# Patient Record
Sex: Female | Born: 1955
Health system: Southern US, Community
[De-identification: ages and names within clinical notes are randomized; demographics above are authoritative.]

## PROBLEM LIST (undated history)

## (undated) DIAGNOSIS — M1990 Unspecified osteoarthritis, unspecified site: Secondary | ICD-10-CM

## (undated) DIAGNOSIS — F32A Depression, unspecified: Secondary | ICD-10-CM

## (undated) DIAGNOSIS — C801 Malignant (primary) neoplasm, unspecified: Secondary | ICD-10-CM

## (undated) DIAGNOSIS — K219 Gastro-esophageal reflux disease without esophagitis: Secondary | ICD-10-CM

## (undated) DIAGNOSIS — F329 Major depressive disorder, single episode, unspecified: Secondary | ICD-10-CM

## (undated) DIAGNOSIS — E669 Obesity, unspecified: Secondary | ICD-10-CM

## (undated) DIAGNOSIS — M7500 Adhesive capsulitis of unspecified shoulder: Secondary | ICD-10-CM

## (undated) DIAGNOSIS — I10 Essential (primary) hypertension: Secondary | ICD-10-CM

## (undated) DIAGNOSIS — T7840XA Allergy, unspecified, initial encounter: Secondary | ICD-10-CM

## (undated) DIAGNOSIS — E119 Type 2 diabetes mellitus without complications: Secondary | ICD-10-CM

## (undated) DIAGNOSIS — J449 Chronic obstructive pulmonary disease, unspecified: Secondary | ICD-10-CM

## (undated) HISTORY — DX: Depression, unspecified: F32.A

## (undated) HISTORY — DX: Unspecified osteoarthritis, unspecified site: M19.90

## (undated) HISTORY — DX: Essential (primary) hypertension: I10

## (undated) HISTORY — DX: Chronic obstructive pulmonary disease, unspecified: J44.9

## (undated) HISTORY — DX: Obesity, unspecified: E66.9

## (undated) HISTORY — PX: LAPAROSCOPIC ABDOMINAL EXPLORATION: SHX6249

## (undated) HISTORY — DX: Adhesive capsulitis of unspecified shoulder: M75.00

## (undated) HISTORY — DX: Gastro-esophageal reflux disease without esophagitis: K21.9

## (undated) HISTORY — PX: OTHER SURGICAL HISTORY: SHX169

## (undated) HISTORY — DX: Allergy, unspecified, initial encounter: T78.40XA

## (undated) HISTORY — DX: Major depressive disorder, single episode, unspecified: F32.9

## (undated) HISTORY — DX: Type 2 diabetes mellitus without complications: E11.9

---

## 1997-12-17 ENCOUNTER — Inpatient Hospital Stay (HOSPITAL_COMMUNITY): Admission: AD | Admit: 1997-12-17 | Discharge: 1997-12-17 | Payer: Self-pay | Admitting: Obstetrics

## 1998-01-14 ENCOUNTER — Inpatient Hospital Stay (HOSPITAL_COMMUNITY): Admission: AD | Admit: 1998-01-14 | Discharge: 1998-01-14 | Payer: Self-pay | Admitting: Obstetrics

## 1998-12-30 ENCOUNTER — Emergency Department (HOSPITAL_COMMUNITY): Admission: EM | Admit: 1998-12-30 | Discharge: 1998-12-30 | Payer: Self-pay | Admitting: *Deleted

## 2013-06-02 ENCOUNTER — Other Ambulatory Visit: Payer: Self-pay | Admitting: Internal Medicine

## 2013-06-02 DIAGNOSIS — Z1231 Encounter for screening mammogram for malignant neoplasm of breast: Secondary | ICD-10-CM

## 2013-12-21 LAB — HM DIABETES EYE EXAM

## 2014-07-21 ENCOUNTER — Encounter: Payer: Self-pay | Admitting: Family Medicine

## 2014-07-21 ENCOUNTER — Ambulatory Visit (INDEPENDENT_AMBULATORY_CARE_PROVIDER_SITE_OTHER): Payer: No Typology Code available for payment source | Admitting: Family Medicine

## 2014-07-21 VITALS — BP 142/100 | HR 82 | Temp 98.4°F | Ht 67.0 in | Wt 259.5 lb

## 2014-07-21 DIAGNOSIS — I1A0 Resistant hypertension: Secondary | ICD-10-CM

## 2014-07-21 DIAGNOSIS — K219 Gastro-esophageal reflux disease without esophagitis: Secondary | ICD-10-CM

## 2014-07-21 DIAGNOSIS — Z7189 Other specified counseling: Secondary | ICD-10-CM

## 2014-07-21 DIAGNOSIS — F329 Major depressive disorder, single episode, unspecified: Secondary | ICD-10-CM

## 2014-07-21 DIAGNOSIS — Z23 Encounter for immunization: Secondary | ICD-10-CM

## 2014-07-21 DIAGNOSIS — F3289 Other specified depressive episodes: Secondary | ICD-10-CM

## 2014-07-21 DIAGNOSIS — F32A Depression, unspecified: Secondary | ICD-10-CM

## 2014-07-21 DIAGNOSIS — Z7689 Persons encountering health services in other specified circumstances: Secondary | ICD-10-CM

## 2014-07-21 DIAGNOSIS — I10 Essential (primary) hypertension: Secondary | ICD-10-CM

## 2014-07-21 DIAGNOSIS — M79601 Pain in right arm: Secondary | ICD-10-CM | POA: Insufficient documentation

## 2014-07-21 DIAGNOSIS — E669 Obesity, unspecified: Secondary | ICD-10-CM

## 2014-07-21 DIAGNOSIS — E119 Type 2 diabetes mellitus without complications: Secondary | ICD-10-CM

## 2014-07-21 DIAGNOSIS — M25519 Pain in unspecified shoulder: Secondary | ICD-10-CM

## 2014-07-21 HISTORY — DX: Type 2 diabetes mellitus without complications: E11.9

## 2014-07-21 HISTORY — DX: Resistant hypertension: I1A.0

## 2014-07-21 HISTORY — DX: Essential (primary) hypertension: I10

## 2014-07-21 HISTORY — DX: Gastro-esophageal reflux disease without esophagitis: K21.9

## 2014-07-21 HISTORY — DX: Obesity, unspecified: E66.9

## 2014-07-21 LAB — BASIC METABOLIC PANEL
BUN: 9 mg/dL (ref 6–23)
CALCIUM: 9.4 mg/dL (ref 8.4–10.5)
CO2: 30 mEq/L (ref 19–32)
Chloride: 98 mEq/L (ref 96–112)
Creatinine, Ser: 0.8 mg/dL (ref 0.4–1.2)
GFR: 91.84 mL/min (ref >60.00)
Glucose, Bld: 121 mg/dL — ABNORMAL HIGH (ref 70–99)
POTASSIUM: 3.5 meq/L (ref 3.5–5.1)
SODIUM: 136 meq/L (ref 135–145)

## 2014-07-21 LAB — LIPID PANEL
Cholesterol: 184 mg/dL (ref 0–200)
HDL: 59.4 mg/dL (ref >39.00)
LDL CALC: 106 mg/dL — AB (ref 0–99)
NONHDL: 124.6
Total CHOL/HDL Ratio: 3
Triglycerides: 92 mg/dL (ref 0.0–149.0)
VLDL: 18.4 mg/dL (ref 0.0–40.0)

## 2014-07-21 LAB — HEMOGLOBIN A1C: Hgb A1c MFr Bld: 6.7 % — ABNORMAL HIGH (ref 4.6–6.5)

## 2014-07-21 MED ORDER — OMEPRAZOLE 40 MG PO CPDR: 40.0000 mg | DELAYED_RELEASE_CAPSULE | Freq: Every day | ORAL | Status: DC

## 2014-07-21 MED ORDER — AMLODIPINE BESYLATE 10 MG PO TABS: 10.0000 mg | ORAL_TABLET | Freq: Every day | ORAL | Status: DC

## 2014-07-21 MED ORDER — CYCLOBENZAPRINE HCL 10 MG PO TABS: 10.0000 mg | ORAL_TABLET | Freq: Every evening | ORAL | Status: DC | PRN

## 2014-07-21 MED ORDER — FLUOXETINE HCL 20 MG PO TABS: 20.0000 mg | ORAL_TABLET | Freq: Two times a day (BID) | ORAL | Status: DC

## 2014-07-21 MED ORDER — LORATADINE 10 MG PO TABS: 10.0000 mg | ORAL_TABLET | Freq: Every day | ORAL | Status: DC

## 2014-07-21 MED ORDER — METOPROLOL TARTRATE 50 MG PO TABS: 50.0000 mg | ORAL_TABLET | Freq: Every day | ORAL | Status: DC

## 2014-07-21 MED ORDER — LOSARTAN POTASSIUM-HCTZ 100-25 MG PO TABS
1.0000 | ORAL_TABLET | Freq: Every day | ORAL | Status: DC
Start: 2014-07-21 — End: 2015-02-03

## 2014-07-21 NOTE — Progress Notes (Signed)
No chief complaint on file.   HPI:  Yvette Roberts is here to establish care.  Has had several family doctors - but not happy with them because per her report they did not help her blood pressure. She has been under a lot of stress with mother's health.  Last PCP and physical: sees Dr. Ruthann Cancer at [facility] obgyn for yearly womens health, Had physical with family med in April.  Has the following chronic problems and concerns today:  Patient Active Problem List   Diagnosis Date Noted  . Type 2 diabetes mellitus without complication 53/61/4431  . Obesity, unspecified 07/21/2014  . Right arm pain 07/21/2014  . Depression 07/21/2014  . Resistant hypertension 07/21/2014  . Esophageal reflux 07/21/2014   HTN: -reports difficult to control -on losartan-hctz 100-25, metoprol 50mg  daily (ran out today), norvasc 10mg  -reports checks BP occ at home, 140/80 -FH HTN  -saw Dr. Gwenlyn Found in cardiology about this in 2013 per her report -denies: CP, SOB, DOE, palpitations, swelling -walking 3 days per week about 1 hour -low sodium, avoids pork, not a lot of red meat  Hx of diabetes: -dx with diabetes in 2012 -was on metformin but then taken off as improved with diet and exercise -no body every told her that cholesterol was a problem -Sees Dr. Einar Gip in optho  R shoulder pain: -occ uses flexeril use for this -takes mothers tramadol and flexeril 1-2 times per month -denies: weakness, numbness, change  Depression: -dx in 2008 -no hx of SI or hospitalization -on prozac since then, increased to 20mg  bid -depressed mood daily mild Depression Symptoms: Sleep disorder: yes Interest deficit/anhedonia: no Guilt (worthlessness, hopelessness, regret): no Energy deficit: no Concentration deficit: no Appetite disorder: no Psychomotor retardation or agitation: no Suicidality: no Irritable at times No counseling  GERD: -wants to start prilosec - was on nexium and did not like it -GERD  symptoms daily -no dysphagia   [name] negative for unless reported above: fevers, unintentional weight loss, hearing or vision loss, chest pain, palpitations, struggling to breath, hemoptysis, melena, hematochezia, hematuria, falls, loc, si, thoughts of self harm  Past Medical History  Diagnosis Date  . Depression   . Hypertension   . Arthritis   . History of elevated glucose   . Allergy   . Type 2 diabetes mellitus without complication 5/40/0867  . Resistant hypertension 07/21/2014  . Obesity, unspecified 07/21/2014  . Esophageal reflux 07/21/2014    No family history on file.  History   Social History  . Marital Status: Divorced    Spouse Name: N/A    Number of Children: N/A  . Years of Education: N/A   Social History Main Topics  . Smoking status: Never Smoker   . Smokeless tobacco: None  . Alcohol Use: None  . Drug Use: None  . Sexual Activity: None   Other Topics Concern  . 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      Home Situation: lives with mother      Spiritual Beliefs: baptist      Lifestyle: regular exercise; diet is good             Current outpatient prescriptions:amLODipine (NORVASC) 10 MG tablet, Take 10 mg by mouth daily., Disp: , Rfl: ;  aspirin 81 MG tablet, Take 81 mg by mouth daily., Disp: , Rfl: ;  cyclobenzaprine (FLEXERIL) 10 MG tablet, Take 10 mg by mouth at bedtime as needed for muscle  spasms., Disp: , Rfl: ;  FLUoxetine (PROZAC) 20 MG capsule, Take 20 mg by mouth daily., Disp: , Rfl: ;  loratadine (CLARITIN) 10 MG tablet, Take 10 mg by mouth daily., Disp: , Rfl:  losartan-hydrochlorothiazide (HYZAAR) 100-25 MG per tablet, Take 1 tablet by mouth daily., Disp: , Rfl: ;  metoprolol (LOPRESSOR) 50 MG tablet, Take 50 mg by mouth daily., Disp: , Rfl:   EXAM:  Filed Vitals:   07/21/14 0923  BP: 142/100  Pulse: 82  Temp: 98.4 F (36.9 C)    Body mass index is 40.63 kg/(m^2).  GENERAL: vitals  reviewed and listed above, alert, oriented, appears well hydrated and in no acute distress  HEENT: atraumatic, conjunttiva clear, no obvious abnormalities on inspection of external nose and ears  NECK: no obvious masses on inspection  LUNGS: clear to auscultation bilaterally, no wheezes, rales or rhonchi, good air movement  CV: HRRR, no peripheral edema  MS: moves all extremities without noticeable abnormality  PSYCH: pleasant and cooperative, no obvious depression or anxiety  ASSESSMENT AND PLAN:  Discussed the following assessment and plan:  Encounter to establish care  Type 2 diabetes mellitus without complication - Plan: Basic metabolic panel, Hemoglobin A1c  Obesity, unspecified - Plan: Lipid panel FASTING labs today  Essential hypertension, benign - Plan: Basic metabolic panel -she did not take her metoprolol today and htinks this is why BP is up, rechecked after sitting and still up -advised starting another medication or changing medications and discussed options - she refused at this time -she opted to restart metoprolol and monitor at home -I advised CLOSE follow up with cuff, log and to recheck in 2 weeks then change medication if still not at goal -advised if we can not help her get her BP to goal then will refer her to a specialist for managment  Right arm pain  Depression -advised counseling, refused as reports does not have time -offered increasing medication, declined -monitor  Flu, pneumococcal, tdap   -We reviewed the PMH, PSH, FH, SH, Meds and Allergies. -We provided refills for any medications we will prescribe as needed. -We addressed current concerns per orders and patient instructions. -We have asked for records for pertinent exams, studies, vaccines and notes from previous providers. -We have advised patient to follow up per instructions below.   -Patient advised to return or notify a doctor immediately if symptoms worsen or persist or new  concerns arise.  Patient Instructions  -We have ordered labs or studies at this visit. It can take up to 1-2 weeks for results and processing. We will contact you with instructions IF your results are abnormal. Normal results will be released to your [facility]. If you have not heard from [place] or can not find your results in [facility] in 2 weeks please contact our office.  -PLEASE SIGN UP FOR MYCHART TODAY   -restart the metoprolol  -start omeprazole (Prilosec) 40 mg daily  -check blood pressure 3 times per week after sitting for 5 minutes and keep log  -follow up in 2-3 weeks - may start a medication called clonidine if needed  We recommend the following healthy lifestyle measures: - eat a healthy diet consisting of lots of vegetables, fruits, beans, nuts, seeds, healthy meats such as white chicken and fish and whole grains.  - avoid fried foods, fast food, processed foods, sodas, red meet and other fattening foods.  - get a least 150 minutes of aerobic exercise per week.   Follow up in: 2-3 weeks  Colin Benton R.

## 2014-07-21 NOTE — Addendum Note (Signed)
Addended by: Agnes Lawrence on: 07/21/2014 11:14 AM   Modules accepted: Orders

## 2014-07-21 NOTE — Progress Notes (Signed)
Pre visit review using our clinic review tool, if applicable. No additional management support is needed unless otherwise documented below in the visit note. 

## 2014-07-21 NOTE — Patient Instructions (Addendum)
-  We have ordered labs or studies at this visit. It can take up to 1-2 weeks for results and processing. We will contact you with instructions IF your results are abnormal. Normal results will be released to your [facility]. If you have not heard from [place] or can not find your results in [facility] in 2 weeks please contact our office.  -PLEASE SIGN UP FOR MYCHART TODAY   -restart the metoprolol  -start omeprazole (Prilosec) 40 mg daily  -check blood pressure 3 times per week after sitting for 5 minutes and keep log  -follow up in 2-3 weeks - may start a medication called clonidine if needed  We recommend the following healthy lifestyle measures: - eat a healthy diet consisting of lots of vegetables, fruits, beans, nuts, seeds, healthy meats such as white chicken and fish and whole grains.  - avoid fried foods, fast food, processed foods, sodas, red meet and other fattening foods.  - get a least 150 minutes of aerobic exercise per week.   Follow up in: 2-3 weeks

## 2014-07-21 NOTE — Addendum Note (Signed)
Addended by: Agnes Lawrence on: 07/21/2014 11:08 AM   Modules accepted: Orders

## 2014-07-22 NOTE — Addendum Note (Signed)
Addended by: Agnes Lawrence on: 07/22/2014 09:06 AM   Modules accepted: Orders

## 2014-08-03 ENCOUNTER — Ambulatory Visit: Payer: No Typology Code available for payment source | Admitting: *Deleted

## 2014-08-04 ENCOUNTER — Encounter: Payer: Self-pay | Admitting: Family Medicine

## 2014-08-04 ENCOUNTER — Ambulatory Visit: Payer: No Typology Code available for payment source | Admitting: Family Medicine

## 2014-08-04 ENCOUNTER — Ambulatory Visit (INDEPENDENT_AMBULATORY_CARE_PROVIDER_SITE_OTHER): Payer: No Typology Code available for payment source | Admitting: Family Medicine

## 2014-08-04 VITALS — BP 136/86 | HR 64 | Temp 98.4°F | Ht 67.0 in | Wt 259.0 lb

## 2014-08-04 DIAGNOSIS — M79601 Pain in right arm: Secondary | ICD-10-CM

## 2014-08-04 DIAGNOSIS — K219 Gastro-esophageal reflux disease without esophagitis: Secondary | ICD-10-CM

## 2014-08-04 DIAGNOSIS — E119 Type 2 diabetes mellitus without complications: Secondary | ICD-10-CM

## 2014-08-04 DIAGNOSIS — E669 Obesity, unspecified: Secondary | ICD-10-CM

## 2014-08-04 DIAGNOSIS — I10 Essential (primary) hypertension: Secondary | ICD-10-CM

## 2014-08-04 DIAGNOSIS — F32A Depression, unspecified: Secondary | ICD-10-CM

## 2014-08-04 DIAGNOSIS — F329 Major depressive disorder, single episode, unspecified: Secondary | ICD-10-CM

## 2014-08-04 NOTE — Progress Notes (Signed)
No chief complaint on file.   HPI:  Yvette Roberts recently established care. Has had several family doctors - but not happy with them because per her report they did not help her blood pressure. She has been under a lot of stress with mother's health.   Last PCP and physical: sees Dr. Ruthann Cancer at [facility] obgyn for yearly womens health, Had physical with family med in April 2015.  Has the following chronic problems and concerns today:   HTN:  -she is happy this is improving -on losartan-hctz 100-25, metoprol 50mg , norvasc 10mg   -FH HTN  -saw Dr. Gwenlyn Found in cardiology about this in 2013 per her report  -denies: CP, SOB, DOE, palpitations, swelling  -working on diet and exercise -low sodium, avoids pork, not a lot of red meat   Hx of diabetes:  -dx with diabetes in 2012  -was on metformin but then taken off as improved with diet and exercise  -Sees Dr. Einar Gip in optho   R shoulder pain:  -occ uses flexeril use for this  -takes mothers tramadol and flexeril 1-2 times per month  -denies: weakness, numbness, change   Depression:  -dx in 2008  -no hx of SI or hospitalization  -on prozac since then, increased to 20mg  bid  -improved, no SI or thoughts of self harm  GERD:  -started Prilosec dialy last visit, improved -occ gas after dairy products -no dysphagia, persistent abd pain, change in bowels  [name]: See pertinent positives and negatives per HPI.  Past Medical History  Diagnosis Date  . Depression   . Arthritis   . Allergy   . Type 2 diabetes mellitus without complication 1/61/0960  . Resistant hypertension 07/21/2014  . Obesity, unspecified 07/21/2014  . Esophageal reflux 07/21/2014    Past Surgical History  Procedure Laterality Date  . Laparoscopic abdominal exploration    . Miscarr      No family history on file.  History   Social History  . Marital Status: Divorced    Spouse Name: N/A    Number of Children: N/A  . Years of Education: N/A   Social  History Main Topics  . Smoking status: Never Smoker   . Smokeless tobacco: None  . Alcohol Use: None  . Drug Use: None  . Sexual Activity: None   Other Topics Concern  . 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      Home Situation: lives with mother      Spiritual Beliefs: baptist      Lifestyle: regular exercise; diet is good             Current outpatient prescriptions:amLODipine (NORVASC) 10 MG tablet, Take 1 tablet (10 mg total) by mouth daily., Disp: 90 tablet, Rfl: 3;  aspirin 81 MG tablet, Take 81 mg by mouth daily., Disp: , Rfl: ;  cyclobenzaprine (FLEXERIL) 10 MG tablet, Take 1 tablet (10 mg total) by mouth at bedtime as needed for muscle spasms., Disp: 90 tablet, Rfl: 3 FLUoxetine (PROZAC) 20 MG tablet, Take 1 tablet (20 mg total) by mouth 2 (two) times daily., Disp: 180 tablet, Rfl: 3;  loratadine (CLARITIN) 10 MG tablet, Take 1 tablet (10 mg total) by mouth daily., Disp: 90 tablet, Rfl: 3;  losartan-hydrochlorothiazide (HYZAAR) 100-25 MG per tablet, Take 1 tablet by mouth daily., Disp: 90 tablet, Rfl: 3;  metoprolol (LOPRESSOR) 50 MG tablet, Take 1 tablet (50 mg total) by mouth daily., Disp: 90 tablet,  Rfl: 3 omeprazole (PRILOSEC) 40 MG capsule, Take 1 capsule (40 mg total) by mouth daily., Disp: 90 capsule, Rfl: 3  EXAM:  Filed Vitals:   08/04/14 0935  BP: 136/86  Pulse: 64  Temp: 98.4 F (36.9 C)    Body mass index is 40.56 kg/(m^2).  GENERAL: vitals reviewed and listed above, alert, oriented, appears well hydrated and in no acute distress  HEENT: atraumatic, conjunttiva clear, no obvious abnormalities on inspection of external nose and ears  NECK: no obvious masses on inspection  LUNGS: clear to auscultation bilaterally, no wheezes, rales or rhonchi, good air movement  ABD: BS+, soft, NTTP  CV: HRRR, no peripheral edema  MS: moves all extremities without noticeable abnormality  PSYCH: pleasant and  cooperative, no obvious depression or anxiety  ASSESSMENT AND PLAN:  Discussed the following assessment and plan:  Type 2 diabetes mellitus without complication -she is working on diet and exercise for this -follow up in 3 months  Obesity -stressed importance of healthy diet and regular exercise  Right arm pain -tylenol, follow up if persists or worsens  Depression -improved  Resistant hypertension -improved, continue current medications, diet and exercise  Gastroesophageal reflux disease, esophagitis presence not specified  -Patient advised to return or notify a doctor immediately if symptoms worsen or persist or new concerns arise.  Patient Instructions  -continue to work on getting at least 150 minutes of cardiovascular exercise daily and eat a healthy diet  -can use tylenol 500-1000mg  up to twice daily for the shoulder and let us know if not working  -please let us know if gas not improving  -schedule morning appointment in 3 months and come fasting to that appointment and we will check labs that day     KIM, Jarrett Soho R.

## 2014-08-04 NOTE — Progress Notes (Signed)
Pre visit review using our clinic review tool, if applicable. No additional management support is needed unless otherwise documented below in the visit note. 

## 2014-08-04 NOTE — Patient Instructions (Signed)
-  continue to work on getting at least 150 minutes of cardiovascular exercise daily and eat a healthy diet  -can use tylenol 500-1000mg  up to twice daily for the shoulder and let us know if not working  -please let us know if gas not improving  -schedule morning appointment in 3 months and come fasting to that appointment and we will check labs that day

## 2014-08-05 ENCOUNTER — Telehealth: Payer: Self-pay | Admitting: Family Medicine

## 2014-08-05 NOTE — Telephone Encounter (Signed)
emmi mailed  °

## 2014-08-08 ENCOUNTER — Telehealth: Payer: Self-pay | Admitting: Family Medicine

## 2014-08-08 NOTE — Telephone Encounter (Signed)
Patient Information:  Caller Name: Johnae  Phone: [phone]  Patient: Yvette Roberts, Yvette Roberts  Gender: Female  DOB: Aug 18, 1956  Age: 58 Years  PCP: Maudie Mercury (TEXT 1st, after 20 mins can call), Jarrett Soho [facility])  Office Follow Up:  Does the office need to follow up with this patient?: No  Instructions For The Office: N/A  RN Note:  Advised Simple Saline, salt water gargles, cough drops, and Coricidin HB Cough and Cold  Symptoms  Reason For Call & Symptoms: Calling about Cold sx; runny nose- watery eyes, congestion, scratchy throat, occasional dry cough- onset 08/05/14. Afebrile. She had flu and pneumonia shot 2 weeks ago. She is wondering what she can take that will help with sx.  Reviewed Health History In EMR: Yes  Reviewed Medications In EMR: Yes  Reviewed Allergies In EMR: Yes  Reviewed Surgeries / Procedures: Yes  Date of Onset of Symptoms: 08/05/2014  Treatments Tried: Tylenol 500 mgs 2 tabs PO every 6hr  Treatments Tried Worked: No  Guideline(s) Used:  Colds  Disposition Per Guideline:   Home Care  Reason For Disposition Reached:   Colds with no complications  Advice Given:  Reassurance  It sounds like an uncomplicated cold that we can treat at home.  Colds are very common and may make you feel uncomfortable.  Colds are caused by viruses, and no medicine or "shot" will cure an uncomplicated cold.  Treatment for Associated Symptoms of Colds:  Sore throat: Try throat lozenges, hard candy, or warm chicken broth.  Cough: Use cough drops.  For a Runny Nose With Profuse Discharge:   Nasal mucus and discharge helps to wash viruses and bacteria out of the nose and sinuses.  Blowing the nose is all that is needed.  If the skin around your nostrils gets irritated, apply a tiny amount of petroleum ointment to the nasal openings once or twice a day.  For a Stuffy Nose - Use Nasal Washes:  Introduction: Saline (salt water) nasal irrigation (nasal wash) is an effective and simple  home remedy for treating stuffy nose and sinus congestion. The nose can be irrigated by pouring, spraying, or squirting salt water into the nose and then letting it run back out.  How it Helps: The salt water rinses out excess mucus, washes out any irritants (dust, allergens) that might be present, and moistens the nasal cavity.  Humidifier:  If the air in your home is dry, use a cool-mist humidifier  Contagiousness:  The cold virus is present in your nasal secretions.  Cover your nose and mouth with a tissue when you sneeze or cough.  Wash your hands frequently with soap and water.  You can return to work or school after the fever is gone and you feel well enough to participate in normal activities.  Expected Course:   Fever may last 2-3 days  Nasal discharge 7-14 days  Cough up to 2-3 weeks.  Call Back If:  Difficulty breathing occurs  Fever lasts more than 3 days  Nasal discharge lasts more than 10 days  Cough lasts more than 3 weeks  You become worse  Caution - Nasal Decongestants:  Do not take these medications if you have high blood pressure, heart disease, prostate problems, or an overactive thyroid.  Do not take these medications if you have used an MAO inhibitor such as isocarboxazid (Marplan), phenelzine (Nardil), rasagiline (Azilect), selegiline (Eldepryl, Emsam), or tranylcypromine (Parnate) in the past 2 weeks. Life-threatening side effects can occur.  Cough Medicines:  OTC  Cough Drops: Cough drops can help a lot, especially for mild coughs. They reduce coughing by soothing your irritated throat and removing that tickle sensation in the back of the throat. Cough drops also have the advantage of portability - you can carry them with you.  Pain and Fever Medicines:  For pain or fever relief, take either acetaminophen or ibuprofen.  Patient Will Follow Care Advice:  YES

## 2014-08-08 NOTE — Telephone Encounter (Signed)
Noted  

## 2014-11-04 ENCOUNTER — Ambulatory Visit (INDEPENDENT_AMBULATORY_CARE_PROVIDER_SITE_OTHER): Payer: 59 | Admitting: Family Medicine

## 2014-11-04 ENCOUNTER — Encounter: Payer: Self-pay | Admitting: Family Medicine

## 2014-11-04 VITALS — BP 122/84 | HR 62 | Temp 98.0°F | Ht 67.0 in | Wt 253.0 lb

## 2014-11-04 DIAGNOSIS — E669 Obesity, unspecified: Secondary | ICD-10-CM

## 2014-11-04 DIAGNOSIS — K219 Gastro-esophageal reflux disease without esophagitis: Secondary | ICD-10-CM

## 2014-11-04 DIAGNOSIS — E119 Type 2 diabetes mellitus without complications: Secondary | ICD-10-CM

## 2014-11-04 DIAGNOSIS — I10 Essential (primary) hypertension: Secondary | ICD-10-CM

## 2014-11-04 DIAGNOSIS — F329 Major depressive disorder, single episode, unspecified: Secondary | ICD-10-CM

## 2014-11-04 DIAGNOSIS — F32A Depression, unspecified: Secondary | ICD-10-CM

## 2014-11-04 DIAGNOSIS — L509 Urticaria, unspecified: Secondary | ICD-10-CM

## 2014-11-04 LAB — BASIC METABOLIC PANEL
BUN: 7 mg/dL (ref 6–23)
CO2: 31 mEq/L (ref 19–32)
Calcium: 8.9 mg/dL (ref 8.4–10.5)
Chloride: 98 mEq/L (ref 96–112)
Creatinine, Ser: 0.8 mg/dL (ref 0.4–1.2)
GFR: 97.2 mL/min (ref >60.00)
Glucose, Bld: 120 mg/dL — ABNORMAL HIGH (ref 70–99)
Potassium: 3.1 mEq/L — ABNORMAL LOW (ref 3.5–5.1)
SODIUM: 137 meq/L (ref 135–145)

## 2014-11-04 LAB — LIPID PANEL
Cholesterol: 191 mg/dL (ref 0–200)
HDL: 47.6 mg/dL (ref >39.00)
LDL CALC: 125 mg/dL — AB (ref 0–99)
NonHDL: 143.4
Total CHOL/HDL Ratio: 4
Triglycerides: 91 mg/dL (ref 0.0–149.0)
VLDL: 18.2 mg/dL (ref 0.0–40.0)

## 2014-11-04 LAB — HEMOGLOBIN A1C: Hgb A1c MFr Bld: 6.9 % — ABNORMAL HIGH (ref 4.6–6.5)

## 2014-11-04 NOTE — Progress Notes (Signed)
Pre visit review using our clinic review tool, if applicable. No additional management support is needed unless otherwise documented below in the visit note. 

## 2014-11-04 NOTE — Addendum Note (Signed)
Addended by: [name] on: 11/04/2014 09:29 AM   Modules accepted: Orders

## 2014-11-04 NOTE — Progress Notes (Signed)
HPI:  HTN:  -meds: losartan-hctz 100-25, metoprol 50mg , norvasc 10mg   -FH HTN  -saw Dr. Gwenlyn Found in cardiology about this in 2013 per her report  -denies: CP, SOB, DOE, palpitations, swelling  -working on diet and exercise -low sodium, avoids pork, not a lot of red meat   Hx of diabetes:  -dx with diabetes in 2012  -was on metformin but then taken off as improved with diet and exercise  -Sees Dr. Einar Gip in optho  -denies: polyuria, polydipsia, wounds, vision changes  R shoulder pain:  -occ uses flexeril use for this  -takes mothers tramadol and flexeril 1-2 times per month  -denies: weakness, numbness, change   Depression/anxiety:  -dx in 2008  -no hx of SI or hospitalization  -on prozac since then, increased to 20mg  bid  -improved, no SI or thoughts of self harm  GERD:  -meds prilosec daily -occ gas after dairy products -no dysphagia, persistent abd pain, change in bowels  Hives: -several months ago, hive - itchy welps -mother allergic to peanuts -taking benadryl and zyrtec - thinks was food related -no outbreaks since -no hx urticaria in the past, no hx allergy testing   [name]: See pertinent positives and negatives per HPI.  Past Medical History  Diagnosis Date  . Depression   . Arthritis   . Allergy   . Type 2 diabetes mellitus without complication 06/08/7516  . Resistant hypertension 07/21/2014  . Obesity, unspecified 07/21/2014  . Esophageal reflux 07/21/2014    Past Surgical History  Procedure Laterality Date  . Laparoscopic abdominal exploration    . Miscarr      No family history on file.  History   Social History  . Marital Status: Divorced    Spouse Name: N/A    Number of Children: N/A  . Years of Education: N/A   Social History Main Topics  . Smoking status: Never Smoker   . Smokeless tobacco: None  . Alcohol Use: None  . Drug Use: None  . Sexual Activity: None   Other Topics Concern  . 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      Home Situation: lives with mother      Spiritual Beliefs: baptist      Lifestyle: regular exercise; diet is good             Current outpatient prescriptions: amLODipine (NORVASC) 10 MG tablet, Take 1 tablet (10 mg total) by mouth daily., Disp: 90 tablet, Rfl: 3;  aspirin 81 MG tablet, Take 81 mg by mouth daily., Disp: , Rfl: ;  cyclobenzaprine (FLEXERIL) 10 MG tablet, Take 1 tablet (10 mg total) by mouth at bedtime as needed for muscle spasms., Disp: 90 tablet, Rfl: 3 FLUoxetine (PROZAC) 20 MG tablet, Take 1 tablet (20 mg total) by mouth 2 (two) times daily., Disp: 180 tablet, Rfl: 3;  losartan-hydrochlorothiazide (HYZAAR) 100-25 MG per tablet, Take 1 tablet by mouth daily., Disp: 90 tablet, Rfl: 3;  metoprolol (LOPRESSOR) 50 MG tablet, Take 1 tablet (50 mg total) by mouth daily., Disp: 90 tablet, Rfl: 3;  omeprazole (PRILOSEC) 40 MG capsule, Take 1 capsule (40 mg total) by mouth daily., Disp: 90 capsule, Rfl: 3  EXAM:  Filed Vitals:   11/04/14 0846  BP: 122/84  Pulse: 62  Temp: 98 F (36.7 C)    Body mass index is 39.62 kg/(m^2).  GENERAL: vitals reviewed and listed above, alert, oriented, appears well hydrated and in no  acute distress  HEENT: atraumatic, conjunttiva clear, no obvious abnormalities on inspection of external nose and ears  NECK: no obvious masses on inspection  LUNGS: clear to auscultation bilaterally, no wheezes, rales or rhonchi, good air movement  CV: HRRR, no peripheral edema  MS: moves all extremities without noticeable abnormality  PSYCH: pleasant and cooperative, no obvious depression or anxiety  ASSESSMENT AND PLAN:  Discussed the following assessment and plan:  Type 2 diabetes mellitus without complication  Obesity  Depression  Resistant hypertension  Gastroesophageal reflux disease, esophagitis presence not specified  Hives  -FASTING LABs -foot exam -HM:  colonoscopy, mammogram and pap advised - reports mammo and pap done with gyn in last year - updated per her report -lifestyle recs -follow up in 3 months -zyrtec daily and allergy testing for hx ? Hives, emergent precautions -Patient advised to return or notify a doctor immediately if symptoms worsen or persist or new concerns arise.  Patient Instructions  BEFORE YOU LEAVE: -labs   Call and set up appointment with allergist for testing  Follow up in 3 months  Let us know when you want to do your colonoscopy      Colin Benton R.

## 2014-11-04 NOTE — Addendum Note (Signed)
Addended by: Elmer Picker on: 11/04/2014 09:31 AM   Modules accepted: Orders

## 2014-11-04 NOTE — Patient Instructions (Signed)
BEFORE YOU LEAVE: -labs   Call and set up appointment with allergist for testing  Follow up in 3 months  Let us know when you want to do your colonoscopy

## 2014-11-07 ENCOUNTER — Other Ambulatory Visit: Payer: Self-pay | Admitting: *Deleted

## 2014-11-07 DIAGNOSIS — E876 Hypokalemia: Secondary | ICD-10-CM

## 2014-12-08 ENCOUNTER — Other Ambulatory Visit (INDEPENDENT_AMBULATORY_CARE_PROVIDER_SITE_OTHER): Payer: 59

## 2014-12-08 DIAGNOSIS — E876 Hypokalemia: Secondary | ICD-10-CM

## 2014-12-08 LAB — BASIC METABOLIC PANEL
BUN: 11 mg/dL (ref 6–23)
CHLORIDE: 99 meq/L (ref 96–112)
CO2: 34 mEq/L — ABNORMAL HIGH (ref 19–32)
Calcium: 9 mg/dL (ref 8.4–10.5)
Creatinine, Ser: 0.76 mg/dL (ref 0.40–1.20)
GFR: 100.12 mL/min (ref >60.00)
Glucose, Bld: 126 mg/dL — ABNORMAL HIGH (ref 70–99)
POTASSIUM: 3 meq/L — AB (ref 3.5–5.1)
Sodium: 137 mEq/L (ref 135–145)

## 2014-12-12 ENCOUNTER — Other Ambulatory Visit: Payer: Self-pay | Admitting: Family Medicine

## 2014-12-12 ENCOUNTER — Other Ambulatory Visit: Payer: Self-pay | Admitting: *Deleted

## 2014-12-12 DIAGNOSIS — E876 Hypokalemia: Secondary | ICD-10-CM

## 2014-12-12 MED ORDER — POTASSIUM CHLORIDE ER 20 MEQ PO TBCR: 20.0000 meq | EXTENDED_RELEASE_TABLET | Freq: Every day | ORAL | Status: DC

## 2015-01-10 ENCOUNTER — Other Ambulatory Visit: Payer: 59

## 2015-01-17 ENCOUNTER — Other Ambulatory Visit (INDEPENDENT_AMBULATORY_CARE_PROVIDER_SITE_OTHER): Payer: 59

## 2015-01-17 DIAGNOSIS — E876 Hypokalemia: Secondary | ICD-10-CM

## 2015-01-17 LAB — BASIC METABOLIC PANEL
BUN: 8 mg/dL (ref 6–23)
CO2: 33 mEq/L — ABNORMAL HIGH (ref 19–32)
Calcium: 9.1 mg/dL (ref 8.4–10.5)
Chloride: 100 mEq/L (ref 96–112)
Creatinine, Ser: 0.76 mg/dL (ref 0.40–1.20)
GFR: 100.09 mL/min (ref >60.00)
GLUCOSE: 123 mg/dL — AB (ref 70–99)
POTASSIUM: 3.3 meq/L — AB (ref 3.5–5.1)
SODIUM: 137 meq/L (ref 135–145)

## 2015-02-03 ENCOUNTER — Ambulatory Visit (INDEPENDENT_AMBULATORY_CARE_PROVIDER_SITE_OTHER): Payer: 59 | Admitting: Family Medicine

## 2015-02-03 ENCOUNTER — Encounter: Payer: Self-pay | Admitting: Family Medicine

## 2015-02-03 VITALS — BP 138/86 | HR 62 | Temp 98.3°F | Ht 67.0 in | Wt 254.5 lb

## 2015-02-03 DIAGNOSIS — E119 Type 2 diabetes mellitus without complications: Secondary | ICD-10-CM

## 2015-02-03 DIAGNOSIS — E669 Obesity, unspecified: Secondary | ICD-10-CM

## 2015-02-03 DIAGNOSIS — M25512 Pain in left shoulder: Secondary | ICD-10-CM

## 2015-02-03 DIAGNOSIS — F329 Major depressive disorder, single episode, unspecified: Secondary | ICD-10-CM

## 2015-02-03 DIAGNOSIS — F32A Depression, unspecified: Secondary | ICD-10-CM

## 2015-02-03 DIAGNOSIS — I10 Essential (primary) hypertension: Secondary | ICD-10-CM | POA: Diagnosis not present

## 2015-02-03 DIAGNOSIS — M25511 Pain in right shoulder: Secondary | ICD-10-CM

## 2015-02-03 MED ORDER — FLUOXETINE HCL 20 MG PO TABS
40.0000 mg | ORAL_TABLET | Freq: Every day | ORAL | Status: DC
Start: 2015-02-03 — End: 2016-02-07

## 2015-02-03 MED ORDER — CARVEDILOL 6.25 MG PO TABS: 6.2500 mg | ORAL_TABLET | Freq: Two times a day (BID) | ORAL | Status: DC

## 2015-02-03 MED ORDER — LOSARTAN POTASSIUM 100 MG PO TABS: 100.0000 mg | ORAL_TABLET | Freq: Every day | ORAL | Status: DC

## 2015-02-03 NOTE — Progress Notes (Signed)
HPI:  HTN:  -meds: losartan-hctz 100-25, metoprol 50mg , norvasc 10mg   -FH HTN  -saw Dr. Gwenlyn Found in cardiology about this in 2013 per her report  -denies: CP, SOB, DOE, palpitations, swelling  -working on diet and exercise -low sodium, avoids pork, not a lot of red meat  -had some hypokal on hctz and today reports wants to stop the hctz as doesn't like taking potassium  HLD: -advised statin discussion this visit and lifestyle recs -she prefers to do aggressive lifestyle changes and then start cholesterol -denies hx os statin intol  Hx of diabetes:  -dx with diabetes in 2012  -was on metformin but then taken off as improved with diet and exercise  -Sees Dr. Einar Gip in optho  -denies: polyuria, polydipsia, wounds, vision changes  R shoulder pain:  -now worsening and now in both shoulders, has intermittent flares of pain in bilat shoulders, worse with combing hair and raising arms -occ uses flexeril use for this  -uses tylenol 500mg  and this helps some -denies: weakness, numbness, fevers, other joint issues  Depression/anxiety:  -dx in 2008  -no hx of SI or hospitalization  -on prozac since then, increased to 20mg  bid  -improved, no SI or thoughts of self harm  GERD:  -meds prilosec daily -occ gas after dairy products -no dysphagia, persistent abd pain, change in bowels  Hives: -several months ago, hive - itchy welps -mother allergic to peanuts -taking benadryl and zyrtec - thinks was food related -no outbreaks since -no hx urticaria in the past, no hx allergy testing   [name]: See pertinent positives and negatives per HPI.  Past Medical History  Diagnosis Date  . Depression   . Arthritis   . Allergy   . Type 2 diabetes mellitus without complication 5/95/6387  . Resistant hypertension 07/21/2014  . Obesity, unspecified 07/21/2014  . Esophageal reflux 07/21/2014    Past Surgical History  Procedure Laterality Date  . Laparoscopic abdominal  exploration    . Miscarr      No family history on file.  History   Social History  . Marital Status: Divorced    Spouse Name: N/A  . Number of Children: N/A  . Years of Education: N/A   Social History Main Topics  . Smoking status: Never Smoker   . Smokeless tobacco: Not on file  . Alcohol Use: Not on file  . Drug Use: Not on file  . Sexual Activity: Not on file   Other Topics Concern  . 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  amLODipine (NORVASC) 10 MG tablet, Take 1 tablet (10 mg total) by mouth daily., Disp: 90 tablet, Rfl: 3 .  aspirin 81 MG tablet, Take 81 mg by mouth daily., Disp: , Rfl:  .  cyclobenzaprine (FLEXERIL) 10 MG tablet, Take 1 tablet (10 mg total) by mouth at bedtime as needed for muscle spasms., Disp: 90 tablet, Rfl: 3 .  FLUoxetine (PROZAC) 20 MG tablet, Take 2 tablets (40 mg total) by mouth daily., Disp: 180 tablet, Rfl: 3 .  omeprazole (PRILOSEC) 40 MG capsule, Take 1 capsule (40 mg total) by mouth daily., Disp: 90 capsule, Rfl: 3 .  carvedilol (COREG) 6.25  MG tablet, Take 1 tablet (6.25 mg total) by mouth 2 (two) times daily with a meal., Disp: 60 tablet, Rfl: 3 .  losartan (COZAAR) 100 MG tablet, Take 1 tablet (100 mg total) by mouth daily., Disp: 90 tablet, Rfl: 1  EXAM:  Filed Vitals:   02/03/15 0851  BP: 138/86  Pulse: 62  Temp: 98.3 F (36.8 C)    Body mass index is 39.85 kg/(m^2).  GENERAL: vitals reviewed and listed above, alert, oriented, appears well hydrated and in no acute distress  HEENT: atraumatic, conjunttiva clear, no obvious abnormalities on inspection of external nose and ears  NECK: no obvious masses on inspection  LUNGS: clear to auscultation bilaterally, no wheezes, rales or rhonchi, good air  movement  CV: HRRR, no peripheral edema  MS: moves all extremities without noticeable abnormality, ROM abd and ext limited in both shoulders due to pain, TTP in RTC tendons bilat - worse on the L today, pain with exteral rot, flexion and ext of shoulders, normal strength and sensation in bilat UE, normal radial pulses bilat, neg empty can but does have pain L, +impringement test bilat, + shawls singn L but no pain of L AC jt, neg speeds  PSYCH: pleasant and cooperative, no obvious depression or anxiety  ASSESSMENT AND PLAN:  Discussed the following assessment and plan:  Type 2 diabetes mellitus without complication - well controlled  Depression -stable but she has increased dose of her medication, sent new dose  Resistant hypertension -she now wants to stop the hctz because does not like taking potassium -see changes per instructions: stop combo, cont losartan and norvasc, change metop to coreg and then titrate coreg as needed  Obesity -lifestyle recs  Shoulder pain, bilateral - Plan: Ambulatory referral to Sports Medicine -query RTC tendionpathy, she is very irritated by this -will start HEP and supportive tx for pain control - she has report hx allergy to NSAIDs -referral to Dr. Tamala Julian for eval  -Patient advised to return or notify a doctor immediately if symptoms worsen or persist or new concerns arise.  Patient Instructions  BEFORE YOUR LEAVE: -schedule follow up in 1 month - come FASTING, drink plenty of water -rotator cuff exercises  -We placed a referral for you as discussed to Dr. Tamala Julian in sports medicine for the shoulder pain. It usually takes about 1-2 weeks to process and schedule this referral. If you have not heard from [place] regarding this appointment in 2 weeks please contact our office.  -Int the interim start the exercises for the shoulder 4 days per week and try tylenol up to 1000mg  3 times per day and topical sports creams  -stop hydrochlorthiazide combo and  contin losartan 100mg  -continue norvasc 10mg  -stop metoprolol and instead start coreg 6.25 mg twice daily -stop the potassium  You have decided to work on diet and exercise aggressively to treat the cholesterol COME FASTING to your appointment in 1 month and we will check labs      [place], [place]

## 2015-02-03 NOTE — Patient Instructions (Addendum)
BEFORE YOUR LEAVE: -schedule follow up in 1 month - come FASTING, drink plenty of water -rotator cuff exercises  -We placed a referral for you as discussed to Dr. Tamala Julian in sports medicine for the shoulder pain. It usually takes about 1-2 weeks to process and schedule this referral. If you have not heard from [place] regarding this appointment in 2 weeks please contact our office.  -Int the interim start the exercises for the shoulder 4 days per week and try tylenol up to 1000mg  3 times per day and topical sports creams  -stop hydrochlorthiazide combo and contin losartan 100mg  -continue norvasc 10mg  -stop metoprolol and instead start coreg 6.25 mg twice daily -stop the potassium  You have decided to work on diet and exercise aggressively to treat the cholesterol COME FASTING to your appointment in 1 month and we will check labs

## 2015-02-03 NOTE — Progress Notes (Signed)
Pre visit review using our clinic review tool, if applicable. No additional management support is needed unless otherwise documented below in the visit note. 

## 2015-02-15 ENCOUNTER — Ambulatory Visit: Payer: 59 | Admitting: Family Medicine

## 2015-02-21 ENCOUNTER — Ambulatory Visit: Payer: 59 | Admitting: Family Medicine

## 2015-03-06 ENCOUNTER — Encounter: Payer: Self-pay | Admitting: Family Medicine

## 2015-03-06 ENCOUNTER — Ambulatory Visit (INDEPENDENT_AMBULATORY_CARE_PROVIDER_SITE_OTHER): Payer: 59 | Admitting: Family Medicine

## 2015-03-06 VITALS — BP 140/90 | HR 84 | Temp 98.2°F | Ht 67.0 in | Wt 252.0 lb

## 2015-03-06 DIAGNOSIS — E785 Hyperlipidemia, unspecified: Secondary | ICD-10-CM

## 2015-03-06 DIAGNOSIS — Z6839 Body mass index (BMI) 39.0-39.9, adult: Secondary | ICD-10-CM

## 2015-03-06 DIAGNOSIS — R350 Frequency of micturition: Secondary | ICD-10-CM

## 2015-03-06 DIAGNOSIS — E119 Type 2 diabetes mellitus without complications: Secondary | ICD-10-CM

## 2015-03-06 DIAGNOSIS — I10 Essential (primary) hypertension: Secondary | ICD-10-CM | POA: Diagnosis not present

## 2015-03-06 LAB — LIPID PANEL
CHOL/HDL RATIO: 3
Cholesterol: 178 mg/dL (ref 0–200)
HDL: 60.5 mg/dL (ref >39.00)
LDL Cholesterol: 103 mg/dL — ABNORMAL HIGH (ref 0–99)
NonHDL: 117.5
Triglycerides: 75 mg/dL (ref 0.0–149.0)
VLDL: 15 mg/dL (ref 0.0–40.0)

## 2015-03-06 LAB — BASIC METABOLIC PANEL
BUN: 8 mg/dL (ref 6–23)
CO2: 32 mEq/L (ref 19–32)
CREATININE: 0.63 mg/dL (ref 0.40–1.20)
Calcium: 9.3 mg/dL (ref 8.4–10.5)
Chloride: 101 mEq/L (ref 96–112)
GFR: 124.22 mL/min (ref >60.00)
Glucose, Bld: 108 mg/dL — ABNORMAL HIGH (ref 70–99)
Potassium: 3.1 mEq/L — ABNORMAL LOW (ref 3.5–5.1)
Sodium: 139 mEq/L (ref 135–145)

## 2015-03-06 LAB — POCT URINALYSIS DIPSTICK
Bilirubin, UA: NEGATIVE
Blood, UA: NEGATIVE
GLUCOSE UA: NEGATIVE
Ketones, UA: NEGATIVE
Leukocytes, UA: NEGATIVE
Nitrite, UA: NEGATIVE
Protein, UA: NEGATIVE
Spec Grav, UA: 1.015
UROBILINOGEN UA: 1
pH, UA: 7

## 2015-03-06 LAB — HEMOGLOBIN A1C: Hgb A1c MFr Bld: 6.5 % (ref 4.6–6.5)

## 2015-03-06 MED ORDER — CARVEDILOL 12.5 MG PO TABS: 12.5000 mg | ORAL_TABLET | Freq: Two times a day (BID) | ORAL | Status: DC

## 2015-03-06 NOTE — Progress Notes (Signed)
HPI:  Here for follow up and labs:  HTN: changed up meds last visit as she did not want to take hctz due to hypokalemia -meds: norvasc 10, coreg 6.25, losartan 100  HLD/Obesity: -advised statin  -she prefered to do aggressive lifestyle changes and then start cholesterol -reports regular exercise and healthy diet -denies hx os statin intol  Hx of diabetes:  -dx with diabetes in 2012  -was on metformin but then taken off as improved with diet and exercise  -Sees Dr. Einar Gip in optho  -denies: polyuria, wounds, vision changes -she has had some increased urine frequency at night, but drink a lot of water in the evening and at night since getting fountain in house - denies dysuria, hematuria, flank pain, polydipsia  HM: -hiv, colon, optho  [name]: See pertinent positives and negatives per HPI.  Past Medical History  Diagnosis Date  . Depression   . Arthritis   . Allergy   . Type 2 diabetes mellitus without complication 01/23/9241  . Resistant hypertension 07/21/2014  . Obesity, unspecified 07/21/2014  . Esophageal reflux 07/21/2014    Past Surgical History  Procedure Laterality Date  . Laparoscopic abdominal exploration    . Miscarr      No family history on file.  History   Social History  . Marital Status: Divorced    Spouse Name: N/A  . Number of Children: N/A  . Years of Education: N/A   Social History Main Topics  . Smoking status: Never Smoker   . Smokeless tobacco: Not on file  . Alcohol Use: Not on file  . Drug Use: Not on file  . Sexual Activity: Not on file   Other Topics Concern  . 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  amLODipine (NORVASC) 10 MG tablet, Take 1 tablet (10 mg total) by mouth daily., Disp: 90 tablet, Rfl:  3 .  aspirin 81 MG tablet, Take 81 mg by mouth daily., Disp: , Rfl:  .  carvedilol (COREG) 12.5 MG tablet, Take 1 tablet (12.5 mg total) by mouth 2 (two) times daily with a meal., Disp: 60 tablet, Rfl: 3 .  FLUoxetine (PROZAC) 20 MG tablet, Take 2 tablets (40 mg total) by mouth daily., Disp: 180 tablet, Rfl: 3 .  losartan (COZAAR) 100 MG tablet, Take 1 tablet (100 mg total) by mouth daily., Disp: 90 tablet, Rfl: 1 .  omeprazole (PRILOSEC) 40 MG capsule, Take 1 capsule (40 mg total) by mouth daily., Disp: 90 capsule, Rfl: 3  EXAM:  Filed Vitals:   03/06/15 0855  BP: 140/90  Pulse: 84  Temp: 98.2 F (36.8 C)    Body mass index is 39.46 kg/(m^2).  GENERAL: vitals reviewed and listed above, alert, oriented, appears well hydrated and in no acute distress  HEENT: atraumatic, conjunttiva clear, no obvious abnormalities on inspection of external nose and ears  NECK: no obvious masses on inspection  LUNGS: clear to auscultation bilaterally, no wheezes, rales or rhonchi, good air movement  CV: HRRR, no peripheral edema  MS: moves all extremities without noticeable abnormality  PSYCH: pleasant and cooperative, no obvious depression or anxiety  ASSESSMENT AND PLAN:  Discussed the following assessment  and plan:  Type 2 diabetes mellitus without complication - Plan: Hemoglobin A1c  Resistant hypertension - Plan: Basic metabolic panel  Hyperlipidemia - Plan: Lipid Panel  BMI 39.0-39.9,adult  Urinary frequency  -increase coreg -lifestyle recs -FASTING labs today -follow up in 2-3 months -oday she reports she is ok with doing metformin or statin if needed -advised eye exam and colonsocopy - refused colon ca screening -Patient advised to return or notify a doctor immediately if symptoms worsen or persist or new concerns arise.  Patient Instructions  BEFORE YOU LEAVE: -labs -urine dip -follow up appointment in 3 months  Schedule diabetic eye exam and have your eye doctor fax  report to [place]  Let us know if you want to do your colonoscopy  Please increase the coreg to 12.5 daily  Cut out fluids right before bed - call if urinary frequency persists     Kirk Sampley R.

## 2015-03-06 NOTE — Addendum Note (Signed)
Addended by: Agnes Lawrence on: 03/06/2015 09:25 AM   Modules accepted: Orders

## 2015-03-06 NOTE — Progress Notes (Signed)
Pre visit review using our clinic review tool, if applicable. No additional management support is needed unless otherwise documented below in the visit note. 

## 2015-03-06 NOTE — Addendum Note (Signed)
Addended by: Agnes Lawrence on: 03/06/2015 03:49 PM   Modules accepted: Orders

## 2015-03-06 NOTE — Patient Instructions (Signed)
BEFORE YOU LEAVE: -labs -urine dip -follow up appointment in 3 months  Schedule diabetic eye exam and have your eye doctor fax report to [place]  Let us know if you want to do your colonoscopy  Please increase the coreg to 12.5 daily  Cut out fluids right before bed - call if urinary frequency persists

## 2015-03-21 ENCOUNTER — Other Ambulatory Visit: Payer: Self-pay | Admitting: Family Medicine

## 2015-03-21 DIAGNOSIS — Z1239 Encounter for other screening for malignant neoplasm of breast: Secondary | ICD-10-CM

## 2015-05-10 ENCOUNTER — Ambulatory Visit (INDEPENDENT_AMBULATORY_CARE_PROVIDER_SITE_OTHER): Payer: 59

## 2015-05-10 ENCOUNTER — Encounter: Payer: Self-pay | Admitting: Family Medicine

## 2015-05-10 ENCOUNTER — Ambulatory Visit (INDEPENDENT_AMBULATORY_CARE_PROVIDER_SITE_OTHER): Payer: 59 | Admitting: Family Medicine

## 2015-05-10 VITALS — BP 138/86 | HR 66 | Ht 67.0 in | Wt 248.0 lb

## 2015-05-10 DIAGNOSIS — M7500 Adhesive capsulitis of unspecified shoulder: Secondary | ICD-10-CM | POA: Insufficient documentation

## 2015-05-10 DIAGNOSIS — M79602 Pain in left arm: Secondary | ICD-10-CM

## 2015-05-10 DIAGNOSIS — M7501 Adhesive capsulitis of right shoulder: Secondary | ICD-10-CM | POA: Diagnosis not present

## 2015-05-10 HISTORY — DX: Adhesive capsulitis of unspecified shoulder: M75.00

## 2015-05-10 NOTE — Patient Instructions (Addendum)
Good to see you.  Ice 20 minutes 2 times daily. Usually after activity and before bed. Exercises 3 times a week to work on improving motion  pennsaid pinkie amount topically 2 times daily as needed.  Add Vitamin D 2000units/day  Can take Tylenol or Ibuprofen as needed for pain See me again in 4 weeks.

## 2015-05-10 NOTE — Progress Notes (Signed)
[facility] [address] [place], [place] [address] Phone: [phone] Subjective:    I'm seeing this patient by the request  of:  [name]., DO   CC: bilateral and left arm pain  WCB:Yvette Roberts is a 59 y.o. female coming in with complaint of  Left arm pain and shoulder pain being the worse. Patient is starting have right-sided pain but left is significantly worse. Has been going on for approximately 3 months. Patient states that she is actually losing range of motion. Patient states that there is some radiation down the arm. Denies any neck pain. Rates the severity of pain 8 out of 10. Sometimes can wake her up at night.  Past Medical History  Diagnosis Date  . Depression   . Arthritis   . Allergy   . Type 2 diabetes mellitus without complication 1/60/7371  . Resistant hypertension 07/21/2014  . Obesity, unspecified 07/21/2014  . Esophageal reflux 07/21/2014   Past Surgical History  Procedure Laterality Date  . Laparoscopic abdominal exploration    . Miscarr     History  Substance Use Topics  . Smoking status: Never Smoker   . Smokeless tobacco: Not on file  . Alcohol Use: Not on file   Allergies  Allergen Reactions  . Penicillins Hives  . Sulfa Antibiotics Hives   No family history on file.     Past medical history, social, surgical and family history all reviewed in electronic medical record.   Review of Systems: No headache, visual changes, nausea, vomiting, diarrhea, constipation, dizziness, abdominal pain, skin rash, fevers, chills, night sweats, weight loss, swollen lymph nodes, body aches, joint swelling, muscle aches, chest pain, shortness of breath, mood changes.   Objective Blood pressure 138/86, pulse 66, height '5\' 7"'$  (1.702 m), weight 248 lb (112.492 kg), SpO2 97 %.  General: No apparent distress alert and oriented x3 mood and affect normal, dressed appropriately.  HEENT: Pupils equal, extraocular movements  intact  Respiratory: Patient's speak in full sentences and does not appear short of breath  Cardiovascular: No lower extremity edema, non tender, no erythema  Skin: Warm dry intact with no signs of infection or rash on extremities or on axial skeleton.  Abdomen: Soft nontender  Neuro: Cranial nerves II through XII are intact, neurovascularly intact in all extremities with 2+ DTRs and 2+ pulses.  Lymph: No lymphadenopathy of posterior or anterior cervical chain or axillae bilaterally.  Gait normal with good balance and coordination.  MSK:  Non tender with full range of motion and good stability and symmetric strength and tone of  elbows, wrist, hip, knee and ankles bilaterally.  Shoulder: left Inspection reveals no abnormalities, atrophy or asymmetry. Palpation is normal with no tenderness over AC joint or bicipital groove. Knee range of motion with forward flexion of 120, internal rotation to lateral hip, external rotation of 5. Mild crepitus noted Rotator cuff strength 4 out of 5 compared to 5 out of 5 on the contralateral side signs of impingement with positive Neer and Hawkin's tests, but negative empty can sign. Speeds and Yergason's tests normal. No labral pathology noted with negative Obrien's, negative clunk and good stability. Normal scapular function observed. No painful arc and no drop arm sign. No apprehension sign  MSK US performed of: left This study was ordered, performed, and interpreted by Charlann Boxer D.O.  Shoulder:   Supraspinatus:  Appears normal on long and transverse views, Bursal bulge seen with shoulder abduction on impingement  view.significant thickening of the capsule noted as well as some mild underlying arthritis Infraspinatus:  Appears normal on long and transverse views. Significant increase in Doppler flow Subscapularis:  Appears normal on long and transverse views. Positive bursa Teres Minor:  Appears normal on long and transverse views. AC joint:  Capsule  undistended, no geyser sign. Glenohumeral Joint:  Appears normal without effusion. Glenoid Labrum:  Intact without visualized tears. Biceps Tendon:  Appears normal on long and transverse views, no fraying of tendon, tendon located in intertubercular groove, no subluxation with shoulder internal or external rotation.  Impression: Subacromial bursitis, frozen shoulder, mild arthritis  Procedure: Real-time Ultrasound Guided Injection of left glenohumeral joint Device: GE Logiq E  Ultrasound guided injection is preferred based studies that show increased duration, increased effect, greater accuracy, decreased procedural pain, increased response rate with ultrasound guided versus blind injection.  Verbal informed consent obtained.  Time-out conducted.  Noted no overlying erythema, induration, or other signs of local infection.  Skin prepped in a sterile fashion.  Local anesthesia: Topical Ethyl chloride.  With sterile technique and under real time ultrasound guidance:  Joint visualized.  23g 1  inch needle inserted posterior approach. Pictures taken for needle placement. Patient did have injection of 2 cc of 1% lidocaine, 2 cc of 0.5% Marcaine, and 1.0 cc of Kenalog 40 mg/dL. Completed without difficulty  Pain immediately resolved suggesting accurate placement of the medication.  Advised to call if fevers/chills, erythema, induration, drainage, or persistent bleeding.  Images permanently stored and available for review in the ultrasound unit.  Impression: Technically successful ultrasound guided injection.  Procedure note 05697; 15 minutes spent for Therapeutic exercises as stated in above notes.  This included exercises focusing on stretching, strengthening, with significant focus on eccentric aspects.  Shoulder Exercises that included:  Basic scapular stabilization to include adduction and depression of scapula Scaption, focusing on proper movement and good control Internal and External  rotation utilizing a theraband, with elbow tucked at side entire time Rows with theraband  Proper technique shown and discussed handout in great detail with ATC.  All questions were discussed and answered.     Impression and Recommendations:     This case required medical decision making of moderate complexity.

## 2015-05-10 NOTE — Progress Notes (Signed)
Pre visit review using our clinic review tool, if applicable. No additional management support is needed unless otherwise documented below in the visit note. 

## 2015-05-10 NOTE — Assessment & Plan Note (Signed)
She was injected today and tolerated the procedure very well. We discussed icing regimen and home exercises. We discussed topical anti-inflammatory's. Patient worked with Product/process development scientist today. Patient will do vitamin D supplementation. Patient will come back and see me again in 3-4 weeks for further evaluation.

## 2015-06-06 ENCOUNTER — Ambulatory Visit: Payer: 59 | Admitting: Family Medicine

## 2015-06-07 ENCOUNTER — Ambulatory Visit: Payer: 59 | Admitting: Family Medicine

## 2015-06-16 ENCOUNTER — Encounter: Payer: Self-pay | Admitting: Family Medicine

## 2015-06-16 ENCOUNTER — Ambulatory Visit (INDEPENDENT_AMBULATORY_CARE_PROVIDER_SITE_OTHER): Payer: 59 | Admitting: Family Medicine

## 2015-06-16 VITALS — BP 152/94 | HR 69 | Temp 98.3°F | Ht 67.0 in | Wt 245.9 lb

## 2015-06-16 VITALS — BP 142/88 | HR 65 | Ht 67.0 in | Wt 247.0 lb

## 2015-06-16 DIAGNOSIS — E119 Type 2 diabetes mellitus without complications: Secondary | ICD-10-CM

## 2015-06-16 DIAGNOSIS — F329 Major depressive disorder, single episode, unspecified: Secondary | ICD-10-CM

## 2015-06-16 DIAGNOSIS — M7502 Adhesive capsulitis of left shoulder: Secondary | ICD-10-CM | POA: Diagnosis not present

## 2015-06-16 DIAGNOSIS — E669 Obesity, unspecified: Secondary | ICD-10-CM | POA: Diagnosis not present

## 2015-06-16 DIAGNOSIS — I10 Essential (primary) hypertension: Secondary | ICD-10-CM | POA: Diagnosis not present

## 2015-06-16 DIAGNOSIS — E876 Hypokalemia: Secondary | ICD-10-CM

## 2015-06-16 DIAGNOSIS — F32A Depression, unspecified: Secondary | ICD-10-CM

## 2015-06-16 MED ORDER — GABAPENTIN 100 MG PO CAPS: 200.0000 mg | ORAL_CAPSULE | Freq: Every day | ORAL | Status: DC

## 2015-06-16 MED ORDER — HYDRALAZINE HCL 10 MG PO TABS: 20.0000 mg | ORAL_TABLET | Freq: Three times a day (TID) | ORAL | Status: DC

## 2015-06-16 MED ORDER — MELOXICAM 15 MG PO TABS: 15.0000 mg | ORAL_TABLET | Freq: Every day | ORAL | Status: DC

## 2015-06-16 NOTE — Progress Notes (Signed)
HPI:  Resistant HTN/Hypokalemia: -meds: norvasc 10, coreg 12.5, losartan 100 - reports she takes her medications every day and does not miss doses -has been referred to nephrology but appt still pending -denies: CP, SOB, DOE -has been under a lot of stress, aunt tried to commit suicide, mother is having surgery - doing ok though despite  HLD/Obesity: -advised statin  -she prefered to do aggressive lifestyle changes and then start cholesterol -reports regular exercise and healthy diet -denies hx os statin intol  Hx of diabetes:  -dx with diabetes in 2012  -was on metformin but then taken off as improved with diet and exercise  -Sees Dr. Einar Gip in optho , not yet done -denies: polyuria, wounds, vision changes  HM: -hiv, colon, optho   [name]: See pertinent positives and negatives per HPI.  Past Medical History  Diagnosis Date  . Depression   . Arthritis   . Allergy   . Type 2 diabetes mellitus without complication 0/27/7412  . Resistant hypertension 07/21/2014  . Obesity, unspecified 07/21/2014  . Esophageal reflux 07/21/2014    Past Surgical History  Procedure Laterality Date  . Laparoscopic abdominal exploration    . Miscarr      No family history on file.  Social History   Social History  . Marital Status: Divorced    Spouse Name: N/A  . Number of Children: N/A  . Years of Education: N/A   Social History Main Topics  . Smoking status: Never Smoker   . Smokeless tobacco: None  . Alcohol Use: None  . Drug Use: None  . Sexual Activity: Not Asked   Other Topics Concern  . 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  amLODipine (NORVASC) 10 MG tablet, Take 1 tablet (10 mg total) by mouth daily., Disp: 90 tablet, Rfl: 3 .  aspirin 81  MG tablet, Take 81 mg by mouth daily., Disp: , Rfl:  .  carvedilol (COREG) 12.5 MG tablet, Take 1 tablet (12.5 mg total) by mouth 2 (two) times daily with a meal., Disp: 60 tablet, Rfl: 3 .  cholecalciferol (VITAMIN D) 1000 UNITS tablet, Take 4,000 Units by mouth daily. , Disp: , Rfl:  .  FLUoxetine (PROZAC) 20 MG tablet, Take 2 tablets (40 mg total) by mouth daily., Disp: 180 tablet, Rfl: 3 .  gabapentin (NEURONTIN) 100 MG capsule, Take 2 capsules (200 mg total) by mouth at bedtime., Disp: 60 capsule, Rfl: 3 .  losartan (COZAAR) 100 MG tablet, Take 1 tablet (100 mg total) by mouth daily., Disp: 90 tablet, Rfl: 1 .  meloxicam (MOBIC) 15 MG tablet, Take 1 tablet (15 mg total) by mouth daily., Disp: 30 tablet, Rfl: 0 .  omeprazole (PRILOSEC) 40 MG capsule, Take 1 capsule (40 mg total) by mouth daily., Disp: 90 capsule, Rfl: 3 .  hydrALAZINE (APRESOLINE) 10 MG tablet, Take 2 tablets (20 mg total) by mouth 3 (three) times daily., Disp: 180 tablet, Rfl: 3  EXAM:  Filed Vitals:   06/16/15 0933  BP: 152/94  Pulse: 69  Temp: 98.3 F (36.8 C)    Body mass index is 38.5 kg/(m^2).  GENERAL: vitals reviewed and listed above, alert, oriented, appears  well hydrated and in no acute distress  HEENT: atraumatic, conjunttiva clear, no obvious abnormalities on inspection of external nose and ears  NECK: no obvious masses on inspection  LUNGS: clear to auscultation bilaterally, no wheezes, rales or rhonchi, good air movement  CV: HRRR, no peripheral edema  MS: moves all extremities without noticeable abnormality  PSYCH: pleasant and cooperative, no obvious depression or anxiety  ASSESSMENT AND PLAN:  Discussed the following assessment and plan:  Resistant hypertension - Plan: Aldosterone + renin activity w/ ratio, Basic metabolic panel, hydrALAZINE (APRESOLINE) 10 MG tablet Hypokalemia - Plan: Aldosterone + renin activity w/ ratio, Basic metabolic panel -she has struggle with her BP for many  years and left several prior providers due to frustration with this -initially compliance seemed to be and issues, but she now reports excellent medication adherence -discussed options for management in interim pending nephrology eval - BP better on diuretic, but cause sig hypokalemia, not resolved off diuretic which make hyperaldosteronism a probable possibility - will get morning ald/ren labs for screening, discussed potential for losartan to interfere with this test and she opted to stop until after test - will start hydralazine  Depression -stable despite recent stressors  Type 2 diabetes mellitus without complication -stable -advise eye exam again  Obesity -lifestyle recs  -Patient advised to return or notify a doctor immediately if symptoms worsen or persist or new concerns arise.  Patient Instructions  BEFORE YOU LEAVE: -schedule 8 am fasting lab appointment in about 1-2 weeks -schedule follow up in 3 months  Start hydralazine '10mg'$  three times daily for 1 week, then increase to '20mg'$  3 times daily  Stop the losartan in preparation for the lab work then restart after labs - 1/2 tab for 1 week, then the full dose again  Schedule your diabetic eye exam  -We placed a referral for you as discussed to the nephrologist. It usually takes about 1-2 weeks to process and schedule this referral. If you have not heard from [place] regarding this appointment in 2 weeks please contact our office.  We recommend the following healthy lifestyle measures: - eat a healthy diet consisting of small portions of vegetables, fruits, beans, nuts, seeds, healthy meats such as white chicken and fish and whole grains.  - avoid fried foods, starches, sweets, fast food, processed foods, sodas, red meet and other fattening foods.  - get a least 150 minutes of aerobic exercise per week.        Colin Benton R.

## 2015-06-16 NOTE — Progress Notes (Signed)
  [facility] [address], [place] [address] Phone: [phone] Subjective:    I'm seeing this patient by the request  of:  [name]., DO   CC: bilateral and left arm pain  [id number] Yvette Roberts is a 59 y.o. female coming in with complaint of  Left arm pain and shoulder pain being the worse. Patient was found to have more of a subacromial bursitis as well as frozen shoulder. Patient was given an injection, home exercises, icing protocol. Patient states the shoulder is not much better.  Been doing the exercises intimately. Denies any radiation of pain. States though that it is very uncomfortable at night.  Past Medical History  Diagnosis Date  . Depression   . Arthritis   . Allergy   . Type 2 diabetes mellitus without complication 04/26/5283  . Resistant hypertension 07/21/2014  . Obesity, unspecified 07/21/2014  . Esophageal reflux 07/21/2014   Past Surgical History  Procedure Laterality Date  . Laparoscopic abdominal exploration    . Miscarr     Social History  Substance Use Topics  . Smoking status: Never Smoker   . Smokeless tobacco: None  . Alcohol Use: None   Allergies  Allergen Reactions  . Penicillins Hives  . Sulfa Antibiotics Hives   No family history on file.     Past medical history, social, surgical and family history all reviewed in electronic medical record.   Review of Systems: No headache, visual changes, nausea, vomiting, diarrhea, constipation, dizziness, abdominal pain, skin rash, fevers, chills, night sweats, weight loss, swollen lymph nodes, body aches, joint swelling, muscle aches, chest pain, shortness of breath, mood changes.   Objective Blood pressure 142/88, pulse 65, height '5\' 7"'$  (1.702 m), weight 247 lb (112.038 kg), SpO2 98 %.  General: No apparent distress alert and oriented x3 mood and affect normal, dressed appropriately.  HEENT: Pupils equal, extraocular movements intact  Respiratory:  Patient's speak in full sentences and does not appear short of breath  Cardiovascular: No lower extremity edema, non tender, no erythema  Skin: Warm dry intact with no signs of infection or rash on extremities or on axial skeleton.  Abdomen: Soft nontender  Neuro: Cranial nerves II through XII are intact, neurovascularly intact in all extremities with 2+ DTRs and 2+ pulses.  Lymph: No lymphadenopathy of posterior or anterior cervical chain or axillae bilaterally.  Gait normal with good balance and coordination.  MSK:  Non tender with full range of motion and good stability and symmetric strength and tone of  elbows, wrist, hip, knee and ankles bilaterally.  Shoulder: left Inspection reveals no abnormalities, atrophy or asymmetry. Palpation is normal with no tenderness over AC joint or bicipital groove. range of motion with forward flexion of 10, internal rotation to lateral hip, external rotation of 5. Mild crepitus noted Rotator cuff strength 4 out of 5 compared to 5 out of 5 on the contralateral side signs of impingement with positive Neer and Hawkin's tests, but negative empty can sign. Speeds and Yergason's tests normal. No labral pathology noted with negative Obrien's, negative clunk and good stability. Normal scapular function observed. No painful arc and no drop arm sign. No apprehension sign    Impression and Recommendations:     This case required medical decision making of moderate complexity.

## 2015-06-16 NOTE — Patient Instructions (Addendum)
BEFORE YOU LEAVE: -schedule 8 am fasting lab appointment in about 1-2 weeks -schedule follow up in 3 months  Start hydralazine '10mg'$  three times daily for 1 week, then increase to '20mg'$  3 times daily  Stop the losartan in preparation for the lab work then restart after labs - 1/2 tab for 1 week, then the full dose again  Schedule your diabetic eye exam  -We placed a referral for you as discussed to the nephrologist. It usually takes about 1-2 weeks to process and schedule this referral. If you have not heard from [place] regarding this appointment in 2 weeks please contact our office.  We recommend the following healthy lifestyle measures: - eat a healthy diet consisting of small portions of vegetables, fruits, beans, nuts, seeds, healthy meats such as white chicken and fish and whole grains.  - avoid fried foods, starches, sweets, fast food, processed foods, sodas, red meet and other fattening foods.  - get a least 150 minutes of aerobic exercise per week.

## 2015-06-16 NOTE — Patient Instructions (Signed)
Good to see you.  Ice is still your friend Physical therapy will be calling you meloxicam daily for 10 days then as needed Gabapentin '100mg'$  at night Vitamin D 40900 IU daily for next 2 weeks then back to 2000 IU daily Continue my exercises 3 times a week.  See me again in 3-4 weeks.

## 2015-06-16 NOTE — Progress Notes (Signed)
Pre visit review using our clinic review tool, if applicable. No additional management support is needed unless otherwise documented below in the visit note. 

## 2015-06-16 NOTE — Assessment & Plan Note (Signed)
Patient's declined another injection today. We discussed formal physical therapy and patient was referred today. Anti-inflammatory given gabapentin also given. We discussed icing regimen. Encourage her to do the exercises on a more regular basis. We'll see patient back again in 3-4 weeks for further evaluation and treatment.

## 2015-06-30 ENCOUNTER — Other Ambulatory Visit: Payer: 59

## 2015-07-04 ENCOUNTER — Other Ambulatory Visit (INDEPENDENT_AMBULATORY_CARE_PROVIDER_SITE_OTHER): Payer: 59

## 2015-07-04 DIAGNOSIS — I10 Essential (primary) hypertension: Secondary | ICD-10-CM | POA: Diagnosis not present

## 2015-07-04 DIAGNOSIS — E876 Hypokalemia: Secondary | ICD-10-CM

## 2015-07-04 LAB — BASIC METABOLIC PANEL
BUN: 9 mg/dL (ref 6–23)
CHLORIDE: 103 meq/L (ref 96–112)
CO2: 31 mEq/L (ref 19–32)
CREATININE: 0.7 mg/dL (ref 0.40–1.20)
Calcium: 9.2 mg/dL (ref 8.4–10.5)
GFR: 109.88 mL/min (ref >60.00)
Glucose, Bld: 98 mg/dL (ref 70–99)
POTASSIUM: 3.7 meq/L (ref 3.5–5.1)
SODIUM: 141 meq/L (ref 135–145)

## 2015-07-05 ENCOUNTER — Other Ambulatory Visit: Payer: Self-pay | Admitting: Family Medicine

## 2015-07-08 LAB — ALDOSTERONE + RENIN ACTIVITY W/ RATIO
ALDO / PRA RATIO: 16.7 ratio (ref 0.9–28.9)
Aldosterone: 6 ng/dL
PRA LC/MS/MS: 0.36 ng/mL/h (ref 0.25–5.82)

## 2015-07-13 ENCOUNTER — Telehealth: Payer: Self-pay | Admitting: Family Medicine

## 2015-07-13 ENCOUNTER — Ambulatory Visit: Payer: 59 | Admitting: Family Medicine

## 2015-07-13 NOTE — Telephone Encounter (Signed)
FYI

## 2015-07-13 NOTE — Telephone Encounter (Signed)
[facility] [place]    --------------------------------------------------------------------------------   Patient Name: Yvette Roberts  Gender: Female  DOB: 1956-09-23   Age: 59 Y 52 M 27 D  Return Phone Number: [phone] (Primary)  Address:     City/State/Zip: [place]  [address]   Client [facility] - Client  Client Site [facility] - Day  Physician Colin Benton   Contact Type Call  Call Type Triage / Clinical  Relationship To Patient Self  Appointment Disposition EMR Appointment Attempted - Not Scheduled  Return Phone Number [phone] (Primary)  Chief Complaint Cough  Initial Comment Caller states she is on blood medication and would like to know what she can take for a cold. That will not interact with the medication she is on. Has cough w/ cold Sx.  PreDisposition Call Doctor       Nurse Assessment  Nurse: Amalia Hailey, RN, Lenna Sciara Date/Time Eilene Ghazi Time): 07/13/2015 4:00:45 PM  Confirm and document reason for call. If symptomatic, describe symptoms. ---Caller states she is on blood medication and would like to know what she can take for a cold. That will not interact with the medication she is on. Has cough w/ cold Sx.    Has the patient traveled out of the country within the last 30 days? ---Not Applicable    Does the patient require triage? ---Yes    Related visit to physician within the last 2 weeks? ---No    Does the PT have any chronic conditions? (i.e. diabetes, asthma, etc.) ---Yes    List chronic conditions. ---hypertension, Diabetic-diet controlled,           Guidelines          Guideline Title Affirmed Question Affirmed Notes Nurse Date/Time (Eastern Time)  Cough - Acute Productive Cough with cold symptoms (e.g., runny nose, postnasal drip, throat clearing) (all triage questions negative)    Amalia Hailey, RN, Melissa 07/13/2015 4:03:07 PM     Disp. Time Eilene Ghazi Time) Disposition Final User         07/13/2015 4:11:21 PM Home Care Yes Amalia Hailey, RN, Lenna Sciara            Caller Understands: Yes  Disagree/Comply: Comply       Care Advice Given Per Guideline        HOME CARE: You should be able to treat this at home. REASSURANCE: - OTC COUGH SYRUPS: The most common cough suppressant in OTC cough medications is dextromethorphan. Often the letters 'DM' appear in the name. - OTC COUGH DROPS: Cough drops can help a lot, especially for mild coughs. They reduce coughing by soothing your irritated throat and removing that tickle sensation in the back of the throat. Cough drops also have the advantage of portability - you can carry them with you. COUGH MEDICINES: - HOME REMEDY - HARD CANDY: Hard candy works just as well as medicine-flavored OTC cough drops. Diabetics should use sugar-free candy. - HOME REMEDY - HONEY: This old home remedy has been shown to help decrease coughing at night. The adult dosage is 2 teaspoons (10 ml) at bedtime. Honey should not be given to infants under one year of age. FOR A RUNNY NOSE - BLOW YOUR NOSE: FOR A STUFFY NOSE - USE NASAL WASHES: * How it Helps: The salt water rinses out excess mucus, washes out any irritants (dust, allergens) that might be present, and moistens the nasal cavity. * Introduction:  Saline (salt water) nasal irrigation (nasal wash) is an effective and simple home remedy for treating stuffy nose and sinus congestion. The nose can be irrigated by pouring, spraying, or squirting salt water into the nose and then letting it run back out. * Methods: There are several ways to perform nasal irrigation. You can use a saline nasal spray bottle (available over-the-counter), a rubber ear syringe, a medical syringe without the needle, or a NETI POT. * STEP 1: Lean over a sink. * STEP 2: Gently squirt or spray warm salt water into one of your nostrils. * STEP 3: Some of the water may run into the back of your  throat. Spit this out. If you swallow the salt water it will not hurt you. * STEP 4: Blow your nose to clean out the water and mucus. * STEP 5: Repeat steps 1-4 for the other nostril. You can do this a couple times a day if it seems to help you. HOW TO MAKE SALINE (SALT WATER) NASAL WASH: * You can make your own saline nasal wash. * Add 1/2 tsp of table salt to 1 cup (8 oz; 240 ml) of warm water. * You should use sterile, distilled, or previously boiled water for nasal irrigation. * PSEUDOEPHEDRINE (Sudafed) is available OTC in pill form. Typical adult dosage is two 30 mg tablets every 6 hours. NASAL DECONGESTANTS FOR A VERY STUFFY NOSE: CONTAGIOUSNESS: * The cold virus is present in your nasal secretions. * Cover your nose and mouth with a tissue when you sneeze or cough. Wash your hands frequently. * You can return to work or school after the fever is gone and you feel well enough to participate in normal activities. CALL BACK IF: * You become worse. * Fever lasts over 3 days * Nasal discharge lasts over 10 days * Earache or facial pain develops FEVER MEDICINES: ACETAMINOPHEN (E.G., TYLENOL): IBUPROFEN (E.G., MOTRIN, ADVIL):    After Care Instructions Given        Call Event Type User Date / Time Description        --------------------------------------------------------------------------------

## 2015-07-14 ENCOUNTER — Ambulatory Visit: Payer: 59 | Admitting: Family Medicine

## 2015-07-25 ENCOUNTER — Other Ambulatory Visit: Payer: Self-pay | Admitting: Family Medicine

## 2015-07-30 ENCOUNTER — Other Ambulatory Visit: Payer: Self-pay | Admitting: Family Medicine

## 2015-09-15 ENCOUNTER — Ambulatory Visit: Payer: 59 | Admitting: Family Medicine

## 2015-09-18 ENCOUNTER — Other Ambulatory Visit: Payer: Self-pay | Admitting: Family Medicine

## 2015-09-25 ENCOUNTER — Encounter: Payer: Self-pay | Admitting: Family Medicine

## 2015-09-25 ENCOUNTER — Telehealth: Payer: Self-pay | Admitting: Family Medicine

## 2015-09-25 ENCOUNTER — Ambulatory Visit (INDEPENDENT_AMBULATORY_CARE_PROVIDER_SITE_OTHER): Payer: 59 | Admitting: Family Medicine

## 2015-09-25 ENCOUNTER — Other Ambulatory Visit: Payer: Self-pay | Admitting: Family Medicine

## 2015-09-25 VITALS — BP 142/90 | HR 70 | Temp 97.7°F | Ht 67.0 in | Wt 244.8 lb

## 2015-09-25 DIAGNOSIS — F3341 Major depressive disorder, recurrent, in partial remission: Secondary | ICD-10-CM | POA: Diagnosis not present

## 2015-09-25 DIAGNOSIS — I10 Essential (primary) hypertension: Secondary | ICD-10-CM | POA: Diagnosis not present

## 2015-09-25 DIAGNOSIS — E785 Hyperlipidemia, unspecified: Secondary | ICD-10-CM

## 2015-09-25 DIAGNOSIS — Z23 Encounter for immunization: Secondary | ICD-10-CM | POA: Diagnosis not present

## 2015-09-25 DIAGNOSIS — E119 Type 2 diabetes mellitus without complications: Secondary | ICD-10-CM

## 2015-09-25 DIAGNOSIS — E669 Obesity, unspecified: Secondary | ICD-10-CM

## 2015-09-25 LAB — LIPID PANEL
CHOL/HDL RATIO: 3
CHOLESTEROL: 186 mg/dL (ref 0–200)
HDL: 56.3 mg/dL (ref >39.00)
LDL CALC: 115 mg/dL — AB (ref 0–99)
NonHDL: 129.51
TRIGLYCERIDES: 75 mg/dL (ref 0.0–149.0)
VLDL: 15 mg/dL (ref 0.0–40.0)

## 2015-09-25 LAB — HEMOGLOBIN A1C: Hgb A1c MFr Bld: 6.2 % (ref 4.6–6.5)

## 2015-09-25 NOTE — Telephone Encounter (Signed)
Pt called and cancelled her appointment with Dr Moshe Cipro for the second time today. She was r/s for 11/22/15. If she does not keep that appt she will need another referral. 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

## 2015-09-25 NOTE — Telephone Encounter (Signed)
Can you let her know it is very important that she keep that appt. Thanks.

## 2015-09-25 NOTE — Progress Notes (Signed)
HPI:  Resistant HTN/Hypokalemia: -meds: norvasc 64, coreg 12.5, losartan 100 - reports is not taking hydralazine -has been referred to nephrology- but she did not call them back -denies: CP, SOB, DOE -has been under a lot of stress, aunt tried to commit suicide, mother just out of hospital -diuretic caused severe hypokal - now resolved, renin/ald ok   HLD/Obesity: -advised statin  -she prefered to do aggressive lifestyle changes  -not exercising or eating well lately -denies hx os statin intol  Hx of diabetes:  -dx with diabetes in 2012  -was on metformin but then taken off as improved with diet and exercise  -Sees Dr. Einar Gip in optho -denies: polyuria, wounds, vision changes  MDD-mild: -meds: prozac '40mg'$  daily -in remission - doing ok despite stress with mother being sick   [name]: See pertinent positives and negatives per HPI.  Past Medical History  Diagnosis Date  . Depression   . Arthritis   . Allergy   . Type 2 diabetes mellitus without complication ([place]) 9/32/6712  . Resistant hypertension 07/21/2014  . Obesity, unspecified 07/21/2014  . Esophageal reflux 07/21/2014    Past Surgical History  Procedure Laterality Date  . Laparoscopic abdominal exploration    . Miscarr      No family history on file.  Social History   Social History  . Marital Status: Divorced    Spouse Name: N/A  . Number of Children: N/A  . Years of Education: N/A   Social History Main Topics  . Smoking status: Never Smoker   . Smokeless tobacco: None  . Alcohol Use: None  . Drug Use: None  . Sexual Activity: Not Asked   Other Topics Concern  . 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  amLODipine (NORVASC) 10 MG tablet, take 1 tablet by mouth once  daily, Disp: 30 tablet, Rfl: 2 .  aspirin 81 MG tablet, Take 81 mg by mouth daily., Disp: , Rfl:  .  carvedilol (COREG) 12.5 MG tablet, take 1 tablet by mouth twice a day with meals, Disp: 60 tablet, Rfl: 3 .  cholecalciferol (VITAMIN D) 1000 UNITS tablet, Take 4,000 Units by mouth daily. , Disp: , Rfl:  .  FLUoxetine (PROZAC) 20 MG tablet, Take 2 tablets (40 mg total) by mouth daily., Disp: 180 tablet, Rfl: 3 .  gabapentin (NEURONTIN) 100 MG capsule, Take 2 capsules (200 mg total) by mouth at bedtime., Disp: 60 capsule, Rfl: 3 .  hydrALAZINE (APRESOLINE) 10 MG tablet, Take 2 tablets (20 mg total) by mouth 3 (three) times daily., Disp: 180 tablet, Rfl: 3 .  losartan (COZAAR) 100 MG tablet, take 1 tablet by mouth once daily, Disp: 30 tablet, Rfl: 2 .  omeprazole (PRILOSEC) 40 MG capsule, take 1 capsule by mouth once daily, Disp: 90 capsule, Rfl: 0  EXAM:  Filed Vitals:   09/25/15 0819 09/25/15 0821  BP: 142/90 142/90  Pulse: 70   Temp: 97.7 F (36.5 C)     Body mass index is 38.33 kg/(m^2).  GENERAL: vitals reviewed and listed above, alert, oriented, appears well hydrated and in no acute distress  HEENT: atraumatic, conjunttiva clear, no obvious abnormalities on inspection of external  nose and ears  NECK: no obvious masses on inspection  LUNGS: clear to auscultation bilaterally, no wheezes, rales or rhonchi, good air movement  CV: HRRR, no peripheral edema  MS: moves all extremities without noticeable abnormality  PSYCH: pleasant and cooperative, no obvious depression or anxiety  ASSESSMENT AND PLAN:  Discussed the following assessment and plan:  Type 2 diabetes mellitus without complication, without long-term current use of insulin (HCC) - Plan: Hemoglobin A1c -foot exam done -healthy lifestyle advised  Resistant hypertension -add back hydralazine until sees nephrologist - she agrees to schedule -discussed sig risks of persistently elevated BP - she is reluctant to treat  further but agreed after discussion  Obesity -lifestyle recs  Recurrent major depressive disorder, in partial remission ([place]) -continue current tx  Hyperlipemia - Plan: Lipid Panel -add statin if higher  -Patient advised to return or notify a doctor immediately if symptoms worsen or persist or new concerns arise.  Patient Instructions  BEFORE YOU LEAVE: -labs -flu shot -schedule follow up in 4-6 months  Call the kidney specialist today to reschedule your visit for help with your blood pressure Make sure you are taking all of the blood pressure medications as follows: -ADD hydralazine 10 mg three time daily -norvasc 10 mg daily in the morning -losartan '100mg'$  daily in the morning -coreg 12.5 mg twice daily  Call to schedule your diabetic eye exam  Call to schedule your gynecology exam  -We have ordered labs or studies at this visit. It can take up to 1-2 weeks for results and processing. We will contact you with instructions IF your results are abnormal. Normal results will be released to your [facility]. If you have not heard from [place] or can not find your results in [facility] in 2 weeks please contact our office.  We recommend the following healthy lifestyle measures: - eat a healthy whole foods diet consisting of regular small meals composed of vegetables, fruits, beans, nuts, seeds, healthy meats such as white chicken and fish and whole grains.  - avoid sweets, white starchy foods, fried foods, fast food, processed foods, sodas, red meet and other fattening foods.  - get a least 150-300 minutes of aerobic exercise per week.            Colin Benton R.

## 2015-09-25 NOTE — Progress Notes (Signed)
Pre visit review using our clinic review tool, if applicable. No additional management support is needed unless otherwise documented below in the visit note. 

## 2015-09-25 NOTE — Patient Instructions (Signed)
BEFORE YOU LEAVE: -labs -flu shot -schedule follow up in 4-6 months  Call the kidney specialist today to reschedule your visit for help with your blood pressure Make sure you are taking all of the blood pressure medications as follows: -ADD hydralazine 10 mg three time daily -norvasc 10 mg daily in the morning -losartan '100mg'$  daily in the morning -coreg 12.5 mg twice daily  Call to schedule your diabetic eye exam  Call to schedule your gynecology exam  -We have ordered labs or studies at this visit. It can take up to 1-2 weeks for results and processing. We will contact you with instructions IF your results are abnormal. Normal results will be released to your [facility]. If you have not heard from [place] or can not find your results in [facility] in 2 weeks please contact our office.  We recommend the following healthy lifestyle measures: - eat a healthy whole foods diet consisting of regular small meals composed of vegetables, fruits, beans, nuts, seeds, healthy meats such as white chicken and fish and whole grains.  - avoid sweets, white starchy foods, fried foods, fast food, processed foods, sodas, red meet and other fattening foods.  - get a least 150-300 minutes of aerobic exercise per week.

## 2015-09-26 MED ORDER — PRAVASTATIN SODIUM 20 MG PO TABS: 20.0000 mg | ORAL_TABLET | Freq: Every day | ORAL | Status: DC

## 2015-09-26 NOTE — Addendum Note (Signed)
Addended by: Agnes Lawrence on: 09/26/2015 08:47 AM   Modules accepted: Orders

## 2015-09-26 NOTE — Telephone Encounter (Signed)
I left a detailed message with the information below at the pts cell number. 

## 2015-10-12 ENCOUNTER — Other Ambulatory Visit: Payer: Self-pay | Admitting: Family Medicine

## 2015-10-12 NOTE — Telephone Encounter (Signed)
Refill done.  

## 2015-10-31 ENCOUNTER — Other Ambulatory Visit: Payer: Self-pay | Admitting: Family Medicine

## 2015-12-19 ENCOUNTER — Other Ambulatory Visit: Payer: Self-pay | Admitting: Family Medicine

## 2016-01-23 ENCOUNTER — Ambulatory Visit: Payer: Self-pay | Admitting: Family Medicine

## 2016-02-07 ENCOUNTER — Ambulatory Visit (INDEPENDENT_AMBULATORY_CARE_PROVIDER_SITE_OTHER): Payer: BLUE CROSS/BLUE SHIELD | Admitting: Family Medicine

## 2016-02-07 ENCOUNTER — Encounter: Payer: Self-pay | Admitting: Family Medicine

## 2016-02-07 VITALS — BP 140/80 | HR 53 | Temp 98.2°F | Ht 67.0 in | Wt 236.0 lb

## 2016-02-07 DIAGNOSIS — I10 Essential (primary) hypertension: Secondary | ICD-10-CM | POA: Diagnosis not present

## 2016-02-07 DIAGNOSIS — E119 Type 2 diabetes mellitus without complications: Secondary | ICD-10-CM

## 2016-02-07 DIAGNOSIS — F3342 Major depressive disorder, recurrent, in full remission: Secondary | ICD-10-CM

## 2016-02-07 DIAGNOSIS — E669 Obesity, unspecified: Secondary | ICD-10-CM

## 2016-02-07 DIAGNOSIS — L509 Urticaria, unspecified: Secondary | ICD-10-CM

## 2016-02-07 DIAGNOSIS — E785 Hyperlipidemia, unspecified: Secondary | ICD-10-CM | POA: Diagnosis not present

## 2016-02-07 LAB — LIPID PANEL
CHOLESTEROL: 173 mg/dL (ref 0–200)
HDL: 61.9 mg/dL (ref >39.00)
LDL Cholesterol: 97 mg/dL (ref 0–99)
NonHDL: 110.75
TRIGLYCERIDES: 67 mg/dL (ref 0.0–149.0)
Total CHOL/HDL Ratio: 3
VLDL: 13.4 mg/dL (ref 0.0–40.0)

## 2016-02-07 MED ORDER — AMLODIPINE BESYLATE 10 MG PO TABS: 10.0000 mg | ORAL_TABLET | Freq: Every day | ORAL | Status: DC

## 2016-02-07 MED ORDER — CARVEDILOL 12.5 MG PO TABS: 12.5000 mg | ORAL_TABLET | Freq: Two times a day (BID) | ORAL | Status: DC

## 2016-02-07 MED ORDER — PRAVASTATIN SODIUM 20 MG PO TABS: 20.0000 mg | ORAL_TABLET | Freq: Every day | ORAL | Status: DC

## 2016-02-07 MED ORDER — LOSARTAN POTASSIUM 100 MG PO TABS: 100.0000 mg | ORAL_TABLET | Freq: Every day | ORAL | Status: DC

## 2016-02-07 MED ORDER — FLUOXETINE HCL 20 MG PO TABS: 40.0000 mg | ORAL_TABLET | Freq: Every day | ORAL | Status: DC

## 2016-02-07 MED ORDER — OMEPRAZOLE 40 MG PO CPDR: 40.0000 mg | DELAYED_RELEASE_CAPSULE | Freq: Every day | ORAL | Status: DC

## 2016-02-07 NOTE — Progress Notes (Signed)
HPI:  Resistant HTN/Hypokalemia: -meds: norvasc 10, coreg 12.5, losartan 100, aldactone 12.'5mg'$  bid -referred to nephrology; she started a very low-dose of aldosterone in the last several days and has follow-up with her kidney specialist in several weeks -denies: CP, SOB, DOE -diuretic caused severe hypokal - now resolved, renin/ald ok  HLD/Obesity: -advised statin again after last chk -lifestyle: Walking more than in the past, eating less -denies hx os statin intol  Hx of diabetes:  -dx with diabetes in 2012  -was on metformin but then taken off as improved with diet and exercise  -Sees Dr. Einar Gip in optho -denies: polyuria, wounds, vision changes  MDD-mild: -meds: prozac '40mg'$  daily -in remission - doing ok despite stress with mother being sick - has had increased stress with mother's illness recently  Occasional hives: -Thinks is due to certain foods -No swelling of the throat face or tongue, no trouble breathing -wants to see allergist -Feels cannot eat bananas, milk and other foods  [name]: See pertinent positives and negatives per HPI.  Past Medical History  Diagnosis Date  . Depression   . Arthritis   . Allergy   . Type 2 diabetes mellitus without complication ([place]) 0/35/5974  . Resistant hypertension 07/21/2014  . Obesity, unspecified 07/21/2014  . Esophageal reflux 07/21/2014    Past Surgical History  Procedure Laterality Date  . Laparoscopic abdominal exploration    . Miscarr      No family history on file.  Social History   Social History  . Marital Status: Divorced    Spouse Name: N/A  . Number of Children: N/A  . Years of Education: N/A   Social History Main Topics  . Smoking status: Never Smoker   . Smokeless tobacco: None  . Alcohol Use: None  . Drug Use: None  . Sexual Activity: Not Asked   Other Topics Concern  . 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  amLODipine (NORVASC) 10 MG tablet, take 1 tablet by mouth once daily, Disp: 30 tablet, Rfl: 2 .  aspirin 81 MG tablet, Take 81 mg by mouth daily., Disp: , Rfl:  .  carvedilol (COREG) 12.5 MG tablet, take 1 tablet by mouth twice a day with meals, Disp: 60 tablet, Rfl: 3 .  cholecalciferol (VITAMIN D) 1000 UNITS tablet, Take 4,000 Units by mouth daily. , Disp: , Rfl:  .  FLUoxetine (PROZAC) 20 MG tablet, Take 2 tablets (40 mg total) by mouth daily., Disp: 180 tablet, Rfl: 3 .  gabapentin (NEURONTIN) 100 MG capsule, take 2 capsules by mouth at bedtime, Disp: 60 capsule, Rfl: 5 .  losartan (COZAAR) 100 MG tablet, take 1 tablet by mouth once daily, Disp: 30 tablet, Rfl: 2 .  omeprazole (PRILOSEC) 40 MG capsule, take 1 capsule by mouth once daily, Disp: 90 capsule, Rfl: 0 .  pravastatin (PRAVACHOL) 20 MG tablet, Take 1 tablet (20 mg total) by mouth daily., Disp: 90 tablet, Rfl: 3 .  spironolactone (ALDACTONE) 25 MG tablet, Take 12.5 mg by mouth 2 (two) times daily., Disp: , Rfl: 0  EXAM:  Filed Vitals:   02/07/16 0939  BP: 168/92  Pulse: 53  Temp: 98.2 F (36.8  C)    Body mass index is 36.95 kg/(m^2).  GENERAL: vitals reviewed and listed above, alert, oriented, appears well hydrated and in no acute distress  HEENT: atraumatic, conjunttiva clear, no obvious abnormalities on inspection of external nose and ears  NECK: no obvious masses on inspection  LUNGS: clear to auscultation bilaterally, no wheezes, rales or rhonchi, good air movement  CV: HRRR, no peripheral edema  MS: moves all extremities without noticeable abnormality  PSYCH: pleasant and cooperative, no obvious depression or anxiety  ASSESSMENT AND PLAN:  Discussed the following assessment and plan:  Hyperlipemia - Plan: Lipid Panel  Type 2 diabetes mellitus without  complication, without long-term current use of insulin (HCC)  Resistant hypertension  Recurrent major depressive disorder, in full remission ([place])  Obesity  -labs -BP follow up with nephrologist -lifestyle recommendations -Advised to schedule mammogram -She continues to monitor blood pressure and has started a new medication recently -Physical in 4 months -will need foot examination -Information for allergist provided -she agrees to call and schedule an appointment for urticaria -Patient advised to return or notify a doctor immediately if symptoms worsen or persist or new concerns arise.  Patient Instructions  Before you leave: -Recheck blood pressure -Schedule your physical exam in about 4 months -Labs  Please schedule your mammogram  Continue to monitor your blood pressure and follow up with your a specialist as planned. Continue the new medication.  We recommend the following healthy lifestyle measures: - eat a healthy whole foods diet consisting of regular small meals composed of vegetables, fruits, beans, nuts, seeds, healthy meats such as white chicken and fish and whole grains.  - avoid sweets, white starchy foods, fried foods, fast food, processed foods, sodas, red meet and other fattening foods.  - get a least 150-300 minutes of aerobic exercise per week.       Colin Benton R.

## 2016-02-07 NOTE — Addendum Note (Signed)
Addended by: Agnes Lawrence on: 02/07/2016 10:24 AM   Modules accepted: Orders

## 2016-02-07 NOTE — Progress Notes (Signed)
Pre visit review using our clinic review tool, if applicable. No additional management support is needed unless otherwise documented below in the visit note. 

## 2016-02-07 NOTE — Patient Instructions (Addendum)
Before you leave: -Recheck blood pressure -Schedule your physical exam in about 4 months -Labs  Please schedule your mammogram  Continue to monitor your blood pressure and follow up with your a specialist as planned. Continue the new medication.  We recommend the following healthy lifestyle measures: - eat a healthy whole foods diet consisting of regular small meals composed of vegetables, fruits, beans, nuts, seeds, healthy meats such as white chicken and fish and whole grains.  - avoid sweets, white starchy foods, fried foods, fast food, processed foods, sodas, red meet and other fattening foods.  - get a least 150-300 minutes of aerobic exercise per week.

## 2016-02-12 ENCOUNTER — Other Ambulatory Visit: Payer: Self-pay | Admitting: Family Medicine

## 2016-02-12 DIAGNOSIS — Z1231 Encounter for screening mammogram for malignant neoplasm of breast: Secondary | ICD-10-CM

## 2016-02-28 ENCOUNTER — Ambulatory Visit
Admission: RE | Admit: 2016-02-28 | Discharge: 2016-02-28 | Disposition: A | Discharge status: Home / Self Care | Payer: BLUE CROSS/BLUE SHIELD | Source: Ambulatory Visit | Attending: Family Medicine | Admitting: Family Medicine

## 2016-02-28 DIAGNOSIS — Z1231 Encounter for screening mammogram for malignant neoplasm of breast: Secondary | ICD-10-CM

## 2016-03-13 ENCOUNTER — Telehealth: Payer: Self-pay | Admitting: Family Medicine

## 2016-03-13 NOTE — Telephone Encounter (Signed)
Pt has not be resting for the last two months and would like to have something to help her rest.

## 2016-03-13 NOTE — Telephone Encounter (Signed)
Last OV was 02/07/2016 Spoke with pt and she has not tried any OTC or prescription medications in the past.  Pt is aware that you are out of the office until 03/14/16

## 2016-03-14 NOTE — Telephone Encounter (Signed)
Please help her to schedule appt. (30 minute) there are many causes of poor sleep and depending on cause they are addressed differently. Thanks.

## 2016-03-14 NOTE — Telephone Encounter (Signed)
I called the pt and informed her of the message below.  Appt scheduled for 5/23.

## 2016-03-19 ENCOUNTER — Ambulatory Visit: Payer: BLUE CROSS/BLUE SHIELD | Admitting: Family Medicine

## 2016-03-29 ENCOUNTER — Ambulatory Visit: Payer: BLUE CROSS/BLUE SHIELD | Admitting: Family Medicine

## 2016-03-29 DIAGNOSIS — Z0289 Encounter for other administrative examinations: Secondary | ICD-10-CM

## 2016-04-12 ENCOUNTER — Other Ambulatory Visit: Payer: Self-pay | Admitting: *Deleted

## 2016-04-12 MED ORDER — GABAPENTIN 100 MG PO CAPS: 200.0000 mg | ORAL_CAPSULE | Freq: Every day | ORAL | Status: DC

## 2016-04-12 NOTE — Telephone Encounter (Signed)
Refill done.  

## 2016-06-06 ENCOUNTER — Ambulatory Visit: Payer: BLUE CROSS/BLUE SHIELD | Admitting: Family Medicine

## 2016-06-12 DIAGNOSIS — E785 Hyperlipidemia, unspecified: Secondary | ICD-10-CM | POA: Insufficient documentation

## 2016-06-12 DIAGNOSIS — L509 Urticaria, unspecified: Secondary | ICD-10-CM | POA: Insufficient documentation

## 2016-06-12 DIAGNOSIS — F329 Major depressive disorder, single episode, unspecified: Secondary | ICD-10-CM | POA: Insufficient documentation

## 2016-06-12 NOTE — Progress Notes (Signed)
HPI:  Resistant HTN/Hypokalemia: -meds: norvasc 10, coreg 12.5, losartan 100, aldactone 25 -denies: CP, SOB, DOE -diuretic caused severe hypokal - now resolved, renin/ald ok -reports monitors BP at home with readings 1teens over 70s. Sees nephrologist on regular basis and reports had labs recently there.  HLD/Obesity: -meds: pravastatin -lifestyle: Walking more than in the past, eating less -denies hx os statin intol  Hx of diabetes:  -dx with diabetes in 2012  -was on metformin but then taken off as improved with diet and exercise  -Sees Dr. 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  Past Medical History:  Diagnosis Date  . Allergy   . Arthritis   . Depression   . Esophageal reflux 07/21/2014  . Obesity, unspecified 07/21/2014  . Resistant hypertension 07/21/2014  . Type 2 diabetes mellitus without complication ([place]) 5/95/6387    Past Surgical History:  Procedure Laterality Date  . LAPAROSCOPIC ABDOMINAL EXPLORATION    . miscarr      No family history on file.  Social History   Social History  . Marital status: Divorced    Spouse name: N/A  . Number of children: N/A  . Years of education: N/A   Social History Main Topics  . Smoking status: Never Smoker  . Smokeless tobacco: None  . Alcohol use None  . Drug use: Unknown  . Sexual activity: Not Asked   Other Topics Concern  . 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  amLODipine (NORVASC) 10 MG tablet, Take 1 tablet (10 mg total) by mouth daily., Disp: 30 tablet, Rfl: 3 .  aspirin 81 MG tablet, Take 81 mg by mouth daily., Disp: , Rfl:  .  carvedilol (COREG) 12.5 MG tablet, Take 1 tablet (12.5 mg total) by mouth 2 (two) times daily with a meal., Disp: 60 tablet, Rfl: 3 .  cholecalciferol (VITAMIN D) 1000 UNITS tablet, Take 4,000 Units by mouth daily. , Disp: , Rfl:  .  FLUoxetine (PROZAC) 20 MG tablet, Take 2 tablets (40 mg total) by mouth daily., Disp: 180 tablet, Rfl: 1 .  losartan (COZAAR) 100 MG tablet, Take 1 tablet (100 mg total) by mouth daily., Disp: 30 tablet, Rfl: 3 .  omeprazole (PRILOSEC) 40 MG capsule, Take 1 capsule (40 mg total) by mouth daily., Disp: 90 capsule, Rfl: 1 .  pravastatin (PRAVACHOL) 20 MG tablet, Take 1 tablet (20 mg total) by mouth daily., Disp: 90 tablet, Rfl: 1 .  spironolactone (ALDACTONE) 25 MG tablet, Take 12.5 mg by mouth 2 (two) times daily., Disp: , Rfl: 0  EXAM:  Vitals:   06/13/16 0904  BP: 130/82  Pulse: 67  Temp: 98.1 F (36.7 C)    Body mass index is  37.04 kg/m.  GENERAL: vitals reviewed and listed above, alert, oriented, appears well hydrated and in no acute distress  HEENT: atraumatic, conjunttiva clear, no obvious abnormalities on inspection of external nose and ears  NECK: no obvious masses on inspection  LUNGS: clear to auscultation bilaterally, no wheezes, rales or rhonchi, good air movement  CV: HRRR, no peripheral edema  MS: moves all extremities without noticeable abnormality  PSYCH: pleasant and cooperative, no obvious depression or anxiety  ASSESSMENT AND PLAN:  Discussed the following assessment and plan:  Insomnia -discussed tx options -plan to start with sleep hygiene, cbt and OTC options - discussed risks  prescription sleep aides and she logically prefers to avoid if possible  Hyperlipemia BMI 37.0-37.9, adult -lifestyle recs  Resistant hypertension -much improved, sees nephrologist for this  Type 2 diabetes mellitus without complication, without long-term current use of insulin ([place]) - Plan: POC HgB A1c -hgba1c today, lifestyle recs  CPE for preventive care in 3 months  NOTE: pt upset as reports called to schedule appt with me about sleep and someone made her discuss with a nurse. She felt that the nurse was very rude and she was upset took "2 hours" on the phone just to get an appt. So she call back and canceled the appt. She then got a no show fee. She was very upset and considered changing practices but opted to talk with me first. I apologized for her inconvenience and advised I will notify my office manager to help [place] improve and to see if no show fee can be removed. She was appreciative.  -Patient advised to return or notify a doctor immediately if symptoms worsen or persist or new concerns arise.  Patient Instructions  BEFORE YOU LEAVE: -follow up: physical in 3-4 months, come fasting and will plan to do labs then -POC hgba1c  We recommend the following healthy lifestyle: 1) Small portions - eat off of salad plate instead of dinner plate 2) Eat a healthy clean diet with avoidance of (less then 1 serving per week) processed foods, sweetened drinks, white starches, red meat, fast foods and sweets and consisting of: * 5-9 servings per day of fresh or frozen fruits and vegetables (not corn or potatoes, not dried or canned) *nuts and seeds, beans *olives and olive oil *small portions of lean meats such as fish and white chicken  *small portions of whole grains 3)Get at least 150 minutes of sweaty aerobic exercise per week 4)reduce stress - counseling, meditation, relaxation to balance other aspects of your life  FOR IMPROVED SLEEP AND TO RESET YOUR SLEEP SCHEDULE: '[]'$  exercise 30  minutes daily  '[]'$  go to bed and wake up at the same time  '[]'$  keep bedroom cool, dark and quiet  '[]'$  reserve bed for sleep - do not read, watch TV, etc in bed  '[]'$  If you toss and turn more then 15-20 minutes get out of bed and list thoughts/do quite activity then go back to bed; repeat as needed; do not worry about when you eventually fall asleep - still get up at the same time and turn on lights and take shower  '[]'$ get counseling  '[]'$  some people find that a half dose of benadryl, melatonin, tylenol pm or unisom on a few nights per week is helpful initially for a few weeks  '[]'$ seek help and treat any depression or anxiety  '[]'$ prescription strength sleep medications should only be used in severe cases of insomnia if other measures fail and should be used  sparingly    Colin Benton R., DO

## 2016-06-13 ENCOUNTER — Encounter: Payer: Self-pay | Admitting: Family Medicine

## 2016-06-13 ENCOUNTER — Ambulatory Visit (INDEPENDENT_AMBULATORY_CARE_PROVIDER_SITE_OTHER): Payer: BLUE CROSS/BLUE SHIELD | Admitting: Family Medicine

## 2016-06-13 VITALS — BP 130/82 | HR 67 | Temp 98.1°F | Ht 67.0 in | Wt 236.5 lb

## 2016-06-13 DIAGNOSIS — E785 Hyperlipidemia, unspecified: Secondary | ICD-10-CM

## 2016-06-13 DIAGNOSIS — I10 Essential (primary) hypertension: Secondary | ICD-10-CM

## 2016-06-13 DIAGNOSIS — G4700 Insomnia, unspecified: Secondary | ICD-10-CM

## 2016-06-13 DIAGNOSIS — Z6837 Body mass index (BMI) 37.0-37.9, adult: Secondary | ICD-10-CM

## 2016-06-13 DIAGNOSIS — L509 Urticaria, unspecified: Secondary | ICD-10-CM

## 2016-06-13 DIAGNOSIS — E119 Type 2 diabetes mellitus without complications: Secondary | ICD-10-CM

## 2016-06-13 DIAGNOSIS — I1A0 Resistant hypertension: Secondary | ICD-10-CM

## 2016-06-13 LAB — POCT GLYCOSYLATED HEMOGLOBIN (HGB A1C): HEMOGLOBIN A1C: 6

## 2016-06-13 NOTE — Patient Instructions (Signed)
BEFORE YOU LEAVE: -follow up: physical in 3-4 months, come fasting and will plan to do labs then -POC hgba1c  We recommend the following healthy lifestyle: 1) Small portions - eat off of salad plate instead of dinner plate 2) Eat a healthy clean diet with avoidance of (less then 1 serving per week) processed foods, sweetened drinks, white starches, red meat, fast foods and sweets and consisting of: * 5-9 servings per day of fresh or frozen fruits and vegetables (not corn or potatoes, not dried or canned) *nuts and seeds, beans *olives and olive oil *small portions of lean meats such as fish and white chicken  *small portions of whole grains 3)Get at least 150 minutes of sweaty aerobic exercise per week 4)reduce stress - counseling, meditation, relaxation to balance other aspects of your life  FOR IMPROVED SLEEP AND TO RESET YOUR SLEEP SCHEDULE: '[]'$  exercise 30 minutes daily  '[]'$  go to bed and wake up at the same time  '[]'$  keep bedroom cool, dark and quiet  '[]'$  reserve bed for sleep - do not read, watch TV, etc in bed  '[]'$  If you toss and turn more then 15-20 minutes get out of bed and list thoughts/do quite activity then go back to bed; repeat as needed; do not worry about when you eventually fall asleep - still get up at the same time and turn on lights and take shower  '[]'$ get counseling  '[]'$  some people find that a half dose of benadryl, melatonin, tylenol pm or unisom on a few nights per week is helpful initially for a few weeks  '[]'$ seek help and treat any depression or anxiety  '[]'$ prescription strength sleep medications should only be used in severe cases of insomnia if other measures fail and should be used sparingly

## 2016-06-13 NOTE — Progress Notes (Signed)
Pre visit review using our clinic review tool, if applicable. No additional management support is needed unless otherwise documented below in the visit note. 

## 2016-06-16 ENCOUNTER — Other Ambulatory Visit: Payer: Self-pay | Admitting: Family Medicine

## 2016-08-02 ENCOUNTER — Other Ambulatory Visit: Payer: Self-pay | Admitting: Family Medicine

## 2016-08-31 ENCOUNTER — Other Ambulatory Visit: Payer: Self-pay | Admitting: Family Medicine

## 2016-10-10 ENCOUNTER — Ambulatory Visit: Payer: BLUE CROSS/BLUE SHIELD | Admitting: Family Medicine

## 2016-10-14 NOTE — Progress Notes (Signed)
HPI:  Yvette Roberts is a pleasant 60 year old with a past medical history significant for resistant hypertension, hypokalemia, hyperlipidemia, obesity, diet-controlled diabetes, urticaria, major depressive disorder and generalized anxiety here for a follow-up visit. Reports she is doing much better. Her stress level has been much lower in her depression and anxiety have improved with her mother receiving treatment for her mood. She does not want a flu shot today as she reports she developed hives all over after her last flu shot. She also reports she has noticed she gets hives to peanuts and eggs. She has not yet seen the allergist. Otherwise no complaints today. Denies swelling, shortness of breath wounds on feet, exposures to new substances or medications, depression or panic. Due for physical, colon cancer screening, eye exam, foot exam, flu shot, shingles vaccine and labs. Declines vaccines, see above. Agrees to Cologuard for colon cancer screening after discussion of options. She agrees to set up her eye exam.  Declined HIV and hepatitis C screening in the past. See summary of her health issues listed below   Resistant HTN/Hypokalemia: -meds: norvasc 10, coreg 12.5, losartan 100, aldactone 25 -diuretic caused severe hypokal - now resolved, renin/ald ok -monitors BP at home, Sees nephrologist on regular basis   HLD/Obesity: -meds: pravastatin  Hx of diabetes:  -dx with diabetes in 2012  -was on metformin but then taken off as improved with diet and exercise  -Sees Dr. Einar Gip in optho  MDD-mild/GAD: -meds: prozac '40mg'$  daily -mother sick with dementia   Occasional hives: -Thinks is due to certain foods -No swelling of the throat face or tongue, no trouble breathing -Feels cannot eat bananas, milk, eggs and peanuts  and other foods -Reports hives after getting a flu shot -advised allergist evaluation in the past  [name]: See pertinent positives and negatives per  HPI.  Past Medical History:  Diagnosis Date  . Allergy   . Arthritis   . Depression   . Esophageal reflux 07/21/2014  . Obesity, unspecified 07/21/2014  . Resistant hypertension 07/21/2014  . Type 2 diabetes mellitus without complication ([place]) 1/61/0960    Past Surgical History:  Procedure Laterality Date  . LAPAROSCOPIC ABDOMINAL EXPLORATION    . miscarr      No family history on file.  Social History   Social History  . Marital status: Divorced    Spouse name: N/A  . Number of children: N/A  . Years of education: N/A   Social History Main Topics  . Smoking status: Never Smoker  . Smokeless tobacco: None  . Alcohol use None  . Drug use: Unknown  . Sexual activity: Not Asked   Other Topics Concern  . 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  amLODipine (NORVASC) 10 MG tablet, take 1 tablet by mouth once daily, Disp: 30 tablet, Rfl: 11 .  aspirin 81 MG tablet, Take 81 mg by mouth daily., Disp: , Rfl:  .  carvedilol (COREG) 12.5 MG tablet, take 1 tablet by mouth twice a day with meals, Disp: 60 tablet, Rfl: 11 .  cholecalciferol (VITAMIN D) 1000 UNITS tablet, Take 4,000 Units by mouth daily. , Disp: , Rfl:  .  FLUoxetine (  PROZAC) 20 MG tablet, take 2 tablets by mouth once daily, Disp: 180 tablet, Rfl: 1 .  losartan (COZAAR) 100 MG tablet, take 1 tablet by mouth once daily, Disp: 30 tablet, Rfl: 11 .  omeprazole (PRILOSEC) 40 MG capsule, take 1 capsule by mouth once daily, Disp: 90 capsule, Rfl: 1 .  pravastatin (PRAVACHOL) 20 MG tablet, take 1 tablet by mouth once daily, Disp: 90 tablet, Rfl: 1 .  spironolactone (ALDACTONE) 25 MG tablet, Take 12.5 mg by mouth 2 (two) times daily., Disp: , Rfl: 0  EXAM:  Vitals:   10/15/16 0945  BP: 116/70   Pulse: (!) 54  Temp: 98.3 F (36.8 C)    Body mass index is 36.79 kg/m.  GENERAL: vitals reviewed and listed above, alert, oriented, appears well hydrated and in no acute distress  HEENT: atraumatic, conjunttiva clear, no obvious abnormalities on inspection of external nose and ears  NECK: no obvious masses on inspection  LUNGS: clear to auscultation bilaterally, no wheezes, rales or rhonchi, good air movement  CV: HRRR, no peripheral edema  MS: moves all extremities without noticeable abnormality  PSYCH: pleasant and cooperative, no obvious depression or anxiety  ASSESSMENT AND PLAN:  Discussed the following assessment and plan:  Resistant hypertension - Plan: Basic metabolic panel, CBC (no diff)  Type 2 diabetes mellitus without complication, without long-term current use of insulin (HCC)  Class 2 obesity with serious comorbidity and body mass index (BMI) of 36.0 to 36.9 in adult, unspecified obesity type  Hyperlipidemia, unspecified hyperlipidemia type  Major depressive disorder with single episode, in full remission ([place])  Urticaria  -We will have assistant update her allergy list -Advised allergy evaluation and testing -Labs today -Foot exam done today -Advised colon cancer screening and options and she opted to do the Cologuard test, have advised assistant to order this for her -Advised that she schedule a physical -Eye exam advised -Patient advised to return or notify a doctor immediately if symptoms worsen or persist or new concerns arise.  Patient Instructions  BEFORE YOU LEAVE: -order cologuard test -update allergies: pt reports hives with eggs, flu shot, peanuts -follow up: 1)Physical exam in 3 months, come fasting if convenient and morning appointment  We ordered the Cologuard test for colon cancer screening. Please complete this test promptly once the kit arrives. Please contact us if you have not received your kit in the next few weeks.  Please  call your eye doctor today to schedule a diabetic eye exam  Please see the allergist for testing  We have ordered labs or studies at this visit. It can take up to 1-2 weeks for results and processing. IF results require follow up or explanation, we will call you with instructions. Clinically stable results will be released to your [facility]. If you have not heard from [place] or cannot find your results in [facility] in 2 weeks please contact our office at [phone].  If you are not yet signed up for [facility], please consider signing up.   We recommend the following healthy lifestyle for LIFE: 1) Small portions.   Tip: eat off of a salad plate instead of a dinner plate.  Tip: It is ok to feel hungry after a meal of proper portion sizes  Tip: if you need more or a snack choose fruits, veggies and/or a handful of nuts or seeds.  2) Eat a healthy clean diet.  * Tip: Avoid (less then 1 serving per week): processed foods, sweets, sweetened drinks, white starches (rice,  flour, bread, potatoes, pasta, etc), red meat, fast foods, butter  *Tip: CHOOSE instead   * 5-9 servings per day of fresh or frozen fruits and vegetables (but not corn, potatoes, bananas, canned or dried fruit)   *nuts and seeds, beans   *olives and olive oil   *small portions of lean meats such as fish and white chicken    *small portions of whole grains  3)Get at least 150 minutes of sweaty aerobic exercise per week.  4)Reduce stress - consider counseling, meditation and relaxation to balance other aspects of your life.         Colin Benton R., DO

## 2016-10-15 ENCOUNTER — Encounter: Payer: Self-pay | Admitting: Family Medicine

## 2016-10-15 ENCOUNTER — Ambulatory Visit (INDEPENDENT_AMBULATORY_CARE_PROVIDER_SITE_OTHER): Payer: BLUE CROSS/BLUE SHIELD | Admitting: Family Medicine

## 2016-10-15 VITALS — BP 116/70 | HR 54 | Temp 98.3°F | Ht 67.0 in | Wt 234.9 lb

## 2016-10-15 DIAGNOSIS — I10 Essential (primary) hypertension: Secondary | ICD-10-CM

## 2016-10-15 DIAGNOSIS — L509 Urticaria, unspecified: Secondary | ICD-10-CM

## 2016-10-15 DIAGNOSIS — E785 Hyperlipidemia, unspecified: Secondary | ICD-10-CM

## 2016-10-15 DIAGNOSIS — E119 Type 2 diabetes mellitus without complications: Secondary | ICD-10-CM

## 2016-10-15 DIAGNOSIS — E669 Obesity, unspecified: Secondary | ICD-10-CM

## 2016-10-15 DIAGNOSIS — IMO0001 Reserved for inherently not codable concepts without codable children: Secondary | ICD-10-CM

## 2016-10-15 DIAGNOSIS — F325 Major depressive disorder, single episode, in full remission: Secondary | ICD-10-CM

## 2016-10-15 DIAGNOSIS — Z6836 Body mass index (BMI) 36.0-36.9, adult: Secondary | ICD-10-CM

## 2016-10-15 LAB — BASIC METABOLIC PANEL
BUN: 11 mg/dL (ref 6–23)
CALCIUM: 9.2 mg/dL (ref 8.4–10.5)
CO2: 29 mEq/L (ref 19–32)
CREATININE: 0.81 mg/dL (ref 0.40–1.20)
Chloride: 101 mEq/L (ref 96–112)
GFR: 92.45 mL/min (ref >60.00)
Glucose, Bld: 115 mg/dL — ABNORMAL HIGH (ref 70–99)
Potassium: 4.4 mEq/L (ref 3.5–5.1)
Sodium: 136 mEq/L (ref 135–145)

## 2016-10-15 LAB — CBC
HEMATOCRIT: 39.9 % (ref 36.0–46.0)
Hemoglobin: 13.1 g/dL (ref 12.0–15.0)
MCHC: 32.8 g/dL (ref 30.0–36.0)
MCV: 83 fl (ref 78.0–100.0)
Platelets: 255 10*3/uL (ref 150.0–400.0)
RBC: 4.81 Mil/uL (ref 3.87–5.11)
RDW: 16.2 % — AB (ref 11.5–15.5)
WBC: 5.6 10*3/uL (ref 4.0–10.5)

## 2016-10-15 NOTE — Patient Instructions (Signed)
BEFORE YOU LEAVE: -order cologuard test -update allergies: pt reports hives with eggs, flu shot, peanuts -follow up: 1)Physical exam in 3 months, come fasting if convenient and morning appointment  We ordered the Cologuard test for colon cancer screening. Please complete this test promptly once the kit arrives. Please contact us if you have not received your kit in the next few weeks.  Please call your eye doctor today to schedule a diabetic eye exam  Please see the allergist for testing  We have ordered labs or studies at this visit. It can take up to 1-2 weeks for results and processing. IF results require follow up or explanation, we will call you with instructions. Clinically stable results will be released to your [facility]. If you have not heard from [place] or cannot find your results in [facility] in 2 weeks please contact our office at [phone].  If you are not yet signed up for [facility], please consider signing up.   We recommend the following healthy lifestyle for LIFE: 1) Small portions.   Tip: eat off of a salad plate instead of a dinner plate.  Tip: It is ok to feel hungry after a meal of proper portion sizes  Tip: if you need more or a snack choose fruits, veggies and/or a handful of nuts or seeds.  2) Eat a healthy clean diet.  * Tip: Avoid (less then 1 serving per week): processed foods, sweets, sweetened drinks, white starches (rice, flour, bread, potatoes, pasta, etc), red meat, fast foods, butter  *Tip: CHOOSE instead   * 5-9 servings per day of fresh or frozen fruits and vegetables (but not corn, potatoes, bananas, canned or dried fruit)   *nuts and seeds, beans   *olives and olive oil   *small portions of lean meats such as fish and white chicken    *small portions of whole grains  3)Get at least 150 minutes of sweaty aerobic exercise per week.  4)Reduce stress - consider counseling, meditation and relaxation to balance other aspects of your life.

## 2016-10-15 NOTE — Progress Notes (Signed)
Pre visit review using our clinic review tool, if applicable. No additional management support is needed unless otherwise documented below in the visit note. 

## 2017-01-12 NOTE — Progress Notes (Signed)
HPI:  Here for CPE:  -Concerns and/or follow up today:  Due for labs, colon cancer screening, eye exam, hep c screen. Declines vaccines - reaction to flu shot in the past. Advised assistant to order cologard last visit - pt reports this is not covered by insurance. She is doing well except grieving the loss of an Aunt and and Uncle 1 month ago. Has good family and church support. Does not feel needs counseling. Feels depression well controlled despite these losses. She agrees to schedule eye exam.  Resistant HTN/Hypokalemia: -meds: norvasc 10, coreg 12.5, losartan 100, aldactone 25 -diuretic caused severe hypokal - now resolved, renin/ald ok -monitors BP at home, Sees nephrologist on regular basis   HLD/Obesity: -meds: pravastatin  Hx of diabetes:  -dx with diabetes in 2012  -was on metformin but then taken off as improved with diet and exercise  -Sees Dr. Einar Gip in optho  MDD-mild/GAD: -meds: prozac '40mg'$  daily -mother sick with dementia   Occasional hives: -Thinks is due to certain foods -No swelling of the throat face or tongue, no trouble breathing -Feels cannot eat bananas, milk, eggs and peanuts  and other foods -Reports hives after getting a flu shot -advised allergist evaluation in the past   -Diet: variety of foods, balance and well rounded, larger portion sizes  -Exercise: some regular exercise  -Taking folic acid, vitamin D or calcium: no  -Diabetes and Dyslipidemia Screening: FASTING for labs  -Vaccines: had allergic reaction in the past, refuses  -pap history: sees Dr. Ruthann Cancer at [facility]  -sexual activity: yes, female partner, no new partners  -wants STI testing (Hep C if born [birth year]): no to STI testing, agrees to hep c screen  -FH breast, colon or ovarian ca: see FH Last mammogram: birads 1 Last colon cancer screening: desires referral for colonoscopy after discussion options, reports insurance told her they will not cover  cologard  -Alcohol, Tobacco, drug use: see social history  Review of Systems - no fevers, unintentional weight loss, vision loss, hearing loss, chest pain, sob, hemoptysis, melena, hematochezia, hematuria, genital discharge, changing or concerning skin lesions, bleeding, bruising, loc, thoughts of self harm or SI  Past Medical History:  Diagnosis Date  . Allergy   . Arthritis   . Depression   . Esophageal reflux 07/21/2014  . Frozen shoulder 05/10/2015   Injected 05/10/2015   . Obesity, unspecified 07/21/2014  . Resistant hypertension 07/21/2014  . Type 2 diabetes mellitus without complication ([place]) 10/29/5850    Past Surgical History:  Procedure Laterality Date  . LAPAROSCOPIC ABDOMINAL EXPLORATION    . miscarr      No family history on file.  Social History   Social History  . Marital status: Divorced    Spouse name: N/A  . Number of children: N/A  . Years of education: N/A   Social History Main Topics  . Smoking status: Never Smoker  . Smokeless tobacco: Never Used  . Alcohol use None  . Drug use: Unknown  . Sexual activity: Not Asked   Other Topics Concern  . 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  amLODipine (NORVASC) 10 MG tablet, take 1 tablet by mouth  once daily, Disp: 30 tablet, Rfl: 11 .  aspirin 81 MG tablet, Take 81 mg by mouth daily., Disp: , Rfl:  .  carvedilol (COREG) 12.5 MG tablet, take 1 tablet by mouth twice a day with meals, Disp: 60 tablet, Rfl: 11 .  cholecalciferol (VITAMIN D) 1000 UNITS tablet, Take 4,000 Units by mouth daily. , Disp: , Rfl:  .  FLUoxetine (PROZAC) 20 MG tablet, take 2 tablets by mouth once daily, Disp: 180 tablet, Rfl: 1 .  losartan (COZAAR) 100 MG tablet, take 1 tablet by mouth once daily, Disp: 30  tablet, Rfl: 11 .  MELATONIN PO, Take 1 mg by mouth at bedtime., Disp: , Rfl:  .  omeprazole (PRILOSEC) 40 MG capsule, take 1 capsule by mouth once daily, Disp: 90 capsule, Rfl: 1 .  pravastatin (PRAVACHOL) 20 MG tablet, take 1 tablet by mouth once daily, Disp: 90 tablet, Rfl: 1 .  spironolactone (ALDACTONE) 25 MG tablet, Take 12.5 mg by mouth 2 (two) times daily., Disp: , Rfl: 0  EXAM:  Vitals:   01/13/17 0840  BP: 132/80  Pulse: 72  Temp: 98.3 F (36.8 C)  Body mass index is 38.25 kg/m.   GENERAL: vitals reviewed and listed below, alert, oriented, appears well hydrated and in no acute distress  HEENT: head atraumatic, PERRLA, normal appearance of eyes, ears, nose and mouth. moist mucus membranes.  NECK: supple, no masses or lymphadenopathy  LUNGS: clear to auscultation bilaterally, no rales, rhonchi or wheeze  CV: HRRR, no peripheral edema or cyanosis, normal pedal pulses  ABDOMEN: bowel sounds normal, soft, non tender to palpation, no masses, no rebound or guarding  SKIN: no rash or abnormal lesions  GU/BREAST: declined  MS: normal gait, moves all extremities normally  NEURO: normal gait, speech and thought processing grossly intact, muscle tone grossly intact throughout  PSYCH: normal affect, pleasant and cooperative  ASSESSMENT AND PLAN:  Discussed the following assessment and plan:  Encounter for preventive health examination  Type 2 diabetes mellitus without complication, without long-term current use of insulin (HCC) - Plan: Hemoglobin A1c  Class 2 obesity due to excess calories with serious comorbidity and body mass index (BMI) of 38.0 to 38.9 in adult  Resistant hypertension - Plan: Basic metabolic panel, CBC  Hyperlipidemia, unspecified hyperlipidemia type - Plan: Lipid panel  Major depressive disorder with single episode, in full remission ([place])  Hepatitis C screning for baby boomer - Plan: Hepatitis C antibody  Colon cancer screening - Plan:  Ambulatory referral to Gastroenterology  -supported and counseled on loss, offered bereavement counseling and she knows this is available if needed  -Discussed and advised all [place] preventive services health task force level A and B recommendations for age, sex and risks.  -Advised at least 150 minutes of exercise per week and a healthy diet with avoidance of (less then 1 serving per week) processed foods, white starches, red meat, fast foods and sweets and consisting of: * 5-9 servings of fresh fruits and vegetables (not corn or potatoes) *nuts and seeds, beans *olives and olive oil *lean meats such as fish and white chicken  *whole grains  -labs, studies and vaccines per orders this encounter  Orders Placed This Encounter  Procedures  . Basic metabolic panel  . CBC  . Hemoglobin A1c  . Lipid panel  . Hepatitis C antibody  . Ambulatory referral to Gastroenterology    Referral Priority:   Routine    Referral Type:   Consultation    Referral Reason:  Specialty Services Required    Number of Visits Requested:   1    Patient advised to return to clinic immediately if symptoms worsen or persist or new concerns.  Patient Instructions  BEFORE YOU LEAVE: -follow up: 3-4 months -labs  -We placed a referral for you as discussed for the colonoscopy. It usually takes about 1-2 weeks to process and schedule this referral. If you have not heard from [place] regarding this appointment in 2 weeks please contact our office.  -please see your gynecologist yearly  -Vit D3 [phone] IU daily  -We have ordered labs or studies at this visit. It can take up to 1-2 weeks for results and processing. IF results require follow up or explanation, we will call you with instructions. Clinically stable results will be released to your [facility]. If you have not heard from [place] or cannot find your results in [facility] in 2 weeks please contact our office at [phone].  If you are not yet signed up for [facility],  please consider signing up.   We recommend the following healthy lifestyle for LIFE: 1) Small portions.   Tip: eat off of a salad plate instead of a dinner plate.  Tip: if you need more or a snack choose fruits, veggies and/or a handful of nuts or seeds.  2) Eat a healthy clean diet.  * Tip: Avoid (less then 1 serving per week): processed foods, sweets, sweetened drinks, white starches (rice, flour, bread, potatoes, pasta, etc), red meat, fast foods, butter  *Tip: CHOOSE instead   * 5-9 servings per day of fresh or frozen fruits and vegetables (but not corn, potatoes, bananas, canned or dried fruit)   *nuts and seeds, beans   *olives and olive oil   *small portions of lean meats such as fish and white chicken    *small portions of whole grains  3)Get at least 150 minutes of sweaty aerobic exercise per week.  4)Reduce stress - consider counseling, meditation and relaxation to balance other aspects of your life.            No Follow-up on file.  Colin Benton R., DO

## 2017-01-13 ENCOUNTER — Ambulatory Visit (INDEPENDENT_AMBULATORY_CARE_PROVIDER_SITE_OTHER): Payer: BLUE CROSS/BLUE SHIELD | Admitting: Family Medicine

## 2017-01-13 ENCOUNTER — Encounter: Payer: Self-pay | Admitting: Family Medicine

## 2017-01-13 VITALS — BP 132/80 | HR 72 | Temp 98.3°F | Ht 66.0 in | Wt 237.0 lb

## 2017-01-13 DIAGNOSIS — F325 Major depressive disorder, single episode, in full remission: Secondary | ICD-10-CM | POA: Diagnosis not present

## 2017-01-13 DIAGNOSIS — Z6838 Body mass index (BMI) 38.0-38.9, adult: Secondary | ICD-10-CM | POA: Diagnosis not present

## 2017-01-13 DIAGNOSIS — I10 Essential (primary) hypertension: Secondary | ICD-10-CM

## 2017-01-13 DIAGNOSIS — Z0000 Encounter for general adult medical examination without abnormal findings: Secondary | ICD-10-CM

## 2017-01-13 DIAGNOSIS — E119 Type 2 diabetes mellitus without complications: Secondary | ICD-10-CM

## 2017-01-13 DIAGNOSIS — Z1211 Encounter for screening for malignant neoplasm of colon: Secondary | ICD-10-CM | POA: Diagnosis not present

## 2017-01-13 DIAGNOSIS — Z7289 Other problems related to lifestyle: Secondary | ICD-10-CM

## 2017-01-13 DIAGNOSIS — E6609 Other obesity due to excess calories: Secondary | ICD-10-CM | POA: Diagnosis not present

## 2017-01-13 DIAGNOSIS — IMO0001 Reserved for inherently not codable concepts without codable children: Secondary | ICD-10-CM

## 2017-01-13 DIAGNOSIS — E785 Hyperlipidemia, unspecified: Secondary | ICD-10-CM

## 2017-01-13 LAB — BASIC METABOLIC PANEL
BUN: 14 mg/dL (ref 6–23)
CALCIUM: 9.5 mg/dL (ref 8.4–10.5)
CO2: 29 meq/L (ref 19–32)
Chloride: 103 mEq/L (ref 96–112)
Creatinine, Ser: 0.74 mg/dL (ref 0.40–1.20)
GFR: 102.53 mL/min (ref >60.00)
Glucose, Bld: 117 mg/dL — ABNORMAL HIGH (ref 70–99)
Potassium: 4.2 mEq/L (ref 3.5–5.1)
SODIUM: 139 meq/L (ref 135–145)

## 2017-01-13 LAB — CBC
HCT: 39.7 % (ref 36.0–46.0)
Hemoglobin: 13 g/dL (ref 12.0–15.0)
MCHC: 32.9 g/dL (ref 30.0–36.0)
MCV: 84 fl (ref 78.0–100.0)
PLATELETS: 240 10*3/uL (ref 150.0–400.0)
RBC: 4.73 Mil/uL (ref 3.87–5.11)
RDW: 17 % — ABNORMAL HIGH (ref 11.5–15.5)
WBC: 4.8 10*3/uL (ref 4.0–10.5)

## 2017-01-13 LAB — LIPID PANEL
CHOLESTEROL: 147 mg/dL (ref 0–200)
HDL: 61 mg/dL (ref >39.00)
LDL CALC: 74 mg/dL (ref 0–99)
NonHDL: 85.65
TRIGLYCERIDES: 57 mg/dL (ref 0.0–149.0)
Total CHOL/HDL Ratio: 2
VLDL: 11.4 mg/dL (ref 0.0–40.0)

## 2017-01-13 LAB — HEPATITIS C ANTIBODY: HCV Ab: NEGATIVE

## 2017-01-13 LAB — HEMOGLOBIN A1C: HEMOGLOBIN A1C: 6.2 % (ref 4.6–6.5)

## 2017-01-13 NOTE — Patient Instructions (Signed)
BEFORE YOU LEAVE: -follow up: 3-4 months -labs  -We placed a referral for you as discussed for the colonoscopy. It usually takes about 1-2 weeks to process and schedule this referral. If you have not heard from [place] regarding this appointment in 2 weeks please contact our office.  -please see your gynecologist yearly  -Vit D3 [phone] IU daily  -We have ordered labs or studies at this visit. It can take up to 1-2 weeks for results and processing. IF results require follow up or explanation, we will call you with instructions. Clinically stable results will be released to your [facility]. If you have not heard from [place] or cannot find your results in [facility] in 2 weeks please contact our office at [phone].  If you are not yet signed up for [facility], please consider signing up.   We recommend the following healthy lifestyle for LIFE: 1) Small portions.   Tip: eat off of a salad plate instead of a dinner plate.  Tip: if you need more or a snack choose fruits, veggies and/or a handful of nuts or seeds.  2) Eat a healthy clean diet.  * Tip: Avoid (less then 1 serving per week): processed foods, sweets, sweetened drinks, white starches (rice, flour, bread, potatoes, pasta, etc), red meat, fast foods, butter  *Tip: CHOOSE instead   * 5-9 servings per day of fresh or frozen fruits and vegetables (but not corn, potatoes, bananas, canned or dried fruit)   *nuts and seeds, beans   *olives and olive oil   *small portions of lean meats such as fish and white chicken    *small portions of whole grains  3)Get at least 150 minutes of sweaty aerobic exercise per week.  4)Reduce stress - consider counseling, meditation and relaxation to balance other aspects of your life.

## 2017-01-13 NOTE — Progress Notes (Signed)
Pre visit review using our clinic review tool, if applicable. No additional management support is needed unless otherwise documented below in the visit note. 

## 2017-01-20 ENCOUNTER — Other Ambulatory Visit: Payer: Self-pay | Admitting: Family Medicine

## 2017-01-20 DIAGNOSIS — Z1231 Encounter for screening mammogram for malignant neoplasm of breast: Secondary | ICD-10-CM

## 2017-02-09 ENCOUNTER — Other Ambulatory Visit: Payer: Self-pay | Admitting: Family Medicine

## 2017-02-13 ENCOUNTER — Encounter: Payer: Self-pay | Admitting: Family Medicine

## 2017-02-16 ENCOUNTER — Other Ambulatory Visit: Payer: Self-pay | Admitting: Family Medicine

## 2017-02-18 ENCOUNTER — Telehealth: Payer: Self-pay | Admitting: Family Medicine

## 2017-02-18 NOTE — Telephone Encounter (Signed)
Received a call from [facility] stating they received the prescription for Prozac tablets but the copay way too expensive for the pt.  Needed to change to capsules for much lower copyay.  Instructed pharmacist to proceed with the change.  Will update in the system.

## 2017-02-28 ENCOUNTER — Ambulatory Visit
Admission: RE | Admit: 2017-02-28 | Discharge: 2017-02-28 | Disposition: A | Discharge status: Home / Self Care | Payer: BLUE CROSS/BLUE SHIELD | Source: Ambulatory Visit | Attending: Family Medicine | Admitting: Family Medicine

## 2017-02-28 DIAGNOSIS — Z1231 Encounter for screening mammogram for malignant neoplasm of breast: Secondary | ICD-10-CM

## 2017-05-15 ENCOUNTER — Ambulatory Visit: Payer: BLUE CROSS/BLUE SHIELD | Admitting: Family Medicine

## 2017-05-25 NOTE — Progress Notes (Signed)
[facility] [address], [place] [address] Phone: [phone] Subjective:    I'm seeing this patient by the request  of:  [name], DO   CC: left knee pain  TOI:[id number]  Y[facility] is a 61 y.o. female coming in with complaint of left knee pain. Patient has had this for quite some time. Seems to be on the posterior aspect of the leg. Seems to be worsening over the course last 2 weeks. Seems to be associated with some swelling. Patient states with any type of weightbearing and seems to worse. Sometimes throbbing aching pain at night as well. Does not remember any true injury. Paresis severity pain is 6 out of 10 and worsening    Past Medical History:  Diagnosis Date  . Allergy   . Arthritis   . Depression   . Esophageal reflux 07/21/2014  . Frozen shoulder 05/10/2015   Injected 05/10/2015   . Obesity, unspecified 07/21/2014  . Resistant hypertension 07/21/2014  . Type 2 diabetes mellitus without complication ([place]) 3/38/2505   Past Surgical History:  Procedure Laterality Date  . LAPAROSCOPIC ABDOMINAL EXPLORATION    . miscarr     Social History   Social History  . Marital status: Divorced    Spouse name: N/A  . Number of children: N/A  . Years of education: N/A   Social History Main Topics  . Smoking status: Never Smoker  . Smokeless tobacco: Never Used  . Alcohol use Not on file  . Drug use: Unknown  . Sexual activity: Not on file   Other Topics Concern  . 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 Eggs Or Egg-Derived Products Hives  . Influenza Vaccines Hives  . Peanut-Containing Drug Products Hives  . Penicillins Hives  . Sulfa Antibiotics Hives   Family History  Problem Relation Age of Onset    . Breast cancer Neg Hx      Past medical history, social, surgical and family history all reviewed in electronic medical record.  No pertanent information unless stated regarding to the chief complaint.   Review of Systems:Review of systems updated and as accurate as of 05/25/17  No headache, visual changes, nausea, vomiting, diarrhea, constipation, dizziness, abdominal pain, skin rash, fevers, chills, night sweats, weight loss, swollen lymph nodes, body aches, joint swelling, chest pain, shortness of breath, mood changes.  Positive muscle aches Objective    There were no vitals taken for this visit.   Systems examined below as of 05/25/17   General: No apparent distress alert and oriented x3 mood and affect normal, dressed appropriately.  HEENT: Pupils equal, extraocular movements intact  Respiratory: Patient's speak in full sentences and does not appear short of breath  Cardiovascular: +1 lower extremity edema, non tender, no erythema  Skin: Warm dry intact with no signs of infection or rash on extremities or on axial skeleton.  Abdomen: Soft nontender  Neuro: Cranial nerves II through XII are intact, neurovascularly intact in all extremities with 2+ DTRs and 2+ pulses.  Lymph: No lymphadenopathy of posterior or anterior cervical chain or axillae bilaterally.  Gait Mild antalgic gait MSK:  Non tender with full range of motion and good stability and symmetric strength and tone of shoulders, elbows, wrist, hip, and ankles bilaterally.  Knee:left  Mild effusion compared to the contralateral side Tender only in the popliteal fossa mild over the medial joint line ROM full in flexion and extension and lower leg rotation. Ligaments with solid consistent endpoints including ACL, PCL, LCL, MCL. Negative Mcmurray's, Apley's, and Thessalonian tests. painful patellar compression. Patellar glide mild crepitus. Patellar and quadriceps tendons unremarkable. Hamstring and quadriceps strength  is normal.   MSK US performed of: left  This study was ordered, performed, and interpreted by Charlann Boxer D.O.  Knee: Mild narrowing of the medial joint line. Patient does have lower extremity swelling seems to be more in the neck. Trace effusion of the knee noted. Mild narrowing of the patellofemoral joint.  IMPRESSION: Mild lower extremity edema with mild arthritis been fairly unremarkable.    Impression and Recommendations:     This case required medical decision making of moderate complexity.      Note: This dictation was prepared with Dragon dictation along with smaller phrase technology. Any transcriptional errors that result from this process are unintentional.

## 2017-05-26 ENCOUNTER — Encounter: Payer: Self-pay | Admitting: Family Medicine

## 2017-05-26 ENCOUNTER — Ambulatory Visit (INDEPENDENT_AMBULATORY_CARE_PROVIDER_SITE_OTHER): Payer: BLUE CROSS/BLUE SHIELD | Admitting: Family Medicine

## 2017-05-26 ENCOUNTER — Ambulatory Visit: Payer: Self-pay

## 2017-05-26 VITALS — BP 142/84 | HR 53 | Ht 67.0 in | Wt 244.0 lb

## 2017-05-26 DIAGNOSIS — M7989 Other specified soft tissue disorders: Secondary | ICD-10-CM | POA: Diagnosis not present

## 2017-05-26 DIAGNOSIS — M1712 Unilateral primary osteoarthritis, left knee: Secondary | ICD-10-CM | POA: Diagnosis not present

## 2017-05-26 DIAGNOSIS — M25562 Pain in left knee: Secondary | ICD-10-CM

## 2017-05-26 DIAGNOSIS — M79605 Pain in left leg: Secondary | ICD-10-CM | POA: Diagnosis not present

## 2017-05-26 MED ORDER — FUROSEMIDE 20 MG PO TABS: 20.0000 mg | ORAL_TABLET | Freq: Every day | ORAL | 3 refills | Status: DC

## 2017-05-26 NOTE — Assessment & Plan Note (Signed)
Patient does have some degenerative left knee. Discussed icing regimen and home exercises. Discussed objective recently do a which ones to avoid. Patient will increase activity as tolerated. Topical anti-inflammatories given. Discussed over-the-counter natural supplementations.

## 2017-05-26 NOTE — Assessment & Plan Note (Signed)
Patient does have some pain and swelling. Discussed with patient at great length. We discussed icing regimen and home exercises. We discussed which activities doing which ones to avoid. Patient will increase activity as tolerated. Doppler ordered to rule out any type of clot. Likely some movement is secondary to immobilization the patient herself or protecting the knee. Possibly some exacerbation of the underlying arthritis. All began in 3 weeks

## 2017-05-26 NOTE — Patient Instructions (Signed)
Good to see you  Lasix daily for 3 days then as needed pennsaid pinkie amount topically 2 times daily as needed.   Possibly a thigh compression sleeve would be good.  We will get ultrasound of leg to make sure you are doing well and no clot See me again in 3 weeks

## 2017-05-29 ENCOUNTER — Ambulatory Visit: Payer: BLUE CROSS/BLUE SHIELD | Admitting: Family Medicine

## 2017-06-03 ENCOUNTER — Ambulatory Visit (HOSPITAL_COMMUNITY)
Admission: RE | Admit: 2017-06-03 | Discharge: 2017-06-03 | Disposition: A | Discharge status: Home / Self Care | Payer: BLUE CROSS/BLUE SHIELD | Source: Ambulatory Visit | Attending: Cardiovascular Disease | Admitting: Cardiovascular Disease

## 2017-06-03 DIAGNOSIS — M25562 Pain in left knee: Secondary | ICD-10-CM | POA: Diagnosis not present

## 2017-06-05 ENCOUNTER — Telehealth: Payer: Self-pay | Admitting: *Deleted

## 2017-06-05 NOTE — Telephone Encounter (Signed)
Ice 20 minutes every 2 hours.  Try elevation.  If needs to be seen maybe Ysidro Evert  Otherwise lets look for a cancellation

## 2017-06-05 NOTE — Telephone Encounter (Signed)
Pt called stating that she fell over her rug last night & has now having pain in her right now. Pt was seen last week for her left knee. Pt states that her pain is so bad she is having to leave work. Pt denies any swelling but states that it hurts to bend.

## 2017-06-05 NOTE — Telephone Encounter (Signed)
lmovm for pt to return call.  

## 2017-06-11 ENCOUNTER — Other Ambulatory Visit: Payer: Self-pay | Admitting: Family Medicine

## 2017-06-16 ENCOUNTER — Encounter: Payer: Self-pay | Admitting: Family Medicine

## 2017-06-16 ENCOUNTER — Ambulatory Visit: Payer: Self-pay

## 2017-06-16 ENCOUNTER — Other Ambulatory Visit: Payer: BLUE CROSS/BLUE SHIELD

## 2017-06-16 ENCOUNTER — Ambulatory Visit (INDEPENDENT_AMBULATORY_CARE_PROVIDER_SITE_OTHER): Payer: BLUE CROSS/BLUE SHIELD | Admitting: Family Medicine

## 2017-06-16 VITALS — BP 122/82 | HR 60 | Ht 66.0 in | Wt 245.0 lb

## 2017-06-16 DIAGNOSIS — M7122 Synovial cyst of popliteal space [Baker], left knee: Secondary | ICD-10-CM

## 2017-06-16 DIAGNOSIS — G8929 Other chronic pain: Secondary | ICD-10-CM

## 2017-06-16 DIAGNOSIS — M25562 Pain in left knee: Principal | ICD-10-CM

## 2017-06-16 DIAGNOSIS — M1712 Unilateral primary osteoarthritis, left knee: Secondary | ICD-10-CM | POA: Diagnosis not present

## 2017-06-16 NOTE — Assessment & Plan Note (Signed)
Patient was given an injection. Tolerated the procedure well. Didn't do aspiration and we will send to lab to further evaluate make sure there is no other organic causes such as gout. We discussed icing regimen, compression RTC in 4 weeks.

## 2017-06-16 NOTE — Patient Instructions (Addendum)
Good to see you  Drained the knee today and should do great  Continue the home exercises Compression sleeve like from [facility] for the knee may help  pennsaid pinkie amount topically 2 times daily as needed.   See me again in 4-6 weeks.

## 2017-06-16 NOTE — Progress Notes (Signed)
[facility] [place], [place] [address] Phone: [phone] Subjective:    I'm seeing this patient by the request  of:  [name], DO   CC: left knee pain f/u  [id number]  Yvette Roberts is a 61 y.o. female coming in with complaint of left knee pain. Was having pain on the posterior aspect the leg. Patient was found to have a Baker cyst. Patient states pain seems to be better but still has decreasing range of motion. Patient has no longer having worsening pain that she was having previously. Feels that the over-the-counter medications have been beneficial. Patient will be traveling around like to be able to walk regular.    Past Medical History:  Diagnosis Date  . Allergy   . Arthritis   . Depression   . Esophageal reflux 07/21/2014  . Frozen shoulder 05/10/2015   Injected 05/10/2015   . Obesity, unspecified 07/21/2014  . Resistant hypertension 07/21/2014  . Type 2 diabetes mellitus without complication ([place]) 6/50/3546   Past Surgical History:  Procedure Laterality Date  . LAPAROSCOPIC ABDOMINAL EXPLORATION    . miscarr     Social History   Social History  . Marital status: Divorced    Spouse name: N/A  . Number of children: N/A  . Years of education: N/A   Social History Main Topics  . Smoking status: Never Smoker  . Smokeless tobacco: Never Used  . Alcohol use None  . Drug use: Unknown  . Sexual activity: Not Asked   Other Topics Concern  . 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 Eggs Or Egg-Derived Products Hives  . Influenza Vaccines Hives  . Peanut-Containing Drug Products Hives  . Penicillins Hives  . Sulfa Antibiotics Hives   Family History  Problem Relation Age of Onset  . Breast cancer Neg  Hx      Past medical history, social, surgical and family history all reviewed in electronic medical record.  No pertanent information unless stated regarding to the chief complaint.   Review of Systems: No headache, visual changes, nausea, vomiting, diarrhea, constipation, dizziness, abdominal pain, skin rash, fevers, chills, night sweats, weight loss, swollen lymph nodes, body aches, joint swelling, , chest pain, shortness of breath, mood changes. Positive muscle aches  Objective  Blood pressure 122/82, pulse 60, height 5\' 6"  (1.676 m), weight 245 lb (111.1 kg).   Systems examined below as of 06/16/17 General: NAD A&O x3 mood, affect normal  HEENT: Pupils equal, extraocular movements intact no nystagmus Respiratory: not short of breath at rest or with speaking Cardiovascular: No lower extremity edema, non tender Skin: Warm dry intact with no signs of infection or rash on extremities or on axial skeleton. Abdomen: Soft nontender, no masses Neuro: Cranial nerves  intact, neurovascularly intact in all extremities with 2+ DTRs and 2+ pulses. Lymph: No lymphadenopathy appreciated today  Gait normal with good balance and coordination.  MSK: Non tender with full range of motion and good stability and symmetric strength and tone of shoulders, elbows, wrist,  hips and ankles bilaterally.   Knee:left  Normal alignment  Tender only in the popliteal fossa mild over  the medial joint line ROM show mild loss of flexion  Ligaments with solid consistent endpoints including ACL, PCL, LCL, MCL. Negative Mcmurray's, Apley's, and Thessalonian tests. painful patellar compression. Patellar glide mild crepitus. Patellar and quadriceps tendons unremarkable. Hamstring and quadriceps strength is normal.    Procedure: Real-time Ultrasound Guided Injection of left knee Device: GE Logiq Q7 Ultrasound guided injection is preferred based studies that show increased duration, increased effect, greater accuracy,  decreased procedural pain, increased response rate, and decreased cost with ultrasound guided versus blind injection.  Verbal informed consent obtained.  Time-out conducted.  Noted no overlying erythema, induration, or other signs of local infection.  Skin prepped in a sterile fashion.  Local anesthesia: Topical Ethyl chloride.  With sterile technique and under real time ultrasound guidance: With a 22-gauge 2 inch needle patient was injected with 4 cc of 0.5% Marcaine and   aspirated 12 mL of strawlike fluid then injected 1 cc of Kenalog 40 mg/dL. This was from a superior lateral approach.  Completed without difficulty  Pain immediately resolved suggesting accurate placement of the medication.  Advised to call if fevers/chills, erythema, induration, drainage, or persistent bleeding.  Images permanently stored and available for review in the ultrasound unit.  Impression: Technically successful ultrasound guided injection.   Impression and Recommendations:     This case required medical decision making of moderate complexity.      Note: This dictation was prepared with Dragon dictation along with smaller phrase technology. Any transcriptional errors that result from this process are unintentional.

## 2017-06-21 LAB — BODY FLUID CULTURE
Gram Stain: NONE SEEN
ORGANISM ID, BACTERIA: NO GROWTH

## 2017-06-23 NOTE — Progress Notes (Signed)
HPI:  Yvette Roberts is a pleasant 61 y.o. here for follow up. Chronic medical problems summarized below were reviewed for changes. Reports she is doing well for the most part. Reports she is getting regular exercise and is trying to eat healthy. Has been seeing Dr. Tamala Roberts in sports medicine for the need, was found to have a Baker's cyst and reports she is doing better after treatment for this. She lives about 25-30 minutes from here. She used to come this way for work, but no longer works on the side of town. She is considering transitioning to an equal practice that is closer to her, and she does not have the time for this drive on a regular basis. She asked about other providers within [place] that may be closer to her. Denies CP, SOB, DOE, treatment intolerance or new symptoms.  Resistant HTN/Hypokalemia: -meds: norvasc 10, coreg 12.5, losartan 100, aldactone 25 -diuretic caused severe hypokal - now resolved, renin/ald ok -monitors BP at home, Sees nephrologist on regular basis   L knee pain: -referred to sports medicine   HLD/Obesity: -meds: pravastatin  Hx of diabetes:  -dx with diabetes in 2012  -was on metformin but then taken off as improved with diet and exercise  -Sees Dr. Einar Roberts in optho  MDD-mild/GAD: -meds: prozac 40mg  daily -mother sick with dementia   Occasional hives: -Thinks is due to certain foods -No swelling of the throat face or tongue, no trouble breathing -Feels cannot eat bananas, milk, eggs and peanuts and other foods -Reports hives after getting a flu shot -advised allergist evaluation in the past  [name]: See pertinent positives and negatives per HPI.  Past Medical History:  Diagnosis Date  . Allergy   . Arthritis   . Depression   . Esophageal reflux 07/21/2014  . Frozen shoulder 05/10/2015   Injected 05/10/2015   . Obesity, unspecified 07/21/2014  . Resistant hypertension 07/21/2014  . Type 2 diabetes mellitus without complication ([place])  7/67/2094    Past Surgical History:  Procedure Laterality Date  . LAPAROSCOPIC ABDOMINAL EXPLORATION    . miscarr      Family History  Problem Relation Age of Onset  . Breast cancer Neg Hx     Social History   Social History  . Marital status: Divorced    Spouse name: N/A  . Number of children: N/A  . Years of education: N/A   Social History Main Topics  . Smoking status: Never Smoker  . Smokeless tobacco: Never Used  . Alcohol use None  . Drug use: Unknown  . Sexual activity: Not Asked   Other Topics Concern  . 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  amLODipine (NORVASC) 10 MG tablet, take 1 tablet by mouth once daily, Disp: 30 tablet, Rfl: 5 .  aspirin 81 MG tablet, Take 81 mg by mouth daily., Disp: , Rfl:  .  carvedilol (COREG) 12.5 MG tablet, take 1 tablet by mouth twice a day with meals, Disp: 60 tablet, Rfl: 5 .  cholecalciferol (VITAMIN D) 1000 UNITS tablet, Take 4,000 Units by mouth daily. , Disp: , Rfl:  .  FLUoxetine (PROZAC) 20 MG capsule, Take 2 capsules (  40 mg total) by mouth daily., Disp: 180 capsule, Rfl: 1 .  furosemide (LASIX) 20 MG tablet, Take 1 tablet (20 mg total) by mouth daily., Disp: 30 tablet, Rfl: 3 .  losartan (COZAAR) 100 MG tablet, take 1 tablet by mouth once daily, Disp: 30 tablet, Rfl: 5 .  MELATONIN PO, Take 1 mg by mouth at bedtime., Disp: , Rfl:  .  omeprazole (PRILOSEC) 40 MG capsule, take 1 capsule by mouth once daily, Disp: 90 capsule, Rfl: 1 .  spironolactone (ALDACTONE) 25 MG tablet, Take 12.5 mg by mouth 2 (two) times daily., Disp: , Rfl: 0  EXAM:  Vitals:   06/24/17 1051  BP: 112/70  Pulse: (!) 53  Temp: 98.1 F (36.7 C)    Body mass index is 39.21 kg/m.  GENERAL: vitals reviewed and listed  above, alert, oriented, appears well hydrated and in no acute distress  HEENT: atraumatic, conjunttiva clear, no obvious abnormalities on inspection of external nose and ears  NECK: no obvious masses on inspection  LUNGS: clear to auscultation bilaterally, no wheezes, rales or rhonchi, good air movement  CV: HRRR, no peripheral edema  MS: moves all extremities without noticeable abnormality  PSYCH: pleasant and cooperative, no obvious depression or anxiety  ASSESSMENT AND PLAN:  Discussed the following assessment and plan:  Type 2 diabetes mellitus without complication, without long-term current use of insulin (HCC) - Plan: Hemoglobin A1c  Resistant hypertension - Plan: Basic metabolic panel, CBC  Hyperlipidemia, unspecified hyperlipidemia type  Major depressive disorder with single episode, in full remission (Yvette Roberts)  -labs today -Lifestyle recommendations -Reviewed health maintenance measures that are due, she agrees to call gastroenterology in GYN  -Continue current medications -Glad her knee is feeling better -Discussed other lobe our location that may be closer to her provided her with the names of several female providers at the [facility] location -Patient advised to return or notify a doctor immediately if symptoms worsen or persist or new concerns arise.  Patient Instructions  BEFORE YOU LEAVE: -PHq9 -number for GI to schedule colonosocpy -labs -follow up: here or with new provider in 3-4 months ( we will miss you!)  Please schedule your gynecology exam.  Please call to schedule your colonoscopy.  We have ordered labs or studies at this visit. It can take up to 1-2 weeks for results and processing. IF results require follow up or explanation, we will call you with instructions. Clinically stable results will be released to your [facility]. If you have not heard from [place] or cannot find your results in [facility] in 2 weeks please contact our office at [phone].  If you are  not yet signed up for [facility], please consider signing up.  WE NOW OFFER   [facility] FAST TRACK!!!  SAME DAY Appointments for ACUTE CARE  Such as: Sprains, Injuries, cuts, abrasions, rashes, muscle pain, joint pain, back pain Colds, flu, sore throats, headache, allergies, cough, fever  Ear pain, sinus and eye infections Abdominal pain, nausea, vomiting, diarrhea, upset stomach Animal/insect bites  3 Easy Ways to Schedule: Walk-In Scheduling Call in scheduling Mychart Sign-up: [email or web address]                   Colin Benton R., DO

## 2017-06-24 ENCOUNTER — Encounter: Payer: Self-pay | Admitting: Family Medicine

## 2017-06-24 ENCOUNTER — Ambulatory Visit (INDEPENDENT_AMBULATORY_CARE_PROVIDER_SITE_OTHER): Payer: BLUE CROSS/BLUE SHIELD | Admitting: Family Medicine

## 2017-06-24 VITALS — BP 112/70 | HR 53 | Temp 98.1°F | Ht 66.0 in | Wt 242.9 lb

## 2017-06-24 DIAGNOSIS — E875 Hyperkalemia: Secondary | ICD-10-CM

## 2017-06-24 DIAGNOSIS — E785 Hyperlipidemia, unspecified: Secondary | ICD-10-CM | POA: Diagnosis not present

## 2017-06-24 DIAGNOSIS — I10 Essential (primary) hypertension: Secondary | ICD-10-CM | POA: Diagnosis not present

## 2017-06-24 DIAGNOSIS — Z1389 Encounter for screening for other disorder: Secondary | ICD-10-CM | POA: Diagnosis not present

## 2017-06-24 DIAGNOSIS — E119 Type 2 diabetes mellitus without complications: Secondary | ICD-10-CM | POA: Diagnosis not present

## 2017-06-24 DIAGNOSIS — F325 Major depressive disorder, single episode, in full remission: Secondary | ICD-10-CM

## 2017-06-24 DIAGNOSIS — Z1331 Encounter for screening for depression: Secondary | ICD-10-CM

## 2017-06-24 LAB — BASIC METABOLIC PANEL
BUN: 12 mg/dL (ref 6–23)
CHLORIDE: 101 meq/L (ref 96–112)
CO2: 32 meq/L (ref 19–32)
Calcium: 9.5 mg/dL (ref 8.4–10.5)
Creatinine, Ser: 0.82 mg/dL (ref 0.40–1.20)
GFR: 90.94 mL/min (ref >60.00)
GLUCOSE: 103 mg/dL — AB (ref 70–99)
POTASSIUM: 5.3 meq/L — AB (ref 3.5–5.1)
SODIUM: 137 meq/L (ref 135–145)

## 2017-06-24 LAB — CBC
HCT: 40.4 % (ref 36.0–46.0)
HEMOGLOBIN: 13.3 g/dL (ref 12.0–15.0)
MCHC: 32.9 g/dL (ref 30.0–36.0)
MCV: 85.7 fl (ref 78.0–100.0)
PLATELETS: 287 10*3/uL (ref 150.0–400.0)
RBC: 4.71 Mil/uL (ref 3.87–5.11)
RDW: 15.9 % — ABNORMAL HIGH (ref 11.5–15.5)
WBC: 6.1 10*3/uL (ref 4.0–10.5)

## 2017-06-24 LAB — HEMOGLOBIN A1C: Hgb A1c MFr Bld: 6.1 % (ref 4.6–6.5)

## 2017-06-24 NOTE — Addendum Note (Signed)
Addended by: Agnes Lawrence on: 06/24/2017 05:06 PM   Modules accepted: Orders

## 2017-06-24 NOTE — Patient Instructions (Addendum)
BEFORE YOU LEAVE: -PHq9 -number for GI to schedule colonosocpy -labs -follow up: here or with new provider in 3-4 months ( we will miss you!)  Please schedule your gynecology exam.  Please call to schedule your colonoscopy.  We have ordered labs or studies at this visit. It can take up to 1-2 weeks for results and processing. IF results require follow up or explanation, we will call you with instructions. Clinically stable results will be released to your [facility]. If you have not heard from [place] or cannot find your results in [facility] in 2 weeks please contact our office at [phone].  If you are not yet signed up for [facility], please consider signing up.  WE NOW OFFER   [facility] FAST TRACK!!!  SAME DAY Appointments for ACUTE CARE  Such as: Sprains, Injuries, cuts, abrasions, rashes, muscle pain, joint pain, back pain Colds, flu, sore throats, headache, allergies, cough, fever  Ear pain, sinus and eye infections Abdominal pain, nausea, vomiting, diarrhea, upset stomach Animal/insect bites  3 Easy Ways to Schedule: Walk-In Scheduling Call in scheduling Mychart Sign-up: [email or web address]

## 2017-07-14 ENCOUNTER — Encounter: Payer: Self-pay | Admitting: Family Medicine

## 2017-07-14 ENCOUNTER — Ambulatory Visit (INDEPENDENT_AMBULATORY_CARE_PROVIDER_SITE_OTHER): Payer: BLUE CROSS/BLUE SHIELD | Admitting: Family Medicine

## 2017-07-14 DIAGNOSIS — M1711 Unilateral primary osteoarthritis, right knee: Secondary | ICD-10-CM | POA: Diagnosis not present

## 2017-07-14 DIAGNOSIS — M170 Bilateral primary osteoarthritis of knee: Secondary | ICD-10-CM | POA: Insufficient documentation

## 2017-07-14 MED ORDER — DICLOFENAC SODIUM 2 % TD SOLN: 2.0000 "application " | Freq: Two times a day (BID) | TRANSDERMAL | 3 refills | Status: DC

## 2017-07-14 NOTE — Patient Instructions (Signed)
Good to see you  Yvette Roberts is your friend Stay active.  Keep doing the exercises Can repeat injections every 3 months if needed.  If not better can do synvisc injections.  See me again in 4 weeks if not better or see me every 3 months for injecitons otherwise.

## 2017-07-14 NOTE — Progress Notes (Signed)
[facility] [address] [place], [place] [address] Phone: [phone] Subjective:    I'm seeing this patient by the request  of:    CC: Left leg swelling follow-up  [id number]  Yvette Roberts is a 61 y.o. female coming in with complaint of left leg swelling. Patient was found to have a Baker cyst. Did have aspirations done one month ago. Patient was to do compression, icing, home exercises. Patient does have degenerative joint disease. Patient states  Left knee is feeling considerably better. Worsening having more pain in the right knee. Discusses the pain as a dull, throbbing aching pain. Patient states it can radiate down the leg somewhat. Not as severe as the other side. Still some tightness. When he does try to stay active.      Past Medical History:  Diagnosis Date  . Allergy   . Arthritis   . Depression   . Esophageal reflux 07/21/2014  . Frozen shoulder 05/10/2015   Injected 05/10/2015   . Obesity, unspecified 07/21/2014  . Resistant hypertension 07/21/2014  . Type 2 diabetes mellitus without complication ([place]) 5/80/9983   Past Surgical History:  Procedure Laterality Date  . LAPAROSCOPIC ABDOMINAL EXPLORATION    . miscarr     Social History   Social History  . Marital status: Divorced    Spouse name: N/A  . Number of children: N/A  . Years of education: N/A   Social History Main Topics  . Smoking status: Never Smoker  . Smokeless tobacco: Never Used  . Alcohol use Not on file  . Drug use: Unknown  . Sexual activity: Not on file   Other Topics Concern  . 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 Eggs Or Egg-Derived Products Hives  . Influenza Vaccines Hives  . Peanut-Containing Drug Products Hives  .  Penicillins Hives  . Sulfa Antibiotics Hives   Family History  Problem Relation Age of Onset  . Breast cancer Neg Hx      Past medical history, social, surgical and family history all reviewed in electronic medical record.  No pertanent information unless stated regarding to the chief complaint.   Review of Systems:Review of systems updated and as accurate as of 07/14/17  No headache, visual changes, nausea, vomiting, diarrhea, constipation, dizziness, abdominal pain, skin rash, fevers, chills, night sweats, weight loss, swollen lymph nodes, body aches, joint swelling, muscle aches, chest pain, shortness of breath, mood changes.   Objective    There were no vitals taken for this visit.   Systems examined below as of 07/14/17   General: No apparent distress alert and oriented x3 mood and affect normal, dressed appropriately.  HEENT: Pupils equal, extraocular movements intact  Respiratory: Patient's speak in full sentences and does not appear short of breath  Cardiovascular: No lower extremity edema, non tender, no erythema  Skin: Warm dry intact with no signs of infection or rash on extremities or on axial skeleton.  Abdomen: Soft nontender  Neuro: Cranial nerves II through XII are intact, neurovascularly intact in all extremities with 2+ DTRs and 2+ pulses.  Lymph: No lymphadenopathy of posterior or anterior cervical chain or  axillae bilaterally.  Gait Mild antalgic gait MSK:  Non tender with full range of motion and good stability and symmetric strength and tone of shoulders, elbows, wrist, hip, and ankles bilaterally.  Knee: Bilateral valgus deformity noted. Large thigh to calf ratio.  Tender to palpation over medial and PF joint line. More pain on the right side than left side ROM full in flexion and extension and lower leg rotation. instability with valgus force.  painful patellar compression. Patellar glide with moderate crepitus. Patellar and quadriceps tendons  unremarkable. Hamstring and quadriceps strength is normal.  After informed written and verbal consent, patient was seated on exam table. Right knee was prepped with alcohol swab and utilizing anterolateral approach, patient's right knee space was injected with 4:1  marcaine 0.5%: Kenalog 40mg /dL. Patient tolerated the procedure well without immediate complications.   Impression and Recommendations:     This case required medical decision making of moderate complexity.      Note: This dictation was prepared with Dragon dictation along with smaller phrase technology. Any transcriptional errors that result from this process are unintentional.

## 2017-07-14 NOTE — Assessment & Plan Note (Signed)
Right knee was injected. Discussed icing regimen, home exercises, which activities to do a which ones to avoid. Continue the topical anti-inflammatories. Patient was having more pain in the right lower extremity. Discussed the possibility of a Doppler ultrasound on this side which patient declined. Continues to have the swelling and encourage her to take the Lasix as needed. Follow-up with me again in 4 weeks. Could be a candidate for viscous supplementation.

## 2017-07-25 ENCOUNTER — Other Ambulatory Visit: Payer: BLUE CROSS/BLUE SHIELD

## 2017-08-06 ENCOUNTER — Other Ambulatory Visit (INDEPENDENT_AMBULATORY_CARE_PROVIDER_SITE_OTHER): Payer: BLUE CROSS/BLUE SHIELD

## 2017-08-06 DIAGNOSIS — E875 Hyperkalemia: Secondary | ICD-10-CM | POA: Diagnosis not present

## 2017-08-06 LAB — BASIC METABOLIC PANEL
BUN: 10 mg/dL (ref 6–23)
CO2: 31 mEq/L (ref 19–32)
Calcium: 9 mg/dL (ref 8.4–10.5)
Chloride: 100 mEq/L (ref 96–112)
Creatinine, Ser: 0.8 mg/dL (ref 0.40–1.20)
GFR: 93.53 mL/min (ref >60.00)
GLUCOSE: 108 mg/dL — AB (ref 70–99)
POTASSIUM: 4.4 meq/L (ref 3.5–5.1)
SODIUM: 137 meq/L (ref 135–145)

## 2017-08-11 ENCOUNTER — Ambulatory Visit (INDEPENDENT_AMBULATORY_CARE_PROVIDER_SITE_OTHER): Payer: BLUE CROSS/BLUE SHIELD | Admitting: Family Medicine

## 2017-08-11 ENCOUNTER — Other Ambulatory Visit: Payer: Self-pay

## 2017-08-11 ENCOUNTER — Encounter: Payer: Self-pay | Admitting: Family Medicine

## 2017-08-11 DIAGNOSIS — M170 Bilateral primary osteoarthritis of knee: Secondary | ICD-10-CM

## 2017-08-11 MED ORDER — VITAMIN D (ERGOCALCIFEROL) 1.25 MG (50000 UNIT) PO CAPS: 50000.0000 [IU] | ORAL_CAPSULE | ORAL | 0 refills | Status: DC

## 2017-08-11 NOTE — Patient Instructions (Signed)
Good to see you  Yvette Roberts is your friend.  Stay active We can consider bracing later on.  Ice is your friend.  We will see you the next 2 weeks for the other injections.

## 2017-08-11 NOTE — Assessment & Plan Note (Signed)
Failed all conservative therapy. Patient did start viscous supplementation bilaterally. We discussed the possibility of custom bracing which patient declined at this time. Has started in formal physical therapy in the past. We discussed topical anti-inflammatories and icing. We discussed that this seems to fail unfortunately surgical intervention may be necessary. Patient come back again in 1 week for second in a series of 3 injections.

## 2017-08-12 NOTE — Progress Notes (Addendum)
[name] [address] [place], [place] [address] Phone: [phone] Subjective:     CC: Bilateral knee pain follow-up  [id number]  Yvette Roberts is a 61 y.o. female coming in for follow up for   Patient is here for following up on her bilateral knee pain. Was found to have significant arthritis bilaterally. Since then the sternum injections gave some mild improvement for a week and now the pain worsening again. Starting to affect daily activities with increasing discomfort as well as instability    Past Medical History:  Diagnosis Date  . Allergy   . Arthritis   . Depression   . Esophageal reflux 07/21/2014  . Frozen shoulder 05/10/2015   Injected 05/10/2015   . Obesity, unspecified 07/21/2014  . Resistant hypertension 07/21/2014  . Type 2 diabetes mellitus without complication ([place]) 2/63/3354   Past Surgical History:  Procedure Laterality Date  . LAPAROSCOPIC ABDOMINAL EXPLORATION    . miscarr     Social History   Social History  . Marital status: Divorced    Spouse name: N/A  . Number of children: N/A  . Years of education: N/A   Social History Main Topics  . Smoking status: Never Smoker  . Smokeless tobacco: Never Used  . Alcohol use Not on file  . Drug use: Unknown  . Sexual activity: Not on file   Other Topics Concern  . 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 Eggs Or Egg-Derived Products Hives  . Influenza Vaccines Hives  . Peanut-Containing Drug Products Hives  . Penicillins Hives  . Sulfa Antibiotics Hives   Family History  Problem Relation Age of Onset  . Breast cancer Neg Hx      Past medical history, social, surgical and family history all reviewed in electronic medical record.  No  pertanent information unless stated regarding to the chief complaint.   Review of Systems: No headache, visual changes, nausea, vomiting, diarrhea, constipation, dizziness, abdominal pain, skin rash, fevers, chills, night sweats, weight loss, swollen lymph nodes, body aches, joint swelling, muscle aches, chest pain, shortness of breath, mood changes.    Objective    Blood pressure 118/84, pulse 61, height 5\' 6"  (1.676 m), weight 243 lb (110.2 kg), SpO2 98 %.     General: NAD A&O x3 mood, affect normal  HEENT: Pupils equal, extraocular movements intact no nystagmus Respiratory: not short of breath at rest or with speaking Cardiovascular: No lower extremity edema, non tender Skin: Warm dry intact with no signs of infection or rash on extremities or on axial skeleton. Abdomen: Soft nontender, no masses Neuro: Cranial nerves  intact, neurovascularly intact in all extremities with 2+ DTRs and 2+ pulses. Lymph: No lymphadenopathy appreciated today  Gait Mild antalgic MSK: Non tender with full range of motion and good stability and symmetric strength and tone of shoulders, elbows, wrist,  hips and ankles bilaterally.  Mild arthritic changes of multiple joints  Knee: Bilateral valgus deformity noted. Large thigh to calf ratio.  Tender to palpation over medial and PF joint line.  ROM full in flexion and extension and lower leg rotation. instability with valgus force.  painful patellar compression. Patellar glide with moderate crepitus. Patellar and quadriceps tendons unremarkable. Hamstring and quadriceps strength is normal.    After informed written and verbal consent, patient was seated on exam table. Right knee was prepped with alcohol swab and utilizing anterolateral approach, patient's right knee space was injected with16 mg/2.5 mL of Synvisc (sodium hyaluronate) in a prefilled syringe was injected easily into the knee through a 22-gauge needle..Patient tolerated the procedure well  without immediate complications.  After informed written and verbal consent, patient was seated on exam table. Left knee was prepped with alcohol swab and utilizing anterolateral approach, patient's left knee space was injected with16 mg/2.5 mL of Synvisc (sodium hyaluronate) in a prefilled syringe was injected easily into the knee through a 22-gauge needle.Patient tolerated the procedure well without immediate complications.

## 2017-08-16 NOTE — Progress Notes (Signed)
[name] [address] [place], [place] [address] Phone: [phone] Subjective:     CC: Bilateral knee pain follow-up  IRS:[id number]  Yvette Roberts is a 61 y.o. female coming in for follow up for   Patient is here for following up on her bilateral knee pain. Was found to have significant arthritis bilaterally. Vision failed all conservative therapy and is here for second in a series of 3 injections for viscous supplementation. Patient states that she is feeling better. She has more mobility. She is noticing that the left knee is swelling after begin on her feet for long periods of time.     Past Medical History:  Diagnosis Date  . Allergy   . Arthritis   . Depression   . Esophageal reflux 07/21/2014  . Frozen shoulder 05/10/2015   Injected 05/10/2015   . Obesity, unspecified 07/21/2014  . Resistant hypertension 07/21/2014  . Type 2 diabetes mellitus without complication ([facility]) 0/93/8182   Past Surgical History:  Procedure Laterality Date  . LAPAROSCOPIC ABDOMINAL EXPLORATION    . miscarr     Social History   Social History  . Marital status: Divorced    Spouse name: N/A  . Number of children: N/A  . Years of education: N/A   Social History Main Topics  . Smoking status: Never Smoker  . Smokeless tobacco: Never Used  . Alcohol use Not on file  . Drug use: Unknown  . Sexual activity: Not on file   Other Topics Concern  . 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 Eggs Or Egg-Derived Products Hives  . Influenza Vaccines Hives  . Peanut-Containing Drug Products Hives  . Penicillins Hives  . Sulfa Antibiotics Hives   Family History  Problem Relation Age of Onset  . Breast cancer Neg Hx      Past medical  history, social, surgical and family history all reviewed in electronic medical record.  No pertanent information unless stated regarding to the chief complaint.   Review of Systems: No headache, visual changes, nausea, vomiting, diarrhea, constipation, dizziness, abdominal pain, skin rash, fevers, chills, night sweats, weight loss, swollen lymph nodes, body aches, joint swelling, muscle aches, chest pain, shortness of breath, mood changes.    Objective  Blood pressure 130/88, pulse (!) 58, height 5\' 6"  (1.676 m), weight 250 lb (113.4 kg), SpO2 97 %.     Systems examined below as of 08/18/17 General: NAD A&O x3 mood, affect normal  HEENT: Pupils equal, extraocular movements intact no nystagmus Respiratory: not short of breath at rest or with speaking Cardiovascular: No lower extremity edema, non tender Skin: Warm dry intact with no signs of infection or rash on extremities or on axial skeleton. Abdomen: Soft nontender, no masses Neuro: Cranial nerves  intact, neurovascularly intact in all extremities with 2+ DTRs and 2+ pulses. Lymph: No lymphadenopathy appreciated today  Gait Mild antalgic MSK: Non tender with full range of motion and good stability and symmetric strength and tone of shoulders, elbows, wrist,  hips and ankles bilaterally.  Mild arthritic changes of multiple joints  Knee: Bilateral valgus deformity noted. Large thigh to calf ratio.  Tender to palpation over medial and PF joint line.  ROM full in flexion and extension and lower leg rotation. instability with valgus force.  painful patellar compression. Patellar glide with moderate crepitus. Patellar and quadriceps tendons unremarkable. Hamstring and quadriceps strength is normal.   After informed written and verbal consent, patient was seated on exam table. Right knee was prepped with alcohol swab and utilizing anterolateral approach, patient's right knee space was injected with 16 mg/2.5 mL of Synvisc (sodium  hyaluronate) in a prefilled syringe was injected easily into the knee through a 22-gauge needle. Patient tolerated the procedure well without immediate complications.  After informed written and verbal consent, patient was seated on exam table. Left knee was prepped with alcohol swab and utilizing anterolateral approach, patient's left knee space was injected with 16 mg/2.5 mL of Synvisc (sodium hyaluronate) in a prefilled syringe was injected easily into the knee through a 22-gauge needle.. Patient tolerated the procedure well without immediate complications.

## 2017-08-18 ENCOUNTER — Encounter: Payer: Self-pay | Admitting: Family Medicine

## 2017-08-18 ENCOUNTER — Ambulatory Visit (INDEPENDENT_AMBULATORY_CARE_PROVIDER_SITE_OTHER): Payer: BLUE CROSS/BLUE SHIELD | Admitting: Family Medicine

## 2017-08-18 DIAGNOSIS — M170 Bilateral primary osteoarthritis of knee: Secondary | ICD-10-CM | POA: Diagnosis not present

## 2017-08-18 NOTE — Assessment & Plan Note (Signed)
Second in a series of 3 injections given today. Tolerated the procedure well. Continue the other conservative therapy and follow-up in one week for third and final injections.

## 2017-08-20 ENCOUNTER — Other Ambulatory Visit: Payer: Self-pay | Admitting: Family Medicine

## 2017-08-25 ENCOUNTER — Encounter: Payer: Self-pay | Admitting: Family Medicine

## 2017-08-25 ENCOUNTER — Ambulatory Visit (INDEPENDENT_AMBULATORY_CARE_PROVIDER_SITE_OTHER): Payer: BLUE CROSS/BLUE SHIELD | Admitting: Family Medicine

## 2017-08-25 DIAGNOSIS — M170 Bilateral primary osteoarthritis of knee: Secondary | ICD-10-CM

## 2017-08-25 NOTE — Progress Notes (Signed)
[name] [address] [place], [place] [address] Phone: [phone] Subjective:    I'm seeing this patient by the request  of:    CC: bilateral knee pain  UXL:[id number]  XITLALY Yvette Roberts is a 61 y.o. female coming in for follow up for bilateral knee pain. She is discouraged as her left knee continues to bother her. Patient does have severe osteophytic changes. Having instability of the left knee. Patient wants to be more active but is finding it difficult with the pain.     Past Medical History:  Diagnosis Date  . Allergy   . Arthritis   . Depression   . Esophageal reflux 07/21/2014  . Frozen shoulder 05/10/2015   Injected 05/10/2015   . Obesity, unspecified 07/21/2014  . Resistant hypertension 07/21/2014  . Type 2 diabetes mellitus without complication ([facility]) 3/66/4403   Past Surgical History:  Procedure Laterality Date  . LAPAROSCOPIC ABDOMINAL EXPLORATION    . miscarr     Social History   Social History  . Marital status: Divorced    Spouse name: N/A  . Number of children: N/A  . Years of education: N/A   Social History Main Topics  . Smoking status: Never Smoker  . Smokeless tobacco: Never Used  . Alcohol use Not on file  . Drug use: Unknown  . Sexual activity: Not on file   Other Topics Concern  . 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 Eggs Or Egg-Derived Products Hives  . Influenza Vaccines Hives  . Peanut-Containing Drug Products Hives  . Penicillins Hives  . Sulfa Antibiotics Hives   Family History  Problem Relation Age of Onset  . Breast cancer Neg Hx      Past medical history, social, surgical and family history all reviewed in electronic medical record.  No pertanent information unless stated  regarding to the chief complaint.   Review of Systems:Review of systems updated and as accurate as of 08/25/17  No headache, visual changes, nausea, vomiting, diarrhea, constipation, dizziness, abdominal pain, skin rash, fevers, chills, night sweats, weight loss, swollen lymph nodes, body aches, joint swelling, muscle aches, chest pain, shortness of breath, mood changes.   Objective    There were no vitals taken for this visit.   Systems examined below as of 08/25/17   General: No apparent distress alert and oriented x3 mood and affect normal, dressed appropriately.  HEENT: Pupils equal, extraocular movements intact  Respiratory: Patient's speak in full sentences and does not appear short of breath  Cardiovascular: No lower extremity edema, non tender, no erythema  Skin: Warm dry intact with no signs of infection or rash on extremities or on axial skeleton.  Abdomen: Soft nontender  Neuro: Cranial nerves II through XII are intact, neurovascularly intact in all extremities with 2+ DTRs and 2+ pulses.  Lymph: No lymphadenopathy of posterior or anterior cervical chain or axillae bilaterally.  Gait Antalgic gait MSK:  Non tender with full range of motion and good stability and symmetric strength and tone of shoulders, elbows, wrist, hip, and ankles bilaterally.     Knee: Bilateral valgus deformity noted. Large thigh to calf ratio.  Tender to palpation over medial and PF joint line.  ROM full in flexion and extension and lower leg rotation. instability with valgus force.  painful patellar compression. Patellar glide with moderate crepitus. Patellar and quadriceps tendons unremarkable. Hamstring and quadriceps strength is normal.   After informed written and verbal consent, patient was seated on exam table. Right knee was prepped with alcohol swab and utilizing anterolateral approach, patient's right knee space was injected with 16 mg/2.5 mL of Synvisc (sodium hyaluronate) in a prefilled  syringe was injected easily into the knee through a 22-gauge needle.. Patient tolerated the procedure well without immediate complications.  After informed written and verbal consent, patient was seated on exam table. Left knee was prepped with alcohol swab and utilizing anterolateral approach, patient's left knee space was injected with 16 mg/2.5 mL of Synvisc (sodium hyaluronate) in a prefilled syringe was injected easily into the knee through a 22-gauge needle.. Patient tolerated the procedure well without immediate complications. Impression and Recommendations:     This case required medical decision making of moderate complexity.      Note: This dictation was prepared with Dragon dictation along with smaller phrase technology. Any transcriptional errors that result from this process are unintentional.

## 2017-08-25 NOTE — Assessment & Plan Note (Signed)
Bilateral knee injections. Patient finish the viscous supplementation. We will consider custom bracing. We discussed icing regimen. Following up again in 4 weeks.

## 2017-08-25 NOTE — Patient Instructions (Signed)
You made it  Sorry for being late Stay active.  See em again in 4 weeks.

## 2017-09-20 ENCOUNTER — Other Ambulatory Visit: Payer: Self-pay | Admitting: Family Medicine

## 2017-09-22 ENCOUNTER — Ambulatory Visit: Payer: BLUE CROSS/BLUE SHIELD | Admitting: Family Medicine

## 2017-10-03 ENCOUNTER — Other Ambulatory Visit: Payer: Self-pay

## 2017-10-03 ENCOUNTER — Telehealth: Payer: Self-pay | Admitting: Family Medicine

## 2017-10-03 MED ORDER — VITAMIN D (ERGOCALCIFEROL) 1.25 MG (50000 UNIT) PO CAPS: 50000.0000 [IU] | ORAL_CAPSULE | ORAL | 0 refills | Status: DC

## 2017-10-03 NOTE — Telephone Encounter (Signed)
Rx refilled per a verbal from Dr. Tamala Julian.

## 2017-10-03 NOTE — Telephone Encounter (Signed)
Copied from [facility] [id number]. Topic: Quick Communication - See Telephone Encounter >> Oct 03, 2017 10:33 AM Cleaster Corin, NT wrote: CRM for notification. See Telephone encounter for:   10/03/17. Pt. Called to ask Dr. Tamala Julian question about rx. vitamin D pill (1,000mg ) can she get a refill on that or just buy over the counter vitamin D (2,000) what she was taking before. Please give pt. A call to let her know which Vitamin D pill. If rx. Will be filled pt. Would like rx. [facility] [address] [place] [address] Phone: [phone] Fax: [phone] Not a 24 hour pharmacy; exact hours not known

## 2017-10-06 ENCOUNTER — Ambulatory Visit: Payer: BLUE CROSS/BLUE SHIELD | Admitting: Family Medicine

## 2017-10-24 ENCOUNTER — Ambulatory Visit: Payer: BLUE CROSS/BLUE SHIELD | Admitting: Family Medicine

## 2017-10-31 ENCOUNTER — Telehealth: Payer: Self-pay | Admitting: Family Medicine

## 2017-10-31 DIAGNOSIS — M170 Bilateral primary osteoarthritis of knee: Secondary | ICD-10-CM

## 2017-10-31 NOTE — Telephone Encounter (Signed)
Copied from [facility] [id number]. Topic: Referral - Request >> Oct 31, 2017  9:28 AM Ahmed Prima L wrote: Requesting a referral to go to [facility]. Call back is [phone]

## 2017-10-31 NOTE — Telephone Encounter (Signed)
[facility]

## 2017-10-31 NOTE — Telephone Encounter (Signed)
Referral send to [facility]

## 2017-11-08 ENCOUNTER — Other Ambulatory Visit: Payer: Self-pay | Admitting: Family Medicine

## 2017-11-10 NOTE — Telephone Encounter (Signed)
Refill denied 

## 2017-11-17 ENCOUNTER — Ambulatory Visit (INDEPENDENT_AMBULATORY_CARE_PROVIDER_SITE_OTHER): Payer: BLUE CROSS/BLUE SHIELD

## 2017-11-17 ENCOUNTER — Ambulatory Visit (INDEPENDENT_AMBULATORY_CARE_PROVIDER_SITE_OTHER): Payer: BLUE CROSS/BLUE SHIELD | Admitting: Orthopaedic Surgery

## 2017-11-17 ENCOUNTER — Other Ambulatory Visit (INDEPENDENT_AMBULATORY_CARE_PROVIDER_SITE_OTHER): Payer: Self-pay

## 2017-11-17 ENCOUNTER — Encounter (INDEPENDENT_AMBULATORY_CARE_PROVIDER_SITE_OTHER): Payer: Self-pay | Admitting: Orthopaedic Surgery

## 2017-11-17 DIAGNOSIS — M25562 Pain in left knee: Principal | ICD-10-CM

## 2017-11-17 DIAGNOSIS — M25561 Pain in right knee: Secondary | ICD-10-CM | POA: Diagnosis not present

## 2017-11-17 DIAGNOSIS — M1712 Unilateral primary osteoarthritis, left knee: Secondary | ICD-10-CM | POA: Diagnosis not present

## 2017-11-17 DIAGNOSIS — M1711 Unilateral primary osteoarthritis, right knee: Secondary | ICD-10-CM | POA: Diagnosis not present

## 2017-11-17 DIAGNOSIS — G8929 Other chronic pain: Secondary | ICD-10-CM

## 2017-11-17 MED ORDER — LIDOCAINE HCL 1 % IJ SOLN
3.0000 mL | INTRAMUSCULAR | Status: AC | PRN
  Administered 2017-11-17: 3 mL

## 2017-11-17 MED ORDER — METHYLPREDNISOLONE ACETATE 40 MG/ML IJ SUSP
40.0000 mg | INTRAMUSCULAR | Status: AC | PRN
  Administered 2017-11-17: 40 mg via INTRA_ARTICULAR

## 2017-11-17 NOTE — Progress Notes (Signed)
Office Visit Note   Patient: Yvette Roberts           Date of Birth: May 18, 1956           MRN: [id number] Visit Date: 11/17/2017              Requested by: [name], DO [place], [place] [address] PCP: [name], DO   Assessment & Plan: Visit Diagnoses:  1. Chronic pain of both knees   2. Unilateral primary osteoarthritis, left knee   3. Unilateral primary osteoarthritis, right knee     Plan: She understands that she would certainly benefit from quad strengthening exercises and weight loss.  We talked about trying glucosamine or Tumeric.  I do feel that she is a candidate for trying a different hyaluronic acid but we cannot do that until about April.  Offered steroid injection in her knees today and we went over her x-rays in general.  She did tolerate steroid injections well and both knees.  We will see her back in 2 months to see how she is done between the steroid injections and a combination of over-the-counter medications and physical therapy.  All questions concerns were answered and addressed.  Follow-Up Instructions: Return in about 3 months (around 02/15/2018).   Orders:  Orders Placed This Encounter  Procedures  . Large Joint Inj  . Large Joint Inj  . XR Knee 1-2 Views Left  . XR Knee 1-2 Views Right   No orders of the defined types were placed in this encounter.     Procedures: Large Joint Inj on 11/17/2017 9:55 AM Indications: diagnostic evaluation and pain Details: 22 G 1.5 in needle, superolateral approach  Arthrogram: No  Medications: 3 mL lidocaine 1 %; 40 mg methylPREDNISolone acetate 40 MG/ML Outcome: tolerated well, no immediate complications Procedure, treatment alternatives, risks and benefits explained, specific risks discussed. Consent was given by the patient. Immediately prior to procedure a time out was called to verify the correct patient, procedure, equipment, support staff and site/side marked as required. Patient was  prepped and draped in the usual sterile fashion.   Large Joint Inj: bilateral knee on 11/17/2017 9:55 AM Indications: diagnostic evaluation and pain Details: 22 G 1.5 in needle, superolateral approach  Arthrogram: No  Outcome: tolerated well, no immediate complications Procedure, treatment alternatives, risks and benefits explained, specific risks discussed. Consent was given by the patient. Immediately prior to procedure a time out was called to verify the correct patient, procedure, equipment, support staff and site/side marked as required. Patient was prepped and draped in the usual sterile fashion.       Clinical Data: No additional findings.   Subjective: Chief Complaint  Patient presents with  . Right Knee - Pain  . Left Knee - Pain  Patient comes in today with chief complaint of bilateral knee pain.  Is been going on for a very long period of time.  I think she is taking care of her mother before.  She last had hyaluronic acid injections she reports in October the knee is necessary to 3 injections he said he did not work at all.  However I provided I believe Synvisc versus Monovisc and her mother is not interested at this helped.  She is someone who is moderately obese.  She does feel that she can try some therapy on her knees.  She has not had injections in a while in terms of steroid she like to try these today  to.  Her pain is more of a constant type of pain and stiff in the morning and stiff and she is been sitting for long period time and gets up to walk.  She denies any locking catching.  She does report popping underneath the kneecaps and grinding.  HPI  Review of Systems She denies any headache, chest pain, shortness of breath, fever, chills, nausea, vomiting  Objective: Vital Signs: There were no vitals taken for this visit.  Physical Exam She is alert and oriented x3 and in no acute distress Ortho Exam Examination of both knees shows significant patellofemoral  crepitation.  There is only slight varus malalignment of both knees.  Both knees have good range of motion overall are globally tender. Specialty Comments:  No specialty comments available.  Imaging: Xr Knee 1-2 Views Left  Result Date: 11/17/2017 An AP and lateral left knee moderate arthritic changes with periarticular osteophytes in the medial compartment and significant patellofemoral arthritic changes.  Xr Knee 1-2 Views Right  Result Date: 11/17/2017 An AP and lateral of the right knee shows moderate arthritic changes with slight varus malalignment and medial joint space narrowing as well as significant patellofemoral arthritic changes.    PMFS History: Patient Active Problem List   Diagnosis Date Noted  . Unilateral primary osteoarthritis, left knee 11/17/2017  . Unilateral primary osteoarthritis, right knee 11/17/2017  . Degenerative arthritis of knee, bilateral 07/14/2017  . Baker cyst, left 06/16/2017  . Pain and swelling of left lower extremity 05/26/2017  . Degenerative joint disease of knee, left 05/26/2017  . Hyperlipemia 06/12/2016  . MDD (major depressive disorder) 06/12/2016  . Urticaria 06/12/2016  . Type 2 diabetes mellitus without complication ([facility]) 74/09/8785  . Obesity 07/21/2014  . Resistant hypertension 07/21/2014  . Esophageal reflux 07/21/2014   Past Medical History:  Diagnosis Date  . Allergy   . Arthritis   . Depression   . Esophageal reflux 07/21/2014  . Frozen shoulder 05/10/2015   Injected 05/10/2015   . Obesity, unspecified 07/21/2014  . Resistant hypertension 07/21/2014  . Type 2 diabetes mellitus without complication ([facility]) 7/67/2094    Family History  Problem Relation Age of Onset  . Breast cancer Neg Hx     Past Surgical History:  Procedure Laterality Date  . LAPAROSCOPIC ABDOMINAL EXPLORATION    . miscarr     Social History   Occupational History  . Not on file  Tobacco Use  . Smoking status: Never Smoker  . Smokeless tobacco:  Never Used  Substance and Sexual Activity  . Alcohol use: Not on file  . Drug use: Not on file  . Sexual activity: Not on file

## 2017-12-01 ENCOUNTER — Encounter: Payer: Self-pay | Admitting: Family Medicine

## 2017-12-01 ENCOUNTER — Ambulatory Visit: Payer: BLUE CROSS/BLUE SHIELD | Admitting: Family Medicine

## 2017-12-01 VITALS — BP 110/90 | HR 56 | Temp 98.0°F | Ht 66.0 in | Wt 244.2 lb

## 2017-12-01 DIAGNOSIS — F3340 Major depressive disorder, recurrent, in remission, unspecified: Secondary | ICD-10-CM

## 2017-12-01 DIAGNOSIS — E1159 Type 2 diabetes mellitus with other circulatory complications: Secondary | ICD-10-CM

## 2017-12-01 DIAGNOSIS — I10 Essential (primary) hypertension: Secondary | ICD-10-CM | POA: Diagnosis not present

## 2017-12-01 DIAGNOSIS — E119 Type 2 diabetes mellitus without complications: Secondary | ICD-10-CM | POA: Diagnosis not present

## 2017-12-01 DIAGNOSIS — E785 Hyperlipidemia, unspecified: Secondary | ICD-10-CM

## 2017-12-01 DIAGNOSIS — I152 Hypertension secondary to endocrine disorders: Secondary | ICD-10-CM

## 2017-12-01 LAB — LIPID PANEL
Cholesterol: 173 mg/dL (ref 0–200)
HDL: 75.1 mg/dL (ref >39.00)
LDL Cholesterol: 89 mg/dL (ref 0–99)
NONHDL: 98.1
Total CHOL/HDL Ratio: 2
Triglycerides: 45 mg/dL (ref 0.0–149.0)
VLDL: 9 mg/dL (ref 0.0–40.0)

## 2017-12-01 LAB — CBC
HEMATOCRIT: 40.6 % (ref 36.0–46.0)
Hemoglobin: 13.5 g/dL (ref 12.0–15.0)
MCHC: 33.2 g/dL (ref 30.0–36.0)
MCV: 84.5 fl (ref 78.0–100.0)
Platelets: 282 10*3/uL (ref 150.0–400.0)
RBC: 4.81 Mil/uL (ref 3.87–5.11)
RDW: 16.1 % — AB (ref 11.5–15.5)
WBC: 5.4 10*3/uL (ref 4.0–10.5)

## 2017-12-01 LAB — BASIC METABOLIC PANEL
BUN: 13 mg/dL (ref 6–23)
CHLORIDE: 101 meq/L (ref 96–112)
CO2: 30 mEq/L (ref 19–32)
CREATININE: 0.78 mg/dL (ref 0.40–1.20)
Calcium: 8.9 mg/dL (ref 8.4–10.5)
GFR: 96.2 mL/min (ref >60.00)
Glucose, Bld: 113 mg/dL — ABNORMAL HIGH (ref 70–99)
Potassium: 4.2 mEq/L (ref 3.5–5.1)
Sodium: 138 mEq/L (ref 135–145)

## 2017-12-01 LAB — HEMOGLOBIN A1C: HEMOGLOBIN A1C: 6.4 % (ref 4.6–6.5)

## 2017-12-01 MED ORDER — CARVEDILOL 12.5 MG PO TABS: 12.5000 mg | ORAL_TABLET | Freq: Two times a day (BID) | ORAL | 3 refills | Status: DC

## 2017-12-01 MED ORDER — LOSARTAN POTASSIUM 100 MG PO TABS: 100.0000 mg | ORAL_TABLET | Freq: Every day | ORAL | 3 refills | Status: DC

## 2017-12-01 MED ORDER — AMLODIPINE BESYLATE 10 MG PO TABS: 10.0000 mg | ORAL_TABLET | Freq: Every day | ORAL | 3 refills | Status: DC

## 2017-12-01 MED ORDER — FLUOXETINE HCL 40 MG PO CAPS: 40.0000 mg | ORAL_CAPSULE | Freq: Every day | ORAL | 3 refills | Status: DC

## 2017-12-01 NOTE — Progress Notes (Signed)
HPI:  Yvette Roberts is a pleasant 62 year old with a past medical history significant for depression, obesity, hypertension, diabetes and acid reflux.  She also suffers from arthritis of both of her knees and is seeing an orthopedic specialist now for this, Dr. Ninfa Linden. At her last visit she reported she was going to establish care with a new PCP, as our office is quite a long drive her now.  However she has not made that change and concerned about her recall on her losartan.  She has not been taking it.  She received a letter from her pharmacy, which she shows me.  The letter instructs the patient to contact the pharmacy about this so that they can send her a different product from a different manufacturer.  I relayed this information to her.  Trying to eat healthier.  Refills on her medications.  No chest pain, shortness of breath, increased swelling.  Reports she is no longer taking Lasix reports was prescribed by her specialist for her joint pains.  Reports her mood has been good, but she is taking 40 mg of the Prozac.  She wants to continue this dose.  See PHQ 9 score.  [name]: See pertinent positives and negatives per HPI.  Past Medical History:  Diagnosis Date  . Allergy   . Arthritis   . Depression   . Esophageal reflux 07/21/2014  . Frozen shoulder 05/10/2015   Injected 05/10/2015   . Obesity, unspecified 07/21/2014  . Resistant hypertension 07/21/2014  . Type 2 diabetes mellitus without complication ([place]) 1/61/0960    Past Surgical History:  Procedure Laterality Date  . LAPAROSCOPIC ABDOMINAL EXPLORATION    . miscarr      Family History  Problem Relation Age of Onset  . Breast cancer Neg Hx     Social History   Socioeconomic History  . Marital status: Divorced    Spouse name: None  . Number of children: None  . Years of education: None  . Highest education level: None  Social Needs  . Financial resource strain: None  . Food insecurity - worry: None  . Food insecurity -  inability: None  . Transportation needs - medical: None  . Transportation needs - non-medical: None  Occupational History  . None  Tobacco Use  . Smoking status: Never Smoker  . Smokeless tobacco: Never Used  Substance and Sexual Activity  . Alcohol use: None  . Drug use: None  . Sexual activity: None  Other Topics Concern  . 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  amLODipine (NORVASC) 10 MG tablet, Take 1 tablet (10 mg total) by mouth daily., Disp: 90 tablet, Rfl: 3 .  aspirin 81 MG tablet, Take 81 mg by mouth daily., Disp: , Rfl:  .  carvedilol (COREG) 12.5 MG tablet, Take 1 tablet (12.5 mg total) by mouth 2 (two) times daily with a meal., Disp: 180 tablet, Rfl: 3 .  cholecalciferol (VITAMIN D) 1000 UNITS tablet, Take 4,000 Units by mouth daily. , Disp: , Rfl:  .  Diclofenac Sodium (PENNSAID) 2 % SOLN, Place 2 application onto the skin 2 (two) times daily., Disp: 112 g, Rfl: 3 .  FLUoxetine (  PROZAC) 40 MG capsule, Take 1 capsule (40 mg total) by mouth daily., Disp: 90 capsule, Rfl: 3 .  losartan (COZAAR) 100 MG tablet, Take 1 tablet (100 mg total) by mouth daily., Disp: 90 tablet, Rfl: 3 .  MELATONIN PO, Take 1 mg by mouth at bedtime., Disp: , Rfl:  .  omeprazole (PRILOSEC) 40 MG capsule, take 1 capsule by mouth once daily, Disp: 90 capsule, Rfl: 1 .  spironolactone (ALDACTONE) 25 MG tablet, Take 12.5 mg by mouth 2 (two) times daily., Disp: , Rfl: 0 .  Vitamin D, Ergocalciferol, (DRISDOL) 50000 units CAPS capsule, Take 1 capsule (50,000 Units total) by mouth every 7 (seven) days., Disp: 12 capsule, Rfl: 0  EXAM:  Vitals:   12/01/17 0942  BP: 110/90  Pulse: (!) 56  Temp: 98 F (36.7 C)    Body mass index is 39.41 kg/m.  GENERAL: vitals reviewed and listed  above, alert, oriented, appears well hydrated and in no acute distress  HEENT: atraumatic, conjunttiva clear, no obvious abnormalities on inspection of external nose and ears  NECK: no obvious masses on inspection  LUNGS: clear to auscultation bilaterally, no wheezes, rales or rhonchi, good air movement  CV: HRRR, no peripheral edema  MS: moves all extremities without noticeable abnormality  PSYCH: pleasant and cooperative, no obvious depression or anxiety  ASSESSMENT AND PLAN:  Discussed the following assessment and plan:  Type 2 diabetes mellitus without complication, without long-term current use of insulin (HCC) - Plan: Hemoglobin A1c  Hypertension associated with diabetes ([place]) - Plan: Basic metabolic panel, CBC  Hyperlipidemia, unspecified hyperlipidemia type - Plan: Lipid panel  Morbid obesity ([place])  MDD (recurrent major depressive disorder) in remission ([place])  -Labs today per orders -Refills per her request -Advised that she contact her pharmacy about the losartan recall and let us know if she needs a new prescription -Advised a healthy low sugar diet and regular exercise -Advised of health maintenance due, she agrees to schedule on her own or declines -she reports she will schedule her colonoscopy through her gastroenterologist and that she does not need [place] to place a referral for this -Patient advised to return or notify a doctor immediately if symptoms worsen or persist or new concerns arise.  Patient Instructions  BEFORE YOU LEAVE: -labs -follow up: 3-4 months  We have ordered labs or studies at this visit. It can take up to 1-2 weeks for results and processing. IF results require follow up or explanation, we will call you with instructions. Clinically stable results will be released to your [facility]. If you have not heard from [place] or cannot find your results in [facility] in 2 weeks please contact our office at [phone].  If you are not yet signed up for [facility],  please consider signing up.   We recommend the following healthy lifestyle for LIFE: 1) Small portions. But, make sure to get regular (at least 3 per day), healthy meals and small healthy snacks if needed.  2) Eat a healthy clean diet.   TRY TO EAT: -at least 5-7 servings of low sugar, colorful, and nutrient rich vegetables per day (not corn, potatoes or bananas.) -berries are the best choice if you wish to eat fruit (only eat small amounts if trying to reduce weight)  -lean meets (fish, white meat of chicken or [place]) -vegan proteins for some meals - beans or tofu, whole grains, nuts and seeds -Replace bad fats with good fats - good fats include: fish, nuts and seeds, canola oil,  olive oil -small amounts of low fat or non fat dairy -small amounts of100 % whole grains - check the lables -drink plenty of water  AVOID: -SUGAR, sweets, anything with added sugar, corn syrup or sweeteners - must read labels as even foods advertised as "healthy" often are loaded with sugar -if you must have a sweetener, small amounts of stevia may be best -sweetened beverages and artificially sweetened beverages -simple starches (rice, bread, potatoes, pasta, chips, etc - small amounts of 100% whole grains are ok) -red meat, pork, butter -fried foods, fast food, processed food, excessive dairy, eggs and coconut.  3)Get at least 150 minutes of sweaty aerobic exercise per week.  4)Reduce stress - consider counseling, meditation and relaxation to balance other aspects of your life.          [name], DO

## 2017-12-01 NOTE — Patient Instructions (Signed)
BEFORE YOU LEAVE: -labs -follow up: 3-4 months  We have ordered labs or studies at this visit. It can take up to 1-2 weeks for results and processing. IF results require follow up or explanation, we will call you with instructions. Clinically stable results will be released to your [facility]. If you have not heard from [place] or cannot find your results in [facility] in 2 weeks please contact our office at [phone].  If you are not yet signed up for [facility], please consider signing up.   We recommend the following healthy lifestyle for LIFE: 1) Small portions. But, make sure to get regular (at least 3 per day), healthy meals and small healthy snacks if needed.  2) Eat a healthy clean diet.   TRY TO EAT: -at least 5-7 servings of low sugar, colorful, and nutrient rich vegetables per day (not corn, potatoes or bananas.) -berries are the best choice if you wish to eat fruit (only eat small amounts if trying to reduce weight)  -lean meets (fish, white meat of chicken or [place]) -vegan proteins for some meals - beans or tofu, whole grains, nuts and seeds -Replace bad fats with good fats - good fats include: fish, nuts and seeds, canola oil, olive oil -small amounts of low fat or non fat dairy -small amounts of100 % whole grains - check the lables -drink plenty of water  AVOID: -SUGAR, sweets, anything with added sugar, corn syrup or sweeteners - must read labels as even foods advertised as "healthy" often are loaded with sugar -if you must have a sweetener, small amounts of stevia may be best -sweetened beverages and artificially sweetened beverages -simple starches (rice, bread, potatoes, pasta, chips, etc - small amounts of 100% whole grains are ok) -red meat, pork, butter -fried foods, fast food, processed food, excessive dairy, eggs and coconut.  3)Get at least 150 minutes of sweaty aerobic exercise per week.  4)Reduce stress - consider counseling, meditation and relaxation to balance  other aspects of your life.

## 2017-12-10 ENCOUNTER — Other Ambulatory Visit: Payer: Self-pay

## 2017-12-10 ENCOUNTER — Encounter: Payer: Self-pay | Admitting: Physical Therapy

## 2017-12-10 ENCOUNTER — Ambulatory Visit: Payer: BLUE CROSS/BLUE SHIELD | Attending: Family Medicine | Admitting: Physical Therapy

## 2017-12-10 DIAGNOSIS — R2689 Other abnormalities of gait and mobility: Secondary | ICD-10-CM | POA: Diagnosis present

## 2017-12-10 DIAGNOSIS — M6281 Muscle weakness (generalized): Secondary | ICD-10-CM

## 2017-12-10 DIAGNOSIS — M25562 Pain in left knee: Secondary | ICD-10-CM | POA: Insufficient documentation

## 2017-12-10 DIAGNOSIS — G8929 Other chronic pain: Secondary | ICD-10-CM | POA: Insufficient documentation

## 2017-12-10 DIAGNOSIS — M25561 Pain in right knee: Secondary | ICD-10-CM | POA: Insufficient documentation

## 2017-12-10 NOTE — Therapy (Addendum)
[place], [place], [address] Phone: [phone]   Fax:  [phone]  Physical Therapy Evaluation / Discharge Note  Patient Details  Name: Yvette Roberts MRN: [id number] Date of Birth: [birth year] Referring Provider: Zollie Beckers MD   Encounter Date: 12/10/2017  PT End of Session - 12/10/17 1015    Visit Number  1    Number of Visits  8    Date for PT Re-Evaluation  02/04/18    PT Start Time  1016    PT Stop Time  1101    PT Time Calculation (min)  45 min    Activity Tolerance  Patient tolerated treatment well    Behavior During Therapy  [facility] for tasks assessed/performed       Past Medical History:  Diagnosis Date  . Allergy   . Arthritis   . Depression   . Esophageal reflux 07/21/2014  . Frozen shoulder 05/10/2015   Injected 05/10/2015   . Obesity, unspecified 07/21/2014  . Resistant hypertension 07/21/2014  . Type 2 diabetes mellitus without complication ([place]) 6/50/3546    Past Surgical History:  Procedure Laterality Date  . LAPAROSCOPIC ABDOMINAL EXPLORATION    . miscarr      There were no vitals filed for this visit.   Subjective Assessment - 12/10/17 1027    Subjective  pt is 62 y.o with CC of bil knee pain that started in August of 2018 which she stated started after getting out of a low car and had pain both. pain stays in the back of the knee. She denies any N/T and reports it is mostly pain. She reports got an injection with her MD and it helped back in January. since onset the pain is improving.     Limitations  Sitting;Standing    How long can you sit comfortably?  60 min     How long can you stand comfortably?  unlimited    How long can you walk comfortably?  unlimited    Diagnostic tests  x-ray     Patient Stated Goals  to be able to sit down and get up without issues. 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  Yes    Pain Score  0-No pain at worst 10/10    Pain  Location  Knee    Pain Orientation  Left;Right    Pain Descriptors / Indicators  Sharp;Stabbing    Pain Type  Chronic pain    Pain Onset  More than a month ago    Pain Frequency  Intermittent    Aggravating Factors   prolonged sitting, getting up from a seated position    Pain Relieving Factors  putting the feet up. ice, heat    Effect of Pain on Daily Activities  limited sitting tolerance         [facility] PT Assessment - 12/10/17 1019      Assessment   Medical Diagnosis  bil knee pain    Referring Provider  Zollie Beckers MD    Onset Date/Surgical Date  -- August 2018    Hand Dominance  Left    Next MD Visit  -- January 13, 2018    Prior Therapy  no      Precautions   Precautions  None      Restrictions   Weight Bearing Restrictions  No      Balance Screen   Has the patient fallen in the past 6  months  No    Has the patient had a decrease in activity level because of a fear of falling?   No    Is the patient reluctant to leave their home because of a fear of falling?   No      Home Environment   Living Environment  Private residence    Living Arrangements  Parent mom has stage 4 deminsia    Available Help at Discharge  Family;Available PRN/intermittently    Type of Home  Apartment    Home Access  Level entry    Home Layout  One level    [place] - 2 wheels;Shower seat;Bedside commode      Prior Function   Level of Independence  Independent    Vocation  Part time employment counselor     Vocation Requirements  prolonged sitting    Leisure  marching in the band      Cognition   Overall Cognitive Status  Within Functional Limits for tasks assessed      Observation/Other Assessments   Focus on Therapeutic Outcomes (FOTO)   45% limited predicted 37% limited      Posture/Postural Control   Posture/Postural Control  Postural limitations    Postural Limitations  Rounded Shoulders;Forward head      ROM / Strength   AROM / PROM / Strength   AROM;Strength;PROM      AROM   AROM Assessment Site  Knee    Right/Left Knee  Right;Left    Right Knee Extension  4 ERP    Right Knee Flexion  107 ERP    Left Knee Extension  0    Left Knee Flexion  110      PROM   PROM Assessment Site  Knee    Right/Left Knee  Right;Left    Right Knee Extension  0 ERP    Right Knee Flexion  112 ERP    Left Knee Extension  0    Left Knee Flexion  115      Strength   Strength Assessment Site  Hip;Knee    Right/Left Hip  Right;Left    Right Hip Flexion  4-/5 pain noted in  the back     Right Hip Extension  4-/5    Right Hip ABduction  4/5    Right Hip ADduction  4-/5    Left Hip Flexion  4/5    Left Hip Extension  4-/5    Left Hip ABduction  4/5    Left Hip ADduction  4-/5    Right/Left Knee  Right;Left    Right Knee Flexion  4/5 pain in the back of the knee    Right Knee Extension  4/5 pain during testing    Left Knee Flexion  4/5    Left Knee Extension  4/5      Palpation   Palpation comment  TTP in the R popliteal space with palpable bakers cyst noted in the lateral aspect,  L knee no specific tenderness      Ambulation/Gait   Ambulation/Gait  Yes    Gait Pattern  Step-through pattern;Decreased stride length;Antalgic lateral sway bil,             Objective measurements completed on examination: See above findings.      [place] Adult PT Treatment/Exercise - 12/10/17 1019      Knee/Hip Exercises: Stretches   Active Hamstring Stretch  4 reps;30 seconds supine x  2 and sitting x 2  Knee/Hip Exercises: Seated   Long Arc Quad  2 sets;10 reps;Right      Manual Therapy   Manual therapy comments  MTPR along bicep femoris x 3 how to perform at home with tennis ball             PT Education - 12/10/17 1104    Education provided  Yes    Education Details  evaluation findings, POC, goals, HEP with proper form/ rationale. Anatomy of the knee and bakers cysts.     Person(s) Educated  Patient    Methods   Explanation;Verbal cues;Handout    Comprehension  Verbalized understanding;Verbal cues required       PT Short Term Goals - 12/10/17 1114      PT SHORT TERM GOAL #1   Title  pt to be I with inital HEP    Time  4    Period  Weeks    Status  New    Target Date  01/07/18        PT Long Term Goals - 12/10/17 1114      PT LONG TERM GOAL #1   Title  pt to increase bil hip/ knee strength to >/= 4+/5 to promote walking/ standing activities with </= 1/10 pain     Time  8    Period  Weeks    Status  New    Target Date  02/04/18      PT LONG TERM GOAL #2   Title  pt to be able to sit for >/= 60 min with </=1/10 pain noted when stransitioning to standing position     Time  8    Period  Weeks    Status  New    Target Date  02/04/18      PT LONG TERM GOAL #3   Title  pt to be able to return to marching and band activities with </= 1/10 pain for pt personal goal.     Time  8    Period  Weeks    Status  New    Target Date  02/04/18      PT LONG TERM GOAL #4   Title  Increase FOTO score to </= 37% limited to demo improvement in function     Time  8    Period  Weeks    Status  New    Target Date  02/04/18      PT LONG TERM GOAL #5   Title  pt to be I with all HEp given as of last visit to maintain and progress current level of function    Time  8    Period  Weeks    Status  New    Target Date  02/04/18             Plan - 12/10/17 1109    Clinical Impression Statement  pt presents to OPPT with CC or bil knee pain with R>L that started getting out of a car in August of 2018. limited knee AROM bil with pain noted in end ranges with R>L. weakness in bil knees/ hips with pain during testing with RLE assessment. TTP along R popliteal space with notable bakers cyst in the lateral aspect, and tightness in the hamstrings on the R. follow session she reported decreased tightness and was able to sit<>stand with no report of pain. she would benefit from physical therapy to decrease R  knee pain, improve moblity and strength, and returh to PLOF by addressing  the deficits listed.     Clinical Presentation  Stable    Clinical Decision Making  Low    PT Frequency  1x / week possibly every other week due to co-pay    PT Duration  6 weeks    PT Treatment/Interventions  ADLs/Self Care Home Management;Cryotherapy;Iontophoresis 73m/ml Dexamethasone;Ultrasound;Electrical Stimulation;Passive range of motion;Dry needling;Therapeutic exercise;Therapeutic activities;Manual techniques;Taping;Patient/family education    PT Next Visit Plan  Review / update HEP, hamstring stretching/ manual techniqeus, hip/ knee strengthening. modalities PRN    PT Home Exercise Plan  calf stretching standing (both bent/ straight) hamstring stretching in supine/ sitting, sidelying hip abduction, LAQ, SLR    Consulted and Agree with Plan of Care  Patient       Patient will benefit from skilled therapeutic intervention in order to improve the following deficits and impairments:     Visit Diagnosis: Chronic pain of right knee  Chronic pain of left knee  Muscle weakness (generalized)  Other abnormalities of gait and mobility     Problem List Patient Active Problem List   Diagnosis Date Noted  . Unilateral primary osteoarthritis, left knee 11/17/2017  . Unilateral primary osteoarthritis, right knee 11/17/2017  . Degenerative arthritis of knee, bilateral 07/14/2017  . Baker cyst, left 06/16/2017  . Pain and swelling of left lower extremity 05/26/2017  . Degenerative joint disease of knee, left 05/26/2017  . Hyperlipemia 06/12/2016  . MDD (major depressive disorder) 06/12/2016  . Urticaria 06/12/2016  . Type 2 diabetes mellitus without complication ([place] 003/54/6568 . Obesity 07/21/2014  . Resistant hypertension 07/21/2014  . Esophageal reflux 07/21/2014   KStarr LakePT, DPT, LAT, ATC  12/10/17  11:17 AM      [facility]GLake [place] N[place] [address]Phone: [phone]  Fax:  [phone] Name: Yvette JEFCOATMRN: [id number] of Birth: [birth year]          PHYSICAL THERAPY DISCHARGE SUMMARY  Visits from Start of Care: 1  Current functional level related to goals / functional outcomes: See goals   Remaining deficits: unknown   Education / Equipment: HEP  Plan: Patient agrees to discharge.  Patient goals were not met. Patient is being discharged due to not returning since the last visit.  ?????        Kristoffer Leamon PT, DPT, LAT, ATC  01/28/18  4:04 PM

## 2017-12-25 ENCOUNTER — Ambulatory Visit: Payer: BLUE CROSS/BLUE SHIELD | Admitting: Physical Therapy

## 2018-01-08 ENCOUNTER — Ambulatory Visit: Payer: BLUE CROSS/BLUE SHIELD | Admitting: Physical Therapy

## 2018-01-19 ENCOUNTER — Encounter (INDEPENDENT_AMBULATORY_CARE_PROVIDER_SITE_OTHER): Payer: Self-pay | Admitting: Orthopaedic Surgery

## 2018-01-19 ENCOUNTER — Ambulatory Visit (INDEPENDENT_AMBULATORY_CARE_PROVIDER_SITE_OTHER): Payer: BLUE CROSS/BLUE SHIELD | Admitting: Orthopaedic Surgery

## 2018-01-19 ENCOUNTER — Other Ambulatory Visit: Payer: Self-pay | Admitting: Family Medicine

## 2018-01-19 DIAGNOSIS — M1712 Unilateral primary osteoarthritis, left knee: Secondary | ICD-10-CM | POA: Diagnosis not present

## 2018-01-19 DIAGNOSIS — M1711 Unilateral primary osteoarthritis, right knee: Secondary | ICD-10-CM

## 2018-01-19 NOTE — Progress Notes (Signed)
The patient is continue to follow-up with moderate arthritic pain in both of her knees.  She has had hyaluronic acid in her knees remotely.  She is also had steroid injections by me in both her knees just in January.  She is worked on activity modification and weight loss.  She is work on Astr[employer].  We did send her to physical therapy for a little bit but the co-pays are so expensive she had to stop doing this.  On exam her left knee hurts quite thick significantly with flexion and extension.  The right knee hurts moderately as well.  She does have patellofemoral crepitation as well.  This point we will order hyaluronic acid for both knees.  She is agreeable to try this again.  She has adverse right now replacement surgery and we want to try to stay as conservative as possible with continued weight loss and quad strengthening exercises.  We will see her back in 2 weeks and hopefully will have hyaluronic acid approved for both knees.

## 2018-01-20 ENCOUNTER — Telehealth (INDEPENDENT_AMBULATORY_CARE_PROVIDER_SITE_OTHER): Payer: Self-pay

## 2018-01-20 NOTE — Telephone Encounter (Signed)
Submitted application online for SynviscOne Inj., Bilateral Knee.

## 2018-01-30 ENCOUNTER — Telehealth (INDEPENDENT_AMBULATORY_CARE_PROVIDER_SITE_OTHER): Payer: Self-pay

## 2018-01-30 NOTE — Telephone Encounter (Signed)
Called and left VM advising patient to return call to reschedule appt.

## 2018-01-30 NOTE — Telephone Encounter (Signed)
Faxed completed authorization form to [facility] at [phone].

## 2018-01-30 NOTE — Telephone Encounter (Signed)
Patient returned my call and reschedueld appt.for 02/17/18 with Dr. Ninfa Linden.

## 2018-02-02 ENCOUNTER — Ambulatory Visit (INDEPENDENT_AMBULATORY_CARE_PROVIDER_SITE_OTHER): Payer: BLUE CROSS/BLUE SHIELD | Admitting: Orthopaedic Surgery

## 2018-02-10 ENCOUNTER — Other Ambulatory Visit: Payer: Self-pay | Admitting: Family Medicine

## 2018-02-10 ENCOUNTER — Telehealth (INDEPENDENT_AMBULATORY_CARE_PROVIDER_SITE_OTHER): Payer: Self-pay

## 2018-02-10 DIAGNOSIS — Z1231 Encounter for screening mammogram for malignant neoplasm of breast: Secondary | ICD-10-CM

## 2018-02-10 NOTE — Telephone Encounter (Signed)
Patient is approved for SynviscOne injection, bilateral knee. Reference# [id number]

## 2018-02-17 ENCOUNTER — Encounter (INDEPENDENT_AMBULATORY_CARE_PROVIDER_SITE_OTHER): Payer: Self-pay | Admitting: Orthopaedic Surgery

## 2018-02-17 ENCOUNTER — Ambulatory Visit (INDEPENDENT_AMBULATORY_CARE_PROVIDER_SITE_OTHER): Payer: BLUE CROSS/BLUE SHIELD | Admitting: Physician Assistant

## 2018-02-17 DIAGNOSIS — M1712 Unilateral primary osteoarthritis, left knee: Secondary | ICD-10-CM

## 2018-02-17 DIAGNOSIS — M1711 Unilateral primary osteoarthritis, right knee: Secondary | ICD-10-CM | POA: Diagnosis not present

## 2018-02-17 MED ORDER — LIDOCAINE HCL 1 % IJ SOLN
0.5000 mL | INTRAMUSCULAR | Status: AC | PRN
  Administered 2018-02-17: .5 mL

## 2018-02-17 MED ORDER — HYLAN G-F 20 48 MG/6ML IX SOSY
48.0000 mg | PREFILLED_SYRINGE | INTRA_ARTICULAR | Status: AC | PRN
  Administered 2018-02-17: 48 mg via INTRA_ARTICULAR

## 2018-02-17 NOTE — Progress Notes (Signed)
   Procedure Note  Patient: Yvette Roberts             Date of Birth: 1956-07-04           MRN: [id number]             Visit Date: 02/17/2018 HPI: Ms. Pasch returns today for Synvisc injection both knees.  She has moderate arthritic pain in both knees.  She states that the right knee is bothering him more than the left.  Physical exam: Bilateral knees she has good range of motion.  No effusion or abnormal warmth in either knee.   Procedures: Visit Diagnoses: Unilateral primary osteoarthritis, right knee - Plan: Large Joint Inj: bilateral knee  Unilateral primary osteoarthritis, left knee - Plan: Large Joint Inj: bilateral knee  Large Joint Inj: bilateral knee on 02/17/2018 9:55 AM Indications: pain Details: 22 G 1.5 in needle, anterolateral approach  Arthrogram: No  Medications (Right): 0.5 mL lidocaine 1 %; 48 mg Hylan 48 MG/6ML Medications (Left): 0.5 mL lidocaine 1 %; 48 mg Hylan 48 MG/6ML Outcome: tolerated well, no immediate complications Procedure, treatment alternatives, risks and benefits explained, specific risks discussed. Consent was given by the patient. Immediately prior to procedure a time out was called to verify the correct patient, procedure, equipment, support staff and site/side marked as required. Patient was prepped and draped in the usual sterile fashion.     Plan: Follow-up in the we will see what type of response she had to the injections.  Questions are encouraged and answered at length today.

## 2018-03-04 ENCOUNTER — Ambulatory Visit
Admission: RE | Admit: 2018-03-04 | Discharge: 2018-03-04 | Disposition: A | Discharge status: Home / Self Care | Payer: BLUE CROSS/BLUE SHIELD | Source: Ambulatory Visit | Attending: Family Medicine | Admitting: Family Medicine

## 2018-03-04 DIAGNOSIS — Z1231 Encounter for screening mammogram for malignant neoplasm of breast: Secondary | ICD-10-CM

## 2018-03-05 ENCOUNTER — Other Ambulatory Visit: Payer: Self-pay | Admitting: Family Medicine

## 2018-03-05 DIAGNOSIS — R928 Other abnormal and inconclusive findings on diagnostic imaging of breast: Secondary | ICD-10-CM

## 2018-03-19 ENCOUNTER — Ambulatory Visit
Admission: RE | Admit: 2018-03-19 | Discharge: 2018-03-19 | Disposition: A | Discharge status: Home / Self Care | Payer: BLUE CROSS/BLUE SHIELD | Source: Ambulatory Visit | Attending: Family Medicine | Admitting: Family Medicine

## 2018-03-19 ENCOUNTER — Ambulatory Visit: Payer: BLUE CROSS/BLUE SHIELD

## 2018-03-19 DIAGNOSIS — R928 Other abnormal and inconclusive findings on diagnostic imaging of breast: Secondary | ICD-10-CM

## 2018-03-30 NOTE — Progress Notes (Signed)
HPI:  Using dictation device. Unfortunately this device frequently misinterprets words/phrases.  Yvette Roberts is a pleasant 62 y.o. here for follow up. Chronic medical problems summarized below were reviewed for changes.  Don ok. Dealing with OA of the knees - sees orthopedic specialist. Diet not great. Doing home exercises for the knees. Denies CP, SOB, DOE, treatment intolerance or new symptoms. Due for labs, colon ca screening, pap, eye exam - scheduled.  Hypertension: -meds include norvasc, coreg, losartan, spironolactone  DM, HLD, Morbid Obesity: -meds: none -diabetes diet controlled  Depression: -meds: prozac  GERD: -meds: prilosec  OA: -sees orthopedic specialist  [name]: See pertinent positives and negatives per HPI.  Past Medical History:  Diagnosis Date  . Allergy   . Arthritis   . Depression   . Esophageal reflux 07/21/2014  . Frozen shoulder 05/10/2015   Injected 05/10/2015   . Obesity, unspecified 07/21/2014  . Resistant hypertension 07/21/2014  . Type 2 diabetes mellitus without complication ([place]) 4/97/0263    Past Surgical History:  Procedure Laterality Date  . LAPAROSCOPIC ABDOMINAL EXPLORATION    . miscarr      Family History  Problem Relation Age of Onset  . Breast cancer Sister        ? age of onset    SOCIAL HX: see hpi   Current Outpatient Medications:  .  amLODipine (NORVASC) 10 MG tablet, Take 1 tablet (10 mg total) by mouth daily., Disp: 90 tablet, Rfl: 3 .  aspirin 81 MG tablet, Take 81 mg by mouth daily., Disp: , Rfl:  .  carvedilol (COREG) 12.5 MG tablet, Take 1 tablet (12.5 mg total) by mouth 2 (two) times daily with a meal., Disp: 180 tablet, Rfl: 3 .  cholecalciferol (VITAMIN D) 1000 UNITS tablet, Take 4,000 Units by mouth daily. , Disp: , Rfl:  .  FLUoxetine (PROZAC) 40 MG capsule, Take 1 capsule (40 mg total) by mouth daily., Disp: 90 capsule, Rfl: 3 .  losartan (COZAAR) 100 MG tablet, Take 1 tablet (100 mg total) by mouth daily.,  Disp: 90 tablet, Rfl: 3 .  MELATONIN PO, Take 1 mg by mouth at bedtime., Disp: , Rfl:  .  omeprazole (PRILOSEC) 40 MG capsule, take 1 capsule by mouth once daily, Disp: 90 capsule, Rfl: 1 .  spironolactone (ALDACTONE) 25 MG tablet, Take 12.5 mg by mouth 2 (two) times daily., Disp: , Rfl: 0  EXAM:  Vitals:   03/31/18 0850  BP: 120/80  Pulse: 62  Temp: 97.8 F (36.6 C)    Body mass index is 39.88 kg/m.  GENERAL: vitals reviewed and listed above, alert, oriented, appears well hydrated and in no acute distress  HEENT: atraumatic, conjunttiva clear, no obvious abnormalities on inspection of external nose and ears  NECK: no obvious masses on inspection  LUNGS: clear to auscultation bilaterally, no wheezes, rales or rhonchi, good air movement  CV: HRRR, no peripheral edema  MS: moves all extremities without noticeable abnormality  PSYCH: pleasant and cooperative, no obvious depression or anxiety  ASSESSMENT AND PLAN:  Discussed the following assessment and plan:  Type 2 diabetes mellitus without complication, without long-term current use of insulin (HCC) - Plan: Hemoglobin A1c  Hyperlipidemia, unspecified hyperlipidemia type - Plan: Lipid panel  Morbid obesity (HCC)  Recurrent major depressive disorder, in partial remission ([place])  Essential hypertension - Plan: Basic metabolic panel, CBC  Osteoarthritis of both knees, unspecified osteoarthritis type  -advised healthy low sugar diet, exercise and wt reduction -labs today -discuss asa risks/benefits  next visit -assistant reviewed health maintenance due and she is scheduled for all -Patient advised to return or notify a doctor immediately if symptoms worsen or persist or new concerns arise.  Patient Instructions  BEFORE YOU LEAVE: -labs -follow up: 4 months  We have ordered labs or studies at this visit. It can take up to 1-2 weeks for results and processing. IF results require follow up or explanation, we will call  you with instructions. Clinically stable results will be released to your [facility]. If you have not heard from [place] or cannot find your results in [facility] in 2 weeks please contact our office at [phone].  If you are not yet signed up for [facility], please consider signing up.   We recommend the following healthy lifestyle for LIFE: 1) Small portions. But, make sure to get regular (at least 3 per day), healthy meals and small healthy snacks if needed.  2) Eat a healthy clean diet.   TRY TO EAT: -at least 5-7 servings of low sugar, colorful, and nutrient rich vegetables per day (not corn, potatoes or bananas.) -berries are the best choice if you wish to eat fruit (only eat small amounts if trying to reduce weight)  -lean meets (fish, white meat of chicken or [place]) -vegan proteins for some meals - beans or tofu, whole grains, nuts and seeds -Replace bad fats with good fats - good fats include: fish, nuts and seeds, canola oil, olive oil -small amounts of low fat or non fat dairy -small amounts of100 % whole grains - check the lables -drink plenty of water  AVOID: -SUGAR, sweets, anything with added sugar, corn syrup or sweeteners - must read labels as even foods advertised as "healthy" often are loaded with sugar -if you must have a sweetener, small amounts of stevia may be best -sweetened beverages and artificially sweetened beverages -simple starches (rice, bread, potatoes, pasta, chips, etc - small amounts of 100% whole grains are ok) -red meat, pork, butter -fried foods, fast food, processed food, excessive dairy, eggs and coconut.  3)Get at least 150 minutes of sweaty aerobic exercise per week.  4)Reduce stress - consider counseling, meditation and relaxation to balance other aspects of your life.         [name], DO

## 2018-03-31 ENCOUNTER — Ambulatory Visit: Payer: BLUE CROSS/BLUE SHIELD | Admitting: Family Medicine

## 2018-03-31 ENCOUNTER — Encounter: Payer: Self-pay | Admitting: Family Medicine

## 2018-03-31 VITALS — BP 120/80 | HR 62 | Temp 97.8°F | Ht 66.0 in | Wt 247.1 lb

## 2018-03-31 DIAGNOSIS — M170 Bilateral primary osteoarthritis of knee: Secondary | ICD-10-CM

## 2018-03-31 DIAGNOSIS — E785 Hyperlipidemia, unspecified: Secondary | ICD-10-CM

## 2018-03-31 DIAGNOSIS — F3341 Major depressive disorder, recurrent, in partial remission: Secondary | ICD-10-CM

## 2018-03-31 DIAGNOSIS — E119 Type 2 diabetes mellitus without complications: Secondary | ICD-10-CM | POA: Diagnosis not present

## 2018-03-31 DIAGNOSIS — I10 Essential (primary) hypertension: Secondary | ICD-10-CM

## 2018-03-31 LAB — CBC
HCT: 37.8 % (ref 36.0–46.0)
Hemoglobin: 12.6 g/dL (ref 12.0–15.0)
MCHC: 33.2 g/dL (ref 30.0–36.0)
MCV: 84.7 fl (ref 78.0–100.0)
Platelets: 264 10*3/uL (ref 150.0–400.0)
RBC: 4.46 Mil/uL (ref 3.87–5.11)
RDW: 15.9 % — AB (ref 11.5–15.5)
WBC: 6.4 10*3/uL (ref 4.0–10.5)

## 2018-03-31 LAB — LIPID PANEL
CHOL/HDL RATIO: 3
Cholesterol: 181 mg/dL (ref 0–200)
HDL: 55.5 mg/dL (ref >39.00)
LDL Cholesterol: 111 mg/dL — ABNORMAL HIGH (ref 0–99)
NONHDL: 125.6
TRIGLYCERIDES: 75 mg/dL (ref 0.0–149.0)
VLDL: 15 mg/dL (ref 0.0–40.0)

## 2018-03-31 LAB — BASIC METABOLIC PANEL
BUN: 11 mg/dL (ref 6–23)
CALCIUM: 9.3 mg/dL (ref 8.4–10.5)
CO2: 30 mEq/L (ref 19–32)
CREATININE: 0.71 mg/dL (ref 0.40–1.20)
Chloride: 102 mEq/L (ref 96–112)
GFR: 107.11 mL/min (ref >60.00)
GLUCOSE: 108 mg/dL — AB (ref 70–99)
Potassium: 4.2 mEq/L (ref 3.5–5.1)
Sodium: 140 mEq/L (ref 135–145)

## 2018-03-31 LAB — HEMOGLOBIN A1C: HEMOGLOBIN A1C: 6.2 % (ref 4.6–6.5)

## 2018-03-31 NOTE — Patient Instructions (Addendum)
BEFORE YOU LEAVE: -labs -follow up: 4 months  Recommend a healthy low sugar diet and a 20 lb weight reduction in the next 3-6 months.  We have ordered labs or studies at this visit. It can take up to 1-2 weeks for results and processing. IF results require follow up or explanation, we will call you with instructions. Clinically stable results will be released to your [facility]. If you have not heard from [place] or cannot find your results in [facility] in 2 weeks please contact our office at [phone].  If you are not yet signed up for [facility], please consider signing up.   We recommend the following healthy lifestyle for LIFE: 1) Small portions. But, make sure to get regular (at least 3 per day), healthy meals and small healthy snacks if needed.  2) Eat a healthy clean diet.   TRY TO EAT: -at least 5-7 servings of low sugar, colorful, and nutrient rich vegetables per day (not corn, potatoes or bananas.) -berries are the best choice if you wish to eat fruit (only eat small amounts if trying to reduce weight)  -lean meets (fish, white meat of chicken or [place]) -vegan proteins for some meals - beans or tofu, whole grains, nuts and seeds -Replace bad fats with good fats - good fats include: fish, nuts and seeds, canola oil, olive oil -small amounts of low fat or non fat dairy -small amounts of100 % whole grains - check the lables -drink plenty of water  AVOID: -SUGAR, sweets, anything with added sugar, corn syrup or sweeteners - must read labels as even foods advertised as "healthy" often are loaded with sugar -if you must have a sweetener, small amounts of stevia may be best -sweetened beverages and artificially sweetened beverages -simple starches (rice, bread, potatoes, pasta, chips, etc - small amounts of 100% whole grains are ok) -red meat, pork, butter -fried foods, fast food, processed food, excessive dairy, eggs and coconut.  3)Get at least 150 minutes of sweaty aerobic exercise per  week.  4)Reduce stress - consider counseling, meditation and relaxation to balance other aspects of your life.

## 2018-04-12 ENCOUNTER — Other Ambulatory Visit: Payer: Self-pay | Admitting: Family Medicine

## 2018-04-20 ENCOUNTER — Encounter (INDEPENDENT_AMBULATORY_CARE_PROVIDER_SITE_OTHER): Payer: Self-pay | Admitting: Orthopaedic Surgery

## 2018-04-20 ENCOUNTER — Ambulatory Visit (INDEPENDENT_AMBULATORY_CARE_PROVIDER_SITE_OTHER): Payer: BLUE CROSS/BLUE SHIELD | Admitting: Orthopaedic Surgery

## 2018-04-20 DIAGNOSIS — M1711 Unilateral primary osteoarthritis, right knee: Secondary | ICD-10-CM

## 2018-04-20 DIAGNOSIS — M1712 Unilateral primary osteoarthritis, left knee: Secondary | ICD-10-CM | POA: Diagnosis not present

## 2018-04-20 NOTE — Progress Notes (Signed)
The patient is here for follow-up after having hyaluronic acid injections in both knees almost 2 months ago.  She is doing well overall.  She said she is not 100% better but that is helped take the edge off of things.  She is still working on quad strengthening exercise and weight loss with  On exam both knees have good range of motion overall with only mild pain.  Both knees are ligamentously stable.  A long thorough discussion about injections in her knees.  We can always provide a steroid injection sometime in late August or September if needed and then repeat hyaluronic acid injections if needed at least 6 months out.  All questions concerns were answered and addressed.  Follow-up will be as needed.

## 2018-04-21 ENCOUNTER — Ambulatory Visit (INDEPENDENT_AMBULATORY_CARE_PROVIDER_SITE_OTHER): Payer: BLUE CROSS/BLUE SHIELD | Admitting: Orthopaedic Surgery

## 2018-06-10 ENCOUNTER — Other Ambulatory Visit: Payer: Self-pay | Admitting: Family Medicine

## 2018-07-04 ENCOUNTER — Other Ambulatory Visit: Payer: Self-pay | Admitting: Family Medicine

## 2018-07-06 NOTE — Telephone Encounter (Signed)
Refill denied. Pt needs an appt. 

## 2018-07-30 ENCOUNTER — Ambulatory Visit: Payer: BLUE CROSS/BLUE SHIELD | Admitting: Family Medicine

## 2018-08-11 ENCOUNTER — Ambulatory Visit (INDEPENDENT_AMBULATORY_CARE_PROVIDER_SITE_OTHER): Payer: BLUE CROSS/BLUE SHIELD | Admitting: Orthopaedic Surgery

## 2018-12-08 ENCOUNTER — Other Ambulatory Visit: Payer: Self-pay | Admitting: *Deleted

## 2018-12-08 MED ORDER — FLUOXETINE HCL 40 MG PO CAPS: 40.0000 mg | ORAL_CAPSULE | Freq: Every day | ORAL | 0 refills | Status: DC

## 2018-12-08 NOTE — Telephone Encounter (Signed)
Rx done. 

## 2018-12-09 NOTE — Progress Notes (Signed)
HPI:  Using dictation device. Unfortunately this device frequently misinterprets words/phrases.  Yvette Roberts is a pleasant 63 y.o. here for follow up. Chronic medical problems summarized below were reviewed for changes and stability and were updated as needed below. Unfortunately, she has not yet done the colon cancer screening or pap we advised, has not followed up as advised so is late on her labs. Denies CP, SOB, DOE, treatment intolerance or new symptoms. Requests refills. Reports has a rough year as mother was placed in SNF, father of son passed and she had to return to working full time. Stressed, but does not want to change treatment currently. Agrees to consider CBT. Due for colon cancer screening - prefers colonosocpy, eye exam - reports done, pap - prefers to schedule with gyn, labs due - fasting, foot exam Refused flu shot See Dr. Ruthann Cancer at [facility] for womens exams, paps, mammograms  Hypertension: -meds include norvasc, coreg, losartan, spironolactone  DM, HLD, Morbid Obesity: -meds: none, delcined -diabetes diet controlled -reports diet ok, eating less  Depression: -meds: prozac -see phq9 -no SI  GERD: -meds: prilosec  OA: -sees orthopedic specialist  [name]: See pertinent positives and negatives per HPI.  Past Medical History:  Diagnosis Date  . Allergy   . Arthritis   . Depression   . Esophageal reflux 07/21/2014  . Frozen shoulder 05/10/2015   Injected 05/10/2015   . Obesity, unspecified 07/21/2014  . Resistant hypertension 07/21/2014  . Type 2 diabetes mellitus without complication ([place]) 1/61/0960    Past Surgical History:  Procedure Laterality Date  . LAPAROSCOPIC ABDOMINAL EXPLORATION    . miscarr      Family History  Problem Relation Age of Onset  . Breast cancer Sister        ? age of onset    SOCIAL HX: see hpi   Current Outpatient Medications:  .  amLODipine (NORVASC) 10 MG tablet, Take 1 tablet (10 mg total) by mouth  daily., Disp: 90 tablet, Rfl: 1 .  aspirin 81 MG tablet, Take 81 mg by mouth daily., Disp: , Rfl:  .  carvedilol (COREG) 12.5 MG tablet, Take 1 tablet (12.5 mg total) by mouth 2 (two) times daily with a meal., Disp: 180 tablet, Rfl: 1 .  cholecalciferol (VITAMIN D) 1000 UNITS tablet, Take 4,000 Units by mouth daily. , Disp: , Rfl:  .  FLUoxetine (PROZAC) 40 MG capsule, Take 1 capsule (40 mg total) by mouth daily., Disp: 90 capsule, Rfl: 1 .  losartan (COZAAR) 100 MG tablet, Take 1 tablet (100 mg total) by mouth daily., Disp: 90 tablet, Rfl: 1 .  MELATONIN PO, Take 1 mg by mouth at bedtime., Disp: , Rfl:  .  omeprazole (PRILOSEC) 40 MG capsule, Take 1 capsule (40 mg total) by mouth daily., Disp: 90 capsule, Rfl: 1 .  spironolactone (ALDACTONE) 25 MG tablet, Take 12.5 mg by mouth 2 (two) times daily., Disp: , Rfl: 0 .  Vitamin D, Ergocalciferol, (DRISDOL) 50000 units CAPS capsule, TAKE 1 CAPSULE BY MOUTH EVERY 7 DAYS, Disp: 12 capsule, Rfl: 0  EXAM:  Vitals:   12/10/18 0803  BP: 108/72  Pulse: 60  Temp: 98.6 F (37 C)    Body mass index is 38.3 kg/m.  GENERAL: vitals reviewed and listed above, alert, oriented, appears well hydrated and in no acute distress  HEENT: atraumatic, conjunttiva clear, no obvious abnormalities on inspection of external nose and ears  NECK: no obvious masses on inspection  LUNGS: clear to auscultation bilaterally,  no wheezes, rales or rhonchi, good air movement  CV: HRRR, no peripheral edema  MS: moves all extremities without noticeable abnormality  PSYCH: pleasant and cooperative, no obvious depression or anxiety  ASSESSMENT AND PLAN:  Discussed the following assessment and plan:  Type 2 diabetes mellitus without complication, without long-term current use of insulin (HCC) - Plan: Hemoglobin A1c  Hyperlipidemia, unspecified hyperlipidemia type - Plan: Lipid panel  Morbid obesity ([place])  Hypertension associated with diabetes ([place]) - Plan: Basic  metabolic panel, CBC  Recurrent major depressive disorder, in partial remission ([place])  Encounter for screening for HIV - Plan: HIV Antibody (routine testing w rflx)  Colon cancer screening - Plan: Ambulatory referral to Gastroenterology  -labs per orders -refills provided -lifestyle recs -offered help for stress, insomnia, depression - she wants to try sm dose melatonin, continue the SSRI, consider CBT - number provided if she wishes to schedule -advised colon cancer screening asap and risks of not including advanced term colon cancer -advised gyn annual, she prefers to schedule -follow up 3-4 weeks, sooner as needed  Patient Instructions  BEFORE YOU LEAVE: -refill meds 90 days with 1 refill -order colonoscopy for colon cancer screening - set reminder to check on progress in 1 month -labs -follow up: 3-4 months  Schedule your gynecology exam.  We have ordered labs or studies at this visit. It can take up to 1-2 weeks for results and processing. IF results require follow up or explanation, we will call you with instructions. Clinically stable results will be released to your [facility]. If you have not heard from [place] or cannot find your results in [facility] in 2 weeks please contact our office at [phone].  If you are not yet signed up for [facility], please consider signing up.    We recommend the following healthy lifestyle for LIFE: 1) Small portions. But, make sure to get regular (at least 3 per day), healthy meals and small healthy snacks if needed.  2) Eat a healthy clean diet.   TRY TO EAT: -at least 5-7 servings of low sugar, colorful, and nutrient rich vegetables per day (not corn, potatoes or bananas.) -berries are the best choice if you wish to eat fruit (only eat small amounts if trying to reduce weight)  -lean meets (fish, white meat of chicken or [place]) -vegan proteins for some meals - beans or tofu, whole grains, nuts and seeds -Replace bad fats with good fats - good  fats include: fish, nuts and seeds, canola oil, olive oil -small amounts of low fat or non fat dairy -small amounts of100 % whole grains - check the lables -drink plenty of water  AVOID: -SUGAR, sweets, anything with added sugar, corn syrup or sweeteners - must read labels as even foods advertised as "healthy" often are loaded with sugar -if you must have a sweetener, small amounts of stevia may be best -sweetened beverages and artificially sweetened beverages -simple starches (rice, bread, potatoes, pasta, chips, etc - small amounts of 100% whole grains are ok) -red meat, pork, butter -fried foods, fast food, processed food, excessive dairy, eggs and coconut.  3)Get at least 150 minutes of sweaty aerobic exercise per week.  4)Reduce stress - consider counseling, meditation and relaxation to balance other aspects of your life.        [name], DO

## 2018-12-10 ENCOUNTER — Encounter: Payer: Self-pay | Admitting: Family Medicine

## 2018-12-10 ENCOUNTER — Ambulatory Visit (INDEPENDENT_AMBULATORY_CARE_PROVIDER_SITE_OTHER): Payer: BLUE CROSS/BLUE SHIELD | Admitting: Family Medicine

## 2018-12-10 VITALS — BP 108/72 | HR 60 | Temp 98.6°F | Ht 66.0 in | Wt 237.3 lb

## 2018-12-10 DIAGNOSIS — Z1211 Encounter for screening for malignant neoplasm of colon: Secondary | ICD-10-CM

## 2018-12-10 DIAGNOSIS — I10 Essential (primary) hypertension: Secondary | ICD-10-CM | POA: Diagnosis not present

## 2018-12-10 DIAGNOSIS — E1159 Type 2 diabetes mellitus with other circulatory complications: Secondary | ICD-10-CM

## 2018-12-10 DIAGNOSIS — F3341 Major depressive disorder, recurrent, in partial remission: Secondary | ICD-10-CM

## 2018-12-10 DIAGNOSIS — Z114 Encounter for screening for human immunodeficiency virus [HIV]: Secondary | ICD-10-CM

## 2018-12-10 DIAGNOSIS — E785 Hyperlipidemia, unspecified: Secondary | ICD-10-CM

## 2018-12-10 DIAGNOSIS — E119 Type 2 diabetes mellitus without complications: Secondary | ICD-10-CM

## 2018-12-10 DIAGNOSIS — I152 Hypertension secondary to endocrine disorders: Secondary | ICD-10-CM

## 2018-12-10 LAB — HEMOGLOBIN A1C: Hgb A1c MFr Bld: 6.2 % (ref 4.6–6.5)

## 2018-12-10 LAB — CBC
HEMATOCRIT: 38.8 % (ref 36.0–46.0)
HEMOGLOBIN: 12.9 g/dL (ref 12.0–15.0)
MCHC: 33.2 g/dL (ref 30.0–36.0)
MCV: 83.5 fl (ref 78.0–100.0)
PLATELETS: 226 10*3/uL (ref 150.0–400.0)
RBC: 4.65 Mil/uL (ref 3.87–5.11)
RDW: 15.8 % — AB (ref 11.5–15.5)
WBC: 6.6 10*3/uL (ref 4.0–10.5)

## 2018-12-10 LAB — LIPID PANEL
Cholesterol: 168 mg/dL (ref 0–200)
HDL: 56.6 mg/dL (ref >39.00)
LDL CALC: 98 mg/dL (ref 0–99)
NONHDL: 111.2
Total CHOL/HDL Ratio: 3
Triglycerides: 64 mg/dL (ref 0.0–149.0)
VLDL: 12.8 mg/dL (ref 0.0–40.0)

## 2018-12-10 LAB — BASIC METABOLIC PANEL
BUN: 16 mg/dL (ref 6–23)
CO2: 28 meq/L (ref 19–32)
Calcium: 9.2 mg/dL (ref 8.4–10.5)
Chloride: 101 mEq/L (ref 96–112)
Creatinine, Ser: 0.92 mg/dL (ref 0.40–1.20)
GFR: 74.57 mL/min (ref >60.00)
GLUCOSE: 117 mg/dL — AB (ref 70–99)
POTASSIUM: 4.2 meq/L (ref 3.5–5.1)
SODIUM: 137 meq/L (ref 135–145)

## 2018-12-10 MED ORDER — FLUOXETINE HCL 40 MG PO CAPS: 40.0000 mg | ORAL_CAPSULE | Freq: Every day | ORAL | 1 refills | Status: DC

## 2018-12-10 MED ORDER — CARVEDILOL 12.5 MG PO TABS: 12.5000 mg | ORAL_TABLET | Freq: Two times a day (BID) | ORAL | 1 refills | Status: DC

## 2018-12-10 MED ORDER — OMEPRAZOLE 40 MG PO CPDR: 40.0000 mg | DELAYED_RELEASE_CAPSULE | Freq: Every day | ORAL | 1 refills | Status: DC

## 2018-12-10 MED ORDER — LOSARTAN POTASSIUM 100 MG PO TABS: 100.0000 mg | ORAL_TABLET | Freq: Every day | ORAL | 1 refills | Status: DC

## 2018-12-10 MED ORDER — AMLODIPINE BESYLATE 10 MG PO TABS: 10.0000 mg | ORAL_TABLET | Freq: Every day | ORAL | 1 refills | Status: DC

## 2018-12-10 NOTE — Patient Instructions (Signed)
BEFORE YOU LEAVE: -refill meds 90 days with 1 refill -order colonoscopy for colon cancer screening - set reminder to check on progress in 1 month -labs -follow up: 3-4 months  Schedule your gynecology exam.  We have ordered labs or studies at this visit. It can take up to 1-2 weeks for results and processing. IF results require follow up or explanation, we will call you with instructions. Clinically stable results will be released to your [facility]. If you have not heard from [place] or cannot find your results in [facility] in 2 weeks please contact our office at [phone].  If you are not yet signed up for [facility], please consider signing up.    We recommend the following healthy lifestyle for LIFE: 1) Small portions. But, make sure to get regular (at least 3 per day), healthy meals and small healthy snacks if needed.  2) Eat a healthy clean diet.   TRY TO EAT: -at least 5-7 servings of low sugar, colorful, and nutrient rich vegetables per day (not corn, potatoes or bananas.) -berries are the best choice if you wish to eat fruit (only eat small amounts if trying to reduce weight)  -lean meets (fish, white meat of chicken or [place]) -vegan proteins for some meals - beans or tofu, whole grains, nuts and seeds -Replace bad fats with good fats - good fats include: fish, nuts and seeds, canola oil, olive oil -small amounts of low fat or non fat dairy -small amounts of100 % whole grains - check the lables -drink plenty of water  AVOID: -SUGAR, sweets, anything with added sugar, corn syrup or sweeteners - must read labels as even foods advertised as "healthy" often are loaded with sugar -if you must have a sweetener, small amounts of stevia may be best -sweetened beverages and artificially sweetened beverages -simple starches (rice, bread, potatoes, pasta, chips, etc - small amounts of 100% whole grains are ok) -red meat, pork, butter -fried foods, fast food, processed food, excessive dairy,  eggs and coconut.  3)Get at least 150 minutes of sweaty aerobic exercise per week.  4)Reduce stress - consider counseling, meditation and relaxation to balance other aspects of your life.

## 2018-12-11 LAB — HIV ANTIBODY (ROUTINE TESTING W REFLEX): HIV 1&2 Ab, 4th Generation: NONREACTIVE

## 2018-12-15 ENCOUNTER — Other Ambulatory Visit: Payer: Self-pay | Admitting: Family Medicine

## 2019-01-08 ENCOUNTER — Other Ambulatory Visit: Payer: Self-pay | Admitting: *Deleted

## 2019-01-08 MED ORDER — FLUOXETINE HCL 40 MG PO CAPS: 40.0000 mg | ORAL_CAPSULE | Freq: Every day | ORAL | 5 refills | Status: DC

## 2019-01-08 NOTE — Telephone Encounter (Signed)
Rx done. 

## 2019-02-10 ENCOUNTER — Telehealth (INDEPENDENT_AMBULATORY_CARE_PROVIDER_SITE_OTHER): Payer: Self-pay

## 2019-02-10 NOTE — Telephone Encounter (Signed)
Per fax from [place], patient's authorization for SynviscOne, bilateral knee has been extended until 07/29/2019. [place] Approval# [id number] Valid 01/30/2018- 07/29/2019

## 2019-02-15 ENCOUNTER — Other Ambulatory Visit: Payer: Self-pay | Admitting: *Deleted

## 2019-02-15 MED ORDER — OMEPRAZOLE 40 MG PO CPDR: 40.0000 mg | DELAYED_RELEASE_CAPSULE | Freq: Every day | ORAL | 1 refills | Status: DC

## 2019-02-15 NOTE — Telephone Encounter (Signed)
Rx done. 

## 2019-03-08 NOTE — Progress Notes (Signed)
[name] [address] [place], [place] [address] Phone: [phone] Subjective:   Fontaine No, am serving as a scribe for Dr. Hulan Saas.  I'm seeing this patient by the request  of:    CC: shoulder pain   [id number]  Yvette Roberts is a 63 y.o. female coming in with complaint of right shoulder pain for 2 weeks.Has had pain in her right shoulder for the past 2 weeks. Pain radiates from the middle of her neck down into her right hand. Has been using a neck brace and sleeps up against the wall to sleep. Is using Tylenol Arthritis and Voltaren gel for her pain.      Past Medical History:  Diagnosis Date  . Allergy   . Arthritis   . Depression   . Esophageal reflux 07/21/2014  . Frozen shoulder 05/10/2015   Injected 05/10/2015   . Obesity, unspecified 07/21/2014  . Resistant hypertension 07/21/2014  . Type 2 diabetes mellitus without complication ([facility]) 2/95/2841   Past Surgical History:  Procedure Laterality Date  . LAPAROSCOPIC ABDOMINAL EXPLORATION    . miscarr     Social History   Socioeconomic History  . Marital status: Divorced    Spouse name: Not on file  . Number of children: Not on file  . Years of education: Not on file  . Highest education level: Not on file  Occupational History  . Not on file  Social Needs  . Financial resource strain: Not on file  . Food insecurity:    Worry: Not on file    Inability: Not on file  . Transportation needs:    Medical: Not on file    Non-medical: Not on file  Tobacco Use  . Smoking status: Never Smoker  . Smokeless tobacco: Never Used  Substance and Sexual Activity  . Alcohol use: Not on file  . Drug use: Not on file  . Sexual activity: Not on file  Lifestyle  . Physical activity:    Days per week: Not on file    Minutes per session: Not on file  . Stress: Not on file  Relationships  . Social connections:    Talks on phone: Not on file    Gets together: Not on file   Attends religious service: Not on file    Active member of club or organization: Not on file    Attends meetings of clubs or organizations: Not on file    Relationship status: Not on file  Other Topics Concern  . 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 Eggs Or Egg-Derived Products Hives  . Influenza Vaccines Hives  . Peanut-Containing Drug Products Hives  . Penicillins Hives  . Sulfa Antibiotics Hives   Family History  Problem Relation Age of Onset  . Breast cancer Sister        ? age of onset     Current Outpatient Medications (Cardiovascular):  .  amLODipine (NORVASC) 10 MG tablet, Take 1 tablet (10 mg total) by mouth daily. .  carvedilol (COREG) 12.5 MG tablet, Take 1 tablet (12.5 mg total) by mouth 2 (two) times daily with a meal. .  losartan (COZAAR) 100 MG tablet, TAKE 1 TABLET BY MOUTH ONCE DAILY .  spironolactone (ALDACTONE) 25 MG tablet, Take 12.5 mg by mouth 2 (two) times daily.   Current Outpatient Medications (Analgesics):  .  aspirin 81 MG tablet, Take 81 mg by mouth daily.   Current Outpatient Medications (Other):  .  cholecalciferol (VITAMIN D) 1000 UNITS tablet, Take 4,000 Units by mouth daily.  Marland Kitchen  FLUoxetine (PROZAC) 40 MG capsule, Take 1 capsule (40 mg total) by mouth daily. Marland Kitchen  MELATONIN PO, Take 1 mg by mouth at bedtime. Marland Kitchen  omeprazole (PRILOSEC) 40 MG capsule, Take 1 capsule (40 mg total) by mouth daily. .  Vitamin D, Ergocalciferol, (DRISDOL) 50000 units CAPS capsule, TAKE 1 CAPSULE BY MOUTH EVERY 7 DAYS    Past medical history, social, surgical and family history all reviewed in electronic medical record.  No pertanent information unless stated regarding to the chief complaint.   Review of Systems:  No headache, visual changes,  nausea, vomiting, diarrhea, constipation, dizziness, abdominal pain, skin rash, fevers, chills, night sweats, weight loss, swollen lymph nodes, body aches, joint swelling, positive muscle aches, chest pain, shortness of breath, mood changes.   Objective  Blood pressure 108/64, pulse (!) 58, height 5\' 6"  (1.676 m), weight 237 lb (107.5 kg), SpO2 98 %.    General: No apparent distress alert and oriented x3 mood and affect normal, dressed appropriately.  HEENT: Pupils equal, extraocular movements intact  Respiratory: Patient's speak in full sentences and does not appear short of breath  Cardiovascular: No lower extremity edema, non tender, no erythema  Skin: Warm dry intact with no signs of infection or rash on extremities or on axial skeleton.  Abdomen: Soft nontender  Neuro: Cranial nerves II through XII are intact, neurovascularly intact in all extremities with 2+ DTRs and 2+ pulses.  Lymph: No lymphadenopathy of posterior or anterior cervical chain or axillae bilaterally.  Gait normal with good balance and coordination.  MSK:  Non tender with full range of motion and good stability and symmetric strength and tone of  elbows, wrist, hip, knee and ankles bilaterally.  Shoulder: Right Inspection reveals no abnormalities, atrophy or asymmetry. Palpation i diffusely tender anteriorly palpation.  Multiple trigger points scapular region. ROM is full in all planes.  Patient does have significant voluntary guarding Rotator cuff strength normal throughout. No signs of impingement with negative Neer and Hawkin's tests, empty can sign. Speeds and Yergason's tests normal. No labral pathology noted with negative Obrien's, negative clunk and good stability. Mild scapular instability noted No painful arc and no drop arm sign. No apprehension sign   Neck exam shows loss of lordosis.  No significant trigger points noted in the right trapezius area.  Patient does have positive Spurling's range of motion  with voluntary guarding of the neck noted as well.  Patient does have some weakness in the C8 distribution..  After verbal consent patient was prepped with alcohol swabs and with a 25-gauge half inch needle was injected in 3 distinct trigger points in the right trapezius area.  Total of 3 cc of 0.5% Marcaine and 1 cc of Kenalog 40 mg/mL.  Minimal blood loss.  Band-Aids placed.  Postinjection instructions given    Impression and Recommendations:     This case required medical decision making of moderate complexity. The above documentation has been reviewed and is accurate and complete Lyndal Pulley, DO       Note: This dictation was prepared with Dragon dictation along with  smaller phrase technology. Any transcriptional errors that result from this process are unintentional.

## 2019-03-09 ENCOUNTER — Ambulatory Visit (INDEPENDENT_AMBULATORY_CARE_PROVIDER_SITE_OTHER)
Admission: RE | Admit: 2019-03-09 | Discharge: 2019-03-09 | Disposition: A | Discharge status: Home / Self Care | Payer: BLUE CROSS/BLUE SHIELD | Source: Ambulatory Visit | Attending: Family Medicine | Admitting: Family Medicine

## 2019-03-09 ENCOUNTER — Other Ambulatory Visit: Payer: Self-pay

## 2019-03-09 ENCOUNTER — Ambulatory Visit: Payer: Self-pay

## 2019-03-09 ENCOUNTER — Encounter: Payer: Self-pay | Admitting: Family Medicine

## 2019-03-09 ENCOUNTER — Ambulatory Visit: Payer: BLUE CROSS/BLUE SHIELD | Admitting: Family Medicine

## 2019-03-09 VITALS — BP 108/64 | HR 58 | Ht 66.0 in | Wt 237.0 lb

## 2019-03-09 DIAGNOSIS — M25511 Pain in right shoulder: Secondary | ICD-10-CM

## 2019-03-09 DIAGNOSIS — M5412 Radiculopathy, cervical region: Secondary | ICD-10-CM

## 2019-03-09 MED ORDER — GABAPENTIN 100 MG PO CAPS: 200.0000 mg | ORAL_CAPSULE | Freq: Every day | ORAL | 3 refills | Status: DC

## 2019-03-09 NOTE — Patient Instructions (Addendum)
Good to see you  Ice 20 minutes 2 times daily. Usually after activity and before bed. Exercises 3 times a week.  Keep hands within peripheral vision  pennsaid pinkie amount topically 2 times daily as needed.  Xrays downstairs See me again in 3-4 weeks

## 2019-03-09 NOTE — Assessment & Plan Note (Signed)
Trigger point injections given today.  Do believe there is some cervical radiculopathy plan.  Gabapentin started.  X-rays ordered.  Discussed icing regimen and home exercises.  Follow-up again in 4 to 6 weeks if no improvement or worsening weakness advanced imaging will be warranted

## 2019-03-09 NOTE — Assessment & Plan Note (Signed)
Cervical radiculopathy.  Gabapentin given.  Discussed which activities underwent casting avoid..  Follow-up again in 4 to 6 weeks

## 2019-03-30 ENCOUNTER — Encounter: Payer: Self-pay | Admitting: Family Medicine

## 2019-03-30 ENCOUNTER — Ambulatory Visit (INDEPENDENT_AMBULATORY_CARE_PROVIDER_SITE_OTHER): Payer: BLUE CROSS/BLUE SHIELD | Admitting: Family Medicine

## 2019-03-30 DIAGNOSIS — M25511 Pain in right shoulder: Secondary | ICD-10-CM | POA: Diagnosis not present

## 2019-03-30 DIAGNOSIS — M5412 Radiculopathy, cervical region: Secondary | ICD-10-CM

## 2019-03-30 NOTE — Progress Notes (Signed)
Virtual Visit via Video Note  I connected with Yvette Roberts on 03/30/19 at 10:45 AM EDT by a video enabled telemedicine application and verified that I am speaking with the correct person using two identifiers.  Location: Patient: Patient and home setting Provider: I am in office setting   I discussed the limitations of evaluation and management by telemedicine and the availability of in person appointments. The patient expressed understanding and agreed to proceed.  History of Present Illness: Patient was having more of a cervical radiculopathy but significant pain around the right shoulder.  Given trigger point injections and been doing exercises.  Feels 100% better at this point.  Not taking any medications on a regular basis but is doing the exercises regularly.   Observations/Objective: Alert and oriented x3  Assessment and Plan: Patient's potential cervical radiculopathy significantly improved at this time.  Responded well to trigger point injections.  Follow-up as needed  Follow Up Instructions: PRN    I discussed the assessment and treatment plan with the patient. The patient was provided an opportunity to ask questions and all were answered. The patient agreed with the plan and demonstrated an understanding of the instructions.   The patient was advised to call back or seek an in-person evaluation if the symptoms worsen or if the condition fails to improve as anticipated.  I provided 7 minutes of face-to-face time during this encounter.   Lyndal Pulley, DO

## 2019-04-13 ENCOUNTER — Ambulatory Visit (INDEPENDENT_AMBULATORY_CARE_PROVIDER_SITE_OTHER): Payer: BLUE CROSS/BLUE SHIELD | Admitting: Family Medicine

## 2019-04-13 ENCOUNTER — Telehealth: Payer: Self-pay | Admitting: *Deleted

## 2019-04-13 ENCOUNTER — Ambulatory Visit: Payer: BLUE CROSS/BLUE SHIELD | Admitting: Family Medicine

## 2019-04-13 ENCOUNTER — Other Ambulatory Visit: Payer: Self-pay

## 2019-04-13 ENCOUNTER — Encounter: Payer: Self-pay | Admitting: Family Medicine

## 2019-04-13 VITALS — BP 113/62 | HR 54 | Wt 237.0 lb

## 2019-04-13 DIAGNOSIS — Z1211 Encounter for screening for malignant neoplasm of colon: Secondary | ICD-10-CM

## 2019-04-13 DIAGNOSIS — E119 Type 2 diabetes mellitus without complications: Secondary | ICD-10-CM | POA: Diagnosis not present

## 2019-04-13 DIAGNOSIS — F325 Major depressive disorder, single episode, in full remission: Secondary | ICD-10-CM

## 2019-04-13 DIAGNOSIS — I10 Essential (primary) hypertension: Secondary | ICD-10-CM | POA: Diagnosis not present

## 2019-04-13 DIAGNOSIS — I152 Hypertension secondary to endocrine disorders: Secondary | ICD-10-CM

## 2019-04-13 DIAGNOSIS — E785 Hyperlipidemia, unspecified: Secondary | ICD-10-CM

## 2019-04-13 DIAGNOSIS — E1159 Type 2 diabetes mellitus with other circulatory complications: Secondary | ICD-10-CM | POA: Diagnosis not present

## 2019-04-13 NOTE — Progress Notes (Signed)
Virtual Visit via Telephone Note  I connected with Yvette Roberts on 04/13/19 at 10:00 AM EDT by telephone and verified that I am speaking with the correct person using two identifiers.  Location patient: home Location provider: work or home office Participants present for the call: patient, provider Patient did not have a visit in the prior 7 days to address this/these issue(s).   History of Present Illness:  Yvette Roberts is a pleasant 63 y.o. here for follow up. Chronic medical problems summarized below were reviewed for changes and stability and were updated as needed below. These issues and their treatment remain stable for the most part. She has had a lot of stress. Her mother is a SNF that had an outbreak of COVID. She is not able to see her mom at all. Mood ok despite this stress. Walks a lot. She works outside the home,  but feels well protected from the virus. Denies CP, SOB, DOE, wounds, ulcers, treatment intolerance or new symptoms.   Hypertension: -meds include norvasc, coreg, losartan, spironolactone  DM, HLD,MorbidObesity: -meds: none, delcined -diabetes diet controlled -reports diet ok, eating less  Depression: -meds: prozac -see phq9 -no SI  GERD: -meds: prilosec  OA: -sees orthopedic specialist   Observations/Objective: Patient sounds cheerful and well on the phone. I do not appreciate any SOB. Speech and thought processing are grossly intact. Patient reported vitals:113/69 T 98.2 P54 Wt 237  Assessment and Plan:  Type 2 diabetes mellitus without complication, without long-term current use of insulin ([facility]) - Plan: Hemoglobin A1c, lifestyle recs, eye exam advised  Hyperlipidemia, unspecified hyperlipidemia type - lifestyle recs, continue current treatment  Major depressive disorder with single episode, in full remission ([facility]) - continue current treatment  Hypertension associated with diabetes ([place]) - Plan: Basic metabolic panel, CBC, well  controlled  Morbid obesity -lifestyle recs  Follow Up Instructions:  I did not refer this patient for an OV in the next 24 hours for this/these issue(s).  I discussed the assessment and treatment plan with the patient. The patient was provided an opportunity to ask questions and all were answered. The patient agreed with the plan and demonstrated an understanding of the instructions.   The patient was advised to call back or seek an in-person evaluation if the symptoms worsen or if the condition fails to improve as anticipated.  I provided 14 minutes of non-face-to-face time during this encounter.   Follow up instructions: Advised assistant Wendie Simmer to help patient arrange the following: -follow up Dr. Maudie Mercury 3-4 months -lab visit in next 1 week -stool cards for colon cancer screening -glucose monitor  [name], DO

## 2019-04-13 NOTE — Patient Instructions (Signed)
Lab appointment in the next 1 week, my assistant should be calling you to set that up. Please call if not so that we can arrange.  Healthy low sugar diet and regular aerobic exercise advised.  Schedule your diabetic eye exam and send [place] a copy of the report.  Do your colon cancer screening.

## 2019-04-13 NOTE — Telephone Encounter (Signed)
I left a message for the pt to return my call. 

## 2019-04-13 NOTE — Telephone Encounter (Signed)
-----   Message from [name], DO sent at 04/13/2019 10:24 AM EDT ----- -follow up Dr. Maudie Mercury 3-4 months-lab visit in next 1 week-stool cards for colon cancer screening-glucose monitor

## 2019-04-14 MED ORDER — ACCU-CHEK GUIDE VI STRP: ORAL_STRIP | 3 refills | Status: DC

## 2019-04-14 MED ORDER — ACCU-CHEK FASTCLIX LANCETS MISC: 3 refills | Status: DC

## 2019-04-14 NOTE — Telephone Encounter (Signed)
Patient returning call to Wendie Simmer. Please advise.

## 2019-04-14 NOTE — Telephone Encounter (Signed)
I called the pt and scheduled appts as below.  Also informed the pt an Accu-Chek Guide Me glucometer sample was left at the front desk for pick up and the supplies were sent to her pharmacy and the test for colon cancer screening also.

## 2019-04-20 ENCOUNTER — Other Ambulatory Visit: Payer: Self-pay

## 2019-04-20 ENCOUNTER — Other Ambulatory Visit: Payer: BLUE CROSS/BLUE SHIELD

## 2019-04-20 LAB — BASIC METABOLIC PANEL
BUN: 14 mg/dL (ref 6–23)
CO2: 29 mEq/L (ref 19–32)
Calcium: 8.8 mg/dL (ref 8.4–10.5)
Chloride: 101 mEq/L (ref 96–112)
Creatinine, Ser: 0.7 mg/dL (ref 0.40–1.20)
GFR: 102.1 mL/min (ref >60.00)
Glucose, Bld: 103 mg/dL — ABNORMAL HIGH (ref 70–99)
Potassium: 4.3 mEq/L (ref 3.5–5.1)
Sodium: 137 mEq/L (ref 135–145)

## 2019-04-20 LAB — CBC
HCT: 39.2 % (ref 36.0–46.0)
Hemoglobin: 13 g/dL (ref 12.0–15.0)
MCHC: 33.1 g/dL (ref 30.0–36.0)
MCV: 87.7 fl (ref 78.0–100.0)
Platelets: 265 10*3/uL (ref 150.0–400.0)
RBC: 4.46 Mil/uL (ref 3.87–5.11)
RDW: 16.6 % — ABNORMAL HIGH (ref 11.5–15.5)
WBC: 6.2 10*3/uL (ref 4.0–10.5)

## 2019-04-20 LAB — HEMOGLOBIN A1C: Hgb A1c MFr Bld: 6 % (ref 4.6–6.5)

## 2019-04-20 NOTE — Addendum Note (Signed)
Addended by: Elmer Picker on: 04/20/2019 08:08 AM   Modules accepted: Orders

## 2019-04-22 ENCOUNTER — Telehealth: Payer: Self-pay | Admitting: Family Medicine

## 2019-04-22 ENCOUNTER — Ambulatory Visit (INDEPENDENT_AMBULATORY_CARE_PROVIDER_SITE_OTHER): Payer: BLUE CROSS/BLUE SHIELD | Admitting: Family Medicine

## 2019-04-22 ENCOUNTER — Encounter: Payer: Self-pay | Admitting: Family Medicine

## 2019-04-22 ENCOUNTER — Telehealth: Payer: Self-pay | Admitting: *Deleted

## 2019-04-22 ENCOUNTER — Other Ambulatory Visit: Payer: Self-pay

## 2019-04-22 VITALS — BP 122/65 | HR 63 | Temp 93.3°F

## 2019-04-22 DIAGNOSIS — Z634 Disappearance and death of family member: Secondary | ICD-10-CM

## 2019-04-22 DIAGNOSIS — F439 Reaction to severe stress, unspecified: Secondary | ICD-10-CM

## 2019-04-22 NOTE — Telephone Encounter (Signed)
Pt called stating she is experiencing lots of anxiety as she has had to put her mother in hospice last night. Pt is requesting something to keep her calm. Please advise.   [facility] [phone] - [address], [address] AT [address]  [place] [address]  Phone: [phone] Fax: [phone]  Not a 24 hour pharmacy; exact hours not known.

## 2019-04-22 NOTE — Progress Notes (Signed)
Virtual Visit via Telephone Note  I connected with Yvette Roberts on 04/22/19 at 11:40 AM EDT by telephone and verified that I am speaking with the correct person using two identifiers.   I discussed the limitations, risks, security and privacy concerns of performing an evaluation and management service by telephone and the availability of in person appointments. I also discussed with the patient that there may be a patient responsible charge related to this service. The patient expressed understanding and agreed to proceed.  Location patient: home Location provider: work or home office Participants present for the call: patient, provider Patient did not have a visit in the prior 7 days to address this/these issue(s).   History of Present Illness:  Acute visit for stress: -her mother has [place] and is in hospice at her SNF -Yvette Roberts is not allowed to visit her  -she is stressed and is grieving and her sleep has been poor -she has not worked this week -no panic attacks, thoughts of harm, SI, hallucinations or severe symptoms -does not want to change her medication nor do CBT or hospice counseling right now - feels has too much going on -was wondering about melatonin to help with sleep and dosing   Observations/Objective: Patient sounds cheerful and well on the phone. I do not appreciate any SOB. Speech and thought processing are grossly intact. Patient reported vitals:  Assessment and Plan:  Bereavement Stress  Discussed options for management, risks and precautions. She has declined behavioral health help/CBT for now. She wants to try melatonin 3-5 mg every other night as needed for sleep. Agrees to follow up if any worsening or other concerns.  Follow Up Instructions:   I did not refer this patient for an OV in the next 24 hours for this/these issue(s).  I discussed the assessment and treatment plan with the patient. The patient was provided an opportunity to ask questions and  all were answered. The patient agreed with the plan and demonstrated an understanding of the instructions.   The patient was advised to call back or seek an in-person evaluation if the symptoms worsen or if the condition fails to improve as anticipated.  I provided 11 minutes of non-face-to-face time during this encounter.   Follow up instructions: Advised assistant Wendie Simmer to help patient arrange the following: -please provide her with the information to schedule with Dennison Bulla if needed with Behavioral health when she is ready. If you could mail her a brochure that would be great.  [name], DO

## 2019-04-22 NOTE — Telephone Encounter (Signed)
Per office notes from 6/25, I called the pt and gave her the number to call [facility] at [phone].

## 2019-04-28 ENCOUNTER — Other Ambulatory Visit: Payer: Self-pay | Admitting: *Deleted

## 2019-04-28 DIAGNOSIS — Z20822 Contact with and (suspected) exposure to covid-19: Secondary | ICD-10-CM

## 2019-05-03 LAB — NOVEL CORONAVIRUS, NAA: SARS-CoV-2, NAA: NOT DETECTED

## 2019-06-06 ENCOUNTER — Other Ambulatory Visit: Payer: Self-pay | Admitting: Family Medicine

## 2019-06-07 ENCOUNTER — Other Ambulatory Visit: Payer: Self-pay | Admitting: Family Medicine

## 2019-06-16 ENCOUNTER — Encounter: Payer: BLUE CROSS/BLUE SHIELD | Admitting: Family Medicine

## 2019-07-05 ENCOUNTER — Other Ambulatory Visit: Payer: Self-pay | Admitting: Family Medicine

## 2019-07-15 ENCOUNTER — Ambulatory Visit (INDEPENDENT_AMBULATORY_CARE_PROVIDER_SITE_OTHER): Payer: BLUE CROSS/BLUE SHIELD | Admitting: Family Medicine

## 2019-07-15 ENCOUNTER — Other Ambulatory Visit: Payer: Self-pay

## 2019-07-15 ENCOUNTER — Encounter: Payer: Self-pay | Admitting: Family Medicine

## 2019-07-15 VITALS — BP 117/75 | HR 64 | Wt 230.0 lb

## 2019-07-15 DIAGNOSIS — F325 Major depressive disorder, single episode, in full remission: Secondary | ICD-10-CM | POA: Diagnosis not present

## 2019-07-15 DIAGNOSIS — Z634 Disappearance and death of family member: Secondary | ICD-10-CM

## 2019-07-15 DIAGNOSIS — M25539 Pain in unspecified wrist: Secondary | ICD-10-CM | POA: Diagnosis not present

## 2019-07-15 DIAGNOSIS — Z0000 Encounter for general adult medical examination without abnormal findings: Secondary | ICD-10-CM

## 2019-07-15 DIAGNOSIS — E119 Type 2 diabetes mellitus without complications: Secondary | ICD-10-CM

## 2019-07-15 DIAGNOSIS — E1159 Type 2 diabetes mellitus with other circulatory complications: Secondary | ICD-10-CM

## 2019-07-15 DIAGNOSIS — I152 Hypertension secondary to endocrine disorders: Secondary | ICD-10-CM

## 2019-07-15 DIAGNOSIS — I10 Essential (primary) hypertension: Secondary | ICD-10-CM

## 2019-07-15 NOTE — Progress Notes (Signed)
3-4 months would be find if she has something available then? If not you can do a follow up with me then and a CPE/TOC in 6 months with Dr. Ethlyn Gallery. Thanks!

## 2019-07-15 NOTE — Patient Instructions (Signed)
Norris,  Try the Splint as we discussed for the wrist. Please let me know if it is not getting better over the next 1-2 weeks.  Please complete the Cologuard test for colon cancer screening.  Please set up your gynecology exam.  Eat a healthy low carb, low sugar diet and get at least 150 minutes of exercise per week.  Follow up in 3-4 months for in person physical with Dr. Ethlyn Gallery or with me for follow up virtually if she is not available.

## 2019-07-15 NOTE — Progress Notes (Signed)
Virtual Visit via Telephone Note  I connected with Yvette Roberts on 07/15/19 at 10:00 AM EDT by telephone and verified that I am speaking with the correct person using two identifiers.   I discussed the limitations, risks, security and privacy concerns of performing an evaluation and management service by telephone and the availability of in person appointments. I also discussed with the patient that there may be a patient responsible charge related to this service. The patient expressed understanding and agreed to proceed.  Location patient: home Location provider: work or home office Participants present for the call: patient, provider Patient did not have a visit in the prior 7 days to address this/these issue(s).   History of Present Illness:  Yvette Roberts is a pleasant 63 yo woth a PMH significant for the problems listed below. We talk by phone in follow up of her multiple chronic conditions. Overall she feels she is doing well. Reports is recovering from grieving the loss of her mother to [place]. Feel is doing ok now. Has good support. She is going to get her cologuard sent in for colon cancer screening. Is checking her BP daily and feels well. Has OA. Has had some R wrist pain the last few weeks with certain movements. Mild, but has noticed it. No known trauma or inciting event.  Hypertension: -meds include norvasc, coreg, losartan, spironolactone -check BP and reports running good  DM, HLD,MorbidObesity: -meds: none, delcined -diabetes diet controlled -reports diet ok, eating less  Depression: -meds: prozac -see phq9 -no SI  GERD: -meds: prilosec  OA: -sees orthopedic specialist  Observations/Objective: Patient sounds cheerful and well on the phone. I do not appreciate any SOB. Speech and thought processing are grossly intact. Patient reported vitals:  Assessment and Plan:  Pain in wrist, unspecified laterality -discussed potential etiologies of wrist  pain and options for evaluation -opted to try wrist splint for 1-2 weeks -advised to let us know if any issues persist and she agrees to do so. Would do sports med eval if persists.  Major depressive disorder with single episode, in full remission ([place]) Bereavement -feels she is coping ok despite the loss of her mother to COVID -cont Paxil  Type 2 diabetes mellitus without complication, without long-term current use of insulin ([place]) -cont current treatment, labs done in June - labs again next visit, lifestyle recs  Hypertension associated with diabetes ([place]) -cont current tx  Morbid obesity ([place]) -see Set designer  Healthcare maintenance -advised colon ca screening - she agrees to complete cologuard, has at home -she id due for gyn exam and agrees to schedule    Follow Up Instructions:  Follow up in 3-4 months, will need TOC with Dr. Ethlyn Gallery for next in person exam.  I did not refer this patient for an OV in the next 24 hours for this/these issue(s).  I discussed the assessment and treatment plan with the patient. The patient was provided an opportunity to ask questions and all were answered. The patient agreed with the plan and demonstrated an understanding of the instructions.   The patient was advised to call back or seek an in-person evaluation if the symptoms worsen or if the condition fails to improve as anticipated.  I provided 11 minutes of non-face-to-face time during this encounter.   [name], DO

## 2019-10-12 ENCOUNTER — Encounter: Payer: Self-pay | Admitting: Family Medicine

## 2019-10-12 ENCOUNTER — Other Ambulatory Visit: Payer: Self-pay

## 2019-10-12 ENCOUNTER — Telehealth (INDEPENDENT_AMBULATORY_CARE_PROVIDER_SITE_OTHER): Payer: BLUE CROSS/BLUE SHIELD | Admitting: Family Medicine

## 2019-10-12 VITALS — BP 114/78 | HR 65 | Temp 97.8°F | Wt 235.0 lb

## 2019-10-12 DIAGNOSIS — F325 Major depressive disorder, single episode, in full remission: Secondary | ICD-10-CM

## 2019-10-12 DIAGNOSIS — E1159 Type 2 diabetes mellitus with other circulatory complications: Secondary | ICD-10-CM

## 2019-10-12 DIAGNOSIS — E119 Type 2 diabetes mellitus without complications: Secondary | ICD-10-CM

## 2019-10-12 DIAGNOSIS — I152 Hypertension secondary to endocrine disorders: Secondary | ICD-10-CM

## 2019-10-12 DIAGNOSIS — I10 Essential (primary) hypertension: Secondary | ICD-10-CM | POA: Diagnosis not present

## 2019-10-12 MED ORDER — FLUOXETINE HCL 40 MG PO CAPS: ORAL_CAPSULE | ORAL | 1 refills | Status: DC

## 2019-10-12 NOTE — Progress Notes (Signed)
Virtual Visit via Video Note  I connected with Yvette Roberts  on 10/12/19 at 11:00 AM EST by a video enabled telemedicine application and verified that I am speaking with the correct person using two identifiers.  Location patient: home Location provider:work or home office Persons participating in the virtual visit: patient, provider  I discussed the limitations of evaluation and management by telemedicine and the availability of in person appointments. The patient expressed understanding and agreed to proceed.   HPI:   Seen in follow up. Wrist pain resolved. Feel mood is ok, but holidays are tough after the loss of her mother to COVID earlier this year. BP has been goo and has had weight reduction. Needs refill on the prozac. Prefers to hold off on lab in office for now 2ndary to [name] pandemic and perhaps do at next follow up.   Hypertension: -meds include norvasc, coreg, losartan, spironolactone -check BP and reports running good  DM, HLD,MorbidObesity: -meds: none, delcined -diabetes diet controlled -reports diet ok, eating less  Depression: -meds: prozac -see phq9 -no SI  GERD: -meds: prilosec  OA: -sees orthopedic specialist [name]: See pertinent positives and negatives per HPI.  Past Medical History:  Diagnosis Date  . Allergy   . Arthritis   . Depression   . Esophageal reflux 07/21/2014  . Frozen shoulder 05/10/2015   Injected 05/10/2015   . Obesity, unspecified 07/21/2014  . Resistant hypertension 07/21/2014  . Type 2 diabetes mellitus without complication ([place]) 5/78/4696    Past Surgical History:  Procedure Laterality Date  . LAPAROSCOPIC ABDOMINAL EXPLORATION    . miscarr      Family History  Problem Relation Age of Onset  . Breast cancer Sister        ? age of onset    SOCIAL HX: see hpi   Current Outpatient Medications:  .  Accu-Chek FastClix Lancets MISC, Use as directed, Disp: 100 each, Rfl: 3 .  amLODipine (NORVASC) 10 MG tablet, TAKE 1  TABLET(10 MG) BY MOUTH DAILY, Disp: 90 tablet, Rfl: 1 .  aspirin 81 MG tablet, Take 81 mg by mouth daily., Disp: , Rfl:  .  carvedilol (COREG) 12.5 MG tablet, TAKE 1 TABLET(12.5 MG) BY MOUTH TWICE DAILY WITH A MEAL, Disp: 180 tablet, Rfl: 1 .  cholecalciferol (VITAMIN D) 1000 UNITS tablet, Take 4,000 Units by mouth daily. , Disp: , Rfl:  .  FLUoxetine (PROZAC) 40 MG capsule, TAKE 1 CAPSULE(40 MG) BY MOUTH DAILY, Disp: 90 capsule, Rfl: 1 .  gabapentin (NEURONTIN) 100 MG capsule, Take 2 capsules (200 mg total) by mouth at bedtime., Disp: 60 capsule, Rfl: 3 .  glucose blood (ACCU-CHEK GUIDE) test strip, Use as directed once a day, Disp: 100 each, Rfl: 3 .  losartan (COZAAR) 100 MG tablet, TAKE 1 TABLET(100 MG) BY MOUTH DAILY, Disp: 90 tablet, Rfl: 1 .  MELATONIN PO, Take 1 mg by mouth at bedtime., Disp: , Rfl:  .  omeprazole (PRILOSEC) 40 MG capsule, Take 1 capsule (40 mg total) by mouth daily., Disp: 90 capsule, Rfl: 1 .  spironolactone (ALDACTONE) 25 MG tablet, Take 12.5 mg by mouth 2 (two) times daily., Disp: , Rfl: 0 .  VITAMIN D PO, Take 5,000 Units by mouth daily., Disp: , Rfl:   EXAM:  VITALS per patient if applicable: Vitals:   29/52/84 0917  BP: 114/78  Pulse: 65  Temp: 97.8 F (36.6 C)  Body mass index is 37.93 kg/m.    GENERAL: alert, oriented, appears well and in no  acute distress  HEENT: atraumatic, conjunttiva clear, no obvious abnormalities on inspection of external nose and ears  NECK: normal movements of the head and neck  LUNGS: on inspection no signs of respiratory distress, breathing rate appears normal, no obvious gross SOB, gasping or wheezing  CV: no obvious cyanosis  MS: moves all visible extremities without noticeable abnormality  PSYCH/NEURO: pleasant and cooperative, no obvious depression or anxiety, speech and thought processing grossly intact  ASSESSMENT AND PLAN:  Discussed the following assessment and plan:  Type 2 diabetes mellitus without  complication, without long-term current use of insulin (HCC)  Hypertension associated with diabetes ([place])  Morbid obesity (HCC)  Major depressive disorder with single episode, in full remission ([place])  -counseled -offered labs - she opted to hold off until next appt due to pandemic -refills per request provided -needs PCP, she prefers to stick with [facility] - message sent to admin to schedule   I discussed the assessment and treatment plan with the patient. The patient was provided an opportunity to ask questions and all were answered. The patient agreed with the plan and demonstrated an understanding of the instructions.   The patient was advised to call back or seek an in-person evaluation if the symptoms worsen or if the condition fails to improve as anticipated.   [name], DO

## 2019-11-23 ENCOUNTER — Other Ambulatory Visit: Payer: Self-pay | Admitting: Family Medicine

## 2019-11-24 ENCOUNTER — Other Ambulatory Visit: Payer: Self-pay | Admitting: Family Medicine

## 2019-12-05 ENCOUNTER — Other Ambulatory Visit: Payer: Self-pay | Admitting: Family Medicine

## 2019-12-08 ENCOUNTER — Other Ambulatory Visit: Payer: Self-pay | Admitting: Family Medicine

## 2019-12-24 ENCOUNTER — Other Ambulatory Visit: Payer: Self-pay | Admitting: Family Medicine

## 2020-01-07 ENCOUNTER — Other Ambulatory Visit: Payer: Self-pay | Admitting: Family Medicine

## 2020-01-21 ENCOUNTER — Other Ambulatory Visit: Payer: Self-pay | Admitting: Family Medicine

## 2020-02-04 ENCOUNTER — Other Ambulatory Visit: Payer: Self-pay | Admitting: Family Medicine

## 2020-02-17 ENCOUNTER — Other Ambulatory Visit: Payer: Self-pay | Admitting: Family Medicine

## 2020-02-20 ENCOUNTER — Other Ambulatory Visit: Payer: Self-pay | Admitting: Family Medicine

## 2020-03-01 ENCOUNTER — Other Ambulatory Visit: Payer: Self-pay | Admitting: Family Medicine

## 2020-03-03 ENCOUNTER — Other Ambulatory Visit: Payer: Self-pay | Admitting: Family Medicine

## 2020-03-20 ENCOUNTER — Other Ambulatory Visit: Payer: Self-pay | Admitting: Family Medicine

## 2020-03-31 ENCOUNTER — Other Ambulatory Visit: Payer: Self-pay | Admitting: Family Medicine

## 2020-03-31 ENCOUNTER — Emergency Department: Admission: EM | Admit: 2020-03-31 | Discharge: 2020-03-31 | Discharge status: Absent without leave | Payer: Self-pay

## 2020-04-07 ENCOUNTER — Ambulatory Visit
Admission: EM | Admit: 2020-04-07 | Discharge: 2020-04-07 | Disposition: A | Discharge status: Home / Self Care | Payer: BLUE CROSS/BLUE SHIELD | Attending: Physician Assistant | Admitting: Physician Assistant

## 2020-04-07 ENCOUNTER — Other Ambulatory Visit: Payer: Self-pay

## 2020-04-07 DIAGNOSIS — Z760 Encounter for issue of repeat prescription: Secondary | ICD-10-CM | POA: Diagnosis not present

## 2020-04-07 DIAGNOSIS — M25531 Pain in right wrist: Secondary | ICD-10-CM

## 2020-04-07 DIAGNOSIS — M7661 Achilles tendinitis, right leg: Secondary | ICD-10-CM

## 2020-04-07 MED ORDER — MELOXICAM 7.5 MG PO TABS
7.5000 mg | ORAL_TABLET | Freq: Every day | ORAL | 0 refills | Status: DC
Start: 2020-04-07 — End: 2020-11-24

## 2020-04-07 MED ORDER — CARVEDILOL 12.5 MG PO TABS: ORAL_TABLET | ORAL | 0 refills | Status: DC

## 2020-04-07 NOTE — ED Provider Notes (Signed)
[facility]    CSN: [id number] Arrival date & time: 04/07/20  1559      History   Chief Complaint Chief Complaint  Patient presents with  . Medication Refill  . Wrist Pain  . Ankle Pain    HPI Yvette Roberts is a 64 y.o. female.   64 year old female comes in for multiple complaints.  1.  Medication refill.  Patient in between PCPs at this time, ran out of Coreg and requesting refill.  Has been on this for many years, and has not needed to change in dosage.  No obvious side effects from medication.  2.  Right ankle pain x3 days.  No obvious injury/trauma.  Pain is to the posterior heel, worse with range of motion and weightbearing.  Denies numbness, tingling, swelling, erythema, warmth.  Voltaren gel at home without relief.  3.  Right wrist pain x3 days.  States was moving heavy mattress, and thinks this could have caused it.  No pain at rest, pain with range of motion.  Denies numbness, tingling, swelling, erythema, warmth.  Denies loss of grip strength.  Voltaren gel without relief.     Past Medical History:  Diagnosis Date  . Allergy   . Arthritis   . Depression   . Esophageal reflux 07/21/2014  . Frozen shoulder 05/10/2015   Injected 05/10/2015   . Obesity, unspecified 07/21/2014  . Resistant hypertension 07/21/2014  . Type 2 diabetes mellitus without complication ([place]) 0/06/3817    Patient Active Problem List   Diagnosis Date Noted  . Trigger point of right shoulder region 03/09/2019  . Cervical radiculopathy at C8 03/09/2019  . Unilateral primary osteoarthritis, left knee 11/17/2017  . Unilateral primary osteoarthritis, right knee 11/17/2017  . Degenerative arthritis of knee, bilateral 07/14/2017  . Baker cyst, left 06/16/2017  . Pain and swelling of left lower extremity 05/26/2017  . Degenerative joint disease of knee, left 05/26/2017  . Hyperlipemia 06/12/2016  . MDD (major depressive disorder) 06/12/2016  . Urticaria 06/12/2016  . Type 2  diabetes mellitus without complication ([place]) 29/93/7169  . Obesity 07/21/2014  . Resistant hypertension 07/21/2014  . Esophageal reflux 07/21/2014    Past Surgical History:  Procedure Laterality Date  . LAPAROSCOPIC ABDOMINAL EXPLORATION    . miscarr      OB History   No obstetric history on file.      Home Medications    Prior to Admission medications   Medication Sig Start Date End Date Taking? Authorizing Provider  Accu-Chek FastClix Lancets MISC Use as directed 04/14/19   Colin Benton R, DO  amLODipine (NORVASC) 10 MG tablet TAKE 1 TABLET(10 MG) BY MOUTH DAILY 03/31/20   [name], DO  aspirin 81 MG tablet Take 81 mg by mouth daily.    [provider]  carvedilol (COREG) 12.5 MG tablet TAKE 1 TABLET(12.5 MG) BY MOUTH TWICE DAILY WITH A MEAL 04/07/20   Zehra Rucci V, PA-C  cholecalciferol (VITAMIN D) 1000 UNITS tablet Take 4,000 Units by mouth daily.     [provider]  FLUoxetine (PROZAC) 40 MG capsule TAKE 1 CAPSULE(40 MG) BY MOUTH DAILY 12/08/19   [name], DO  gabapentin (NEURONTIN) 100 MG capsule Take 2 capsules (200 mg total) by mouth at bedtime. 03/09/19   Lyndal Pulley, DO  glucose blood (ACCU-CHEK GUIDE) test strip Use as directed once a day 04/14/19   [name], DO  losartan (COZAAR) 100 MG tablet TAKE 1 TABLET(100 MG) BY MOUTH  DAILY 06/08/19   Colin Benton R, DO  MELATONIN PO Take 1 mg by mouth at bedtime.    [provider]  meloxicam (MOBIC) 7.5 MG tablet Take 1 tablet (7.5 mg total) by mouth daily. 04/07/20   Tasia Catchings, Esty Ahuja V, PA-C  omeprazole (PRILOSEC) 40 MG capsule TAKE 1 CAPSULE(40 MG) BY MOUTH DAILY 03/20/20   [name], DO  spironolactone (ALDACTONE) 25 MG tablet Take 12.5 mg by mouth 2 (two) times daily. 02/02/16   [provider]  VITAMIN D PO Take 5,000 Units by mouth daily.    [provider]    Family History Family History  Problem Relation Age of Onset  . Breast cancer Sister        ? age of onset     Social History Social History   Tobacco Use  . Smoking status: Never Smoker  . Smokeless tobacco: Never Used  Substance Use Topics  . Alcohol use: Not on file  . Drug use: Not on file     Allergies   Eggs or egg-derived products, Influenza vaccines, Peanut-containing drug products, Penicillins, and Sulfa antibiotics   Review of Systems Review of Systems  Reason unable to perform [name]: See HPI as above.     Physical Exam Triage Vital Signs ED Triage Vitals [04/07/20 1605]  Enc Vitals Group     BP (!) 149/87     Pulse Rate 70     Resp 16     Temp 98.7 F (37.1 C)     Temp src      SpO2 97 %     Weight      Height      Head Circumference      Peak Flow      Pain Score 8     Pain Loc      Pain Edu?      Excl. in [place]?    No data found.  Updated Vital Signs BP (!) 149/87   Pulse 70   Temp 98.7 F (37.1 C)   Resp 16   SpO2 97%   Physical Exam Constitutional:      General: She is not in acute distress.    Appearance: Normal appearance. She is well-developed. She is not toxic-appearing or diaphoretic.  HENT:     Head: Normocephalic and atraumatic.  Eyes:     Conjunctiva/sclera: Conjunctivae normal.     Pupils: Pupils are equal, round, and reactive to light.  Cardiovascular:     Rate and Rhythm: Normal rate and regular rhythm.  Pulmonary:     Effort: Pulmonary effort is normal. No respiratory distress.     Comments: Speaking in full sentences without difficulty. LCTAB Musculoskeletal:     Cervical back: Normal range of motion and neck supple.     Comments: Right wrist: No swelling, erythema, warmth, contusion.  Tenderness to palpation of dorsal and ventral aspect of the wrist.  Full range of motion, with supination and pronation causing pain.  Strength 5/5 bilaterally.  Sensation intact.  Radial pulse 2+.   Right ankle: No swelling, erythema, warmth, contusion.  Tenderness to palpation along Achilles tendon.  No tenderness to plantar surface.  Full  range of motion.  Strength 5/5.  Sensation intact.  Skin:    General: Skin is warm and dry.  Neurological:     Mental Status: She is alert and oriented to person, place, and time.      UC Treatments / Results  Labs (all labs ordered  are listed, but only abnormal results are displayed) Labs Reviewed - No data to display  EKG   Radiology No results found.  Procedures Procedures (including critical care time)  Medications Ordered in UC Medications - No data to display  Initial Impression / Assessment and Plan / UC Course  I have reviewed the triage vital signs and the nursing notes.  Pertinent labs & imaging results that were available during my care of the patient were reviewed by me and considered in my medical decision making (see chart for details).    1.  Medication refill Carvedilol called in for 90 days.  Patient to establish with new PCP and follow-up for further refills.  Return precautions given.  2.  Right wrist pain Likely due to tendinitis.  Will start NSAIDs, ice compress, wrist splint during activity.  Expected course of healing discussed.  Return precautions given.  3.  Right Achilles tendinitis Start NSAIDs as above, ice compress, rest.  Expected course of healing discussed.  Return precautions given.  Patient expresses understanding and agrees to plan.  Final Clinical Impressions(s) / UC Diagnoses   Final diagnoses:  Medication refill  Right wrist pain  Achilles tendinitis of right lower extremity    ED Prescriptions    Medication Sig Dispense Auth. Provider   carvedilol (COREG) 12.5 MG tablet TAKE 1 TABLET(12.5 MG) BY MOUTH TWICE DAILY WITH A MEAL 180 tablet Arlan Birks V, PA-C   meloxicam (MOBIC) 7.5 MG tablet Take 1 tablet (7.5 mg total) by mouth daily. 15 tablet Ok Edwards, PA-C     PDMP not reviewed this encounter.   Ok Edwards, PA-C 04/07/20 1709

## 2020-04-07 NOTE — Discharge Instructions (Signed)
Medication refill Carvedilol refilled for 90 days.  Please follow-up with PCP for further refills.  Right wrist pain/right ankle pain This is due to inflammation. Start Mobic. Do not take ibuprofen (motrin/advil)/ naproxen (aleve) while on mobic.  You can continue Voltaren gel.  Ice compress.  Wrist splint during activity.  Follow-up with sports medicine if symptoms not improving.

## 2020-04-07 NOTE — ED Triage Notes (Addendum)
Pt requesting coreg refill, unable to get into PCP until July.   Pt also c/o right ankle and right wrist pain x 3 days, Voltaren at home not helping

## 2020-04-21 ENCOUNTER — Other Ambulatory Visit: Payer: Self-pay | Admitting: Family Medicine

## 2020-04-27 ENCOUNTER — Other Ambulatory Visit: Payer: Self-pay | Admitting: Family Medicine

## 2020-05-06 ENCOUNTER — Other Ambulatory Visit: Payer: Self-pay | Admitting: Family Medicine

## 2020-05-09 DIAGNOSIS — F419 Anxiety disorder, unspecified: Secondary | ICD-10-CM | POA: Insufficient documentation

## 2020-06-19 ENCOUNTER — Other Ambulatory Visit: Payer: Self-pay | Admitting: Family Medicine

## 2020-08-18 ENCOUNTER — Other Ambulatory Visit: Payer: Self-pay | Admitting: Family Medicine

## 2020-11-15 ENCOUNTER — Ambulatory Visit (INDEPENDENT_AMBULATORY_CARE_PROVIDER_SITE_OTHER): Payer: Medicare Other

## 2020-11-15 ENCOUNTER — Other Ambulatory Visit: Payer: Self-pay

## 2020-11-15 ENCOUNTER — Encounter: Payer: Self-pay | Admitting: Emergency Medicine

## 2020-11-15 ENCOUNTER — Ambulatory Visit
Admission: EM | Admit: 2020-11-15 | Discharge: 2020-11-15 | Disposition: A | Discharge status: Home / Self Care | Payer: Medicare Other | Attending: Emergency Medicine | Admitting: Emergency Medicine

## 2020-11-15 DIAGNOSIS — R1084 Generalized abdominal pain: Secondary | ICD-10-CM

## 2020-11-15 DIAGNOSIS — K219 Gastro-esophageal reflux disease without esophagitis: Secondary | ICD-10-CM

## 2020-11-15 DIAGNOSIS — R1030 Lower abdominal pain, unspecified: Secondary | ICD-10-CM

## 2020-11-15 MED ORDER — MAGNESIUM HYDROXIDE 400 MG/5ML PO SUSP: 15.0000 mL | Freq: Every evening | ORAL | 0 refills | Status: DC | PRN

## 2020-11-15 MED ORDER — LIDOCAINE VISCOUS HCL 2 % MT SOLN
15.0000 mL | Freq: Once | OROMUCOSAL | Status: AC
  Administered 2020-11-15: 15 mL via ORAL

## 2020-11-15 MED ORDER — ALUM & MAG HYDROXIDE-SIMETH 200-200-20 MG/5ML PO SUSP
30.0000 mL | Freq: Once | ORAL | Status: AC
  Administered 2020-11-15: 30 mL via ORAL

## 2020-11-15 MED ORDER — FAMOTIDINE 20 MG PO TABS: 20.0000 mg | ORAL_TABLET | Freq: Two times a day (BID) | ORAL | 0 refills | Status: DC | PRN

## 2020-11-15 NOTE — ED Provider Notes (Signed)
[facility]    CSN: [id number] Arrival date & time: 11/15/20  1050      History   Chief Complaint Chief Complaint  Patient presents with  . Abdominal Pain    HPI Yvette Roberts is a 65 y.o. female history of DM type II, hypertension, presenting today for evaluation of abdominal pain and bloating.  Reports she has had normal bowel movements.  Symptoms worse after eating.  Reports that she has had abdominal discomfort for approximately 2 weeks feeling bloated and distended.  She has been passing bowel movements regularly at her baseline, every other day.  Denies any significant straining.  She also reports increase in acid and indigestion in her upper abdomen and chest.  Reports discomfort in this area currently.  She denies any urinary symptoms.  HPI  Past Medical History:  Diagnosis Date  . Allergy   . Arthritis   . Depression   . Esophageal reflux 07/21/2014  . Frozen shoulder 05/10/2015   Injected 05/10/2015   . Obesity, unspecified 07/21/2014  . Resistant hypertension 07/21/2014  . Type 2 diabetes mellitus without complication ([place]) 01/23/9241    Patient Active Problem List   Diagnosis Date Noted  . Trigger point of right shoulder region 03/09/2019  . Cervical radiculopathy at C8 03/09/2019  . Unilateral primary osteoarthritis, left knee 11/17/2017  . Unilateral primary osteoarthritis, right knee 11/17/2017  . Degenerative arthritis of knee, bilateral 07/14/2017  . Baker cyst, left 06/16/2017  . Pain and swelling of left lower extremity 05/26/2017  . Degenerative joint disease of knee, left 05/26/2017  . Hyperlipemia 06/12/2016  . MDD (major depressive disorder) 06/12/2016  . Urticaria 06/12/2016  . Type 2 diabetes mellitus without complication ([place]) 68/34/1962  . Obesity 07/21/2014  . Resistant hypertension 07/21/2014  . Esophageal reflux 07/21/2014    Past Surgical History:  Procedure Laterality Date  . LAPAROSCOPIC ABDOMINAL EXPLORATION    .  miscarr      OB History   No obstetric history on file.      Home Medications    Prior to Admission medications   Medication Sig Start Date End Date Taking? Authorizing Provider  famotidine (PEPCID) 20 MG tablet Take 1 tablet (20 mg total) by mouth 2 (two) times daily as needed for heartburn or indigestion. 11/15/20  Yes Doriann Zuch C, PA-C  magnesium hydroxide (MILK OF MAGNESIA) 400 MG/5ML suspension Take 15 mLs by mouth at bedtime as needed. 11/15/20  Yes Earnestine Shipp, [place] C, PA-C  Accu-Chek [employer] Use as directed 04/14/19   Colin Benton R, DO  amLODipine (NORVASC) 10 MG tablet TAKE 1 TABLET(10 MG) BY MOUTH DAILY 04/27/20   [name], DO  aspirin 81 MG tablet Take 81 mg by mouth daily.    [provider]  carvedilol (COREG) 12.5 MG tablet TAKE 1 TABLET(12.5 MG) BY MOUTH TWICE DAILY WITH A MEAL 04/07/20   Yu, Amy V, PA-C  cholecalciferol (VITAMIN D) 1000 UNITS tablet Take 4,000 Units by mouth daily.     [provider]  FLUoxetine (PROZAC) 40 MG capsule TAKE 1 CAPSULE(40 MG) BY MOUTH DAILY 08/21/20   [name], DO  gabapentin (NEURONTIN) 100 MG capsule Take 2 capsules (200 mg total) by mouth at bedtime. 03/09/19   Lyndal Pulley, DO  glucose blood (ACCU-CHEK GUIDE) test strip Use as directed once a day 04/14/19   [name], DO  losartan (COZAAR) 100 MG tablet TAKE 1 TABLET(100 MG) BY MOUTH DAILY 06/20/20  Colin Benton R, DO  MELATONIN PO Take 1 mg by mouth at bedtime.    [provider]  meloxicam (MOBIC) 7.5 MG tablet Take 1 tablet (7.5 mg total) by mouth daily. 04/07/20   Tasia Catchings, Amy V, PA-C  omeprazole (PRILOSEC) 40 MG capsule TAKE 1 CAPSULE(40 MG) BY MOUTH DAILY 03/20/20   [name], DO  spironolactone (ALDACTONE) 25 MG tablet Take 25 mg by mouth 2 (two) times daily. 02/02/16   [provider]  VITAMIN D PO Take 5,000 Units by mouth daily.    [provider]    Family History Family History  Problem Relation Age of  Onset  . Breast cancer Sister        ? age of onset    Social History Social History   Tobacco Use  . Smoking status: Never Smoker  . Smokeless tobacco: Never Used     Allergies   Eggs or egg-derived products, Influenza vaccines, Peanut-containing drug products, Penicillins, and Sulfa antibiotics   Review of Systems Review of Systems  Constitutional: Negative for fatigue and fever.  Eyes: Negative for visual disturbance.  Respiratory: Negative for shortness of breath.   Cardiovascular: Negative for chest pain.  Gastrointestinal: Positive for abdominal pain. Negative for nausea and vomiting.  Musculoskeletal: Negative for arthralgias and joint swelling.  Skin: Negative for color change, rash and wound.  Neurological: Negative for dizziness, weakness, light-headedness and headaches.     Physical Exam Triage Vital Signs ED Triage Vitals  Enc Vitals Group     BP 11/15/20 1359 135/83     Pulse Rate 11/15/20 1359 84     Resp 11/15/20 1359 18     Temp 11/15/20 1359 97.7 F (36.5 C)     Temp src --      SpO2 11/15/20 1359 93 %     Weight --      Height --      Head Circumference --      Peak Flow --      Pain Score 11/15/20 1422 4     Pain Loc --      Pain Edu? --      Excl. in [place]? --    No data found.  Updated Vital Signs BP 135/83   Pulse 84   Temp 97.7 F (36.5 C)   Resp 18   SpO2 93%   Visual Acuity Right Eye Distance:   Left Eye Distance:   Bilateral Distance:    Right Eye Near:   Left Eye Near:    Bilateral Near:     Physical Exam Vitals and nursing note reviewed.  Constitutional:      Appearance: She is well-developed and well-nourished.     Comments: No acute distress  HENT:     Head: Normocephalic and atraumatic.     Nose: Nose normal.  Eyes:     Conjunctiva/sclera: Conjunctivae normal.  Cardiovascular:     Rate and Rhythm: Normal rate and regular rhythm.  Pulmonary:     Effort: Pulmonary effort is normal. No respiratory distress.   Abdominal:     General: There is no distension.     Comments: Soft, mildly distended, tenderness to palpation in epigastrium as well as bilateral lower abdomen, negative rebound, negative Rovsing, negative McBurney's, negative Murphy's  Musculoskeletal:        General: Normal range of motion.     Cervical back: Neck supple.  Skin:    General: Skin is warm and dry.  Neurological:  Mental Status: She is alert and oriented to person, place, and time.  Psychiatric:        Mood and Affect: Mood and affect normal.      UC Treatments / Results  Labs (all labs ordered are listed, but only abnormal results are displayed) Labs Reviewed - No data to display  EKG   Radiology DG Abd 2 Views  Result Date: 11/15/2020 CLINICAL DATA:  Abdominal discomfort for 2 weeks with bloating in a 65 year old female. EXAM: ABDOMEN - 2 VIEW COMPARISON:  None FINDINGS: Bowel gas pattern without signs of obstruction. Small amount of rectal gas and stool. Multiple phleboliths in the pelvis. Elevation of the RIGHT hemidiaphragm, upper margin of the RIGHT hemidiaphragm not imaged due to degree of elevation. No abnormal calcifications over the RIGHT or LEFT renal contours. Extensive spinal degenerative changes. No acute bone finding on limited assessment. IMPRESSION: 1. Nonobstructive bowel gas pattern. 2. Marked elevation of the RIGHT hemidiaphragm, chronicity uncertain, may be indicative of diaphragmatic dysfunction. Correlation with chest radiography or with prior imaging may be helpful. Electronically Signed   By: Zetta Bills M.D.   On: 11/15/2020 15:05    Procedures Procedures (including critical care time)  Medications Ordered in UC Medications  alum & mag hydroxide-simeth (MAALOX/MYLANTA) 200-200-20 MG/5ML suspension 30 mL (30 mLs Oral Given 11/15/20 1513)    And  lidocaine (XYLOCAINE) 2 % viscous mouth solution 15 mL (15 mLs Oral Given 11/15/20 1513)    Initial Impression / Assessment and Plan / UC  Course  I have reviewed the triage vital signs and the nursing notes.  Pertinent labs & imaging results that were available during my care of the patient were reviewed by me and considered in my medical decision making (see chart for details).     X-ray without obstruction, no significant signs of gas or stool noted within the colon.  GI cocktail provided with improvement in discomfort in upper abdomen/chest.  Follow-up continue omeprazole, supplement with Pepcid/Maalox for more immediate relief.  No sign of obstruction, will proceed with recommendations for some constipation, discussed fluids, fiber supplementation, MiraLAX/Colace for 2 to 3 days to monitor for improvement in discomfort with passing bowels more regularly.  Continue to monitor recommend following up with primary care if symptoms persisting- May need further imaging to rule out other causes of bloating such as ovarian cancer or any other GI malignancy.  Discussed strict return precautions. Patient verbalized understanding and is agreeable with plan.  Final Clinical Impressions(s) / UC Diagnoses   Final diagnoses:  Gastroesophageal reflux disease, unspecified whether esophagitis present  Lower abdominal pain     Discharge Instructions     Please continue your omeprazole daily, add in Pepcid or over-the-counter Maalox to further relieve heartburn/indigestion/acid  Please use Miralax for moderate to severe constipation. Take this once a day for the next 2-3 days. Please also start docusate stool softener, twice a day for at least 1 week. If stools become loose, cut down to once a day for another week. If stools remain loose, cut back to 1 pill every other day for a third week. You can stop docusate thereafter and resume as needed for constipation.  May try milk of magnesia before bedtime for more immediate relief of constipation  To help reduce constipation and promote bowel health: 1. Drink at least 64 ounces of water each  day 2. Eat plenty of fiber (fruits, vegetables, whole grains, legumes)-or supplement with daily fiber-Metamucil 3. Be physically active or exercise including walking,  jogging, swimming, yoga, etc. 4. For active constipation use a stool softener (docusate) or an osmotic laxative (like Miralax) each day, or as needed.    ED Prescriptions    Medication Sig Dispense Auth. Provider   famotidine (PEPCID) 20 MG tablet Take 1 tablet (20 mg total) by mouth 2 (two) times daily as needed for heartburn or indigestion. 30 tablet Coni Homesley C, PA-C   magnesium hydroxide (MILK OF MAGNESIA) 400 MG/5ML suspension Take 15 mLs by mouth at bedtime as needed. 355 mL Chelbi Herber, [place] C, PA-C     PDMP not reviewed this encounter.   Janith Lima, [place] 11/15/20 1557

## 2020-11-15 NOTE — Discharge Instructions (Signed)
Please continue your omeprazole daily, add in Pepcid or over-the-counter Maalox to further relieve heartburn/indigestion/acid  Please use Miralax for moderate to severe constipation. Take this once a day for the next 2-3 days. Please also start docusate stool softener, twice a day for at least 1 week. If stools become loose, cut down to once a day for another week. If stools remain loose, cut back to 1 pill every other day for a third week. You can stop docusate thereafter and resume as needed for constipation.  May try milk of magnesia before bedtime for more immediate relief of constipation  To help reduce constipation and promote bowel health: 1. Drink at least 64 ounces of water each day 2. Eat plenty of fiber (fruits, vegetables, whole grains, legumes)-or supplement with daily fiber-Metamucil 3. Be physically active or exercise including walking, jogging, swimming, yoga, etc. 4. For active constipation use a stool softener (docusate) or an osmotic laxative (like Miralax) each day, or as needed.

## 2020-11-15 NOTE — ED Triage Notes (Signed)
Pt sts abd pain and bloating x several days worse after eating; pt sts normal BM

## 2020-11-23 ENCOUNTER — Inpatient Hospital Stay (HOSPITAL_COMMUNITY): Payer: Medicare Other

## 2020-11-23 ENCOUNTER — Emergency Department (HOSPITAL_COMMUNITY): Payer: Medicare Other

## 2020-11-23 ENCOUNTER — Other Ambulatory Visit: Payer: Self-pay

## 2020-11-23 ENCOUNTER — Inpatient Hospital Stay (HOSPITAL_COMMUNITY)
Admission: EM | Admit: 2020-11-23 | Discharge: 2020-11-30 | DRG: 871 | Disposition: A | Discharge status: Home / Self Care | Payer: Medicare Other | Attending: Internal Medicine | Admitting: Internal Medicine

## 2020-11-23 ENCOUNTER — Encounter (HOSPITAL_COMMUNITY): Payer: Self-pay

## 2020-11-23 DIAGNOSIS — Z7982 Long term (current) use of aspirin: Secondary | ICD-10-CM

## 2020-11-23 DIAGNOSIS — J9601 Acute respiratory failure with hypoxia: Secondary | ICD-10-CM

## 2020-11-23 DIAGNOSIS — K219 Gastro-esophageal reflux disease without esophagitis: Secondary | ICD-10-CM | POA: Diagnosis present

## 2020-11-23 DIAGNOSIS — Z882 Allergy status to sulfonamides status: Secondary | ICD-10-CM | POA: Diagnosis not present

## 2020-11-23 DIAGNOSIS — Z20822 Contact with and (suspected) exposure to covid-19: Secondary | ICD-10-CM | POA: Diagnosis present

## 2020-11-23 DIAGNOSIS — J9383 Other pneumothorax: Secondary | ICD-10-CM | POA: Diagnosis not present

## 2020-11-23 DIAGNOSIS — A419 Sepsis, unspecified organism: Principal | ICD-10-CM | POA: Diagnosis present

## 2020-11-23 DIAGNOSIS — R6520 Severe sepsis without septic shock: Secondary | ICD-10-CM | POA: Diagnosis present

## 2020-11-23 DIAGNOSIS — E876 Hypokalemia: Secondary | ICD-10-CM | POA: Diagnosis present

## 2020-11-23 DIAGNOSIS — Z888 Allergy status to other drugs, medicaments and biological substances status: Secondary | ICD-10-CM

## 2020-11-23 DIAGNOSIS — Z91010 Allergy to peanuts: Secondary | ICD-10-CM

## 2020-11-23 DIAGNOSIS — G4700 Insomnia, unspecified: Secondary | ICD-10-CM | POA: Diagnosis present

## 2020-11-23 DIAGNOSIS — Z6839 Body mass index (BMI) 39.0-39.9, adult: Secondary | ICD-10-CM | POA: Diagnosis not present

## 2020-11-23 DIAGNOSIS — J159 Unspecified bacterial pneumonia: Secondary | ICD-10-CM | POA: Diagnosis present

## 2020-11-23 DIAGNOSIS — N179 Acute kidney failure, unspecified: Secondary | ICD-10-CM | POA: Diagnosis present

## 2020-11-23 DIAGNOSIS — E669 Obesity, unspecified: Secondary | ICD-10-CM

## 2020-11-23 DIAGNOSIS — Z9689 Presence of other specified functional implants: Secondary | ICD-10-CM

## 2020-11-23 DIAGNOSIS — R197 Diarrhea, unspecified: Secondary | ICD-10-CM | POA: Diagnosis present

## 2020-11-23 DIAGNOSIS — E871 Hypo-osmolality and hyponatremia: Secondary | ICD-10-CM | POA: Diagnosis present

## 2020-11-23 DIAGNOSIS — E119 Type 2 diabetes mellitus without complications: Secondary | ICD-10-CM | POA: Diagnosis present

## 2020-11-23 DIAGNOSIS — J90 Pleural effusion, not elsewhere classified: Secondary | ICD-10-CM

## 2020-11-23 DIAGNOSIS — J9811 Atelectasis: Secondary | ICD-10-CM | POA: Diagnosis present

## 2020-11-23 DIAGNOSIS — I10 Essential (primary) hypertension: Secondary | ICD-10-CM | POA: Diagnosis present

## 2020-11-23 DIAGNOSIS — Z880 Allergy status to penicillin: Secondary | ICD-10-CM | POA: Diagnosis not present

## 2020-11-23 DIAGNOSIS — Z91012 Allergy to eggs: Secondary | ICD-10-CM

## 2020-11-23 DIAGNOSIS — J189 Pneumonia, unspecified organism: Secondary | ICD-10-CM | POA: Diagnosis not present

## 2020-11-23 DIAGNOSIS — F32A Depression, unspecified: Secondary | ICD-10-CM | POA: Diagnosis present

## 2020-11-23 DIAGNOSIS — Z4682 Encounter for fitting and adjustment of non-vascular catheter: Secondary | ICD-10-CM

## 2020-11-23 DIAGNOSIS — Z79899 Other long term (current) drug therapy: Secondary | ICD-10-CM

## 2020-11-23 DIAGNOSIS — J918 Pleural effusion in other conditions classified elsewhere: Secondary | ICD-10-CM | POA: Diagnosis present

## 2020-11-23 DIAGNOSIS — M1712 Unilateral primary osteoarthritis, left knee: Secondary | ICD-10-CM

## 2020-11-23 LAB — CBC WITH DIFFERENTIAL/PLATELET
Abs Immature Granulocytes: 0.11 10*3/uL — ABNORMAL HIGH (ref 0.00–0.07)
Basophils Absolute: 0 10*3/uL (ref 0.0–0.1)
Basophils Relative: 0 %
Eosinophils Absolute: 0 10*3/uL (ref 0.0–0.5)
Eosinophils Relative: 0 %
HCT: 45.3 % (ref 36.0–46.0)
Hemoglobin: 14.4 g/dL (ref 12.0–15.0)
Immature Granulocytes: 1 %
Lymphocytes Relative: 26 %
Lymphs Abs: 4.2 10*3/uL — ABNORMAL HIGH (ref 0.7–4.0)
MCH: 27.3 pg (ref 26.0–34.0)
MCHC: 31.8 g/dL (ref 30.0–36.0)
MCV: 86 fL (ref 80.0–100.0)
Monocytes Absolute: 1.4 10*3/uL — ABNORMAL HIGH (ref 0.1–1.0)
Monocytes Relative: 9 %
Neutro Abs: 10.7 10*3/uL — ABNORMAL HIGH (ref 1.7–7.7)
Neutrophils Relative %: 64 %
Platelets: 474 10*3/uL — ABNORMAL HIGH (ref 150–400)
RBC: 5.27 MIL/uL — ABNORMAL HIGH (ref 3.87–5.11)
RDW: 15.4 % (ref 11.5–15.5)
WBC: 16.5 10*3/uL — ABNORMAL HIGH (ref 4.0–10.5)
nRBC: 0 % (ref 0.0–0.2)

## 2020-11-23 LAB — LACTATE DEHYDROGENASE, PLEURAL OR PERITONEAL FLUID: LD, Fluid: 1523 U/L — ABNORMAL HIGH (ref 3–23)

## 2020-11-23 LAB — MAGNESIUM: Magnesium: 2.6 mg/dL — ABNORMAL HIGH (ref 1.7–2.4)

## 2020-11-23 LAB — HEPATIC FUNCTION PANEL
ALT: 23 U/L (ref 0–44)
AST: 23 U/L (ref 15–41)
Albumin: 3.4 g/dL — ABNORMAL LOW (ref 3.5–5.0)
Alkaline Phosphatase: 63 U/L (ref 38–126)
Bilirubin, Direct: 0.2 mg/dL (ref 0.0–0.2)
Indirect Bilirubin: 0.7 mg/dL (ref 0.3–0.9)
Total Bilirubin: 0.9 mg/dL (ref 0.3–1.2)
Total Protein: 8.2 g/dL — ABNORMAL HIGH (ref 6.5–8.1)

## 2020-11-23 LAB — BODY FLUID CELL COUNT WITH DIFFERENTIAL
Eos, Fluid: 0 %
Lymphs, Fluid: 25 %
Monocyte-Macrophage-Serous Fluid: 8 % — ABNORMAL LOW (ref 50–90)
Neutrophil Count, Fluid: 67 % — ABNORMAL HIGH (ref 0–25)
Total Nucleated Cell Count, Fluid: 2280 cu mm — ABNORMAL HIGH (ref 0–1000)

## 2020-11-23 LAB — BASIC METABOLIC PANEL
Anion gap: 14 (ref 5–15)
BUN: 21 mg/dL (ref 8–23)
CO2: 22 mmol/L (ref 22–32)
Calcium: 9.5 mg/dL (ref 8.9–10.3)
Chloride: 96 mmol/L — ABNORMAL LOW (ref 98–111)
Creatinine, Ser: 1.28 mg/dL — ABNORMAL HIGH (ref 0.44–1.00)
GFR, Estimated: 46 mL/min — ABNORMAL LOW (ref >60)
Glucose, Bld: 212 mg/dL — ABNORMAL HIGH (ref 70–99)
Potassium: 4.5 mmol/L (ref 3.5–5.1)
Sodium: 132 mmol/L — ABNORMAL LOW (ref 135–145)

## 2020-11-23 LAB — MRSA PCR SCREENING: MRSA by PCR: NEGATIVE

## 2020-11-23 LAB — I-STAT ARTERIAL BLOOD GAS, ED
Acid-base deficit: 2 mmol/L (ref 0.0–2.0)
Bicarbonate: 23.8 mmol/L (ref 20.0–28.0)
Calcium, Ion: 1.22 mmol/L (ref 1.15–1.40)
HCT: 42 % (ref 36.0–46.0)
Hemoglobin: 14.3 g/dL (ref 12.0–15.0)
O2 Saturation: 100 %
Patient temperature: 99
Potassium: 4 mmol/L (ref 3.5–5.1)
Sodium: 131 mmol/L — ABNORMAL LOW (ref 135–145)
TCO2: 25 mmol/L (ref 22–32)
pCO2 arterial: 46 mmHg (ref 32.0–48.0)
pH, Arterial: 7.324 — ABNORMAL LOW (ref 7.350–7.450)
pO2, Arterial: 283 mmHg — ABNORMAL HIGH (ref 83.0–108.0)

## 2020-11-23 LAB — I-STAT VENOUS BLOOD GAS, ED
Acid-base deficit: 6 mmol/L — ABNORMAL HIGH (ref 0.0–2.0)
Bicarbonate: 26.4 mmol/L (ref 20.0–28.0)
Calcium, Ion: 1.21 mmol/L (ref 1.15–1.40)
HCT: 46 % (ref 36.0–46.0)
Hemoglobin: 15.6 g/dL — ABNORMAL HIGH (ref 12.0–15.0)
O2 Saturation: 99 %
Potassium: 4.4 mmol/L (ref 3.5–5.1)
Sodium: 133 mmol/L — ABNORMAL LOW (ref 135–145)
TCO2: 29 mmol/L (ref 22–32)
pCO2, Ven: 85.7 mmHg (ref 44.0–60.0)
pH, Ven: 7.096 — CL (ref 7.250–7.430)
pO2, Ven: 178 mmHg — ABNORMAL HIGH (ref 32.0–45.0)

## 2020-11-23 LAB — GLUCOSE, CAPILLARY
Glucose-Capillary: 154 mg/dL — ABNORMAL HIGH (ref 70–99)
Glucose-Capillary: 180 mg/dL — ABNORMAL HIGH (ref 70–99)
Glucose-Capillary: 221 mg/dL — ABNORMAL HIGH (ref 70–99)
Glucose-Capillary: 231 mg/dL — ABNORMAL HIGH (ref 70–99)
Glucose-Capillary: 94 mg/dL (ref 70–99)

## 2020-11-23 LAB — PROTEIN, PLEURAL OR PERITONEAL FLUID: Total protein, fluid: 5.3 g/dL

## 2020-11-23 LAB — BRAIN NATRIURETIC PEPTIDE: B Natriuretic Peptide: 101 pg/mL — ABNORMAL HIGH (ref 0.0–100.0)

## 2020-11-23 LAB — PROCALCITONIN: Procalcitonin: 1.76 ng/mL

## 2020-11-23 LAB — SARS CORONAVIRUS 2 BY RT PCR (HOSPITAL ORDER, PERFORMED IN ~~LOC~~ HOSPITAL LAB): SARS Coronavirus 2: NEGATIVE

## 2020-11-23 LAB — HEMOGLOBIN A1C
Hgb A1c MFr Bld: 6.3 % — ABNORMAL HIGH (ref 4.8–5.6)
Mean Plasma Glucose: 134.11 mg/dL

## 2020-11-23 LAB — HIV ANTIBODY (ROUTINE TESTING W REFLEX): HIV Screen 4th Generation wRfx: NONREACTIVE

## 2020-11-23 LAB — PROTEIN, TOTAL: Total Protein: 7 g/dL (ref 6.5–8.1)

## 2020-11-23 LAB — TROPONIN I (HIGH SENSITIVITY)
Troponin I (High Sensitivity): 9 ng/L (ref <18)
Troponin I (High Sensitivity): 9 ng/L (ref <18)

## 2020-11-23 LAB — LACTATE DEHYDROGENASE: LDH: 187 U/L (ref 98–192)

## 2020-11-23 MED ORDER — FENTANYL 2500MCG IN NS 250ML (10MCG/ML) PREMIX INFUSION
0.0000 ug/h | INTRAVENOUS | Status: DC
  Administered 2020-11-23: 25 ug/h via INTRAVENOUS
  Filled 2020-11-23: qty 250

## 2020-11-23 MED ORDER — POLYETHYLENE GLYCOL 3350 17 G PO PACK: 17.0000 g | PACK | Freq: Every day | ORAL | Status: DC | PRN

## 2020-11-23 MED ORDER — VITAL HIGH PROTEIN PO LIQD
1000.0000 mL | ORAL | Status: DC
  Administered 2020-11-23: 1000 mL

## 2020-11-23 MED ORDER — FENTANYL CITRATE (PF) 100 MCG/2ML IJ SOLN
25.0000 ug | Freq: Once | INTRAMUSCULAR | Status: AC
  Administered 2020-11-23: 25 ug via INTRAVENOUS

## 2020-11-23 MED ORDER — ALBUTEROL SULFATE (2.5 MG/3ML) 0.083% IN NEBU
5.0000 mg | INHALATION_SOLUTION | Freq: Once | RESPIRATORY_TRACT | Status: AC
  Administered 2020-11-23: 5 mg via RESPIRATORY_TRACT
  Filled 2020-11-23: qty 6

## 2020-11-23 MED ORDER — FENTANYL BOLUS VIA INFUSION
25.0000 ug | INTRAVENOUS | Status: DC | PRN
  Filled 2020-11-23: qty 25

## 2020-11-23 MED ORDER — POLYETHYLENE GLYCOL 3350 17 G PO PACK
17.0000 g | PACK | Freq: Every day | ORAL | Status: DC
  Administered 2020-11-23 – 2020-11-24 (×2): 17 g
  Filled 2020-11-23 (×2): qty 1

## 2020-11-23 MED ORDER — FENTANYL 2500MCG IN NS 250ML (10MCG/ML) PREMIX INFUSION
25.0000 ug/h | INTRAVENOUS | Status: DC
  Administered 2020-11-23: 200 ug/h via INTRAVENOUS

## 2020-11-23 MED ORDER — PROPOFOL 1000 MG/100ML IV EMUL
INTRAVENOUS | Status: AC
  Administered 2020-11-23: 10 ug/kg/min via INTRAVENOUS
  Filled 2020-11-23: qty 100

## 2020-11-23 MED ORDER — MELATONIN 3 MG PO TABS
3.0000 mg | ORAL_TABLET | Freq: Every evening | ORAL | Status: DC | PRN
  Administered 2020-11-23 – 2020-11-24 (×2): 3 mg via ORAL
  Filled 2020-11-23 (×2): qty 1

## 2020-11-23 MED ORDER — PANTOPRAZOLE SODIUM 40 MG PO TBEC
40.0000 mg | DELAYED_RELEASE_TABLET | Freq: Every day | ORAL | Status: DC
  Administered 2020-11-23: 40 mg via ORAL
  Filled 2020-11-23: qty 1

## 2020-11-23 MED ORDER — SODIUM CHLORIDE 0.9% FLUSH
10.0000 mL | Freq: Three times a day (TID) | INTRAVENOUS | Status: DC
  Administered 2020-11-23 – 2020-11-28 (×14): 10 mL

## 2020-11-23 MED ORDER — PANTOPRAZOLE SODIUM 40 MG PO PACK
40.0000 mg | PACK | Freq: Every day | ORAL | Status: DC
  Filled 2020-11-23: qty 20

## 2020-11-23 MED ORDER — ETOMIDATE 2 MG/ML IV SOLN
INTRAVENOUS | Status: AC | PRN
  Administered 2020-11-23: 20 mg via INTRAVENOUS

## 2020-11-23 MED ORDER — PANTOPRAZOLE SODIUM 40 MG IV SOLR: 40.0000 mg | Freq: Every day | INTRAVENOUS | Status: DC

## 2020-11-23 MED ORDER — PROPOFOL 1000 MG/100ML IV EMUL: 5.0000 ug/kg/min | INTRAVENOUS | Status: DC

## 2020-11-23 MED ORDER — HEPARIN SODIUM (PORCINE) 5000 UNIT/ML IJ SOLN
5000.0000 [IU] | Freq: Three times a day (TID) | INTRAMUSCULAR | Status: DC
  Administered 2020-11-23 – 2020-11-30 (×21): 5000 [IU] via SUBCUTANEOUS
  Filled 2020-11-23 (×21): qty 1

## 2020-11-23 MED ORDER — CHLORHEXIDINE GLUCONATE CLOTH 2 % EX PADS
6.0000 | MEDICATED_PAD | Freq: Every day | CUTANEOUS | Status: DC
  Administered 2020-11-23 – 2020-11-29 (×7): 6 via TOPICAL

## 2020-11-23 MED ORDER — ALBUTEROL SULFATE HFA 108 (90 BASE) MCG/ACT IN AERS: 2.0000 | INHALATION_SPRAY | RESPIRATORY_TRACT | Status: DC | PRN

## 2020-11-23 MED ORDER — SODIUM CHLORIDE 0.9 % IV SOLN: INTRAVENOUS | Status: DC

## 2020-11-23 MED ORDER — DOCUSATE SODIUM 50 MG/5ML PO LIQD
100.0000 mg | Freq: Two times a day (BID) | ORAL | Status: DC
  Administered 2020-11-23 – 2020-11-26 (×6): 100 mg
  Filled 2020-11-23 (×9): qty 10

## 2020-11-23 MED ORDER — SUCCINYLCHOLINE CHLORIDE 20 MG/ML IJ SOLN
INTRAMUSCULAR | Status: AC | PRN
Start: 2020-11-23 — End: 2020-11-23
  Administered 2020-11-23: 100 mg via INTRAVENOUS

## 2020-11-23 MED ORDER — DOCUSATE SODIUM 100 MG PO CAPS
100.0000 mg | ORAL_CAPSULE | Freq: Two times a day (BID) | ORAL | Status: DC | PRN
  Administered 2020-11-23: 100 mg via ORAL
  Filled 2020-11-23: qty 1

## 2020-11-23 MED ORDER — INSULIN ASPART 100 UNIT/ML ~~LOC~~ SOLN
0.0000 [IU] | SUBCUTANEOUS | Status: DC
  Administered 2020-11-23 (×2): 7 [IU] via SUBCUTANEOUS
  Administered 2020-11-23 (×2): 4 [IU] via SUBCUTANEOUS
  Administered 2020-11-24 (×3): 3 [IU] via SUBCUTANEOUS

## 2020-11-23 MED ORDER — ALBUTEROL SULFATE (2.5 MG/3ML) 0.083% IN NEBU
2.5000 mg | INHALATION_SOLUTION | Freq: Four times a day (QID) | RESPIRATORY_TRACT | Status: DC
  Administered 2020-11-23 (×2): 2.5 mg via RESPIRATORY_TRACT
  Filled 2020-11-23 (×2): qty 3

## 2020-11-23 MED ORDER — DEXMEDETOMIDINE HCL IN NACL 400 MCG/100ML IV SOLN
0.0000 ug/kg/h | INTRAVENOUS | Status: DC
  Administered 2020-11-23: 0.4 ug/kg/h via INTRAVENOUS
  Filled 2020-11-23: qty 100

## 2020-11-23 NOTE — Progress Notes (Signed)
PCCM progress note:   S:  65 yo FM, admitted for SOB, Acute hypoxemic respiratory failure x2 weeks, large right sided effusion, pigtail placed. 3500cc out of the fluid out.  Patient seen and evaluated in the ICU upon transfer. Sedated on fentanyl infusion. Following commands. resp status improved but still on life support   O: BP 128/74 (BP Location: Left Arm)   Pulse 81   Temp (!) 89.42 F (31.9 C) (Bladder)   Resp (!) 26   Ht 5\' 6"  (1.676 m)   Wt 103.6 kg   SpO2 95%   BMI 36.86 kg/m   Gen: fm, intubated, on MV  HENT: tracking  Heart: RRR, s1 s2 Lungs: BL vented breaths  Abd: soft, nt  Labs reviewed   Results for SALIHAH, PECKHAM (MRN [id number]) as of 11/23/2020 17:03  Ref. Range 11/23/2020 07:49  Fluid Type-FLDH Unknown PLEU RIGHT  LD, Fluid Latest Ref Range: 3 - 23 U/L 1,523 (H)  Total protein, fluid Latest Units: g/dL 5.3  Fluid Type-FCT Unknown PLEU RIGHT  Fluid Type-FTP Unknown PLEU RIGHT  Color, Fluid Latest Ref Range: YELLOW  RED (A)  Total Nucleated Cell Count, Fluid Latest Ref Range: 0 - 1,000 cu mm 2,280 (H)  Lymphs, Fluid Latest Units: % 25  Eos, Fluid Latest Units: % 0  Appearance, Fluid Latest Ref Range: CLEAR  CLOUDY (A)  Neutrophil Count, Fluid Latest Ref Range: 0 - 25 % 67 (H)  Monocyte-Macrophage-Serous Fluid Latest Ref Range: 50 - 90 % 8 (L)   A: Exudative effusion, unilateral, large volume Acute hypoxemic respiratory failure on MV  Compressive atelectasis secondary to above   P: Drainage of fluid Pigtail was placed  Await cytology  Repeat CT chest ordered for evaluation of lung following re-expansion  Need to r/o underlying malignancy   Hold sedation SBT SAT Possible extubation   This patient is critically ill with multiple organ system failure; which, requires frequent high complexity decision making, assessment, support, evaluation, and titration of therapies. This was completed through the application of advanced monitoring technologies and  extensive interpretation of multiple databases. During this encounter critical care time was devoted to patient care services described in this note for 31 minutes.  Garner Nash, DO [facility] 11/23/2020 5:08 PM

## 2020-11-23 NOTE — Progress Notes (Signed)
Pt extubated and placed on 4L nasal cannula. BBS heard throughout. RN and family are at bedside.

## 2020-11-23 NOTE — ED Provider Notes (Signed)
[facility] EMERGENCY DEPARTMENT Provider Note   CSN: [id number] Arrival date & time: 11/23/20  0250   History Chief Complaint  Patient presents with  . Respiratory Distress    Yvette Roberts is a 65 y.o. female.  The history is provided by the EMS personnel. The history is limited by the condition of the patient (Altered mental status).  She was brought in by EMS because of difficulty breathing for the last 2 weeks which apparently got worse today. EMS relates that she was talking to them when they got there but deteriorated in route. She was hypoxic, but oxygen saturation did come up with placing her on a nonrebreather mask. She was given albuterol nebulizer and methylprednisolone in route. Patient is not able to give any history whatsoever.  Past Medical History:  Diagnosis Date  . Allergy   . Arthritis   . Depression   . Esophageal reflux 07/21/2014  . Frozen shoulder 05/10/2015   Injected 05/10/2015   . Obesity, unspecified 07/21/2014  . Resistant hypertension 07/21/2014  . Type 2 diabetes mellitus without complication ([place]) 7/41/6384    Patient Active Problem List   Diagnosis Date Noted  . Trigger point of right shoulder region 03/09/2019  . Cervical radiculopathy at C8 03/09/2019  . Unilateral primary osteoarthritis, left knee 11/17/2017  . Unilateral primary osteoarthritis, right knee 11/17/2017  . Degenerative arthritis of knee, bilateral 07/14/2017  . Baker cyst, left 06/16/2017  . Pain and swelling of left lower extremity 05/26/2017  . Degenerative joint disease of knee, left 05/26/2017  . Hyperlipemia 06/12/2016  . MDD (major depressive disorder) 06/12/2016  . Urticaria 06/12/2016  . Type 2 diabetes mellitus without complication ([place]) 53/64/6803  . Obesity 07/21/2014  . Resistant hypertension 07/21/2014  . Esophageal reflux 07/21/2014    Past Surgical History:  Procedure Laterality Date  . LAPAROSCOPIC ABDOMINAL EXPLORATION    . miscarr        OB History   No obstetric history on file.     Family History  Problem Relation Age of Onset  . Breast cancer Sister        ? age of onset    Social History   Tobacco Use  . Smoking status: Never Smoker  . Smokeless tobacco: Never Used    Home Medications Prior to Admission medications   Medication Sig Start Date End Date Taking? Authorizing Provider  Accu-Chek FastClix Lancets MISC Use as directed 04/14/19   Colin Benton R, DO  amLODipine (NORVASC) 10 MG tablet TAKE 1 TABLET(10 MG) BY MOUTH DAILY 04/27/20   [name], DO  aspirin 81 MG tablet Take 81 mg by mouth daily.    [provider]  carvedilol (COREG) 12.5 MG tablet TAKE 1 TABLET(12.5 MG) BY MOUTH TWICE DAILY WITH A MEAL 04/07/20   Yu, Amy V, PA-C  cholecalciferol (VITAMIN D) 1000 UNITS tablet Take 4,000 Units by mouth daily.     [provider]  famotidine (PEPCID) 20 MG tablet Take 1 tablet (20 mg total) by mouth 2 (two) times daily as needed for heartburn or indigestion. 11/15/20   Wieters, Hallie C, PA-C  FLUoxetine (PROZAC) 40 MG capsule TAKE 1 CAPSULE(40 MG) BY MOUTH DAILY 08/21/20   [name], DO  gabapentin (NEURONTIN) 100 MG capsule Take 2 capsules (200 mg total) by mouth at bedtime. 03/09/19   Lyndal Pulley, DO  glucose blood (ACCU-CHEK GUIDE) test strip Use as directed once a day 04/14/19   [name],  DO  losartan (COZAAR) 100 MG tablet TAKE 1 TABLET(100 MG) BY MOUTH DAILY 06/20/20   Colin Benton R, DO  magnesium hydroxide (MILK OF MAGNESIA) 400 MG/5ML suspension Take 15 mLs by mouth at bedtime as needed. 11/15/20   Wieters, Hallie C, PA-C  MELATONIN PO Take 1 mg by mouth at bedtime.    [provider]  meloxicam (MOBIC) 7.5 MG tablet Take 1 tablet (7.5 mg total) by mouth daily. 04/07/20   Tasia Catchings, Amy V, PA-C  omeprazole (PRILOSEC) 40 MG capsule TAKE 1 CAPSULE(40 MG) BY MOUTH DAILY 03/20/20   [name], DO  spironolactone (ALDACTONE) 25 MG tablet Take 25 mg by mouth 2 (two)  times daily. 02/02/16   [provider]  VITAMIN D PO Take 5,000 Units by mouth daily.    [provider]    Allergies    Eggs or egg-derived products, Influenza vaccines, Peanut-containing drug products, Penicillins, and Sulfa antibiotics  Review of Systems   Review of Systems  Unable to perform [name]: Mental status change    Physical Exam Updated Vital Signs BP (!) 182/86   Pulse (!) 30   Temp (!) 96.2 F (35.7 C) (Temporal)   Resp (!) 29   Ht 5\' 6"  (1.676 m)   Wt 111 kg   SpO2 91%   BMI 39.50 kg/m   Physical Exam Vitals and nursing note reviewed.   65 year old female with preagonal respirations. Vital signs are significant for elevated respiratory rate and blood pressure. Oxygen saturation is 91%, which is borderline hypoxic, but only maintained at this level with 100% oxygen via nonrebreather mask. Head is normocephalic and atraumatic. PERRLA, EOMI. Oropharynx is clear. Neck is supple without adenopathy. JVD is present. Lungs have symmetric air movement bilaterally without any rales or wheezes. Chest is nontender. Heart has regular rate and rhythm without murmur. Abdomen is soft, flat. Extremities have 2+ edema. Skin is warm and mildly diaphoretic without rash. Neurologic: Minimally responsive, nonverbal and does not follow commands.  ED Results / Procedures / Treatments   Labs (all labs ordered are listed, but only abnormal results are displayed) Labs Reviewed  SARS CORONAVIRUS 2 BY RT PCR (HOSPITAL ORDER, [place] [facility])  CBC WITH DIFFERENTIAL/PLATELET  BRAIN NATRIURETIC PEPTIDE  BASIC METABOLIC PANEL  MAGNESIUM  HEPATIC FUNCTION PANEL  I-STAT ARTERIAL BLOOD GAS, ED  TROPONIN I (HIGH SENSITIVITY)    EKG EKG Interpretation  Date/Time:  Thursday November 23 2020 02:56:33 EST Ventricular Rate:  101 PR Interval:    QRS Duration: 94 QT Interval:  414 QTC Calculation: 537 R Axis:   46 Text Interpretation: Sinus  tachycardia Atrial premature complex Anteroseptal infarct, old Prolonged QT interval When compared with ECG of 11/14/1997, QT has lengthened Anteroseptal infarct , old is now present Confirmed by Delora Fuel ([id number]) on 11/23/2020 3:31:53 AM   Radiology CT Head Wo Contrast  Result Date: 11/23/2020 CLINICAL DATA:  Altered mental status EXAM: CT HEAD WITHOUT CONTRAST TECHNIQUE: Contiguous axial images were obtained from the base of the skull through the vertex without intravenous contrast. COMPARISON:  None. FINDINGS: Brain: Normal anatomic configuration. No abnormal intra or extra-axial mass lesion or fluid collection. No abnormal mass effect or midline shift. No evidence of acute intracranial hemorrhage or infarct. Ventricular size is normal. Cerebellum unremarkable. Vascular: Unremarkable Skull: Intact Sinuses/Orbits: Paranasal sinuses are clear. Orbits are unremarkable. Other: Mastoid air cells and middle ear cavities are clear. IMPRESSION: No acute intracranial abnormality.  Normal exam. Electronically Signed  By: Fidela Salisbury MD   On: 11/23/2020 05:31   CT Chest Wo Contrast  Result Date: 11/23/2020 CLINICAL DATA:  Abnormal chest x-ray, hypoxia EXAM: CT CHEST WITHOUT CONTRAST TECHNIQUE: Multidetector CT imaging of the chest was performed following the standard protocol without IV contrast. COMPARISON:  None. FINDINGS: Cardiovascular: Cardiac size within normal limits. No significant coronary artery calcification. No pericardial effusion. Central pulmonary arteries are of normal caliber. The thoracic aorta is unremarkable. Mediastinum/Nodes: There is mediastinal shift to the left secondary to the large right pleural effusion. Thyroid unremarkable. No pathologic mediastinal adenopathy. Nasogastric tube extends into the abdomen and is a seen looped within the visualized stomach. Lungs/Pleura: The right hemithorax is not fully included on this examination and the inferior pleural space is excluded.  However, a large right pleural effusion is present with near complete collapse of the right lung. The pleural fluid is relatively low attenuation suggesting a transudative effusion. No definite central obstructing mass is identified though imaging of the decompressed pulmonary parenchyma is limited in absence of contrast administration. Endotracheal tube seen within the trachea approximately 2.8 cm above the carina. Mild left basilar atelectasis. No pneumothorax. Upper Abdomen: No acute abnormality. Musculoskeletal: Osseous structures are age-appropriate. No lytic or blastic bone lesions are identified. IMPRESSION: Large right pleural effusion with near complete collapse of the right lung and mediastinal shift to the left. Electronically Signed   By: Fidela Salisbury MD   On: 11/23/2020 05:39   DG Chest Portable 1 View  Result Date: 11/23/2020 CLINICAL DATA:  Respiratory failure, endotracheal intubation EXAM: PORTABLE CHEST 1 VIEW COMPARISON:  11/23/2020 FINDINGS: Endotracheal tube is seen within the right mainstem bronchus roughly 2 cm beyond the carina. Nasogastric tube is seen looped within the gastric fundus. There is improved aeration of the right lung, likely related to preferential right lung aeration, which is remains largely collapsed. A large right pleural effusion is again seen. Left lung is clear and there is improved insufflation of the left lung. No pneumothorax or pleural effusion on the left. Cardiac size within normal limits. Pulmonary vascularity is normal. IMPRESSION: Right mainstem intubation. Withdrawal of the endotracheal tube by 4.5-5 cm would more optimally position the catheter within the trachea. Large right pleural effusion with near complete collapse of the right lung. These results were called by telephone at the time of interpretation on 11/23/2020 at 3:36 am to provider Bain Whichard [facility] , who verbally acknowledged these results. Electronically Signed   By: Fidela Salisbury MD   On:  11/23/2020 03:36   DG Chest Portable 1 View  Result Date: 11/23/2020 CLINICAL DATA:  Shortness of breath for 2 weeks, worse today. EXAM: PORTABLE CHEST 1 VIEW COMPARISON:  None. FINDINGS: Shallow inspiration. Complete opacification of the right hemithorax. This could be due to either or combination of consolidation, atelectasis, and/or effusion. Focal infiltration in the left lung base behind the heart. Heart size is obscured by the parenchymal process. No pneumothorax. IMPRESSION: Complete opacification of the right hemithorax. Focal infiltration in the left lung base behind the heart. Electronically Signed   By: Lucienne Capers M.D.   On: 11/23/2020 03:14    Procedures Procedure Name: Intubation Date/Time: 11/23/2020 3:34 AM Performed by: Delora Fuel, MD Pre-anesthesia Checklist: Patient identified, Patient being monitored, Emergency Drugs available, Timeout performed and Suction available Oxygen Delivery Method: Non-rebreather mask Preoxygenation: Pre-oxygenation with 100% oxygen Induction Type: Rapid sequence Ventilation: Mask ventilation without difficulty Laryngoscope Size: Glidescope and 3 Grade View: Grade I Tube size: 7.5 mm Number  of attempts: 1 Airway Equipment and Method: Rigid stylet and Video-laryngoscopy Placement Confirmation: ETT inserted through vocal cords under direct vision,  CO2 detector and Breath sounds checked- equal and bilateral Secured at: 26 cm Tube secured with: ETT holder Dental Injury: Teeth and Oropharynx as per pre-operative assessment       CRITICAL CARE Performed by: Delora Fuel Total critical care time: 85 minutes Critical care time was exclusive of separately billable procedures and treating other patients. Critical care was necessary to treat or prevent imminent or life-threatening deterioration. Critical care was time spent personally by me on the following activities: development of treatment plan with patient and/or surrogate as well as  nursing, discussions with consultants, evaluation of patient's response to treatment, examination of patient, obtaining history from patient or surrogate, ordering and performing treatments and interventions, ordering and review of laboratory studies, ordering and review of radiographic studies, pulse oximetry and re-evaluation of patient's condition.  Medications Ordered in ED Medications  albuterol (VENTOLIN HFA) 108 (90 Base) MCG/ACT inhaler 2 puff (has no administration in time range)  etomidate (AMIDATE) injection (20 mg Intravenous Given 11/23/20 0316)  succinylcholine (ANECTINE) injection (100 mg Intravenous Given 11/23/20 0316)  propofol (DIPRIVAN) 1000 MG/100ML infusion (has no administration in time range)    ED Course  I have reviewed the triage vital signs and the nursing notes.  Pertinent labs & imaging results that were available during my care of the patient were reviewed by me and considered in my medical decision making (see chart for details).  MDM Rules/Calculators/A&P Acute respiratory failure with hypoxia. Patient appeared to be tiring and was intubated electively. Altered mental status may be related to respiratory failure, but initial blood pressures were very high. Will send for CT of head to rule out hemorrhage. Chest x-ray showed opacification of right lung. Post intubation x-ray showed ET tube in the right mainstem bronchus with an ET tube is pulled back. I discussed radiology findings with radiologist who feels that she has a large right pleural effusion. Will send for CT of chest to further evaluate this. Old records are reviewed, and she has no relevant past visits. ECG shows slightly prolonged QT interval, will check magnesium level and supplement with magnesium if necessary.  Magnesium level is actually slightly elevated.  CT of head shows no acute process.  CT of chest shows large right pleural effusion.  Blood pressure has come down while on propofol and actually has  gotten too low.  Propofol was stopped and fentanyl was used for sedation.  Case is discussed with Dr. Carson Myrtle of critical care service who agrees to admit the patient.  Final Clinical Impression(s) / ED Diagnoses Final diagnoses:  Acute respiratory failure with hypoxemia (HCC)  Recurrent right pleural effusion  Acute kidney injury (nontraumatic) ([place])  Hyponatremia    Rx / DC Orders ED Discharge Orders    None       Delora Fuel, MD 16/60/63 [phone]

## 2020-11-23 NOTE — Progress Notes (Signed)
RT transported patient to CT and back to 016C without event.

## 2020-11-23 NOTE — Progress Notes (Incomplete)
NAME:  Yvette Roberts, MRN:  [id number], DOB:  [birth year], LOS: 0 ADMISSION DATE:  11/23/2020, CONSULTATION DATE:  *** REFERRING MD:  ***, CHIEF COMPLAINT:  ***   Brief History:  ***  History of Present Illness:  This 65 y.o. morbidly obese African-American female presented to [place] emergency department via EMS with complaints of shortness of breath.  According to EMS report, the patient was initially awake, alert and conversant upon their arrival; however, on route to the emergency department, the patient's dyspnea worsened.  She had increasing oxygen requirement, which failed to improve with application of a nonrebreather mask.  She was given albuterol and Solu-Medrol.  In the emergency department she was urgently intubated.  At the time of clinical interview, the patient is still under the effects of RSI medications administered for endotracheal intubation.  Of note, initial chest x-ray revealed opacification of the right hemithorax.  Chest CT suggests complete right lung atelectasis secondary to compression from a large right-sided pleural effusion.    Initial ABG after endotracheal intubation: 7.32/46/283 on FiO2 100%.  Serum sodium 132.  BUN 21, creatinine 1.3.  Troponin-I 9.  BNP 101.  Past Medical History:   Past Medical History:  Diagnosis Date  . Allergy   . Arthritis   . Depression   . Esophageal reflux 07/21/2014  . Frozen shoulder 05/10/2015   Injected 05/10/2015   . Obesity, unspecified 07/21/2014  . Resistant hypertension 07/21/2014  . Type 2 diabetes mellitus without complication ([place]) 07/18/1940   Significant Hospital Events:  1/27 Admitted for SOB/dyspnea, intubated in ED, massive R pleural effusion, chest tube placed  Consults:    Procedures:  ETT 1/27 >> R Chest Tube 1/27 >>  Significant Diagnostic Tests:   1/27 CXR >> Complete opacification of R hemithorax  1/27 CT Chest >> Large R pleural effusion with near complete collapse of R  lung, mediastinal shift to L  1/27 CT Head >> NAICA  Micro Data:  COVID 1/27 >> negative BCx2 1/27 >> Pleural Fluid Cx 1/27 >>  Antimicrobials:  None  Interim History / Subjective:  ***  Objective   Blood pressure (!) 80/52, pulse (!) 59, temperature (!) 96.62 F (35.9 C), temperature source Bladder, resp. rate 18, height 5\' 6"  (1.676 m), weight 103.6 kg, SpO2 97 %.    Vent Mode: PRVC FiO2 (%):  [40 %-100 %] 50 % Set Rate:  [18 bmp] 18 bmp Vt Set:  [470 mL] 470 mL PEEP:  [5 cmH20] 5 cmH20 Plateau Pressure:  [22 cmH20] 22 cmH20   Intake/Output Summary (Last 24 hours) at 11/23/2020 1046 Last data filed at 11/23/2020 1000 Gross per 24 hour  Intake 570.45 ml  Output 3550 ml  Net -2979.55 ml   Filed Weights   11/23/20 0257 11/23/20 0322 11/23/20 0826  Weight: 106.6 kg 111 kg 103.6 kg   Examination: General: HEENT:  Neuro: CV:  PULM:  GI:  Extremities:  Skin:  Resolved Hospital Problem list   ***  Assessment & Plan:  ***  Best practice (evaluated daily)  Diet: *** Pain/Anxiety/Delirium protocol (if indicated): *** VAP protocol (if indicated): *** DVT prophylaxis: *** GI prophylaxis: *** Glucose control: *** Mobility: *** Disposition:***  Goals of Care:  Last date of multidisciplinary goals of care discussion:*** Family and staff present: *** Summary of discussion: *** Follow up goals of care discussion due: *** Code Status: ***  Labs   CBC: Recent Labs  Lab 11/23/20 0324 11/23/20 0421 11/23/20 0525  WBC 16.5*  --   --   NEUTROABS 10.7*  --   --   HGB 14.4 14.3 15.6*  HCT 45.3 42.0 46.0  MCV 86.0  --   --   PLT 474*  --   --     Basic Metabolic Panel: Recent Labs  Lab 11/23/20 0324 11/23/20 0421 11/23/20 0525  NA 132* 131* 133*  K 4.5 4.0 4.4  CL 96*  --   --   CO2 22  --   --   GLUCOSE 212*  --   --   BUN 21  --   --   CREATININE 1.28*  --   --   CALCIUM 9.5  --   --   MG 2.6*  --   --    GFR: Estimated Creatinine  Clearance: 53.3 mL/min (A) (by C-G formula based on SCr of 1.28 mg/dL (H)). Recent Labs  Lab 11/23/20 0324  WBC 16.5*    Liver Function Tests: Recent Labs  Lab 11/23/20 0324 11/23/20 0849  AST 23  --   ALT 23  --   ALKPHOS 63  --   BILITOT 0.9  --   PROT 8.2* 7.0  ALBUMIN 3.4*  --    No results for input(s): LIPASE, AMYLASE in the last 168 hours. No results for input(s): AMMONIA in the last 168 hours.  ABG    Component Value Date/Time   PHART 7.324 (L) 11/23/2020 0421   PCO2ART 46.0 11/23/2020 0421   PO2ART 283 (H) 11/23/2020 0421   HCO3 26.4 11/23/2020 0525   TCO2 29 11/23/2020 0525   ACIDBASEDEF 6.0 (H) 11/23/2020 0525   O2SAT 99.0 11/23/2020 0525     Coagulation Profile: No results for input(s): INR, PROTIME in the last 168 hours.  Cardiac Enzymes: No results for input(s): CKTOTAL, CKMB, CKMBINDEX, TROPONINI in the last 168 hours.  HbA1C: Hgb A1c MFr Bld  Date/Time Value Ref Range Status  11/23/2020 08:49 AM 6.3 (H) 4.8 - 5.6 % Final    Comment:    (NOTE) Pre diabetes:          5.7%-6.4%  Diabetes:              >6.4%  Glycemic control for   <7.0% adults with diabetes   04/20/2019 08:09 AM 6.0 4.6 - 6.5 % Final    Comment:    Glycemic Control Guidelines for People with Diabetes:Non Diabetic:  <6%Goal of Therapy: <7%Additional Action Suggested:  >8%     CBG: Recent Labs  Lab 11/23/20 0851  GLUCAP 180*    Critical care time: ***    Rhae Lerner [facility] 11/23/20 10:46 AM  Please see [email or web address] for pager details.

## 2020-11-23 NOTE — Progress Notes (Signed)
Initial Nutrition Assessment  DOCUMENTATION CODES:   Obesity unspecified  INTERVENTION:   Initiate tube feeding via OGT: Vital High Protein at 60 ml/h (1440 ml per day)  Provides 1440 kcal, 126 gm protein, 1204 ml free water daily  NUTRITION DIAGNOSIS:   Inadequate oral intake related to inability to eat as evidenced by NPO status.  GOAL:   Provide needs based on ASPEN/SCCM guidelines  MONITOR:   Vent status,Labs,TF tolerance  REASON FOR ASSESSMENT:   Ventilator,Consult Enteral/tube feeding initiation and management  ASSESSMENT:   65 yo female admitted with SOB, pleural effusion, urgently intubated in the ED. PMH includes GERD, obesity, HTN, DM-2.   Discussed patient in ICU rounds and with RN today. Chest tube placed this morning and 2,100 ml output recorded.   Received MD Consult for TF initiation and management. OG tube in place.  Patient is currently intubated on ventilator support MV: 10.2 L/min Temp (24hrs), Avg:96.5 F (35.8 C), Min:84.56 F (29.2 C), Max:99 F (37.2 C)    Labs reviewed. Na 133, Mag 2.6 CBG: 180  Medications reviewed and include Colace, Novolog, Miralax, Precedex.  Weight history reviewed. No recent weights within the past year available for review.   NUTRITION - FOCUSED PHYSICAL EXAM:  Flowsheet Row Most Recent Value  Orbital Region No depletion  Upper Arm Region No depletion  Thoracic and Lumbar Region No depletion  Buccal Region No depletion  Temple Region No depletion  Clavicle Bone Region No depletion  Clavicle and Acromion Bone Region No depletion  Scapular Bone Region Unable to assess  Dorsal Hand No depletion  Patellar Region No depletion  Anterior Thigh Region No depletion  Posterior Calf Region No depletion  Edema (RD Assessment) Mild  Hair Reviewed  Eyes Unable to assess  Mouth Unable to assess  Skin Reviewed  Nails Reviewed       Diet Order:   Diet Order            Diet NPO time specified  Diet  effective now                 EDUCATION NEEDS:   Not appropriate for education at this time  Skin:  Skin Assessment: Reviewed RN Assessment  Last BM:  no BM documented  Height:   Ht Readings from Last 1 Encounters:  11/23/20 5\' 6"  (1.676 m)    Weight:   Wt Readings from Last 1 Encounters:  11/23/20 103.6 kg    Ideal Body Weight:  59.1 kg  BMI:  Body mass index is 36.86 kg/m.  Estimated Nutritional Needs:   Kcal:  1140-1450  Protein:  118 gm  Fluid:  >/= 1.8 L    Lucas Mallow, RD, LDN, CNSC Please refer to Amion for contact information.

## 2020-11-23 NOTE — H&P (Signed)
NAME:  Yvette Roberts MRN:  [id number] DOB:  11/22/1955 LOS: 0 ADMISSION DATE:  11/23/2020 DATE OF SERVICE:  11/23/2020  CHIEF COMPLAINT:  dyspnea   HISTORY & PHYSICAL  History of Present Illness  This 65 y.o. morbidly obese African-American female presented to [employer] emergency department via EMS with complaints of shortness of breath.  According to EMS report, the patient was initially awake, alert and conversant upon their arrival; however, on route to the emergency department, the patient's dyspnea worsened.  She had increasing oxygen requirement, which failed to improve with application of a nonrebreather mask.  She was given albuterol and Solu-Medrol.  In the emergency department she was urgently intubated.  At the time of clinical interview, the patient is still under the effects of RSI medications administered for endotracheal intubation.  Of note, initial chest x-ray revealed opacification of the right hemithorax.  Chest CT suggests complete right lung atelectasis secondary to compression from a large right-sided pleural effusion.    Initial ABG after endotracheal intubation: 7.32/46/283 on FiO2 100%.  Serum sodium 132.  BUN 21, creatinine 1.3.  Troponin-I 9.  BNP 101.   REVIEW OF SYSTEMS This patient is critically ill and cannot provide additional history nor review of systems due to mental status/unconsciousness and endotracheally intubated.   Past Medical/Surgical/Social/Family History   Past Medical History:  Diagnosis Date  . Allergy   . Arthritis   . Depression   . Esophageal reflux 07/21/2014  . Frozen shoulder 05/10/2015   Injected 05/10/2015   . Obesity, unspecified 07/21/2014  . Resistant hypertension 07/21/2014  . Type 2 diabetes mellitus without complication ([place]) 0/07/9322    Past Surgical History:  Procedure Laterality Date  . LAPAROSCOPIC ABDOMINAL EXPLORATION    . miscarr      Social History   Tobacco Use  . Smoking status: Never  Smoker  . Smokeless tobacco: Never Used  Substance Use Topics  . Alcohol use: Not on file    Family History  Problem Relation Age of Onset  . Breast cancer Sister        ? age of onset    Procedures:  1/27: Under tracheal intubation in the emergency department.     Significant Diagnostic Tests:  1/27: Head CT negative for acute intracranial process.  Chest CT confirms right lower lung compressive atelectasis secondary to huge right-sided pleural effusion.   Micro Data:   Results for orders placed or performed during the hospital encounter of 11/23/20  SARS Coronavirus 2 by RT PCR (hospital order, performed in [facility] hospital lab) Nasopharyngeal Nasopharyngeal Swab     Status: None   Collection Time: 11/23/20  3:06 AM   Specimen: Nasopharyngeal Swab  Result Value Ref Range Status   SARS Coronavirus 2 NEGATIVE NEGATIVE Final    Comment: (NOTE) SARS-CoV-2 target nucleic acids are NOT DETECTED.  The SARS-CoV-2 RNA is generally detectable in upper and lower respiratory specimens during the acute phase of infection. The lowest concentration of SARS-CoV-2 viral copies this assay can detect is 250 copies / mL. A negative result does not preclude SARS-CoV-2 infection and should not be used as the sole basis for treatment or other patient management decisions.  A negative result may occur with improper specimen collection / handling, submission of specimen other than nasopharyngeal swab, presence of viral mutation(s) within the areas targeted by this assay, and inadequate number of viral copies (<250 copies / mL). A negative result must be combined with clinical observations,  patient history, and epidemiological information.  Fact Sheet for Patients:   StrictlyIdeas.no  Fact Sheet for Healthcare Providers: BankingDealers.co.za  This test is not yet approved or  cleared by the [place] FDA and has been authorized for  detection and/or diagnosis of SARS-CoV-2 by FDA under an Emergency Use Authorization (EUA).  This EUA will remain in effect (meaning this test can be used) for the duration of the COVID-19 declaration under Section 564(b)(1) of the Act, 21 U.S.C. section 360bbb-3(b)(1), unless the authorization is terminated or revoked sooner.  Performed at [facility], [place] [address], [place], [place] [address]       Antimicrobials:  N/A   Interim history/subjective:  N/A   Objective   BP 126/84   Pulse 80   Temp 98 F (36.7 C)   Resp 18   Ht 5\' 6"  (1.676 m)   Wt 111 kg   SpO2 95%   BMI 39.50 kg/m     Filed Weights   11/23/20 0257 11/23/20 0322  Weight: 106.6 kg 111 kg   No intake or output data in the 24 hours ending 11/23/20 0505  Vent Mode: PRVC FiO2 (%):  [60 %-100 %] 60 % Set Rate:  [18 bmp] 18 bmp Vt Set:  [470 mL] 470 mL PEEP:  [5 cmH20] 5 cmH20   Examination: GENERAL: Intubated.  Still under the effects of RSI medications. No acute distress. HEAD: normocephalic, atraumatic EYE: Pupils are sluggish, symmetric.  No scleral icterus, no pallor. THROAT/ORAL CAVITY: ETT in situ.  NECK: Short, squat neck.  Supple, no thyromegaly, no JVD, no lymphadenopathy. Trachea midline. CHEST/LUNG: Decreased breath sounds in the right hemithorax with tubular breath sounds in the right midlung field.  Left lung is clear.  No crackles. No wheezes. HEART: Regular S1 and S2 without murmur, rub or gallop. ABDOMEN: soft, nontender, nondistended. Normoactive bowel sounds. No rebound. No guarding. No hepatosplenomegaly. EXTREMITIES: Edema: Trace. No cyanosis.  No clubbing. 2+ DP pulses LYMPHATIC: no cervical/axillary/inguinal lymph nodes appreciated MUSCULOSKELETAL: No point tenderness.  No bulk atrophy. Joints: Normal inspection.  SKIN: No rash or lesion.   Resolved Hospital Problem list   N/A   Assessment & Plan:   ASSESSMENT/PLAN:  ASSESSMENT (included in the Hospital Problem  List)  Principal Problem:   Acute hypoxemic respiratory failure (HCC) Active Problems:   Pleural effusion, right   Atelectasis of right lung   Morbid obesity (HCC)   Type 2 diabetes mellitus without complication ([place])   By systems: PULMONARY  Acute hypoxemic respiratory failure  Large right pleural effusion  Compressive atelectasis of the right lung Titrate vent settings based on ABG results.   Given the patient's intubated state and the size of the pleural effusion, she would probably benefit most from chest tube insertion. Pleural fluid should be sent for analysis. Repeat imaging should be performed after successful reexpansion of the right lung.  High suspicion for malignancy.   CARDIOVASCULAR  No acute issues   RENAL  No acute issues, although creatinine is at the upper limit of normal Monitor urine output Avoid nephrotoxic drugs   GASTROINTESTINAL  History of GERD  GI PROPHYLAXIS: Protonix    HEMATOLOGIC  Leukocytosis, after steroid exposure  DVT PROPHYLAXIS: Heparin   INFECTIOUS  No acute issues Tracheal aspirate for Gram stain, C/S Blood cultures   ENDOCRINE  Type 2 diabetes  Morbid obesity Sliding scale insulin for now   NEUROLOGIC  Altered mental status Titrate sedation for RASS -2 while on ventilatory support.  PLAN/RECOMMENDATIONS   Admit to ICU under my service (Attending: Renee Pain, MD) with the diagnoses highlighted above in the active Hospital Problem List (ASSESSMENT).  See above.  NG tube placement.  Start tube feeds.    My assessment, plan of care, findings, medications, side effects, etc. were discussed with: nurse.   Best practice:  Diet: Start tube feeds Pain/Anxiety/Delirium protocol (if indicated): Yes VAP protocol (if indicated): Yes DVT prophylaxis: Heparin GI prophylaxis: Protonix Glucose control: Sliding scale insulin Mobility/Activity: Bedrest   Code Status: Full Code Family Communication:  No family  at the bedside. Disposition: Admit to ICU   Labs   CBC: Recent Labs  Lab 11/23/20 0324 11/23/20 0421  WBC 16.5*  --   NEUTROABS 10.7*  --   HGB 14.4 14.3  HCT 45.3 42.0  MCV 86.0  --   PLT 474*  --     Basic Metabolic Panel: Recent Labs  Lab 11/23/20 0324 11/23/20 0421  NA 132* 131*  K 4.5 4.0  CL 96*  --   CO2 22  --   GLUCOSE 212*  --   BUN 21  --   CREATININE 1.28*  --   CALCIUM 9.5  --   MG 2.6*  --    GFR: Estimated Creatinine Clearance: 55.3 mL/min (A) (by C-G formula based on SCr of 1.28 mg/dL (H)). Recent Labs  Lab 11/23/20 0324  WBC 16.5*    Liver Function Tests: Recent Labs  Lab 11/23/20 0324  AST 23  ALT 23  ALKPHOS 63  BILITOT 0.9  PROT 8.2*  ALBUMIN 3.4*   No results for input(s): LIPASE, AMYLASE in the last 168 hours. No results for input(s): AMMONIA in the last 168 hours.  ABG    Component Value Date/Time   PHART 7.324 (L) 11/23/2020 0421   PCO2ART 46.0 11/23/2020 0421   PO2ART 283 (H) 11/23/2020 0421   HCO3 23.8 11/23/2020 0421   TCO2 25 11/23/2020 0421   ACIDBASEDEF 2.0 11/23/2020 0421   O2SAT 100.0 11/23/2020 0421     Coagulation Profile: No results for input(s): INR, PROTIME in the last 168 hours.  Cardiac Enzymes: No results for input(s): CKTOTAL, CKMB, CKMBINDEX, TROPONINI in the last 168 hours.  HbA1C: Hgb A1c MFr Bld  Date/Time Value Ref Range Status  04/20/2019 08:09 AM 6.0 4.6 - 6.5 % Final    Comment:    Glycemic Control Guidelines for People with Diabetes:Non Diabetic:  <6%Goal of Therapy: <7%Additional Action Suggested:  >8%   12/10/2018 08:26 AM 6.2 4.6 - 6.5 % Final    Comment:    Glycemic Control Guidelines for People with Diabetes:Non Diabetic:  <6%Goal of Therapy: <7%Additional Action Suggested:  >8%     CBG: No results for input(s): GLUCAP in the last 168 hours.   Past Medical History   Past Medical History:  Diagnosis Date  . Allergy   . Arthritis   . Depression   . Esophageal reflux  07/21/2014  . Frozen shoulder 05/10/2015   Injected 05/10/2015   . Obesity, unspecified 07/21/2014  . Resistant hypertension 07/21/2014  . Type 2 diabetes mellitus without complication ([place]) 1/69/6789      Surgical History    Past Surgical History:  Procedure Laterality Date  . LAPAROSCOPIC ABDOMINAL EXPLORATION    . miscarr        Social History   Social History   Socioeconomic History  . Marital status: Divorced    Spouse name: Not on file  . Number of children: Not  on file  . Years of education: Not on file  . Highest education level: Not on file  Occupational History  . Not on file  Tobacco Use  . Smoking status: Never Smoker  . Smokeless tobacco: Never Used  Substance and Sexual Activity  . Alcohol use: Not on file  . Drug use: Not on file  . Sexual activity: Not on file  Other Topics Concern  . 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 Breast cancer Sister        ? age of onset   family history includes Breast cancer in her sister.    Allergies Allergies  Allergen Reactions  . Eggs Or Egg-Derived Products Hives  . Influenza Vaccines Hives  . Peanut-Containing Drug Products Hives  . Penicillins Hives  . Sulfa Antibiotics Hives      Current Medications  Current Facility-Administered Medications:  .  fentaNYL 2559mcg in NS 271mL (82mcg/ml) infusion-PREMIX, 0-400 mcg/hr, Intravenous, Continuous, Delora Fuel, MD  Current Outpatient Medications:  .  Accu-Chek FastClix  Lancets MISC, Use as directed, Disp: 100 each, Rfl: 3 .  amLODipine (NORVASC) 10 MG tablet, TAKE 1 TABLET(10 MG) BY MOUTH DAILY, Disp: 30 tablet, Rfl: 0 .  aspirin 81 MG tablet, Take 81 mg by mouth daily., Disp: , Rfl:  .  carvedilol (COREG) 12.5 MG tablet, TAKE 1 TABLET(12.5 MG) BY MOUTH TWICE DAILY WITH A MEAL, Disp: 180 tablet, Rfl: 0 .  cholecalciferol (VITAMIN D) 1000 UNITS tablet, Take 4,000 Units by mouth daily. , Disp: , Rfl:  .  famotidine (PEPCID) 20 MG tablet, Take 1 tablet (20 mg total) by mouth 2 (two) times daily as needed for heartburn or indigestion., Disp: 30 tablet, Rfl: 0 .  FLUoxetine (PROZAC) 40 MG capsule, TAKE 1 CAPSULE(40 MG) BY MOUTH DAILY, Disp: 15 capsule, Rfl: 0 .  gabapentin (NEURONTIN) 100 MG capsule, Take 2 capsules (200 mg total) by mouth at bedtime., Disp: 60 capsule, Rfl: 3 .  glucose blood (ACCU-CHEK GUIDE) test strip, Use as directed once a day, Disp: 100 each, Rfl: 3 .  losartan (COZAAR) 100 MG tablet, TAKE 1 TABLET(100 MG) BY MOUTH DAILY, Disp: 30 tablet, Rfl: 0 .  magnesium hydroxide (MILK OF MAGNESIA) 400 MG/5ML suspension, Take 15 mLs by mouth at bedtime as needed., Disp: 355 mL, Rfl: 0 .  MELATONIN PO, Take 1 mg by mouth at bedtime., Disp: , Rfl:  .  meloxicam (MOBIC) 7.5 MG tablet, Take 1 tablet (7.5 mg total) by mouth daily., Disp: 15 tablet, Rfl: 0 .  omeprazole (PRILOSEC) 40 MG capsule, TAKE 1 CAPSULE(40 MG) BY MOUTH DAILY, Disp: 30 capsule, Rfl: 0 .  spironolactone (ALDACTONE) 25 MG tablet, Take 25 mg by mouth 2 (two) times daily., Disp: , Rfl: 0 .  VITAMIN D PO, Take 5,000 Units by mouth daily., Disp: , Rfl:  Home Medications  Prior to Admission medications   Medication Sig Start Date End Date Taking? Authorizing Provider  Accu-Chek FastClix Lancets MISC Use as directed 04/14/19   Colin Benton R, DO  amLODipine (NORVASC) 10 MG tablet TAKE 1 TABLET(10 MG) BY MOUTH DAILY 04/27/20   [name], DO  aspirin 81 MG tablet Take 81 mg by mouth daily.     [provider]  carvedilol (COREG) 12.5 MG tablet TAKE 1 TABLET(12.5 MG) BY MOUTH TWICE DAILY WITH A MEAL 04/07/20   Yu, Amy V, PA-C  cholecalciferol (VITAMIN D) 1000 UNITS tablet Take 4,000 Units by mouth daily.     [provider]  famotidine (PEPCID) 20 MG tablet Take 1 tablet (20 mg total) by mouth 2 (two) times daily as needed for heartburn or indigestion. 11/15/20   Wieters, Hallie C, PA-C  FLUoxetine (PROZAC) 40 MG capsule TAKE 1 CAPSULE(40 MG) BY MOUTH DAILY 08/21/20   [name], DO  gabapentin (NEURONTIN) 100 MG capsule Take 2 capsules (200 mg total) by mouth at bedtime. 03/09/19   Lyndal Pulley, DO  glucose blood (ACCU-CHEK GUIDE) test strip Use as directed once a day 04/14/19   [name], DO  losartan (COZAAR) 100 MG tablet TAKE 1 TABLET(100 MG) BY MOUTH DAILY 06/20/20   [name], DO  magnesium hydroxide (MILK OF MAGNESIA) 400 MG/5ML suspension Take 15 mLs by mouth at bedtime as needed. 11/15/20   Wieters, Hallie C, PA-C  MELATONIN PO Take 1 mg by mouth at bedtime.    [provider]  meloxicam (MOBIC) 7.5 MG tablet Take 1 tablet (7.5 mg total) by mouth daily. 04/07/20   Tasia Catchings, Amy V, PA-C  omeprazole (PRILOSEC) 40 MG capsule TAKE 1 CAPSULE(40 MG) BY MOUTH DAILY 03/20/20   [name], DO  spironolactone (ALDACTONE) 25 MG tablet Take 25 mg by mouth 2 (two) times daily. 02/02/16   [provider]  VITAMIN D PO Take 5,000 Units by mouth daily.    [provider]      Critical care time: 30 minutes.  The treatment and management of the patient's condition was required based on the threat of imminent deterioration. This time reflects time spent by the physician evaluating, providing care and managing the critically ill patient's care. The time was spent at the immediate bedside (or on the same floor/unit and dedicated to this patient's care). Time involved in separately billable procedures is NOT included int he critical care time indicated  above. Family meeting and update time may be included above if and only if the patient is unable/incompetent to participate in clinical interview and/or decision making, and the discussion was necessary to determining treatment decisions.   Renee Pain, MD Board Certified by the ABIM, [place]

## 2020-11-23 NOTE — Progress Notes (Signed)
[place] to chest tube was changed upon patient arrival on unit from ED.  Output charted.

## 2020-11-23 NOTE — ED Notes (Signed)
Attempted report x 3.  

## 2020-11-23 NOTE — ED Triage Notes (Signed)
Patient from home with [facility], SOB x 2 weeks, worsening today, EMS called, fire placed patient on NRB @ 15L, albuterol neb given in route, 125 mg Solumedrol   EMS Vital 110 HR 190/90  95% NRB  20 L hand

## 2020-11-23 NOTE — ED Notes (Signed)
Attempted report x1. 

## 2020-11-23 NOTE — Procedures (Signed)
Insertion of Chest Tube Procedure Note  Yvette Roberts  [id number]  [birth year]  Date:11/23/20  Time:7:50 AM    Provider Performing: Candee Furbish   Procedure: Pleural Catheter Insertion w/ Imaging Guidance [phone])  Indication(s) Effusion  Consent Risks of the procedure as well as the alternatives and risks of each were explained to the patient and/or caregiver.  Consent for the procedure was obtained and is signed in the bedside chart  Anesthesia Topical only with 1% lidocaine    Time Out Verified patient identification, verified procedure, site/side was marked, verified correct patient position, special equipment/implants available, medications/allergies/relevant history reviewed, required imaging and test results available.   Sterile Technique Maximal sterile technique including full sterile barrier drape, hand hygiene, sterile gown, sterile gloves, mask, hair covering, sterile ultrasound probe cover (if used).   Procedure Description Ultrasound used to identify appropriate pleural anatomy for placement and overlying skin marked. Area of placement cleaned and draped in sterile fashion.  A 14 French pigtail pleural catheter was placed into the right pleural space using Seldinger technique. Appropriate return of fluid was obtained.  The tube was connected to atrium and placed on -20 cm H2O wall suction.   Complications/Tolerance None; patient tolerated the procedure well. Chest X-ray is ordered to verify placement.   EBL Minimal  Specimen(s) fluid

## 2020-11-23 NOTE — Progress Notes (Signed)
eLink Physician-Brief Progress Note Patient Name: Yvette Roberts DOB: 1956-06-07 MRN: [id number]   Date of Service  11/23/2020  HPI/Events of Note  Patient asking for Melatonin as a sleep aid.  eICU Interventions  Melatonin ordered PRN insomnia.        Frederik Pear 11/23/2020, 8:57 PM

## 2020-11-23 NOTE — ED Notes (Signed)
Attempted report x 2 

## 2020-11-24 ENCOUNTER — Inpatient Hospital Stay (HOSPITAL_COMMUNITY): Payer: Medicare Other

## 2020-11-24 DIAGNOSIS — J189 Pneumonia, unspecified organism: Secondary | ICD-10-CM | POA: Diagnosis not present

## 2020-11-24 DIAGNOSIS — J918 Pleural effusion in other conditions classified elsewhere: Secondary | ICD-10-CM

## 2020-11-24 LAB — BASIC METABOLIC PANEL
Anion gap: 10 (ref 5–15)
BUN: 23 mg/dL (ref 8–23)
CO2: 25 mmol/L (ref 22–32)
Calcium: 9 mg/dL (ref 8.9–10.3)
Chloride: 99 mmol/L (ref 98–111)
Creatinine, Ser: 0.94 mg/dL (ref 0.44–1.00)
GFR, Estimated: >60 mL/min (ref >60)
Glucose, Bld: 95 mg/dL (ref 70–99)
Potassium: 5.2 mmol/L — ABNORMAL HIGH (ref 3.5–5.1)
Sodium: 134 mmol/L — ABNORMAL LOW (ref 135–145)

## 2020-11-24 LAB — CBC
HCT: 39.4 % (ref 36.0–46.0)
Hemoglobin: 13.3 g/dL (ref 12.0–15.0)
MCH: 28 pg (ref 26.0–34.0)
MCHC: 33.8 g/dL (ref 30.0–36.0)
MCV: 82.9 fL (ref 80.0–100.0)
Platelets: 368 10*3/uL (ref 150–400)
RBC: 4.75 MIL/uL (ref 3.87–5.11)
RDW: 15.5 % (ref 11.5–15.5)
WBC: 24.7 10*3/uL — ABNORMAL HIGH (ref 4.0–10.5)
nRBC: 0 % (ref 0.0–0.2)

## 2020-11-24 LAB — GLUCOSE, CAPILLARY
Glucose-Capillary: 123 mg/dL — ABNORMAL HIGH (ref 70–99)
Glucose-Capillary: 134 mg/dL — ABNORMAL HIGH (ref 70–99)
Glucose-Capillary: 129 mg/dL — ABNORMAL HIGH (ref 70–99)
Glucose-Capillary: 110 mg/dL — ABNORMAL HIGH (ref 70–99)
Glucose-Capillary: 120 mg/dL — ABNORMAL HIGH (ref 70–99)

## 2020-11-24 LAB — MAGNESIUM: Magnesium: 2.5 mg/dL — ABNORMAL HIGH (ref 1.7–2.4)

## 2020-11-24 MED ORDER — INSULIN ASPART 100 UNIT/ML ~~LOC~~ SOLN
0.0000 [IU] | Freq: Three times a day (TID) | SUBCUTANEOUS | Status: DC
  Administered 2020-11-25 (×2): 2 [IU] via SUBCUTANEOUS

## 2020-11-24 MED ORDER — STERILE WATER FOR INJECTION IJ SOLN
INTRAMUSCULAR | Status: AC
  Filled 2020-11-24: qty 10

## 2020-11-24 MED ORDER — IBUPROFEN 400 MG PO TABS
400.0000 mg | ORAL_TABLET | Freq: Four times a day (QID) | ORAL | Status: DC | PRN
  Administered 2020-11-24 – 2020-11-28 (×9): 400 mg via ORAL
  Filled 2020-11-24 (×10): qty 1

## 2020-11-24 MED ORDER — FAMOTIDINE 20 MG PO TABS
20.0000 mg | ORAL_TABLET | Freq: Two times a day (BID) | ORAL | Status: DC | PRN
  Administered 2020-11-24 – 2020-11-26 (×2): 20 mg via ORAL
  Filled 2020-11-24 (×2): qty 1

## 2020-11-24 MED ORDER — INSULIN ASPART 100 UNIT/ML ~~LOC~~ SOLN: 0.0000 [IU] | Freq: Every day | SUBCUTANEOUS | Status: DC

## 2020-11-24 MED ORDER — LIDOCAINE 5 % EX PTCH
1.0000 | MEDICATED_PATCH | CUTANEOUS | Status: DC
  Administered 2020-11-24 – 2020-11-30 (×7): 1 via TRANSDERMAL
  Filled 2020-11-24 (×7): qty 1

## 2020-11-24 MED ORDER — PANTOPRAZOLE SODIUM 40 MG PO TBEC
40.0000 mg | DELAYED_RELEASE_TABLET | Freq: Every day | ORAL | Status: DC
  Administered 2020-11-24 – 2020-11-27 (×4): 40 mg via ORAL
  Filled 2020-11-24 (×5): qty 1

## 2020-11-24 MED ORDER — AZITHROMYCIN 500 MG PO TABS
500.0000 mg | ORAL_TABLET | Freq: Every day | ORAL | Status: DC
  Filled 2020-11-24: qty 1

## 2020-11-24 MED ORDER — ALBUTEROL SULFATE (2.5 MG/3ML) 0.083% IN NEBU: 2.5000 mg | INHALATION_SOLUTION | RESPIRATORY_TRACT | Status: DC | PRN

## 2020-11-24 MED ORDER — AZITHROMYCIN 250 MG PO TABS
500.0000 mg | ORAL_TABLET | Freq: Every day | ORAL | Status: DC
  Administered 2020-11-24 – 2020-11-25 (×2): 500 mg via ORAL
  Filled 2020-11-24: qty 2
  Filled 2020-11-24: qty 1

## 2020-11-24 MED ORDER — FLUOXETINE HCL 20 MG PO CAPS
40.0000 mg | ORAL_CAPSULE | Freq: Every day | ORAL | Status: DC
  Administered 2020-11-24 – 2020-11-30 (×7): 40 mg via ORAL
  Filled 2020-11-24 (×6): qty 2
  Filled 2020-11-24: qty 4
  Filled 2020-11-24 (×3): qty 2

## 2020-11-24 MED ORDER — DOXYCYCLINE HYCLATE 100 MG PO TABS
100.0000 mg | ORAL_TABLET | Freq: Two times a day (BID) | ORAL | Status: DC
  Administered 2020-11-24 – 2020-11-25 (×3): 100 mg via ORAL
  Filled 2020-11-24 (×4): qty 1

## 2020-11-24 MED ORDER — IBUPROFEN 200 MG PO TABS
800.0000 mg | ORAL_TABLET | Freq: Once | ORAL | Status: AC
  Administered 2020-11-24: 800 mg via ORAL
  Filled 2020-11-24: qty 4

## 2020-11-24 MED ORDER — ASPIRIN 81 MG PO CHEW
81.0000 mg | CHEWABLE_TABLET | Freq: Every day | ORAL | Status: DC
  Administered 2020-11-24 – 2020-11-30 (×7): 81 mg via ORAL
  Filled 2020-11-24 (×7): qty 1

## 2020-11-24 MED ORDER — VITAMIN D 25 MCG (1000 UNIT) PO TABS
2000.0000 [IU] | ORAL_TABLET | Freq: Every day | ORAL | Status: DC
  Administered 2020-11-24 – 2020-11-30 (×7): 2000 [IU] via ORAL
  Filled 2020-11-24 (×8): qty 2

## 2020-11-24 NOTE — Progress Notes (Signed)
Occupational Therapy Evaluation  PTA pt lives alone independently. Works. Session limited by complaints of abdominal pain and feeling nauseated. Increased SOB with activity however pt states it has improved since admission. Chest tube on waterseal with SpO2 @ 91 on 4L with activty. Pt will benefit from [facility] due to deficits listed below. Will follow acutely to facilitate safe DC home.     11/24/20 1200  OT Visit Information  Last OT Received On 11/24/20  Assistance Needed +1  History of Present Illness 65 yo admitted 1/27 with respiratory failure with intubation/extubation same date. Pigtail catheter placed 1/27 for right pleural effusion. PMhx: depression, R frozen shoulder, DM  Precautions  Precautions Fall  Precaution Comments watch sats, chest tube  Home Living  Family/patient expects to be discharged to: Private residence  Living Arrangements Alone  Available Help at Discharge Family;Available PRN/intermittently  Type of Home Apartment  Home Access Level entry  Home Layout One level  Bathroom Shower/Tub Biochemist, clinical Yes  How Accessible Accessible via walker  Hyndman held shower head;Shower seat  Prior Function  Level of Independence Independent  Comments drives; likes sports  Communication  Communication No difficulties  Pain Assessment  Pain Assessment 0-10  Pain Score 8  Pain Location stomach; CT site  Pain Descriptors / Indicators Aching;Guarding;Sore;Cramping  Pain Intervention(s) Limited activity within patient's tolerance  Cognition  Arousal/Alertness Awake/alert  Behavior During Therapy WFL for tasks assessed/performed  Overall Cognitive Status Within Functional Limits for tasks assessed  Upper Extremity Assessment  Upper Extremity Assessment RUE deficits/detail  RUE Deficits / Details Hx of R frozen shoulder per pt; Shoulder ROM limited but funcitonal; has not had formal therpay; complains of  "buringn" on vertebral scapular border - appeasr muscular  Lower Extremity Assessment  Lower Extremity Assessment Defer to PT evaluation  Cervical / Trunk Assessment  Cervical / Trunk Assessment Other exceptions (chest tube)  ADL  Overall ADL's  Needs assistance/impaired  Eating/Feeding Independent  Grooming Set up;Sitting  Upper Body Bathing Set up;Sitting  Lower Body Bathing Moderate assistance;Sit to/from stand  Lower Body Bathing Details (indicate cue type and reason) able complete figure four position normally  Upper Body Dressing  Minimal assistance  Upper Body Dressing Details (indicate cue type and reason) due to soreness  Lower Body Dressing Moderate assistance;Sit to/from Retail buyer Minimal assistance  Toileting- Water quality scientist and Hygiene Moderate assistance  Toileting - Clothing Manipulation Details (indicate cue type and reason) has foley  Functional mobility during ADLs Minimal assistance  General ADL Comments increased SOB; will benefit from energy conservation  Bed Mobility  Overal bed mobility Needs Assistance  Bed Mobility Supine to Sit  Supine to sit [facility] elevated;Min assist  Transfers  Overall transfer level Needs assistance  Sit to Stand Min assist (HHA)  Balance  Sitting balance-Leahy Scale Good  Standing balance-Leahy Scale Fair  OT - End of Session  Equipment Utilized During Treatment Oxygen (4L)  Activity Tolerance Patient limited by pain  Patient left in bed;with call bell/phone within reach  Nurse Communication Mobility status  OT Assessment  OT Recommendation/Assessment Patient needs continued OT Services  OT Visit Diagnosis Unsteadiness on feet (R26.81);Other abnormalities of gait and mobility (R26.89);Muscle weakness (generalized) (M62.81);Pain  Pain - part of body  (abdominal)  OT Problem List Decreased strength;Decreased activity tolerance;Impaired balance (sitting and/or standing);Decreased safety awareness;Decreased  knowledge of use of DME or AE;Cardiopulmonary status limiting activity;Obesity;Pain  OT Plan  OT Frequency (ACUTE ONLY)  Min 2X/week  OT Treatment/Interventions (ACUTE ONLY) Self-care/ADL training;Therapeutic exercise;Energy conservation;DME and/or AE instruction;Therapeutic activities;Patient/family education;Balance training  AM-PAC OT "6 Clicks" Daily Activity Outcome Measure (Version 2)  Help from another person eating meals? 4  Help from another person taking care of personal grooming? 3  Help from another person toileting, which includes using toliet, bedpan, or urinal? 2  Help from another person bathing (including washing, rinsing, drying)? 2  Help from another person to put on and taking off regular upper body clothing? 3  Help from another person to put on and taking off regular lower body clothing? 2  6 Click Score 16  OT Recommendation  Follow Up Recommendations Home health OT;Supervision - Intermittent  OT Equipment Other (comment) (RW)  Individuals Consulted  Consulted and Agree with Results and Recommendations Patient  Acute Rehab OT Goals  Patient Stated Goal return home and to work  OT Goal Formulation With patient  Time For Goal Achievement 12/08/20  Potential to Achieve Goals Good  OT Time Calculation  OT Start Time (ACUTE ONLY) 1218  OT Stop Time (ACUTE ONLY) 1236  OT Time Calculation (min) 18 min  OT General Charges  $OT Visit 1 Visit  OT Evaluation  $OT Eval Moderate Complexity 1 Mod  Written Expression  Dominant Hand Right  Maurie Boettcher, OT/L   Acute OT Clinical Specialist Acute Rehabilitation Services Pager [phone] Office [phone]

## 2020-11-24 NOTE — Progress Notes (Signed)
Camp assisted with camera visit to son [name].

## 2020-11-24 NOTE — Evaluation (Signed)
Clinical/Bedside Swallow Evaluation Patient Details  Name: Yvette Roberts MRN: [id number] Date of Birth: 07/30/1956  Today's Date: 11/24/2020 Time: SLP Start Time (ACUTE ONLY): 1150 SLP Stop Time (ACUTE ONLY): 1200 SLP Time Calculation (min) (ACUTE ONLY): 10 min  Past Medical History:  Past Medical History:  Diagnosis Date  . Allergy   . Arthritis   . Depression   . Esophageal reflux 07/21/2014  . Frozen shoulder 05/10/2015   Injected 05/10/2015   . Obesity, unspecified 07/21/2014  . Resistant hypertension 07/21/2014  . Type 2 diabetes mellitus without complication ([place]) 4/33/2951   Past Surgical History:  Past Surgical History:  Procedure Laterality Date  . LAPAROSCOPIC ABDOMINAL EXPLORATION    . miscarr     HPI:  Pt is a 65 y.o. female with PMH depression, frozen shoulder, DM who presented to the ED via EMS with complaints of shortness of breath and  admitted for SOB. Pt was intubated in the ED on 1/27 due to worsening respiratory failure and subsequently extubated on that date.  Pigtail catheter placed 1/27 for right pleural effusion. CXR 1/28: Persistent right basilar consolidation and right upper lobe airspace infiltrate, likely infectious.   Assessment / Plan / Recommendation Clinical Impression  Pt was seen for bedside swallow evaluation and she denied a history of dysphagia. Nursing reported that she has been asymptomatic of aspiration since extubation. Oral mechanism exam was [facility] and dentition adequate. She tolerated all solids and liquids without signs or symptoms of oropharyngeal dysphagia. A regular texture diet with thin liquids is recommended at this time and further skilled SLP services are not clinically indicated for swallowing. SLP Visit Diagnosis: Dysphagia, unspecified (R13.10)    Aspiration Risk  No limitations    Diet Recommendation Regular   Liquid Administration via: Cup;Straw Medication Administration: Whole meds with liquid Postural Changes: Seated  upright at 90 degrees    Other  Recommendations Oral Care Recommendations: Oral care BID   Follow up Recommendations None      Frequency and Duration            Prognosis        Swallow Study   General Date of Onset: 11/23/20 HPI: Pt is a 65 y.o. female with PMH depression, frozen shoulder, DM who presented to the ED via EMS with complaints of shortness of breath and  admitted for SOB. Pt was intubated in the ED on 1/27 due to worsening respiratory failure and subsequently extubated on that date.  Pigtail catheter placed 1/27 for right pleural effusion. CXR 1/28: Persistent right basilar consolidation and right upper lobe airspace infiltrate, likely infectious. Type of Study: Bedside Swallow Evaluation Previous Swallow Assessment: None Diet Prior to this Study: Regular;Thin liquids Temperature Spikes Noted: No Respiratory Status: Nasal cannula History of Recent Intubation: Yes Length of Intubations (days): 1 days Date extubated: 11/23/20 Behavior/Cognition: Alert;Cooperative;Pleasant mood Oral Cavity Assessment: Within Functional Limits Oral Care Completed by SLP: Recent completion by staff Oral Cavity - Dentition: Adequate natural dentition Vision: Functional for self-feeding Self-Feeding Abilities: Able to feed self Patient Positioning: Upright in bed;Postural control adequate for testing Baseline Vocal Quality: Normal Volitional Cough: Strong Volitional Swallow: Able to elicit    Oral/Motor/Sensory Function Overall Oral Motor/Sensory Function: Within functional limits   Ice Chips Ice chips: Within functional limits Presentation: Spoon   Thin Liquid Thin Liquid: Within functional limits Presentation: Straw    Nectar Thick Nectar Thick Liquid: Not tested   Honey Thick Honey Thick Liquid: Not tested   Puree Puree: Within functional  limits Presentation: Spoon   Solid     Solid: Within functional limits Presentation: Self Fed     Alyia Lacerte I. Hardin Negus, [place],  [place] Office number [phone] Pager [place] 11/24/2020,12:02 PM

## 2020-11-24 NOTE — Progress Notes (Signed)
   11/24/20 1519  Vitals  Temp 97.8 F (36.6 C)  Temp Source Oral  BP 126/82  MAP (mmHg) 94  BP Location Right Arm  BP Method Automatic  Patient Position (if appropriate) Lying  Pulse Rate 86  Pulse Rate Source Dinamap  Resp (!) 26  Level of Consciousness  Level of Consciousness Alert  MEWS COLOR  MEWS Score Color Yellow  Oxygen Therapy  SpO2 98 %  O2 Device Nasal Cannula  O2 Flow Rate (L/min) 4 L/min  Pain Assessment  Pain Scale 0-10  Pain Score 2  Pain Type Acute pain  Pain Location Abdomen  Pain Orientation Right  Pain Descriptors / Indicators Aching  Pain Frequency Intermittent  Multiple Pain Sites No  PCA/Epidural/Spinal Assessment  Respiratory Pattern Tachypnea  MEWS Score  MEWS Temp 0  MEWS Systolic 0  MEWS Pulse 0  MEWS RR 2  MEWS LOC 0  MEWS Score 2  Provider Notification  Provider Name/Title Chesley Mires, MD  Date Provider Notified 11/24/20  Time Provider Notified 1536  Notification Type Page (via Shea Evans)  Rapid Response Notification  Name of Rapid Response RN Notified N/A

## 2020-11-24 NOTE — Evaluation (Signed)
Physical Therapy Evaluation Patient Details Name: Yvette Roberts MRN: [id number] DOB: [birth year] Today's Date: 11/24/2020   History of Present Illness  65 yo admitted 1/27 with respiratory failure with intubation/extubation same date. Pigtail catheter placed 1/27 for right pleural effusion. PMhx: depression, frozen shoulder, DM  Clinical Impression  Pt pleasant on 4L on arrival with SPo2 94%. Pt able to transition to sitting EOB, stand and walk limited distance with single UE support. Pt with slight balance deficits and limited by fatigue with need for 6L with gait to maintain 90-94%. Pt with decreased activity tolerance, balance and mobility who will benefit from acute therapy to maximize mobility, safety and independence for safe return home. Encouraged OOB daily with nursing assist.   HR 97 BP supine 113/83 (91), sitting 142/81 (95)    Follow Up Recommendations Home health PT    Equipment Recommendations  Rolling walker with 5" wheels    Recommendations for Other Services OT consult     Precautions / Restrictions Precautions Precautions: Fall Precaution Comments: watch sats, chest tube      Mobility  Bed Mobility Overal bed mobility: Needs Assistance Bed Mobility: Supine to Sit     Supine to sit: HOB elevated;Min assist     General bed mobility comments: min assist to fully pivot to right with cues not to roll onto CT, HHA to lift trunk, increased time    Transfers Overall transfer level: Needs assistance   Transfers: Sit to/from Stand Sit to Stand: Min assist         General transfer comment: min assist to stand and stablize at EOB x 2 trials, limited initially by dizziness  Ambulation/Gait Ambulation/Gait assistance: Min assist Gait Distance (Feet): 40 Feet Assistive device: 1 person hand held assist Gait Pattern/deviations: Step-through pattern;Decreased stride length   Gait velocity interpretation: 1.31 - 2.62 ft/sec, indicative of limited community  ambulator General Gait Details: pt with slight unsteadiness with need for single UE support for gait and assist to manage lines. Pt on 6L for gait with SpO2 90-94%  Stairs            Wheelchair Mobility    Modified Rankin (Stroke Patients Only)       Balance Overall balance assessment: Needs assistance Sitting-balance support: No upper extremity supported;Feet supported Sitting balance-Leahy Scale: Good Sitting balance - Comments: EOB without assist   Standing balance support: Single extremity supported Standing balance-Leahy Scale: Fair                               Pertinent Vitals/Pain Pain Assessment: 0-10 Pain Score: 4  Pain Location: right chest at CT Pain Descriptors / Indicators: Aching;Guarding;Sore Pain Intervention(s): Limited activity within patient's tolerance;Monitored during session;Repositioned    Home Living Family/patient expects to be discharged to:: Private residence Living Arrangements: Alone Available Help at Discharge: Family;Available PRN/intermittently Type of Home: Apartment Home Access: Level entry     Home Layout: One level Home Equipment: None 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    Prior Function Level of Independence: Independent               Hand Dominance        Extremity/Trunk Assessment   Upper Extremity Assessment Upper Extremity Assessment: Overall WFL for tasks assessed    Lower Extremity Assessment Lower Extremity Assessment: Overall WFL for tasks assessed    Cervical / Trunk Assessment Cervical / Trunk Assessment: Other exceptions Cervical / Trunk Exceptions: guarding  at trunk due to CT  Communication   Communication: No difficulties  Cognition Arousal/Alertness: Awake/alert Behavior During Therapy: WFL for tasks assessed/performed Overall Cognitive Status: Within Functional Limits for tasks assessed                                        General Comments       Exercises     Assessment/Plan    PT Assessment Patient needs continued PT services  PT Problem List Decreased mobility;Decreased activity tolerance;Decreased balance;Decreased knowledge of use of DME;Cardiopulmonary status limiting activity       PT Treatment Interventions Gait training;Functional mobility training;Therapeutic activities;Patient/family education;Balance training;Therapeutic exercise;DME instruction    PT Goals (Current goals can be found in the Care Plan section)  Acute Rehab PT Goals Patient Stated Goal: return home and to work PT Goal Formulation: With patient Time For Goal Achievement: 12/08/20 Potential to Achieve Goals: Good    Frequency Min 3X/week   Barriers to discharge Decreased caregiver support son lives in [place] and granddaughter in [place] PT "6 Clicks" Mobility  Outcome Measure Help needed turning from your back to your side while in a flat bed without using bedrails?: A Little Help needed moving from lying on your back to sitting on the side of a flat bed without using bedrails?: A Little Help needed moving to and from a bed to a chair (including a wheelchair)?: A Little Help needed standing up from a chair using your arms (e.g., wheelchair or bedside chair)?: A Little Help needed to walk in hospital room?: A Little Help needed climbing 3-5 steps with a railing? : A Little 6 Click Score: 18    End of Session Equipment Utilized During Treatment: Oxygen Activity Tolerance: Patient tolerated treatment well Patient left: in chair;with call bell/phone within reach Nurse Communication: Mobility status PT Visit Diagnosis: Other abnormalities of gait and mobility (R26.89);Difficulty in walking, not elsewhere classified (R26.2)    Time: 8295-6213 PT Time Calculation (min) (ACUTE ONLY): 30 min   Charges:   PT Evaluation $PT Eval Moderate Complexity: 1 Mod PT Treatments $Gait Training: 8-22  mins        Maija P, PT Acute Rehabilitation Services Pager: [phone] Office: [place] 11/24/2020, 9:04 AM

## 2020-11-24 NOTE — Progress Notes (Signed)
NAME:  Yvette Roberts, MRN:  [id number], DOB:  1956/07/22, LOS: 1 ADMISSION DATE:  11/23/2020, CONSULTATION DATE:  11/23/2020 REFERRING MD:  Dr. Roxanne Mins, ER, CHIEF COMPLAINT:  Short of breath   Brief History:  65 yo female presented with progressive dyspnea.  Found to have large exudate pleural effusion requiring chest tube drainage.  Past Medical History:  DM type 2, HTN, GERD, Depression, OA, Allergies  Significant Hospital Events:  1/27 admit, chest tube placed 1/28 transfer to floor bed  Consults:    Procedures:  ETT 1/27 >> 1/27 (self extubated) Rt pig tail catheter 1/27 >>   Significant Diagnostic Tests:  CT chest 1/27 >> large Rt effusion with collapse of Rt lung Rt pleural fluid 1/27 >> protein 5.3, LDH 1523, WBC 2280 (67% neutrophils) CT chest 1/27 >> consolidation of Rt lung most in middle and lower lobes  Micro Data:  COVID 1/27 >> negative MRSA PCR 1/27 >> negative Blood 1/27 >> Rt pleural fluid 1/27 >>  Antimicrobials:  Doxycycline 1/28 >>  Zithromax 1/28 >>  Interim History / Subjective:  Breathing better.  Sore at chest tube site.  Objective   Blood pressure 116/72, pulse 84, temperature 99.14 F (37.3 C), resp. rate (!) 39, height 5\' 6"  (1.676 m), weight 97 kg, SpO2 94 %.    Vent Mode: PRVC FiO2 (%):  [36 %-40 %] 36 % Set Rate:  [18 bmp] 18 bmp Vt Set:  [470 mL] 470 mL PEEP:  [5 cmH20] 5 cmH20   Intake/Output Summary (Last 24 hours) at 11/24/2020 1150 Last data filed at 11/24/2020 0600 Gross per 24 hour  Intake 2198.9 ml  Output 1350 ml  Net 848.9 ml   Filed Weights   11/23/20 0322 11/23/20 0826 11/24/20 0500  Weight: 111 kg 103.6 kg 97 kg    Examination:  General - alert Eyes - pupils reactive ENT - no sinus tenderness, no stridor Cardiac - regular rate/rhythm, no murmur Chest - rhonchi Rt > Lt, Rt chest tube in place Abdomen - soft, non tender, + bowel sounds Extremities - no cyanosis, clubbing, or edema Skin - no rashes Neuro -  normal strength, moves extremities, follows commands Psych - normal mood and behavior   Resolved Hospital Problem list     Assessment & Plan:   Community acquired bacterial pneumonia involving RML and RLL. Rt parapneumonic effusion s/p chest tube insertion. - add zithromax to doxycyline; has PCN allergy - f/u pleural fluid culture and cytology - f/u CXR - continue chest tube to water seal - flush catheter er protocol  Hx of HTN. - continue ASA - hold outpt norvasc, cozaar, aldactone for now  Hx of depression. - continue prozac  DM type II. - SSI  Best practice (evaluated daily)  Diet: carb modified DVT prophylaxis: SQ heparin GI prophylaxis: protonix Mobility: as tolerated Disposition: med-surg Code Status: full code  Transfer to floor bed 1/28.  [facility] primary 1/29 and PCCM follow as consult.  Labs    CMP Latest Ref Rng & Units 11/24/2020 11/23/2020 11/23/2020  Glucose 70 - 99 mg/dL 95 - -  BUN 8 - 23 mg/dL 23 - -  Creatinine 0.44 - 1.00 mg/dL 0.94 - -  Sodium 135 - 145 mmol/L 134(L) - 133(L)  Potassium 3.5 - 5.1 mmol/L 5.2(H) - 4.4  Chloride 98 - 111 mmol/L 99 - -  CO2 22 - 32 mmol/L 25 - -  Calcium 8.9 - 10.3 mg/dL 9.0 - -  Total Protein 6.5 - 8.1  g/dL - 7.0 -  Total Bilirubin 0.3 - 1.2 mg/dL - - -  Alkaline Phos 38 - 126 U/L - - -  AST 15 - 41 U/L - - -  ALT 0 - 44 U/L - - -    CBC Latest Ref Rng & Units 11/24/2020 11/23/2020 11/23/2020  WBC 4.0 - 10.5 K/uL 24.7(H) - -  Hemoglobin 12.0 - 15.0 g/dL 13.3 15.6(H) 14.3  Hematocrit 36.0 - 46.0 % 39.4 46.0 42.0  Platelets 150 - 400 K/uL 368 - -    ABG    Component Value Date/Time   PHART 7.324 (L) 11/23/2020 0421   PCO2ART 46.0 11/23/2020 0421   PO2ART 283 (H) 11/23/2020 0421   HCO3 26.4 11/23/2020 0525   TCO2 29 11/23/2020 0525   ACIDBASEDEF 6.0 (H) 11/23/2020 0525   O2SAT 99.0 11/23/2020 0525    CBG (last 3)  Recent Labs    11/24/20 0345 11/24/20 0844 11/24/20 1149  GLUCAP 123* 134* 129*     Signature:  Chesley Mires, MD [place] Pager - [phone] 11/24/2020, 12:07 PM

## 2020-11-25 ENCOUNTER — Inpatient Hospital Stay (HOSPITAL_COMMUNITY): Payer: Medicare Other

## 2020-11-25 DIAGNOSIS — J189 Pneumonia, unspecified organism: Secondary | ICD-10-CM | POA: Diagnosis not present

## 2020-11-25 DIAGNOSIS — J918 Pleural effusion in other conditions classified elsewhere: Secondary | ICD-10-CM | POA: Diagnosis not present

## 2020-11-25 LAB — CBC
HCT: 36.6 % (ref 36.0–46.0)
Hemoglobin: 11.9 g/dL — ABNORMAL LOW (ref 12.0–15.0)
MCH: 27.4 pg (ref 26.0–34.0)
MCHC: 32.5 g/dL (ref 30.0–36.0)
MCV: 84.1 fL (ref 80.0–100.0)
Platelets: 306 10*3/uL (ref 150–400)
RBC: 4.35 MIL/uL (ref 3.87–5.11)
RDW: 15.4 % (ref 11.5–15.5)
WBC: 17.8 10*3/uL — ABNORMAL HIGH (ref 4.0–10.5)
nRBC: 0 % (ref 0.0–0.2)

## 2020-11-25 LAB — GLUCOSE, CAPILLARY
Glucose-Capillary: 148 mg/dL — ABNORMAL HIGH (ref 70–99)
Glucose-Capillary: 99 mg/dL (ref 70–99)
Glucose-Capillary: 126 mg/dL — ABNORMAL HIGH (ref 70–99)
Glucose-Capillary: 101 mg/dL — ABNORMAL HIGH (ref 70–99)

## 2020-11-25 LAB — BASIC METABOLIC PANEL
Anion gap: 11 (ref 5–15)
BUN: 12 mg/dL (ref 8–23)
CO2: 21 mmol/L — ABNORMAL LOW (ref 22–32)
Calcium: 8.5 mg/dL — ABNORMAL LOW (ref 8.9–10.3)
Chloride: 101 mmol/L (ref 98–111)
Creatinine, Ser: 0.72 mg/dL (ref 0.44–1.00)
GFR, Estimated: >60 mL/min (ref >60)
Glucose, Bld: 111 mg/dL — ABNORMAL HIGH (ref 70–99)
Potassium: 4 mmol/L (ref 3.5–5.1)
Sodium: 133 mmol/L — ABNORMAL LOW (ref 135–145)

## 2020-11-25 LAB — PROCALCITONIN: Procalcitonin: 0.63 ng/mL

## 2020-11-25 MED ORDER — ONDANSETRON HCL 4 MG/2ML IJ SOLN
4.0000 mg | Freq: Four times a day (QID) | INTRAMUSCULAR | Status: DC | PRN
  Administered 2020-11-25: 4 mg via INTRAVENOUS
  Filled 2020-11-25: qty 2

## 2020-11-25 MED ORDER — TRAZODONE HCL 50 MG PO TABS
50.0000 mg | ORAL_TABLET | Freq: Every evening | ORAL | Status: DC | PRN
  Administered 2020-11-25 – 2020-11-29 (×5): 50 mg via ORAL
  Filled 2020-11-25 (×6): qty 1

## 2020-11-25 MED ORDER — SODIUM CHLORIDE 0.9 % IV SOLN
2.0000 g | INTRAVENOUS | Status: DC
  Administered 2020-11-25 – 2020-11-28 (×4): 2 g via INTRAVENOUS
  Filled 2020-11-25: qty 20
  Filled 2020-11-25: qty 2
  Filled 2020-11-25 (×3): qty 20
  Filled 2020-11-25: qty 2

## 2020-11-25 MED ORDER — METRONIDAZOLE 500 MG PO TABS
500.0000 mg | ORAL_TABLET | Freq: Three times a day (TID) | ORAL | Status: DC
  Administered 2020-11-25 – 2020-11-29 (×13): 500 mg via ORAL
  Filled 2020-11-25 (×13): qty 1

## 2020-11-25 NOTE — Progress Notes (Signed)
NAME:  Yvette Roberts, MRN:  [id number], DOB:  [birth year], LOS: 2 ADMISSION DATE:  11/23/2020, CONSULTATION DATE:  11/23/2020 REFERRING MD:  Dr. Roxanne Mins, ER, CHIEF COMPLAINT:  Short of breath   Brief History:  65 yo female presented with progressive dyspnea.  Found to have large exudate pleural effusion requiring chest tube drainage.  Past Medical History:  DM type 2, HTN, GERD, Depression, OA, Allergies  Significant Hospital Events:  1/27 admit, chest tube placed 1/28 transfer to floor bed 1/29 chest tube to -20 suction  Consults:    Procedures:  ETT 1/27 >> 1/27 (self extubated) Rt pig tail catheter 1/27 >>   Significant Diagnostic Tests:  CT chest 1/27 >> large Rt effusion with collapse of Rt lung Rt pleural fluid 1/27 >> protein 5.3, LDH 1523, WBC 2280 (67% neutrophils) CT chest 1/27 >> consolidation of Rt lung most in middle and lower lobes  Micro Data:  COVID 1/27 >> negative MRSA PCR 1/27 >> negative Blood 1/27 >> Rt pleural fluid 1/27 >>  Antimicrobials:  Doxycycline 1/28 >> 1/29 Zithromax 1/28 >> 1/29 Rocephin 1/29 >> Flagyl 1/29 >>  Interim History / Subjective:  Had trouble sleeping.  Denies chest pain.  Still coughing up yellow colored sputum.  Objective   Blood pressure 112/72, pulse 95, temperature 98.4 F (36.9 C), resp. rate 18, height 5\' 6"  (1.676 m), weight 97 kg, SpO2 93 %.        Intake/Output Summary (Last 24 hours) at 11/25/2020 1516 Last data filed at 11/25/2020 1500 Gross per 24 hour  Intake 620 ml  Output 450 ml  Net 170 ml   Filed Weights   11/23/20 0322 11/23/20 0826 11/24/20 0500  Weight: 111 kg 103.6 kg 97 kg    Examination:  General - alert Eyes - pupils reactive ENT - no sinus tenderness, no stridor Cardiac - regular rate/rhythm, no murmur Chest - scattered rhonchi, decreased BS Rt base, Rt chest tube in, no air leak Abdomen - soft, non tender, + bowel sounds Extremities - no cyanosis, clubbing, or edema Skin - no  rashes Neuro - normal strength, moves extremities, follows commands Psych - normal mood and behavior   Resolved Hospital Problem list     Assessment & Plan:   Community acquired bacterial pneumonia involving RML and RLL. Rt parapneumonic effusion s/p chest tube insertion. - day 2 of ABx - f/u pleural fluid culture and cytology - chest tube to -20 cm suction - f/u CXR - flush catheter er protocol  Hx of HTN. Hx of depression. DM type II. - per primary team  Best practice (evaluated daily)  Diet: carb modified DVT prophylaxis: SQ heparin GI prophylaxis: protonix Mobility: as tolerated Disposition: med-surg Code Status: full code  Labs    CMP Latest Ref Rng & Units 11/25/2020 11/24/2020 11/23/2020  Glucose 70 - 99 mg/dL 111(H) 95 -  BUN 8 - 23 mg/dL 12 23 -  Creatinine 0.44 - 1.00 mg/dL 0.72 0.94 -  Sodium 135 - 145 mmol/L 133(L) 134(L) -  Potassium 3.5 - 5.1 mmol/L 4.0 5.2(H) -  Chloride 98 - 111 mmol/L 101 99 -  CO2 22 - 32 mmol/L 21(L) 25 -  Calcium 8.9 - 10.3 mg/dL 8.5(L) 9.0 -  Total Protein 6.5 - 8.1 g/dL - - 7.0  Total Bilirubin 0.3 - 1.2 mg/dL - - -  Alkaline Phos 38 - 126 U/L - - -  AST 15 - 41 U/L - - -  ALT 0 - 44 U/L - - -  CBC Latest Ref Rng & Units 11/25/2020 11/24/2020 11/23/2020  WBC 4.0 - 10.5 K/uL 17.8(H) 24.7(H) -  Hemoglobin 12.0 - 15.0 g/dL 11.9(L) 13.3 15.6(H)  Hematocrit 36.0 - 46.0 % 36.6 39.4 46.0  Platelets 150 - 400 K/uL 306 368 -    ABG    Component Value Date/Time   PHART 7.324 (L) 11/23/2020 0421   PCO2ART 46.0 11/23/2020 0421   PO2ART 283 (H) 11/23/2020 0421   HCO3 26.4 11/23/2020 0525   TCO2 29 11/23/2020 0525   ACIDBASEDEF 6.0 (H) 11/23/2020 0525   O2SAT 99.0 11/23/2020 0525    CBG (last 3)  Recent Labs    11/24/20 2122 11/25/20 0756 11/25/20 1147  GLUCAP 120* 148* 99    Signature:  Chesley Mires, MD [place] Pager - [phone] 11/25/2020, 3:16 PM

## 2020-11-25 NOTE — Progress Notes (Signed)
PROGRESS NOTE    Yvette Roberts  ZYS:[id number] DOB: 10/17/1956 DOA: 11/23/2020 PCP: Pcp, No   Chief Complaint  Patient presents with  . Respiratory Distress  Brief Narrative: 65 year old female morbidly obese with type 2 diabetes, depression arthritis/frozen shoulder, GERD, resistant hypertension presented to the ED on 11/23/2020 with shortness of breath, EMS was summoned and on arrival was alert and awake but en route to the ED dyspnea worsened with increasing oxygen requirement placed on nonrebreather and in the ED urgently intubated. Patient was admitted to ICU found to have large exudative pleural effusion requiring chest tube drainage. Chest tube placed 1/27, extubated self 1/27.  1/29 transfer to floor bed CT chest 1/27 >> large Rt effusion with collapse of Rt lung Rt pleural fluid 1/27 >> protein 5.3, LDH 1523, WBC 2280 (67% neutrophils) CT chest 1/27 >> consolidation of Rt lung most in middle and lower lobes  Subjective: Seen this morning.  Patient reports she did not sleep well last night and requesting for sleeping at night. Afebrile overnight WBC remains up On 3 to nasal cannula and blood pressure stable.  Assessment & Plan:  Acute hypoxemic respiratory failure needing intubation, self extubated 1/27.  This is due to her pleural effusion.  Now on 3 to nasal cannula.  Exudative pleural effusion/community-acquired bacterial pneumonia involving RML and RLL: Continue on doxycycline/azithromycin as per PCCM, with penicillin allergy hx in childhodd. Blood culture no growth so far.  Pleural fluid Gram stain no organisms and pleural fluid culture in process. Continue to manage chest tube to waterseal, defer pulmonary re: antibiotics- wondering if we can add cephalosporin for appropriate CAP coverage- discussed w/ pharmacy this am-send procal level. Chest tube care per pulm-repeat chest x-ray done this am: shows right apical pneumothorax 10 mm, progression of right lower lobe airspace  disease and right pleural effusion-notified pulmonary Dr Halford Chessman.  Severe sepsis present on admission with leukocytosis 16.5, tachypnea, hypoxia, AKI: Sepsis parameters resolved.  WBC remains up.  Blood pressure stable still needing oxygen monitor Recent Labs  Lab 11/23/20 0324 11/23/20 0849 11/24/20 0156 11/25/20 0036 11/25/20 1123  WBC 16.5*  --  24.7* 17.8*  --   PROCALCITON  --  1.76  --   --  0.63   Hypertension blood pressure stable continue to hold Norvasc Cozaar Aldactone.  History of depression continue Prozac  Prolonged QTC on EKG on 12/27, check EKG  Type 2 diabetes mellitus without complication: Blood sugar well controlled, stable hemoglobin A1c 6.3 11/23/20. Cont asa. Recent Labs  Lab 11/24/20 1149 11/24/20 1653 11/24/20 2122 11/25/20 0756 11/25/20 1147  GLUCAP 129* 110* 120* 148* 99   Hyponatremia;sodium mildly low, monitor  GERD: Continue PPI  Morbid obesity BMI 35.5 she will benefit with weight loss/lifestyle.  Insomnia add trazodone at bedtime  Nutrition: Diet Order            Diet Carb Modified Fluid consistency: Thin; Room service appropriate? Yes  Diet effective now                 Nutrition Problem: Inadequate oral intake Etiology: inability to eat Signs/Symptoms: NPO status Interventions: Tube feeding  Body mass index is 34.52 kg/m.  DVT prophylaxis: heparin injection 5,000 Units Start: 11/23/20 1400 SCDs Start: 11/23/20 9323 Code Status:   Code Status: Full Code Family Communication: plan of care discussed with patient at bedside.  Status is: Inpatient Remains inpatient appropriate because:IV treatments appropriate due to intensity of illness or inability to take PO and Inpatient level of  care appropriate due to severity of illness  Dispo: The patient is from: Home              Anticipated d/c is to: Home              Anticipated d/c date is: 3 days              Patient currently is not medically stable to d/c.   Difficult to place  patient No  Consultants:see note  Procedures:see note  Culture/Microbiology    Component Value Date/Time   SDES BLOOD RIGHT HAND 11/23/2020 0905   SPECREQUEST  11/23/2020 0905    BOTTLES DRAWN AEROBIC AND ANAEROBIC Blood Culture adequate volume   CULT  11/23/2020 0905    NO GROWTH < 24 HOURS Performed at [facility], [place] [address], [place], [place] [address]    REPTSTATUS PENDING 11/23/2020 0539    Other culture-see note  Medications: Scheduled Meds: . aspirin  81 mg Oral Daily  . Chlorhexidine Gluconate Cloth  6 each Topical Daily  . cholecalciferol  2,000 Units Oral Daily  . docusate  100 mg Per Tube BID  . FLUoxetine  40 mg Oral Daily  . heparin  5,000 Units Subcutaneous Q8H  . insulin aspart  0-15 Units Subcutaneous TID WC  . insulin aspart  0-5 Units Subcutaneous QHS  . lidocaine  1 patch Transdermal Q24H  . metroNIDAZOLE  500 mg Oral Q8H  . pantoprazole  40 mg Oral Daily  . sodium chloride flush  10 mL Intracatheter Q8H   Continuous Infusions: . sodium chloride 50 mL/hr at 11/24/20 1555  . cefTRIAXone (ROCEPHIN)  IV 2 g (11/25/20 1320)    Antimicrobials: Anti-infectives (From admission, onward)   Start     Dose/Rate Route Frequency Ordered Stop   11/25/20 1400  metroNIDAZOLE (FLAGYL) tablet 500 mg        500 mg Oral Every 8 hours 11/25/20 1148     11/25/20 1300  cefTRIAXone (ROCEPHIN) 2 g in sodium chloride 0.9 % 100 mL IVPB        2 g 200 mL/hr over 30 Minutes Intravenous Every 24 hours 11/25/20 1148     11/24/20 1800  azithromycin (ZITHROMAX) tablet 500 mg  Status:  Discontinued        500 mg Oral Daily 11/24/20 1156 11/24/20 1212   11/24/20 1300  azithromycin (ZITHROMAX) tablet 500 mg  Status:  Discontinued        500 mg Oral Daily 11/24/20 1212 11/25/20 1148   11/24/20 1000  doxycycline (VIBRA-TABS) tablet 100 mg  Status:  Discontinued        100 mg Oral Every 12 hours 11/24/20 0913 11/25/20 1148     Objective: Vitals: Today's Vitals    11/24/20 1925 11/24/20 2337 11/25/20 0343 11/25/20 0830  BP: 130/71 122/77 123/65   Pulse: 93 88 87   Resp: (!) 23 20 20    Temp: 98.2 F (36.8 C) (!) 97.4 F (36.3 C) 98.1 F (36.7 C)   TempSrc: Oral  Oral   SpO2: 94% 94% 95%   Weight:      Height:      PainSc: 2    2     Intake/Output Summary (Last 24 hours) at 11/25/2020 1336 Last data filed at 11/25/2020 1015 Gross per 24 hour  Intake 148.83 ml  Output 450 ml  Net -301.17 ml   Filed Weights   11/23/20 0322 11/23/20 0826 11/24/20 0500  Weight: 111 kg 103.6  kg 97 kg   Weight change:   Intake/Output from previous day: 01/28 0701 - 01/29 0700 In: 1166.6 [P.O.:480; I.V.:686.6] Out: 695 [Urine:595; Chest Tube:100] Intake/Output this shift: Total I/O In: 120 [P.O.:120] Out: -  Filed Weights   11/23/20 0322 11/23/20 0826 11/24/20 0500  Weight: 111 kg 103.6 kg 97 kg    Examination: General exam: AAOx3, old for age,NAD, weak appearing. HEENT:Oral mucosa moist, Ear/Nose WNL grossly,dentition normal. Respiratory system: bilaterally diminished breath sound,no wheezing or crackles,no use of accessory muscle, non tender. Cardiovascular system: S1 & S2 +, regular, No JVD. Gastrointestinal system: Abdomen soft, NT,ND, BS+. Nervous System:Alert, awake, moving extremities and grossly nonfocal Extremities: No edema, distal peripheral pulses palpable.  Skin: No rashes,no icterus. MSK: Normal muscle bulk,tone, power   Data Reviewed: I have personally reviewed following labs and imaging studies CBC: Recent Labs  Lab 11/23/20 0324 11/23/20 0421 11/23/20 0525 11/24/20 0156 11/25/20 0036  WBC 16.5*  --   --  24.7* 17.8*  NEUTROABS 10.7*  --   --   --   --   HGB 14.4 14.3 15.6* 13.3 11.9*  HCT 45.3 42.0 46.0 39.4 36.6  MCV 86.0  --   --  82.9 84.1  PLT 474*  --   --  368 323   Basic Metabolic Panel: Recent Labs  Lab 11/23/20 0324 11/23/20 0421 11/23/20 0525 11/24/20 0156 11/25/20 0036  NA 132* 131* 133* 134* 133*  K  4.5 4.0 4.4 5.2* 4.0  CL 96*  --   --  99 101  CO2 22  --   --  25 21*  GLUCOSE 212*  --   --  95 111*  BUN 21  --   --  23 12  CREATININE 1.28*  --   --  0.94 0.72  CALCIUM 9.5  --   --  9.0 8.5*  MG 2.6*  --   --  2.5*  --    GFR: Estimated Creatinine Clearance: 82.3 mL/min (by C-G formula based on SCr of 0.72 mg/dL). Liver Function Tests: Recent Labs  Lab 11/23/20 0324 11/23/20 0849  AST 23  --   ALT 23  --   ALKPHOS 63  --   BILITOT 0.9  --   PROT 8.2* 7.0  ALBUMIN 3.4*  --    No results for input(s): LIPASE, AMYLASE in the last 168 hours. No results for input(s): AMMONIA in the last 168 hours. Coagulation Profile: No results for input(s): INR, PROTIME in the last 168 hours. Cardiac Enzymes: No results for input(s): CKTOTAL, CKMB, CKMBINDEX, TROPONINI in the last 168 hours. BNP (last 3 results) No results for input(s): PROBNP in the last 8760 hours. HbA1C: Recent Labs    11/23/20 0849  HGBA1C 6.3*   CBG: Recent Labs  Lab 11/24/20 1149 11/24/20 1653 11/24/20 2122 11/25/20 0756 11/25/20 1147  GLUCAP 129* 110* 120* 148* 99   Lipid Profile: No results for input(s): CHOL, HDL, LDLCALC, TRIG, CHOLHDL, LDLDIRECT in the last 72 hours. Thyroid Function Tests: No results for input(s): TSH, T4TOTAL, FREET4, T3FREE, THYROIDAB in the last 72 hours. Anemia Panel: No results for input(s): VITAMINB12, FOLATE, FERRITIN, TIBC, IRON, RETICCTPCT in the last 72 hours. Sepsis Labs: Recent Labs  Lab 11/23/20 0849 11/25/20 1123  PROCALCITON 1.76 0.63    Recent Results (from the past 240 hour(s))  SARS Coronavirus 2 by RT PCR (hospital order, performed in [facility] hospital lab) Nasopharyngeal Nasopharyngeal Swab     Status: None   Collection Time:  11/23/20  3:06 AM   Specimen: Nasopharyngeal Swab  Result Value Ref Range Status   SARS Coronavirus 2 NEGATIVE NEGATIVE Final    Comment: (NOTE) SARS-CoV-2 target nucleic acids are NOT DETECTED.  The SARS-CoV-2 RNA is  generally detectable in upper and lower respiratory specimens during the acute phase of infection. The lowest concentration of SARS-CoV-2 viral copies this assay can detect is 250 copies / mL. A negative result does not preclude SARS-CoV-2 infection and should not be used as the sole basis for treatment or other patient management decisions.  A negative result may occur with improper specimen collection / handling, submission of specimen other than nasopharyngeal swab, presence of viral mutation(s) within the areas targeted by this assay, and inadequate number of viral copies (<250 copies / mL). A negative result must be combined with clinical observations, patient history, and epidemiological information.  Fact Sheet for Patients:   StrictlyIdeas.no  Fact Sheet for Healthcare Providers: BankingDealers.co.za  This test is not yet approved or  cleared by the [place] FDA and has been authorized for detection and/or diagnosis of SARS-CoV-2 by FDA under an Emergency Use Authorization (EUA).  This EUA will remain in effect (meaning this test can be used) for the duration of the COVID-19 declaration under Section 564(b)(1) of the Act, 21 U.S.C. section 360bbb-3(b)(1), unless the authorization is terminated or revoked sooner.  Performed at [facility], [place] [address], [place], [place] [address]   Body fluid culture (includes gram stain)     Status: None (Preliminary result)   Collection Time: 11/23/20  7:49 AM   Specimen: Pleural Fluid  Result Value Ref Range Status   Specimen Description PLEURAL  Final   Special Requests NONE  Final   Gram Stain NO WBC SEEN NO ORGANISMS SEEN   Final   Culture   Final    NO GROWTH 2 DAYS Performed at [facility], [place] [address], [place], [place] [address]    Report Status PENDING  Incomplete  Culture, blood (routine x 2)     Status: None (Preliminary result)   Collection Time:  11/23/20  8:49 AM   Specimen: BLOOD  Result Value Ref Range Status   Specimen Description BLOOD LEFT ANTECUBITAL  Final   Special Requests   Final    BOTTLES DRAWN AEROBIC AND ANAEROBIC Blood Culture results may not be optimal due to an inadequate volume of blood received in culture bottles   Culture   Final    NO GROWTH < 24 HOURS Performed at [facility], [place] [address] [place], [place] [address]    Report Status PENDING  Incomplete  Culture, blood (routine x 2)     Status: None (Preliminary result)   Collection Time: 11/23/20  9:05 AM   Specimen: BLOOD RIGHT HAND  Result Value Ref Range Status   Specimen Description BLOOD RIGHT HAND  Final   Special Requests   Final    BOTTLES DRAWN AEROBIC AND ANAEROBIC Blood Culture adequate volume   Culture   Final    NO GROWTH < 24 HOURS Performed at [facility], [place] [address] [place], [place] [address]    Report Status PENDING  Incomplete  MRSA PCR Screening     Status: None   Collection Time: 11/23/20  9:14 AM   Specimen: Nasal Mucosa; Nasopharyngeal  Result Value Ref Range Status   MRSA by PCR NEGATIVE NEGATIVE Final    Comment:  The GeneXpert MRSA Assay (FDA approved for NASAL specimens only), is one component of a comprehensive MRSA colonization surveillance program. It is not intended to diagnose MRSA infection nor to guide or monitor treatment for MRSA infections. Performed at [facility], [place] [address] [place], [place] [address]      Radiology Studies: CT CHEST WO CONTRAST  Result Date: 11/23/2020 CLINICAL DATA:  Right pleural effusion, chest tube placement EXAM: CT CHEST WITHOUT CONTRAST TECHNIQUE: Multidetector CT imaging of the chest was performed following the standard protocol without IV contrast. COMPARISON:  11/23/2020 FINDINGS: Cardiovascular: The heart is unremarkable without pericardial effusion. Unenhanced imaging of the great vessels demonstrates normal caliber of the thoracic  aorta. Mediastinum/Nodes: Mediastinal shift seen previously has resolved. Stable borderline enlarged precarinal lymph node measuring up to 9 mm. No pathologic adenopathy. Thyroid, trachea, and esophagus are unremarkable. Lungs/Pleura: There has been near complete evacuation of the right pleural effusion seen previously, with interval placement of the pigtail drainage catheter in the right posterior costophrenic angle. Trace residual pleural fluid, partially loculated. Small amount of gas within the right hemithorax consistent with indwelling catheter. There is dense consolidation within the right lung, greatest in the medial and lower lobes, compatible with pneumonia. Hypoventilatory changes are seen within the dependent left lower lobe. Upper Abdomen: There is an indeterminate 2.4 cm hypodensity within the right lobe liver, reference image 109/3. The remainder of the upper abdomen is unremarkable. Musculoskeletal: No acute or destructive bony lesions. Moderate mid to lower thoracic spondylosis. Reconstructed images demonstrate no additional findings. IMPRESSION: 1. Near complete evacuation of the right pleural effusion after right-sided pigtail drainage catheter placement. Trace residual effusion is partially loculated. 2. Dense consolidation throughout the right lung, greatest at the right lung base. Findings are consistent with a combination of pneumonia and atelectasis. Follow-up imaging after completion of appropriate medical management is recommended to exclude underlying neoplasm. 3. Indeterminate hypodensity right lobe liver. If further evaluation is desired, dedicated liver CT or MRI may be useful. Electronically Signed   By: Randa Ngo M.D.   On: 11/23/2020 23:12   DG Chest Port 1 View  Result Date: 11/25/2020 CLINICAL DATA:  Right pleural effusion EXAM: PORTABLE CHEST 1 VIEW COMPARISON:  11/24/2020 FINDINGS: Right basilar pigtail chest tube unchanged. Interval development of right pneumothorax  approximately 10 mm. Progression of airspace disease throughout the right lung. Mild to moderate right pleural effusion with mild progression. Mild left lower lobe atelectasis unchanged.  Small left effusion. IMPRESSION: Interval development of right apical pneumothorax approximately 10 mm Progression of right lower lobe airspace disease and right pleural effusion. Electronically Signed   By: Franchot Gallo M.D.   On: 11/25/2020 09:47   DG Chest Port 1 View  Result Date: 11/24/2020 CLINICAL DATA:  Pleural effusion, respiratory failure EXAM: PORTABLE CHEST 1 VIEW COMPARISON:  11/23/2020 FINDINGS: Interval extubation. Pulmonary insufflation has diminished slightly in the interval. Right basilar pigtail chest tube is again identified. Previously noted large right pleural effusion has been largely evacuated though small pleural fluid persists. Right basilar consolidation is noted. Additional airspace infiltrate within the a right upper lobe. Mild left basilar atelectasis. No pneumothorax. Cardiac size within normal limits. IMPRESSION: Interval extubation with slight interval decrease in pulmonary insufflation. Right basilar pigtail chest tube placement with near complete evacuation of right pleural effusion. Persistent right basilar consolidation and right upper lobe airspace infiltrate, likely infectious. Electronically Signed   By: Fidela Salisbury MD   On: 11/24/2020 04:19  LOS: 2 days   Antonieta Pert, MD [facility]  11/25/2020, 1:36 PM

## 2020-11-26 ENCOUNTER — Inpatient Hospital Stay (HOSPITAL_COMMUNITY): Payer: Medicare Other

## 2020-11-26 DIAGNOSIS — J189 Pneumonia, unspecified organism: Secondary | ICD-10-CM | POA: Diagnosis not present

## 2020-11-26 DIAGNOSIS — J90 Pleural effusion, not elsewhere classified: Secondary | ICD-10-CM | POA: Diagnosis not present

## 2020-11-26 LAB — BODY FLUID CULTURE
Culture: NO GROWTH
Gram Stain: NONE SEEN

## 2020-11-26 LAB — BASIC METABOLIC PANEL
Anion gap: 11 (ref 5–15)
BUN: 9 mg/dL (ref 8–23)
CO2: 23 mmol/L (ref 22–32)
Calcium: 8.4 mg/dL — ABNORMAL LOW (ref 8.9–10.3)
Chloride: 100 mmol/L (ref 98–111)
Creatinine, Ser: 0.64 mg/dL (ref 0.44–1.00)
GFR, Estimated: >60 mL/min (ref >60)
Glucose, Bld: 101 mg/dL — ABNORMAL HIGH (ref 70–99)
Potassium: 3.6 mmol/L (ref 3.5–5.1)
Sodium: 134 mmol/L — ABNORMAL LOW (ref 135–145)

## 2020-11-26 LAB — CBC
HCT: 35 % — ABNORMAL LOW (ref 36.0–46.0)
Hemoglobin: 11.4 g/dL — ABNORMAL LOW (ref 12.0–15.0)
MCH: 27.3 pg (ref 26.0–34.0)
MCHC: 32.6 g/dL (ref 30.0–36.0)
MCV: 83.7 fL (ref 80.0–100.0)
Platelets: 272 10*3/uL (ref 150–400)
RBC: 4.18 MIL/uL (ref 3.87–5.11)
RDW: 15.4 % (ref 11.5–15.5)
WBC: 12.8 10*3/uL — ABNORMAL HIGH (ref 4.0–10.5)
nRBC: 0 % (ref 0.0–0.2)

## 2020-11-26 LAB — GLUCOSE, CAPILLARY
Glucose-Capillary: 102 mg/dL — ABNORMAL HIGH (ref 70–99)
Glucose-Capillary: 109 mg/dL — ABNORMAL HIGH (ref 70–99)
Glucose-Capillary: 106 mg/dL — ABNORMAL HIGH (ref 70–99)
Glucose-Capillary: 103 mg/dL — ABNORMAL HIGH (ref 70–99)

## 2020-11-26 LAB — PROCALCITONIN: Procalcitonin: 0.58 ng/mL

## 2020-11-26 MED ORDER — GUAIFENESIN 100 MG/5ML PO SOLN
10.0000 mL | ORAL | Status: DC | PRN
  Administered 2020-11-26 – 2020-11-29 (×5): 200 mg via ORAL
  Filled 2020-11-26 (×6): qty 10

## 2020-11-26 MED ORDER — MELATONIN 3 MG PO TABS
3.0000 mg | ORAL_TABLET | Freq: Every evening | ORAL | Status: DC | PRN
  Administered 2020-11-26 – 2020-11-29 (×4): 3 mg via ORAL
  Filled 2020-11-26 (×4): qty 1

## 2020-11-26 NOTE — Progress Notes (Signed)
Physical Therapy Treatment Patient Details Name: Yvette Roberts MRN: [id number] DOB: 1955-12-19 Today's Date: 11/26/2020    History of Present Illness 65 yo admitted 1/27 with respiratory failure with intubation/extubation same date. Pigtail catheter placed 1/27 for right pleural effusion; changed to a chest tube on 1/28; PMhx: depression, R frozen shoulder, DM    PT Comments    Continuing work on functional mobility and activity tolerance;  Able to walk further on less supplemental O2 than last session (3 L compared to 6L), and did not need to titrate up with activity; Highlighted her progress to the patient, and she seemed pleased; Will consider O2 qualifying walk next PT session  Follow Up Recommendations  Outpatient PT     Equipment Recommendations  Rolling walker with 5" wheels (vs Rollator RW)    Recommendations for Other Services       Precautions / Restrictions Precautions Precautions: Fall Precaution Comments: watch sats, chest tube    Mobility  Bed Mobility Overal bed mobility: Needs Assistance Bed Mobility: Supine to Sit     Supine to sit: HOB elevated;Min guard     General bed mobility comments: Needed less assist to sit up on L side of bed; cues for technique, and to initiate; assist mostly for chest tube line management  Transfers Overall transfer level: Needs assistance Equipment used: None Transfers: Sit to/from Stand Sit to Stand: Min assist (HHA)         General transfer comment: Min assist to stabilize; noted slight tendency to brace LEs against bed  Ambulation/Gait Ambulation/Gait assistance: Min guard Gait Distance (Feet): 80 Feet Assistive device:  (pushed Dinamap) Gait Pattern/deviations: Step-through pattern;Decreased stride length     General Gait Details: pt with slight unsteadiness with need for bilat UE support for gait and assist to manage lines. Pt on 3L for gait with SpO2 99-94%   Stairs             Wheelchair  Mobility    Modified Rankin (Stroke Patients Only)       Balance     Sitting balance-Leahy Scale: Good       Standing balance-Leahy Scale: Fair                              Cognition Arousal/Alertness: Awake/alert Behavior During Therapy: WFL for tasks assessed/performed Overall Cognitive Status: Within Functional Limits for tasks assessed                                        Exercises      General Comments General comments (skin integrity, edema, etc.): Entire session conducted on 3L supplemental O2 via [place]; O2 sats decr to 88% observed lowest, and incr back to 90% or more with focused breathing -- did not need to titrate O2 up      Pertinent Vitals/Pain Pain Assessment: No/denies pain Faces Pain Scale: Hurts a little bit Pain Location: Chest tube site with coughing; pt reports it's not pain, but maybe exertion Pain Intervention(s): Monitored during session    Home Living                      Prior Function            PT Goals (current goals can now be found in the care plan section) Acute Rehab PT Goals Patient Stated  Goal: return home and to work PT Goal Formulation: With patient Time For Goal Achievement: 12/08/20 Potential to Achieve Goals: Good Progress towards PT goals: Progressing toward goals    Frequency    Min 3X/week      PT Plan Current plan remains appropriate;Equipment recommendations need to be updated (will consider a RW/Rollator RW)    Co-evaluation              AM-PAC PT "6 Clicks" Mobility   Outcome Measure  Help needed turning from your back to your side while in a flat bed without using bedrails?: A Little Help needed moving from lying on your back to sitting on the side of a flat bed without using bedrails?: A Little Help needed moving to and from a bed to a chair (including a wheelchair)?: A Little Help needed standing up from a chair using your arms (e.g., wheelchair or bedside  chair)?: A Little Help needed to walk in hospital room?: A Little Help needed climbing 3-5 steps with a railing? : A Little 6 Click Score: 18    End of Session Equipment Utilized During Treatment: Oxygen Activity Tolerance: Patient tolerated treatment well Patient left: in chair;with call bell/phone within reach Nurse Communication: Mobility status PT Visit Diagnosis: Other abnormalities of gait and mobility (R26.89);Difficulty in walking, not elsewhere classified (R26.2)     Time: 0240-9735 PT Time Calculation (min) (ACUTE ONLY): 35 min  Charges:  $Gait Training: 23-37 mins                     Roney [place], [place]  Acute Rehabilitation Services Pager [phone] Office [place] 11/26/2020, 10:54 AM

## 2020-11-26 NOTE — Progress Notes (Signed)
PROGRESS NOTE    LAILA MYHRE  PXT:[id number] DOB: 1956/06/14 DOA: 11/23/2020 PCP: Pcp, No   Chief Complaint  Patient presents with  . Respiratory Distress  Brief Narrative: 65 year old female morbidly obese with type 2 diabetes, depression arthritis/frozen shoulder, GERD, resistant hypertension presented to the ED on 11/23/2020 with shortness of breath, EMS was summoned and on arrival was alert and awake but en route to the ED dyspnea worsened with increasing oxygen requirement placed on nonrebreather and in the ED urgently intubated. Patient was admitted to ICU found to have large exudative pleural effusion requiring chest tube drainage. Chest tube placed 1/27, extubated self 1/27.  1/29 transfer to floor bed CT chest 1/27 >> large Rt effusion with collapse of Rt lung Rt pleural fluid 1/27 >> protein 5.3, LDH 1523, WBC 2280 (67% neutrophils) CT chest 1/27 >> consolidation of Rt lung most in middle and lower lobes 1/29-chest x-ray with right apical pneumothorax 10 mm, seen by Dr. Halford Chessman and cest tube palced on suction  Subjective: Patient is on the bedside chair.  She feels better today. Afebrile overnight, on 3 L nasal cannula WBC significantly improved  Assessment & Plan:  Acute hypoxemic respiratory failure needing intubation, self extubated 1/27.  On 3 L nasal cannula.  Continue the same.  Continue to address pleural effusion and pneumonia.   Exudative pleural effusion/community-acquired bacterial pneumonia involving RML and RLL/Rt apical pneumothorax ( new on 1/29): Adjust antibiotic regimen to ceftriaxone/Flagyl on 1.29.22, already received azithromycin and doxycycline.Blood culture no growth so far.  Pleural fluid Gram stain no organisms and pleural fluid culture pending, chest tube site small apical pneumothorax and placed back on suction by pulmonary. cxr 1/30-stable small apical pneumo, small to moderate right pleural effusion- see cxr report.  Leukocytosis and procalcitonin  improving.  Chest x-ray in a.m., monitor chest tube output. plan defer to pulmonary  Severe sepsis present on admission with leukocytosis 16.5, tachypnea, hypoxia, AKI: Sepsis parameters resolved.  Leukocytosis and procalcitonin appropriately decreasing in antibiotic regimen adjusted 1/29 Recent Labs  Lab 11/23/20 0324 11/23/20 0849 11/24/20 0156 11/25/20 0036 11/25/20 1123 11/26/20 0143  WBC 16.5*  --  24.7* 17.8*  --  12.8*  PROCALCITON  --  1.76  --   --  0.63 0.58   Hypertension BP well controlled, continue to hold Norvasc Cozaar Aldactone.  History of depression continue Prozac  Prolonged QTC on EKG on 12/27, resolved to 447 on 1/29 EKG  Type 2 diabetes mellitus without complication: Well-controlled, stable hemoglobin A1c 6.3 11/23/20. Cont asa.  Continue sliding scale Recent Labs  Lab 11/25/20 0756 11/25/20 1147 11/25/20 1705 11/25/20 2115 11/26/20 0746  GLUCAP 148* 99 126* 101* 102*   Hyponatremia;sodium mildly low, monitor  GERD: Continue PPI  Morbid obesity BMI 35.5 she will benefit with weight loss/lifestyle.  Insomnia cont prn bedtime trazodone per request  Nutrition: Diet Order            Diet Carb Modified Fluid consistency: Thin; Room service appropriate? Yes  Diet effective now                 Nutrition Problem: Inadequate oral intake Etiology: inability to eat Signs/Symptoms: NPO status Interventions: Tube feeding  Body mass index is 34.52 kg/m.  DVT prophylaxis: heparin injection 5,000 Units Start: 11/23/20 1400 SCDs Start: 11/23/20 6270 Code Status:   Code Status: Full Code Family Communication: plan of care discussed with patient at bedside.  Status is: Inpatient Remains inpatient appropriate because:IV treatments appropriate due to intensity  of illness or inability to take PO and Inpatient level of care appropriate due to severity of illness  Dispo: The patient is from: Home              Anticipated d/c is to: Home               Anticipated d/c date is: 3 days              Patient currently is not medically stable to d/c.   Difficult to place patient No  Consultants:see note  Procedures:see note  Culture/Microbiology    Component Value Date/Time   SDES BLOOD RIGHT HAND 11/23/2020 0905   SPECREQUEST  11/23/2020 0905    BOTTLES DRAWN AEROBIC AND ANAEROBIC Blood Culture adequate volume   CULT  11/23/2020 0905    NO GROWTH 3 DAYS Performed at [facility], [place] [address], [place], [place] [address]    REPTSTATUS PENDING 11/23/2020 4268    Other culture-see note  Medications: Scheduled Meds: . aspirin  81 mg Oral Daily  . Chlorhexidine Gluconate Cloth  6 each Topical Daily  . cholecalciferol  2,000 Units Oral Daily  . docusate  100 mg Per Tube BID  . FLUoxetine  40 mg Oral Daily  . heparin  5,000 Units Subcutaneous Q8H  . insulin aspart  0-15 Units Subcutaneous TID WC  . insulin aspart  0-5 Units Subcutaneous QHS  . lidocaine  1 patch Transdermal Q24H  . metroNIDAZOLE  500 mg Oral Q8H  . pantoprazole  40 mg Oral Daily  . sodium chloride flush  10 mL Intracatheter Q8H   Continuous Infusions: . sodium chloride 50 mL/hr at 11/24/20 1555  . cefTRIAXone (ROCEPHIN)  IV 2 g (11/25/20 1320)    Antimicrobials: Anti-infectives (From admission, onward)   Start     Dose/Rate Route Frequency Ordered Stop   11/25/20 1400  metroNIDAZOLE (FLAGYL) tablet 500 mg        500 mg Oral Every 8 hours 11/25/20 1148     11/25/20 1300  cefTRIAXone (ROCEPHIN) 2 g in sodium chloride 0.9 % 100 mL IVPB        2 g 200 mL/hr over 30 Minutes Intravenous Every 24 hours 11/25/20 1148     11/24/20 1800  azithromycin (ZITHROMAX) tablet 500 mg  Status:  Discontinued        500 mg Oral Daily 11/24/20 1156 11/24/20 1212   11/24/20 1300  azithromycin (ZITHROMAX) tablet 500 mg  Status:  Discontinued        500 mg Oral Daily 11/24/20 1212 11/25/20 1148   11/24/20 1000  doxycycline (VIBRA-TABS) tablet 100 mg  Status:  Discontinued         100 mg Oral Every 12 hours 11/24/20 0913 11/25/20 1148     Objective: Vitals: Today's Vitals   11/25/20 0830 11/25/20 1457 11/25/20 2115 11/26/20 0552  BP:  112/72 130/62 130/65  Pulse:  95 94 85  Resp:  18 19 20   Temp:  98.4 F (36.9 C) 98.5 F (36.9 C) 98.3 F (36.8 C)  TempSrc:    Oral  SpO2:  93% 95% 94%  Weight:      Height:      PainSc: 2   8      Intake/Output Summary (Last 24 hours) at 11/26/2020 0902 Last data filed at 11/25/2020 2100 Gross per 24 hour  Intake 640 ml  Output 130 ml  Net 510 ml   Filed Weights   11/23/20 0322 11/23/20  1607 11/24/20 0500  Weight: 111 kg 103.6 kg 97 kg   Weight change:   Intake/Output from previous day: 01/29 0701 - 01/30 0700 In: 640 [P.O.:120; I.V.:400; IV Piggyback:100] Out: 130 [Chest Tube:130] Intake/Output this shift: No intake/output data recorded. Filed Weights   11/23/20 0322 11/23/20 0826 11/24/20 0500  Weight: 111 kg 103.6 kg 97 kg    Examination: General exam: AAOx3 , NAD, weak appearing. HEENT:Oral mucosa moist, Ear/Nose WNL grossly, dentition normal. Respiratory system: Decreased breath sounds on both lungs more on the right base, right-sided pigtail tube+ Cardiovascular system: S1 & S2 +, No JVD,. Gastrointestinal system: Abdomen soft, NT,ND, BS+ Nervous System:Alert, awake, moving extremities and grossly nonfocal Extremities: No edema, distal peripheral pulses palpable.  Skin: No rashes,no icterus. MSK: Normal muscle bulk,tone, power   Data Reviewed: I have personally reviewed following labs and imaging studies CBC: Recent Labs  Lab 11/23/20 0324 11/23/20 0421 11/23/20 0525 11/24/20 0156 11/25/20 0036 11/26/20 0143  WBC 16.5*  --   --  24.7* 17.8* 12.8*  NEUTROABS 10.7*  --   --   --   --   --   HGB 14.4 14.3 15.6* 13.3 11.9* 11.4*  HCT 45.3 42.0 46.0 39.4 36.6 35.0*  MCV 86.0  --   --  82.9 84.1 83.7  PLT 474*  --   --  368 306 371   Basic Metabolic Panel: Recent Labs  Lab  11/23/20 0324 11/23/20 0421 11/23/20 0525 11/24/20 0156 11/25/20 0036 11/26/20 0143  NA 132* 131* 133* 134* 133* 134*  K 4.5 4.0 4.4 5.2* 4.0 3.6  CL 96*  --   --  99 101 100  CO2 22  --   --  25 21* 23  GLUCOSE 212*  --   --  95 111* 101*  BUN 21  --   --  23 12 9   CREATININE 1.28*  --   --  0.94 0.72 0.64  CALCIUM 9.5  --   --  9.0 8.5* 8.4*  MG 2.6*  --   --  2.5*  --   --    GFR: Estimated Creatinine Clearance: 82.3 mL/min (by C-G formula based on SCr of 0.64 mg/dL). Liver Function Tests: Recent Labs  Lab 11/23/20 0324 11/23/20 0849  AST 23  --   ALT 23  --   ALKPHOS 63  --   BILITOT 0.9  --   PROT 8.2* 7.0  ALBUMIN 3.4*  --    No results for input(s): LIPASE, AMYLASE in the last 168 hours. No results for input(s): AMMONIA in the last 168 hours. Coagulation Profile: No results for input(s): INR, PROTIME in the last 168 hours. Cardiac Enzymes: No results for input(s): CKTOTAL, CKMB, CKMBINDEX, TROPONINI in the last 168 hours. BNP (last 3 results) No results for input(s): PROBNP in the last 8760 hours. HbA1C: No results for input(s): HGBA1C in the last 72 hours. CBG: Recent Labs  Lab 11/25/20 0756 11/25/20 1147 11/25/20 1705 11/25/20 2115 11/26/20 0746  GLUCAP 148* 99 126* 101* 102*   Lipid Profile: No results for input(s): CHOL, HDL, LDLCALC, TRIG, CHOLHDL, LDLDIRECT in the last 72 hours. Thyroid Function Tests: No results for input(s): TSH, T4TOTAL, FREET4, T3FREE, THYROIDAB in the last 72 hours. Anemia Panel: No results for input(s): VITAMINB12, FOLATE, FERRITIN, TIBC, IRON, RETICCTPCT in the last 72 hours. Sepsis Labs: Recent Labs  Lab 11/23/20 0849 11/25/20 1123 11/26/20 0143  PROCALCITON 1.76 0.63 0.58    Recent Results (from the past 240  hour(s))  SARS Coronavirus 2 by RT PCR (hospital order, performed in [facility] hospital lab) Nasopharyngeal Nasopharyngeal Swab     Status: None   Collection Time: 11/23/20  3:06 AM   Specimen:  Nasopharyngeal Swab  Result Value Ref Range Status   SARS Coronavirus 2 NEGATIVE NEGATIVE Final    Comment: (NOTE) SARS-CoV-2 target nucleic acids are NOT DETECTED.  The SARS-CoV-2 RNA is generally detectable in upper and lower respiratory specimens during the acute phase of infection. The lowest concentration of SARS-CoV-2 viral copies this assay can detect is 250 copies / mL. A negative result does not preclude SARS-CoV-2 infection and should not be used as the sole basis for treatment or other patient management decisions.  A negative result may occur with improper specimen collection / handling, submission of specimen other than nasopharyngeal swab, presence of viral mutation(s) within the areas targeted by this assay, and inadequate number of viral copies (<250 copies / mL). A negative result must be combined with clinical observations, patient history, and epidemiological information.  Fact Sheet for Patients:   StrictlyIdeas.no  Fact Sheet for Healthcare Providers: BankingDealers.co.za  This test is not yet approved or  cleared by the [place] FDA and has been authorized for detection and/or diagnosis of SARS-CoV-2 by FDA under an Emergency Use Authorization (EUA).  This EUA will remain in effect (meaning this test can be used) for the duration of the COVID-19 declaration under Section 564(b)(1) of the Act, 21 U.S.C. section 360bbb-3(b)(1), unless the authorization is terminated or revoked sooner.  Performed at [facility], [place] [address], [place], [place] [address]   Body fluid culture (includes gram stain)     Status: None (Preliminary result)   Collection Time: 11/23/20  7:49 AM   Specimen: Pleural Fluid  Result Value Ref Range Status   Specimen Description PLEURAL  Final   Special Requests NONE  Final   Gram Stain NO WBC SEEN NO ORGANISMS SEEN   Final   Culture   Final    NO GROWTH 2 DAYS Performed at  [facility], [place] [address], [place], [place] [address]    Report Status PENDING  Incomplete  Culture, blood (routine x 2)     Status: None (Preliminary result)   Collection Time: 11/23/20  8:49 AM   Specimen: BLOOD  Result Value Ref Range Status   Specimen Description BLOOD LEFT ANTECUBITAL  Final   Special Requests   Final    BOTTLES DRAWN AEROBIC AND ANAEROBIC Blood Culture results may not be optimal due to an inadequate volume of blood received in culture bottles   Culture   Final    NO GROWTH 3 DAYS Performed at [facility], [place] [address] [place], [place] [address]    Report Status PENDING  Incomplete  Culture, blood (routine x 2)     Status: None (Preliminary result)   Collection Time: 11/23/20  9:05 AM   Specimen: BLOOD RIGHT HAND  Result Value Ref Range Status   Specimen Description BLOOD RIGHT HAND  Final   Special Requests   Final    BOTTLES DRAWN AEROBIC AND ANAEROBIC Blood Culture adequate volume   Culture   Final    NO GROWTH 3 DAYS Performed at [facility], [place] [address] [place], [place] [address]    Report Status PENDING  Incomplete  MRSA PCR Screening     Status: None   Collection Time: 11/23/20  9:14 AM   Specimen:  Nasal Mucosa; Nasopharyngeal  Result Value Ref Range Status   MRSA by PCR NEGATIVE NEGATIVE Final    Comment:        The GeneXpert MRSA Assay (FDA approved for NASAL specimens only), is one component of a comprehensive MRSA colonization surveillance program. It is not intended to diagnose MRSA infection nor to guide or monitor treatment for MRSA infections. Performed at [facility], [place] [address], [place], [place] [address]      Radiology Studies: DG Chest Port 1 View  Result Date: 11/26/2020 CLINICAL DATA:  Right pleural effusion EXAM: PORTABLE CHEST 1 VIEW COMPARISON:  Chest x-ray 11/25/2020, CT chest 11/23/2020 FINDINGS: Right chest tube pigtail overlying the right base unchanged. The heart size and  mediastinal contours are unchanged. Right lung airspace disease again noted. Left base streaky airspace opacity likely representing atelectasis. Redemonstration of small right apical pneumothorax as well as small to moderate right pleural effusion. Suggestion of a trace to small volume left pleural effusion. No pneumothorax on the left. No acute osseous abnormality. IMPRESSION: 1. Stable small right apical pneumothorax and small to moderate right pleural effusion. Unchanged position of a right chest tube pigtail. Persistent underlying right lung airspace disease. Followup PA and lateral chest X-ray is recommended in 3-4 weeks to ensure resolution and exclude underlying malignancy. 2. Persistent trace to small volume left pleural effusion. Underlying atelectasis with superimposed infection/inflammation not excluded. Electronically Signed   By: Iven Finn M.D.   On: 11/26/2020 05:50   DG Chest Port 1 View  Result Date: 11/25/2020 CLINICAL DATA:  Right pleural effusion EXAM: PORTABLE CHEST 1 VIEW COMPARISON:  11/24/2020 FINDINGS: Right basilar pigtail chest tube unchanged. Interval development of right pneumothorax approximately 10 mm. Progression of airspace disease throughout the right lung. Mild to moderate right pleural effusion with mild progression. Mild left lower lobe atelectasis unchanged.  Small left effusion. IMPRESSION: Interval development of right apical pneumothorax approximately 10 mm Progression of right lower lobe airspace disease and right pleural effusion. Electronically Signed   By: Franchot Gallo M.D.   On: 11/25/2020 09:47     LOS: 3 days   Antonieta Pert, MD [facility]  11/26/2020, 9:02 AM

## 2020-11-26 NOTE — Progress Notes (Signed)
NAME:  Yvette Roberts, MRN:  [id number], DOB:  [birth year], LOS: 3 ADMISSION DATE:  11/23/2020, CONSULTATION DATE:  11/23/2020 REFERRING MD:  Dr. Roxanne Mins, ER, CHIEF COMPLAINT:  Short of breath   Brief History:  65 yo female presented with progressive dyspnea.  Found to have large exudate pleural effusion requiring chest tube drainage.  Past Medical History:  DM type 2, HTN, GERD, Depression, OA, Allergies  Significant Hospital Events:  1/27 admit, chest tube placed 1/28 transfer to floor bed 1/29 chest tube to -20 suction  Consults:    Procedures:  ETT 1/27 >> 1/27 (self extubated) Rt pig tail catheter 1/27 >>   Significant Diagnostic Tests:  CT chest 1/27 >> large Rt effusion with collapse of Rt lung Rt pleural fluid 1/27 >> protein 5.3, LDH 1523, WBC 2280 (67% neutrophils) CT chest 1/27 >> consolidation of Rt lung most in middle and lower lobes  Micro Data:  COVID 1/27 >> negative MRSA PCR 1/27 >> negative Blood 1/27 >> Rt pleural fluid 1/27 >>  Antimicrobials:  Doxycycline 1/28 >> 1/29 Zithromax 1/28 >> 1/29 Rocephin 1/29 >> Flagyl 1/29 >>  Interim History / Subjective:  Feels like she improving.  Still coughing up sputum.  Chest sore when she lays flat.    Objective   Blood pressure 130/65, pulse 85, temperature 98.3 F (36.8 C), temperature source Oral, resp. rate 20, height 5\' 6"  (1.676 m), weight 97 kg, SpO2 94 %.        Intake/Output Summary (Last 24 hours) at 11/26/2020 1102 Last data filed at 11/25/2020 2100 Gross per 24 hour  Intake 520 ml  Output 130 ml  Net 390 ml   Filed Weights   11/23/20 0322 11/23/20 0826 11/24/20 0500  Weight: 111 kg 103.6 kg 97 kg    Examination:  General - alert, sitting in chair Eyes - pupils reactive ENT - no sinus tenderness, no stridor Cardiac - regular rate/rhythm, no murmur Chest - decreased BS at bases Rt > Lt, Rt pig tail in w/o air leak Abdomen - soft, non tender, + bowel sounds Extremities - no cyanosis,  clubbing, or edema Skin - no rashes Neuro - normal strength, moves extremities, follows commands Psych - normal mood and behavior   Discussion:  Had 3600 ml of fluid out on day one after pig tail placed.  About 100 ml out per day since 1/28.  Radiology reports reading small apical pneumothorax and effusion.  My view of chest xray looks more like RML and RLL infiltrate (better seen on CT chest) and smaller effusion, and less clear whether she still has pneumothorax.  Assessment & Plan:   Community acquired bacterial pneumonia involving RML and RLL. Rt parapneumonic effusion s/p chest tube insertion. - day 3 of ABx - continue chest tube to -20 cm suction - f/u CXR in 1/31 - f/u pleural fluid culture and cytology from 1/27 - flush chest tube per protocol  Hx of HTN. Hx of depression. DM type II. - per primary team  Best practice (evaluated daily)  Diet: carb modified DVT prophylaxis: SQ heparin GI prophylaxis: protonix Mobility: as tolerated Disposition: med-surg Code Status: full code  Labs    CMP Latest Ref Rng & Units 11/26/2020 11/25/2020 11/24/2020  Glucose 70 - 99 mg/dL 101(H) 111(H) 95  BUN 8 - 23 mg/dL 9 12 23   Creatinine 0.44 - 1.00 mg/dL 0.64 0.72 0.94  Sodium 135 - 145 mmol/L 134(L) 133(L) 134(L)  Potassium 3.5 - 5.1 mmol/L 3.6 4.0 5.2(H)  Chloride 98 - 111 mmol/L 100 101 99  CO2 22 - 32 mmol/L 23 21(L) 25  Calcium 8.9 - 10.3 mg/dL 8.4(L) 8.5(L) 9.0  Total Protein 6.5 - 8.1 g/dL - - -  Total Bilirubin 0.3 - 1.2 mg/dL - - -  Alkaline Phos 38 - 126 U/L - - -  AST 15 - 41 U/L - - -  ALT 0 - 44 U/L - - -    CBC Latest Ref Rng & Units 11/26/2020 11/25/2020 11/24/2020  WBC 4.0 - 10.5 K/uL 12.8(H) 17.8(H) 24.7(H)  Hemoglobin 12.0 - 15.0 g/dL 11.4(L) 11.9(L) 13.3  Hematocrit 36.0 - 46.0 % 35.0(L) 36.6 39.4  Platelets 150 - 400 K/uL 272 306 368    ABG    Component Value Date/Time   PHART 7.324 (L) 11/23/2020 0421   PCO2ART 46.0 11/23/2020 0421   PO2ART 283 (H)  11/23/2020 0421   HCO3 26.4 11/23/2020 0525   TCO2 29 11/23/2020 0525   ACIDBASEDEF 6.0 (H) 11/23/2020 0525   O2SAT 99.0 11/23/2020 0525    CBG (last 3)  Recent Labs    11/25/20 1705 11/25/20 2115 11/26/20 0746  GLUCAP 126* 101* 102*    Signature:  Chesley Mires, MD [place] Pager - [phone] 11/26/2020, 11:02 AM

## 2020-11-27 ENCOUNTER — Inpatient Hospital Stay (HOSPITAL_COMMUNITY): Payer: Medicare Other

## 2020-11-27 DIAGNOSIS — J918 Pleural effusion in other conditions classified elsewhere: Secondary | ICD-10-CM | POA: Diagnosis not present

## 2020-11-27 DIAGNOSIS — J189 Pneumonia, unspecified organism: Secondary | ICD-10-CM | POA: Diagnosis not present

## 2020-11-27 DIAGNOSIS — J9601 Acute respiratory failure with hypoxia: Secondary | ICD-10-CM | POA: Diagnosis not present

## 2020-11-27 LAB — CBC
HCT: 30.7 % — ABNORMAL LOW (ref 36.0–46.0)
Hemoglobin: 10.5 g/dL — ABNORMAL LOW (ref 12.0–15.0)
MCH: 28.2 pg (ref 26.0–34.0)
MCHC: 34.2 g/dL (ref 30.0–36.0)
MCV: 82.3 fL (ref 80.0–100.0)
Platelets: 252 10*3/uL (ref 150–400)
RBC: 3.73 MIL/uL — ABNORMAL LOW (ref 3.87–5.11)
RDW: 15.7 % — ABNORMAL HIGH (ref 11.5–15.5)
WBC: 8.6 10*3/uL (ref 4.0–10.5)
nRBC: 0 % (ref 0.0–0.2)

## 2020-11-27 LAB — BASIC METABOLIC PANEL
Anion gap: 9 (ref 5–15)
BUN: 6 mg/dL — ABNORMAL LOW (ref 8–23)
CO2: 23 mmol/L (ref 22–32)
Calcium: 8.2 mg/dL — ABNORMAL LOW (ref 8.9–10.3)
Chloride: 103 mmol/L (ref 98–111)
Creatinine, Ser: 0.58 mg/dL (ref 0.44–1.00)
GFR, Estimated: >60 mL/min (ref >60)
Glucose, Bld: 97 mg/dL (ref 70–99)
Potassium: 3.2 mmol/L — ABNORMAL LOW (ref 3.5–5.1)
Sodium: 135 mmol/L (ref 135–145)

## 2020-11-27 LAB — PROCALCITONIN: Procalcitonin: 0.33 ng/mL

## 2020-11-27 LAB — GLUCOSE, CAPILLARY
Glucose-Capillary: 100 mg/dL — ABNORMAL HIGH (ref 70–99)
Glucose-Capillary: 109 mg/dL — ABNORMAL HIGH (ref 70–99)
Glucose-Capillary: 94 mg/dL (ref 70–99)
Glucose-Capillary: 122 mg/dL — ABNORMAL HIGH (ref 70–99)

## 2020-11-27 MED ORDER — LOPERAMIDE HCL 2 MG PO CAPS
2.0000 mg | ORAL_CAPSULE | ORAL | Status: DC | PRN
  Administered 2020-11-27: 2 mg via ORAL
  Filled 2020-11-27: qty 1

## 2020-11-27 MED ORDER — POTASSIUM CHLORIDE CRYS ER 20 MEQ PO TBCR
40.0000 meq | EXTENDED_RELEASE_TABLET | Freq: Once | ORAL | Status: AC
  Administered 2020-11-27: 40 meq via ORAL
  Filled 2020-11-27: qty 2

## 2020-11-27 MED ORDER — ENSURE ENLIVE PO LIQD
237.0000 mL | Freq: Three times a day (TID) | ORAL | Status: DC
  Administered 2020-11-27: 237 mL via ORAL

## 2020-11-27 MED ORDER — ADULT MULTIVITAMIN W/MINERALS CH
1.0000 | ORAL_TABLET | Freq: Every day | ORAL | Status: DC
  Administered 2020-11-27 – 2020-11-30 (×4): 1 via ORAL
  Filled 2020-11-27 (×4): qty 1

## 2020-11-27 NOTE — Progress Notes (Signed)
Nutrition Follow-up  DOCUMENTATION CODES:   Obesity unspecified  INTERVENTION:   -Ensure Enlive po TID, each supplement provides 350 kcal and 20 grams of protein -MVI with minerals daily  NUTRITION DIAGNOSIS:   Inadequate oral intake related to inability to eat as evidenced by NPO status.  Progressing; advanced to PO diet on 11/24/20  GOAL:   Patient will meet greater than or equal to 90% of their needs  Progressing   MONITOR:   PO intake,Supplement acceptance,Labs,Weight trends,Skin,I & O's  ASSESSMENT:   65 yo female admitted with SOB, pleural effusion, urgently intubated in the ED. PMH includes GERD, obesity, HTN, DM-2.  1/27- rt chest tube placed, extubated 1/28- s/p BSE- advanced to regular consistency diet with thin liquids  Reviewed I/O's: +2 L x 24 hours and +947 ml since admission  Chest tube output: 0 ml x 24 hours  Pt unavailable at time of attempted contact.   Pt remains with chest tube tube; plan for chest x-ray this AM. Per chart review, output has improved- usually less than 100 ml per day.   Pt with poor oral intake. Noted meal completions 0-40%.   Medications reviewed and include colace.   Pt with poor oral intake and would benefit from nutrient dense supplement. One Ensure Enlive supplement provides 350 kcals, 20 grams protein, and 44-45 grams of carbohydrate vs one Glucerna shake supplement, which provides 220 kcals, 10 grams of protein, and 26 grams of carbohydrate. Given pt's hx of DM, RD will reassess adequacy of PO intake, CBGS, and adjust supplement regimen as appropriate at follow-up.   Obesity is a complex, chronic medical condition that is optimally managed by a multidisciplinary care team. Weight loss is not an ideal goal for an acute inpatient hospitalization. However, if further work-up for obesity is warranted, consider outpatient referral to outpatient bariatric service and/or 's Nutrition and Diabetes Education Services.    Labs reviewed: K: 3.2, CBGS: 100-106 (inpatient orders for glycemic control are 0-15 units insulin aspart TID with meals and 0-5 units insulin aspart daily at bedtime).   Diet Order:   Diet Order            Diet Carb Modified Fluid consistency: Thin; Room service appropriate? Yes  Diet effective now                 EDUCATION NEEDS:   No education needs have been identified at this time  Skin:  Skin Assessment: Reviewed RN Assessment  Last BM:  11/27/20  Height:   Ht Readings from Last 1 Encounters:  11/23/20 5\' 6"  (1.676 m)    Weight:   Wt Readings from Last 1 Encounters:  11/24/20 97 kg    Ideal Body Weight:  59.1 kg  BMI:  Body mass index is 34.52 kg/m.  Estimated Nutritional Needs:   Kcal:  2050-2250  Protein:  105-120 grams  Fluid:  > 2 L    Loistine Chance, RD, LDN, [place] Registered Dietitian II Certified Diabetes Care and Education Specialist Please refer to [facility] for RD and/or RD on-call/weekend/after hours pager

## 2020-11-27 NOTE — Progress Notes (Signed)
PROGRESS NOTE    Yvette Roberts  TIW:[id number] DOB: 1956/02/23 DOA: 11/23/2020 PCP: Pcp, No   Chief Complaint  Patient presents with  . Respiratory Distress  Brief Narrative: 65 year old female morbidly obese with type 2 diabetes, depression arthritis/frozen shoulder, GERD, resistant hypertension presented to the ED on 11/23/2020 with shortness of breath, EMS was summoned and on arrival was alert and awake but en route to the ED dyspnea worsened with increasing oxygen requirement placed on nonrebreather and in the ED urgently intubated. Patient was admitted to ICU found to have large exudative pleural effusion requiring chest tube drainage. Chest tube placed 1/27, extubated self 1/27.  1/29 transfer to floor bed CT chest 1/27 >> large Rt effusion with collapse of Rt lung Rt pleural fluid 1/27 >> protein 5.3, LDH 1523, WBC 2280 (67% neutrophils) CT chest 1/27 >> consolidation of Rt lung most in middle and lower lobes 1/29-chest x-ray with right apical pneumothorax 10 mm, seen by Dr. Halford Chessman and cHest tube palced on suction  Subjective: Resting comfortably on the bedside chair.  Waterseal taken off from suction this morning Afebrile overnight remains on nasal cannula oxygen 3-4 L, potassium low Procol decreasing  Reporting some diarrhea but has been on Colace Leukocytosis resolved  Assessment & Plan:  Acute hypoxemic respiratory failure needing intubation on admission secondary to pneumonia/pleural effusion-self extubated 1/27.  Continue nasal cannula oxygen and wean as tolerated.  Increase activity, continue incentive spirometry  Exudative pleural effusion/community-acquired bacterial pneumonia involving RML and RLL/Rt apical pneumothorax ( new on 1/29): Adjusted antibiotic regimen to ceftriaxone/Flagyl on 1.29.22, already received azithromycin and doxycycline.blood culture no growth so far, pleural fluid culture no growth.  Pulmonary following closely with chest x-ray daily at this time  right apical pneumothorax and chest tube is on suction.chest tube output significantly decreased initially 3.6 L now at home 130  Ml/24 hr-taken off suction this morning and anticipating removal if no water leak 2/1.  Continue antibiotics to complete 10 to 14 days course.  Severe sepsis POA with leukocytosis 16.5, tachypnea, hypoxia, AKI: Sepsis parameters resolved.  Leukocytosis and procalcitonin nicely downtrending after adjusting antibiotic regimen 1/29. See #2 Recent Labs  Lab 11/23/20 0324 11/23/20 0849 11/24/20 0156 11/25/20 0036 11/25/20 1123 11/26/20 0143 11/27/20 0243  WBC 16.5*  --  24.7* 17.8*  --  12.8* 8.6  PROCALCITON  --  1.76  --   --  0.63 0.58 0.33   Hypertension BP well controlled, continue to hold Norvasc Cozaar Aldactone.  History of depression mood is stable, continue Prozac  Prolonged QTC on EKG on 12/27, resolved to 447 on 1/29 EKG  Type 2 diabetes mellitus without complication: Well-controlled, stable hemoglobin A1c 6.3 11/23/20. Cont asa.  Keep on sliding scale and monitor CBG. Recent Labs  Lab 11/25/20 2115 11/26/20 0746 11/26/20 1223 11/26/20 1722 11/26/20 1959  GLUCAP 101* 102* 109* 106* 103*   Hyponatremia; resolved.    Hypokalemia repleting with potassium chloride 40 po  GERD- on PPI  Morbid obesity BMI 35.5 she will benefit with weight loss/lifestyle changes and PCP follow-up, outpatient sleep apnea evaluation.  Insomnia continue on PRN HS trazodone per request  Diarrhea discontinue Colace add Imodium.  No leukocytosis notable tenderness monitor  Nutrition: Diet Order            Diet Carb Modified Fluid consistency: Thin; Room service appropriate? Yes  Diet effective now                 Nutrition Problem: Inadequate oral intake  Etiology: inability to eat Signs/Symptoms: NPO status Interventions: Tube feeding  Body mass index is 34.52 kg/m.  DVT prophylaxis: heparin injection 5,000 Units Start: 11/23/20 1400 SCDs Start:  11/23/20 7412 Code Status:   Code Status: Full Code Family Communication: plan of care discussed with patient at bedside.  Status is: Inpatient Remains inpatient appropriate because:IV treatments appropriate due to intensity of illness or inability to take PO and Inpatient level of care appropriate due to severity of illness  Dispo: The patient is from: Home              Anticipated d/c is to: Home              Anticipated d/c date is: 2 days              Patient currently is not medically stable to d/c.   Difficult to place patient No  Consultants:see note  Procedures:see note  Culture/Microbiology    Component Value Date/Time   SDES BLOOD RIGHT HAND 11/23/2020 0905   SPECREQUEST  11/23/2020 0905    BOTTLES DRAWN AEROBIC AND ANAEROBIC Blood Culture adequate volume   CULT  11/23/2020 0905    NO GROWTH 3 DAYS Performed at [facility], [place] [address], [place], [place] [address]    REPTSTATUS PENDING 11/23/2020 6720    Other culture-see note  Medications: Scheduled Meds: . aspirin  81 mg Oral Daily  . Chlorhexidine Gluconate Cloth  6 each Topical Daily  . cholecalciferol  2,000 Units Oral Daily  . docusate  100 mg Per Tube BID  . FLUoxetine  40 mg Oral Daily  . heparin  5,000 Units Subcutaneous Q8H  . insulin aspart  0-15 Units Subcutaneous TID WC  . insulin aspart  0-5 Units Subcutaneous QHS  . lidocaine  1 patch Transdermal Q24H  . metroNIDAZOLE  500 mg Oral Q8H  . pantoprazole  40 mg Oral Daily  . sodium chloride flush  10 mL Intracatheter Q8H   Continuous Infusions: . sodium chloride 50 mL/hr at 11/26/20 2123  . cefTRIAXone (ROCEPHIN)  IV 2 g (11/26/20 1437)    Antimicrobials: Anti-infectives (From admission, onward)   Start     Dose/Rate Route Frequency Ordered Stop   11/25/20 1400  metroNIDAZOLE (FLAGYL) tablet 500 mg        500 mg Oral Every 8 hours 11/25/20 1148     11/25/20 1300  cefTRIAXone (ROCEPHIN) 2 g in sodium chloride 0.9 % 100 mL IVPB         2 g 200 mL/hr over 30 Minutes Intravenous Every 24 hours 11/25/20 1148     11/24/20 1800  azithromycin (ZITHROMAX) tablet 500 mg  Status:  Discontinued        500 mg Oral Daily 11/24/20 1156 11/24/20 1212   11/24/20 1300  azithromycin (ZITHROMAX) tablet 500 mg  Status:  Discontinued        500 mg Oral Daily 11/24/20 1212 11/25/20 1148   11/24/20 1000  doxycycline (VIBRA-TABS) tablet 100 mg  Status:  Discontinued        100 mg Oral Every 12 hours 11/24/20 0913 11/25/20 1148     Objective: Vitals: Today's Vitals   11/26/20 2001 11/26/20 2112 11/26/20 2212 11/27/20 0406  BP: 120/64   122/62  Pulse: 89   90  Resp: 18   18  Temp: 98.1 F (36.7 C)   98.2 F (36.8 C)  TempSrc: Oral   Oral  SpO2: 98%   98%  Weight:  Height:      PainSc:  7  Asleep     Intake/Output Summary (Last 24 hours) at 11/27/2020 0735 Last data filed at 11/27/2020 0700 Gross per 24 hour  Intake 1964.71 ml  Output 0 ml  Net 1964.71 ml   Filed Weights   11/23/20 0322 11/23/20 0826 11/24/20 0500  Weight: 111 kg 103.6 kg 97 kg   Weight change:   Intake/Output from previous day: 01/30 0701 - 01/31 0700 In: 1964.7 [I.V.:1851.3; IV Piggyback:113.4] Out: 0  Intake/Output this shift: No intake/output data recorded. Filed Weights   11/23/20 0322 11/23/20 0826 11/24/20 0500  Weight: 111 kg 103.6 kg 97 kg    Examination: General exam: AAOx3, on bedside chair , NAD, weak appearing. HEENT:Oral mucosa moist, Ear/Nose WNL grossly, dentition normal. Respiratory system: bilaterally diminished breath sounds more on the right chest, right chest pigtail catheter present Cardiovascular system: S1 & S2 +, No JVD,. Gastrointestinal system: Abdomen soft, NT,ND, BS+ Nervous System:Alert, awake, moving extremities and grossly nonfocal Extremities: No edema, distal peripheral pulses palpable.  Skin: No rashes,no icterus. MSK: Normal muscle bulk,tone, power   Data Reviewed: I have personally reviewed following  labs and imaging studies CBC: Recent Labs  Lab 11/23/20 0324 11/23/20 0421 11/23/20 0525 11/24/20 0156 11/25/20 0036 11/26/20 0143 11/27/20 0243  WBC 16.5*  --   --  24.7* 17.8* 12.8* 8.6  NEUTROABS 10.7*  --   --   --   --   --   --   HGB 14.4   < > 15.6* 13.3 11.9* 11.4* 10.5*  HCT 45.3   < > 46.0 39.4 36.6 35.0* 30.7*  MCV 86.0  --   --  82.9 84.1 83.7 82.3  PLT 474*  --   --  368 306 272 252   < > = values in this interval not displayed.   Basic Metabolic Panel: Recent Labs  Lab 11/23/20 0324 11/23/20 0421 11/23/20 0525 11/24/20 0156 11/25/20 0036 11/26/20 0143 11/27/20 0243  NA 132*   < > 133* 134* 133* 134* 135  K 4.5   < > 4.4 5.2* 4.0 3.6 3.2*  CL 96*  --   --  99 101 100 103  CO2 22  --   --  25 21* 23 23  GLUCOSE 212*  --   --  95 111* 101* 97  BUN 21  --   --  23 12 9  6*  CREATININE 1.28*  --   --  0.94 0.72 0.64 0.58  CALCIUM 9.5  --   --  9.0 8.5* 8.4* 8.2*  MG 2.6*  --   --  2.5*  --   --   --    < > = values in this interval not displayed.   GFR: Estimated Creatinine Clearance: 82.3 mL/min (by C-G formula based on SCr of 0.58 mg/dL). Liver Function Tests: Recent Labs  Lab 11/23/20 0324 11/23/20 0849  AST 23  --   ALT 23  --   ALKPHOS 63  --   BILITOT 0.9  --   PROT 8.2* 7.0  ALBUMIN 3.4*  --    No results for input(s): LIPASE, AMYLASE in the last 168 hours. No results for input(s): AMMONIA in the last 168 hours. Coagulation Profile: No results for input(s): INR, PROTIME in the last 168 hours. Cardiac Enzymes: No results for input(s): CKTOTAL, CKMB, CKMBINDEX, TROPONINI in the last 168 hours. BNP (last 3 results) No results for input(s): PROBNP in the last 8760 hours.  HbA1C: No results for input(s): HGBA1C in the last 72 hours. CBG: Recent Labs  Lab 11/25/20 2115 11/26/20 0746 11/26/20 1223 11/26/20 1722 11/26/20 1959  GLUCAP 101* 102* 109* 106* 103*   Lipid Profile: No results for input(s): CHOL, HDL, LDLCALC, TRIG, CHOLHDL,  LDLDIRECT in the last 72 hours. Thyroid Function Tests: No results for input(s): TSH, T4TOTAL, FREET4, T3FREE, THYROIDAB in the last 72 hours. Anemia Panel: No results for input(s): VITAMINB12, FOLATE, FERRITIN, TIBC, IRON, RETICCTPCT in the last 72 hours. Sepsis Labs: Recent Labs  Lab 11/23/20 0849 11/25/20 1123 11/26/20 0143 11/27/20 0243  PROCALCITON 1.76 0.63 0.58 0.33    Recent Results (from the past 240 hour(s))  SARS Coronavirus 2 by RT PCR (hospital order, performed in [facility] hospital lab) Nasopharyngeal Nasopharyngeal Swab     Status: None   Collection Time: 11/23/20  3:06 AM   Specimen: Nasopharyngeal Swab  Result Value Ref Range Status   SARS Coronavirus 2 NEGATIVE NEGATIVE Final    Comment: (NOTE) SARS-CoV-2 target nucleic acids are NOT DETECTED.  The SARS-CoV-2 RNA is generally detectable in upper and lower respiratory specimens during the acute phase of infection. The lowest concentration of SARS-CoV-2 viral copies this assay can detect is 250 copies / mL. A negative result does not preclude SARS-CoV-2 infection and should not be used as the sole basis for treatment or other patient management decisions.  A negative result may occur with improper specimen collection / handling, submission of specimen other than nasopharyngeal swab, presence of viral mutation(s) within the areas targeted by this assay, and inadequate number of viral copies (<250 copies / mL). A negative result must be combined with clinical observations, patient history, and epidemiological information.  Fact Sheet for Patients:   StrictlyIdeas.no  Fact Sheet for Healthcare Providers: BankingDealers.co.za  This test is not yet approved or  cleared by the [place] FDA and has been authorized for detection and/or diagnosis of SARS-CoV-2 by FDA under an Emergency Use Authorization (EUA).  This EUA will remain in effect (meaning this test  can be used) for the duration of the COVID-19 declaration under Section 564(b)(1) of the Act, 21 U.S.C. section 360bbb-3(b)(1), unless the authorization is terminated or revoked sooner.  Performed at [facility], [place] [address], [place], [place] [address]   Body fluid culture (includes gram stain)     Status: None   Collection Time: 11/23/20  7:49 AM   Specimen: Pleural Fluid  Result Value Ref Range Status   Specimen Description PLEURAL  Final   Special Requests NONE  Final   Gram Stain NO WBC SEEN NO ORGANISMS SEEN   Final   Culture   Final    NO GROWTH 3 DAYS Performed at [facility], [place] [address], [place], [place] [address]    Report Status 11/26/2020 FINAL  Final  Culture, blood (routine x 2)     Status: None (Preliminary result)   Collection Time: 11/23/20  8:49 AM   Specimen: BLOOD  Result Value Ref Range Status   Specimen Description BLOOD LEFT ANTECUBITAL  Final   Special Requests   Final    BOTTLES DRAWN AEROBIC AND ANAEROBIC Blood Culture results may not be optimal due to an inadequate volume of blood received in culture bottles   Culture   Final    NO GROWTH 3 DAYS Performed at [facility], [place] [address] [place], [place] [address]    Report Status PENDING  Incomplete  Culture, blood (  routine x 2)     Status: None (Preliminary result)   Collection Time: 11/23/20  9:05 AM   Specimen: BLOOD RIGHT HAND  Result Value Ref Range Status   Specimen Description BLOOD RIGHT HAND  Final   Special Requests   Final    BOTTLES DRAWN AEROBIC AND ANAEROBIC Blood Culture adequate volume   Culture   Final    NO GROWTH 3 DAYS Performed at [facility], [address], [place], [place] [address]    Report Status PENDING  Incomplete  MRSA PCR Screening     Status: None   Collection Time: 11/23/20  9:14 AM   Specimen: Nasal Mucosa; Nasopharyngeal  Result Value Ref Range Status   MRSA by PCR NEGATIVE NEGATIVE Final    Comment:        The GeneXpert  MRSA Assay (FDA approved for NASAL specimens only), is one component of a comprehensive MRSA colonization surveillance program. It is not intended to diagnose MRSA infection nor to guide or monitor treatment for MRSA infections. Performed at [facility], [place] [address], [place], [place] [address]      Radiology Studies: DG Chest Port 1 View  Result Date: 11/26/2020 CLINICAL DATA:  Right pleural effusion EXAM: PORTABLE CHEST 1 VIEW COMPARISON:  Chest x-ray 11/25/2020, CT chest 11/23/2020 FINDINGS: Right chest tube pigtail overlying the right base unchanged. The heart size and mediastinal contours are unchanged. Right lung airspace disease again noted. Left base streaky airspace opacity likely representing atelectasis. Redemonstration of small right apical pneumothorax as well as small to moderate right pleural effusion. Suggestion of a trace to small volume left pleural effusion. No pneumothorax on the left. No acute osseous abnormality. IMPRESSION: 1. Stable small right apical pneumothorax and small to moderate right pleural effusion. Unchanged position of a right chest tube pigtail. Persistent underlying right lung airspace disease. Followup PA and lateral chest X-ray is recommended in 3-4 weeks to ensure resolution and exclude underlying malignancy. 2. Persistent trace to small volume left pleural effusion. Underlying atelectasis with superimposed infection/inflammation not excluded. Electronically Signed   By: Iven Finn M.D.   On: 11/26/2020 05:50   DG Chest Port 1 View  Result Date: 11/25/2020 CLINICAL DATA:  Right pleural effusion EXAM: PORTABLE CHEST 1 VIEW COMPARISON:  11/24/2020 FINDINGS: Right basilar pigtail chest tube unchanged. Interval development of right pneumothorax approximately 10 mm. Progression of airspace disease throughout the right lung. Mild to moderate right pleural effusion with mild progression. Mild left lower lobe atelectasis unchanged.  Small left  effusion. IMPRESSION: Interval development of right apical pneumothorax approximately 10 mm Progression of right lower lobe airspace disease and right pleural effusion. Electronically Signed   By: Franchot Gallo M.D.   On: 11/25/2020 09:47     LOS: 4 days   Antonieta Pert, MD [facility]  11/27/2020, 7:35 AM

## 2020-11-27 NOTE — Progress Notes (Signed)
   NAME:  JULLIE ARPS, MRN:  [id number], DOB:  04/02/1956, LOS: 4 ADMISSION DATE:  11/23/2020, CONSULTATION DATE:  11/23/2020 REFERRING MD:  Dr. Roxanne Mins, ER, CHIEF COMPLAINT:  Short of breath   Brief History:  65 yo female presented with progressive dyspnea.  Found to have large exudate pleural effusion requiring chest tube drainage.  Past Medical History:  DM type 2, HTN, GERD, Depression, OA, Allergies  Significant Hospital Events:  1/27 admit, chest tube placed 1/28 transfer to floor bed 1/29 chest tube to -20 suction 1/30: Had 3600 ml of fluid out on day one after pig tail placed.  About 100 ml out per day since 1/28.  Radiology reports reading small apical pneumothorax and effusion.  My view of chest xray looks more like RML and RLL infiltrate (better seen on CT chest) and smaller effusion, and less clear whether she still has pneumothorax. Consults:    Procedures:  ETT 1/27 >> 1/27 (self extubated) Rt pig tail catheter 1/27 >>   Significant Diagnostic Tests:  CT chest 1/27 >> large Rt effusion with collapse of Rt lung Rt pleural fluid 1/27 >> protein 5.3, LDH 1523, WBC 2280 (67% neutrophils) CT chest 1/27 >> consolidation of Rt lung most in middle and lower lobes  Micro Data:  COVID 1/27 >> negative MRSA PCR 1/27 >> negative Blood 1/27 >> Rt pleural fluid 1/27 >>neg  Antimicrobials:  Doxycycline 1/28 >> 1/29 Zithromax 1/28 >> 1/29 Rocephin 1/29 >> Flagyl 1/29 >>  Interim History / Subjective:  Reports cough at night, otherwise doing okay Objective   Blood pressure 122/62, pulse 90, temperature 98.2 F (36.8 C), temperature source Oral, resp. rate 18, height 5\' 6"  (1.676 m), weight 97 kg, SpO2 98 %.        Intake/Output Summary (Last 24 hours) at 11/27/2020 0820 Last data filed at 11/27/2020 0700 Gross per 24 hour  Intake 1964.71 ml  Output 0 ml  Net 1964.71 ml   Filed Weights   11/23/20 0322 11/23/20 0826 11/24/20 0500  Weight: 111 kg 103.6 kg 97 kg     Examination:  General 65 year old black female sitting up in chair no acute distress HEENT normocephalic atraumatic no jugular venous distention appreciated mucous membranes are moist Pulmonary diminished bilaterally, right chest tube in place.  There is no visible air leak there is minimal output currently 4 L/min nasal cannula saturations mid 90s Cardiac regular rate and rhythm Extremities warm dry with trace lower extremity edema Neuro intact   Assessment & Plan:  Community acquired bacterial pneumonia involving RML and RLL. Rt parapneumonic effusion s/p chest tube insertion. Hx of HTN. Hx of depression. DM type II.   Pulm prob list  Community acquired bacterial pneumonia involving RML and RLL. Rt parapneumonic effusion s/p chest tube insertion.   Discussion Body fluid culture neg Cytology still pending Imaging today (1/31): dense consolidation on right. Bibasilar airspace disease.  CT output--> minimal  Plan abx day 4 F/u cytology  Add IS and flutter Cont to flush CT per protocol  Change to water seal Repeat CT imaging vs just go-ahead and remove 2/1--> TBD by Dr Earlie Lou practice (evaluated daily)  Diet: carb modified DVT prophylaxis: SQ heparin GI prophylaxis: protonix Mobility: as tolerated Disposition: med-surg Code Status: full code   Signature: Erick Colace ACNP-BC [place] Pager # [phone] OR # [phone] if no answer

## 2020-11-27 NOTE — Progress Notes (Signed)
OT Cancellation Note  Patient Details Name: OTHA RICKLES MRN: [id number] DOB: 10-Apr-1956   Cancelled Treatment:    Reason Eval/Treat Not Completed: Medical issues which prohibited therapy (pt declined due to frequent diarrhea)  Malka So 11/27/2020, 1:21 PM  Nestor Lewandowsky, OTR/L Acute Rehabilitation Services Pager: [phone] Office: [phone]

## 2020-11-28 ENCOUNTER — Inpatient Hospital Stay (HOSPITAL_COMMUNITY): Payer: Medicare Other

## 2020-11-28 DIAGNOSIS — J90 Pleural effusion, not elsewhere classified: Secondary | ICD-10-CM | POA: Diagnosis not present

## 2020-11-28 DIAGNOSIS — J9601 Acute respiratory failure with hypoxia: Secondary | ICD-10-CM | POA: Diagnosis not present

## 2020-11-28 LAB — CBC
HCT: 31.3 % — ABNORMAL LOW (ref 36.0–46.0)
Hemoglobin: 10.4 g/dL — ABNORMAL LOW (ref 12.0–15.0)
MCH: 27.4 pg (ref 26.0–34.0)
MCHC: 33.2 g/dL (ref 30.0–36.0)
MCV: 82.6 fL (ref 80.0–100.0)
Platelets: 287 10*3/uL (ref 150–400)
RBC: 3.79 MIL/uL — ABNORMAL LOW (ref 3.87–5.11)
RDW: 15.5 % (ref 11.5–15.5)
WBC: 8.1 10*3/uL (ref 4.0–10.5)
nRBC: 0 % (ref 0.0–0.2)

## 2020-11-28 LAB — BASIC METABOLIC PANEL WITH GFR
Anion gap: 10 (ref 5–15)
BUN: 5 mg/dL — ABNORMAL LOW (ref 8–23)
CO2: 23 mmol/L (ref 22–32)
Calcium: 8.4 mg/dL — ABNORMAL LOW (ref 8.9–10.3)
Chloride: 100 mmol/L (ref 98–111)
Creatinine, Ser: 0.55 mg/dL (ref 0.44–1.00)
GFR, Estimated: >60 mL/min
Glucose, Bld: 104 mg/dL — ABNORMAL HIGH (ref 70–99)
Potassium: 3.3 mmol/L — ABNORMAL LOW (ref 3.5–5.1)
Sodium: 133 mmol/L — ABNORMAL LOW (ref 135–145)

## 2020-11-28 LAB — GLUCOSE, CAPILLARY
Glucose-Capillary: 90 mg/dL (ref 70–99)
Glucose-Capillary: 111 mg/dL — ABNORMAL HIGH (ref 70–99)
Glucose-Capillary: 83 mg/dL (ref 70–99)
Glucose-Capillary: 104 mg/dL — ABNORMAL HIGH (ref 70–99)

## 2020-11-28 LAB — CULTURE, BLOOD (ROUTINE X 2)
Culture: NO GROWTH
Culture: NO GROWTH
Special Requests: ADEQUATE

## 2020-11-28 MED ORDER — ALUM & MAG HYDROXIDE-SIMETH 200-200-20 MG/5ML PO SUSP: 15.0000 mL | Freq: Four times a day (QID) | ORAL | Status: DC | PRN

## 2020-11-28 MED ORDER — PANTOPRAZOLE SODIUM 40 MG PO TBEC
40.0000 mg | DELAYED_RELEASE_TABLET | Freq: Two times a day (BID) | ORAL | Status: DC
  Administered 2020-11-28 – 2020-11-30 (×5): 40 mg via ORAL
  Filled 2020-11-28 (×5): qty 1

## 2020-11-28 MED ORDER — POTASSIUM CHLORIDE CRYS ER 20 MEQ PO TBCR
40.0000 meq | EXTENDED_RELEASE_TABLET | Freq: Once | ORAL | Status: AC
  Administered 2020-11-28: 40 meq via ORAL
  Filled 2020-11-28: qty 2

## 2020-11-28 MED ORDER — SACCHAROMYCES BOULARDII 250 MG PO CAPS
250.0000 mg | ORAL_CAPSULE | Freq: Two times a day (BID) | ORAL | Status: DC
  Administered 2020-11-28 – 2020-11-30 (×5): 250 mg via ORAL
  Filled 2020-11-28 (×5): qty 1

## 2020-11-28 NOTE — Progress Notes (Signed)
Pt chest tube remove by RN from [place], cxr already done waiting for results.

## 2020-11-28 NOTE — TOC Initial Note (Signed)
Transition of Care [facility]) - Initial/Assessment Note    Patient Details  Name: Yvette Roberts MRN: [id number] Date of Birth: Dec 03, 1955  Transition of Care [facility]) CM/SW Contact:    Marilu Favre, RN Phone Number: 11/28/2020, 3:51 PM  Clinical Narrative:                 Patient from home alone, however, at discharge she will be staying with her brother. She is unsure of his exact address but it is [place]. Discussed OP PT, patient in agreement and has transportation. Discussed locations, she prefers [place]. Order placed.  Also discussed rollator, 3 in1 and home oxygen. Patient voiced understanding . Same ordered.   PCP is Caryl Ada PA at [facility].   Expected Discharge Plan: Home/Self Care Barriers to Discharge: Continued Medical Work up   Patient Goals and CMS Choice Patient states their goals for this hospitalization and ongoing recovery are:: to feel better CMS Medicare.gov Compare Post Acute Care list provided to:: Patient Choice offered to / list presented to : Patient  Expected Discharge Plan and Services Expected Discharge Plan: Home/Self Care   Discharge Planning Services: CM Consult Post Acute Care Choice: Durable Medical Equipment Living arrangements for the past 2 months: Single Family Home                 DME Arranged: 3-N-1,Oxygen,Walker rolling with seat DME Agency: [employer] Date DME Agency Contacted: 11/28/20 Time DME Agency Contacted: [id number] Representative spoke with at DME Agency: Freda Munro HH Arranged: NA          Prior Living Arrangements/Services Living arrangements for the past 2 months: Single Family Home Lives with:: Self Patient language and need for interpreter reviewed:: Yes Do you feel safe going back to the place where you live?: Yes      Need for Family Participation in Patient Care: Yes (Comment) Care giver support system in place?: Yes (comment)   Criminal Activity/Legal Involvement Pertinent to Current  Situation/Hospitalization: No - Comment as needed  Activities of Daily Living Home Assistive Devices/Equipment: None ADL Screening (condition at time of admission) Patient's cognitive ability adequate to safely complete daily activities?: Yes Is the patient deaf or have difficulty hearing?: No Does the patient have difficulty seeing, even when wearing glasses/contacts?: No Does the patient have difficulty concentrating, remembering, or making decisions?: No Patient able to express need for assistance with ADLs?: Yes Does the patient have difficulty dressing or bathing?: No Independently performs ADLs?: Yes (appropriate for developmental age) Does the patient have difficulty walking or climbing stairs?: No Weakness of Legs: None Weakness of Arms/Hands: None  Permission Sought/Granted   Permission granted to share information with : No              Emotional Assessment Appearance:: Appears stated age Attitude/Demeanor/Rapport: Engaged Affect (typically observed): Accepting Orientation: : Oriented to Self,Oriented to Place,Oriented to  Time,Oriented to Situation Alcohol / Substance Use: Not Applicable Psych Involvement: No (comment)  Admission diagnosis:  Hyponatremia [E87.1] Acute kidney injury (nontraumatic) (HCC) [N17.9] Recurrent right pleural effusion [J90] Chest tube in place [Z96.89] Acute respiratory failure with hypoxemia ([place]) [J96.01] Acute hypoxemic respiratory failure ([place]) [J96.01] Patient Active Problem List   Diagnosis Date Noted  . Acute hypoxemic respiratory failure ([place]) 11/23/2020  . Pleural effusion, right 11/23/2020  . Atelectasis of right lung 11/23/2020  . Hyponatremia 11/23/2020  . Trigger point of right shoulder region 03/09/2019  . Cervical radiculopathy at C8 03/09/2019  . Unilateral primary osteoarthritis, left  knee 11/17/2017  . Unilateral primary osteoarthritis, right knee 11/17/2017  . Degenerative arthritis of knee, bilateral 07/14/2017   . Baker cyst, left 06/16/2017  . Pain and swelling of left lower extremity 05/26/2017  . Degenerative joint disease of knee, left 05/26/2017  . Hyperlipemia 06/12/2016  . MDD (major depressive disorder) 06/12/2016  . Urticaria 06/12/2016  . Type 2 diabetes mellitus without complication ([place]) 38/33/3832  . Morbid obesity ([place]) 07/21/2014  . Resistant hypertension 07/21/2014  . Esophageal reflux 07/21/2014   PCP:  Pcp, No Pharmacy:   [facility] [place], [facility] [address] [place] [place] [address] Phone: [phone] Fax: [phone]  [facility] [place], [place] [address] Phone: [phone] Fax: [phone]     Social Determinants of Health (SDOH) Interventions    Readmission Risk Interventions No flowsheet data found.

## 2020-11-28 NOTE — Progress Notes (Addendum)
Physical Therapy Note  SATURATION QUALIFICATIONS: (This note is used to comply with regulatory documentation for home oxygen)  Patient Saturations on Room Air at Rest = 92%  Patient Saturations on Room Air while Ambulating = 86%  Patient Saturations on 4 Liters of oxygen while Ambulating = 93%  Please briefly explain why patient needs home oxygen: Patient requires supplemental oxygen to maintain oxygen saturations at acceptable, safe levels with physical activity.  (Full note to follow)   Roney Marion, [place] Pager [phone] Office [phone]

## 2020-11-28 NOTE — Progress Notes (Signed)
Physical Therapy Treatment Patient Details Name: Yvette Roberts MRN: [id number] DOB: 09-Feb-1956 Today's Date: 11/28/2020    History of Present Illness 65 yo admitted 1/27 with respiratory failure with intubation/extubation same date. Pigtail catheter placed 1/27 for right pleural effusion; changed to a chest tube on 1/28; PMhx: depression, R frozen shoulder, DM    PT Comments    Continuing work on functional mobility and activity tolerance;  Session focused on performing walking O2 sat test -- she qualifies for supplemental O2 in the home; Excellent progress   Follow Up Recommendations  Outpatient PT     Equipment Recommendations  Other (comment) (Rollator)    Recommendations for Other Services       Precautions / Restrictions Precautions Precautions: Fall Precaution Comments: watch sats Restrictions Weight Bearing Restrictions: No    Mobility  Bed Mobility Overal bed mobility: Needs Assistance Bed Mobility: Supine to Sit     Supine to sit: HOB elevated;Supervision     General bed mobility comments: Cues to self-monitor for activity tolernace  Transfers Overall transfer level: Needs assistance Equipment used: Rolling walker (2 wheeled) Transfers: Sit to/from Stand Sit to Stand: Supervision         General transfer comment: Cues to self-monitor for activity tolerance  Ambulation/Gait Ambulation/Gait assistance: Min guard Gait Distance (Feet): 100 Feet Assistive device: Rolling walker (2 wheeled) Gait Pattern/deviations: Step-through pattern Gait velocity: slowed   General Gait Details: Cues to self-monitor for activity tolerance; see other PT note of this date for O2 sats/activity tolerance   Stairs             Wheelchair Mobility    Modified Rankin (Stroke Patients Only)       Balance     Sitting balance-Leahy Scale: Good       Standing balance-Leahy Scale: Fair                              Cognition Arousal/Alertness:  Awake/alert Behavior During Therapy: WFL for tasks assessed/performed Overall Cognitive Status: Within Functional Limits for tasks assessed                                        Exercises      General Comments        Pertinent Vitals/Pain Pain Assessment: Faces Faces Pain Scale: Hurts little more Pain Location: Chest tube site with coughing; pt reports it's not pain, but maybe exertion Pain Descriptors / Indicators: Aching;Guarding;Sore;Cramping Pain Intervention(s): Monitored during session    Home Living                      Prior Function            PT Goals (current goals can now be found in the care plan section) Acute Rehab PT Goals Patient Stated Goal: return home and to work PT Goal Formulation: With patient Time For Goal Achievement: 12/08/20 Potential to Achieve Goals: Good Progress towards PT goals: Progressing toward goals    Frequency    Min 3X/week      PT Plan Frequency needs to be updated;Current plan remains appropriate    Co-evaluation              AM-PAC PT "6 Clicks" Mobility   Outcome Measure  Help needed turning from your back to your side while in a flat  bed without using bedrails?: None Help needed moving from lying on your back to sitting on the side of a flat bed without using bedrails?: None Help needed moving to and from a bed to a chair (including a wheelchair)?: None Help needed standing up from a chair using your arms (e.g., wheelchair or bedside chair)?: None Help needed to walk in hospital room?: None Help needed climbing 3-5 steps with a railing? : A Little 6 Click Score: 23    End of Session Equipment Utilized During Treatment: Oxygen Activity Tolerance: Patient tolerated treatment well Patient left: in chair;with call bell/phone within reach Nurse Communication: Mobility status PT Visit Diagnosis: Other abnormalities of gait and mobility (R26.89);Difficulty in walking, not elsewhere  classified (R26.2)     Time: 8206-0156 PT Time Calculation (min) (ACUTE ONLY): 26 min  Charges:  $Gait Training: 23-37 mins                     Roney Marion, [place]  Acute Rehabilitation Services Pager [phone] Office [place] 11/28/2020, 5:25 PM

## 2020-11-28 NOTE — Care Management Important Message (Signed)
Important Message  Patient Details  Name: Yvette Roberts MRN: [id number] Date of Birth: 1956-08-04   Medicare Important Message Given:  Yes     Orbie Pyo 11/28/2020, 2:32 PM

## 2020-11-28 NOTE — Progress Notes (Signed)
NAME:  Yvette Roberts, MRN:  [id number], DOB:  January 18, 1956, LOS: 5 ADMISSION DATE:  11/23/2020, CONSULTATION DATE:  11/23/2020 REFERRING MD:  Roxanne Mins, CHIEF COMPLAINT:  Dyspnea   Brief History:  65 yo female presented with progressive dyspnea.  Found to have large exudate pleural effusion requiring chest tube drainage  Past Medical History:  DM type 2, HTN, GERD, Depression, OA, Allergies  Significant Hospital Events:  1/27 admit, chest tube placed 1/28 transfer to floor bed 1/29 chest tube to -20 suction 1/30: Had 3600 ml of fluid out on day one after pig tail placed.  About 100 ml out per day since 1/28.  Radiology reports reading small apical pneumothorax and effusion.  My view of chest xray looks more like RML and RLL infiltrate (better seen on CT chest) and smaller effusion, and less clear whether she still has pneumothorax.  Consults:    Procedures:  ETT 1/27 >> 1/27 (self extubated) Rt pig tail catheter 1/27 >>  Significant Diagnostic Tests:  CT chest 1/27 >> large Rt effusion with collapse of Rt lung Rt pleural fluid 1/27 >> protein 5.3, LDH 1523, WBC 2280 (67% neutrophils) CT chest 1/27 >> consolidation of Rt lung most in middle and lower lobes  Micro Data:  COVID 1/27 >> negative MRSA PCR 1/27 >> negative Blood 1/27 >> Rt pleural fluid 1/27 >>neg  Antimicrobials:  Doxycycline 1/28 >> 1/29 Zithromax 1/28 >> 1/29 Rocephin 1/29 >> Flagyl 1/29 >>   Interim History / Subjective:  Upset stomach Loose stools Otherwise feeling OK Breathing has improved but she still has some mild dyspnea No chest tube output  Objective   Blood pressure 131/66, pulse 80, temperature 98.8 F (37.1 C), temperature source Oral, resp. rate 18, height 5\' 6"  (1.676 m), weight 97 kg, SpO2 96 %.        Intake/Output Summary (Last 24 hours) at 11/28/2020 0801 Last data filed at 11/28/2020 0400 Gross per 24 hour  Intake 450 ml  Output -  Net 450 ml   Filed Weights   11/23/20 0322  11/23/20 0826 11/24/20 0500  Weight: 111 kg 103.6 kg 97 kg    Examination: General:  Resting comfortably in bed HENT: NCAT OP clear PULM: Diminished R base but air movement symmetrical, normal effort CV: RRR, no mgr GI: BS+, soft, nontender MSK: normal bulk and tone Neuro: awake, alert, no distress, MAEW   Pleural fluid analysis: LDH 1523, Protin 5.3, WBC 2280, 67% PMN  2/1 CXR images personally reviewed>slight increase in bilateral pleural effusions,chest tube in place  Resolved Hospital Problem list     Assessment & Plan:  Community acquired bactereial pneumonia in RML and RLL Right parapneumonic effusion, s/p chest tube insertion Acute hypoxemic respiratory failure due to pneumonia Hypertension Depression DM2 Dyspepsia, loose stools, likely due to antibiotics  Discussion: I agree there is a slight increase in her right pleural effusion, however the chest tube has not been draining fluid.  The team has been flushing the pigtail drainage catheter appropriately, and fluid goes in OK but doesn't come out, hence the slight increase in size of the effusion.  Today when I flushed the catheter saline leaked around from the insertion site.  She has improved overall.  I explained the natural history of pneumonia, this will take weeks to recover  Plan: Remove chest tube Walk to assess O2 saturation Would compl Will need to be d/c'd home on O2 assuming she qualifies (I suspect she will) Needs CXR post chest tube removal Will  need outpatient follow up with PCP to monitor O2 saturation and repeat CXR to ensure the pleural fluid has not increased in size Add pro-biotic OK to d/c home within next 24 hours from my standpoint   Best practice (evaluated daily)   Per [facility]  Goals of Care:   Per [facility]  Labs   CBC: Recent Labs  Lab 11/23/20 0324 11/23/20 0421 11/24/20 0156 11/25/20 0036 11/26/20 0143 11/27/20 0243 11/28/20 0024  WBC 16.5*  --  24.7* 17.8* 12.8* 8.6 8.1   NEUTROABS 10.7*  --   --   --   --   --   --   HGB 14.4   < > 13.3 11.9* 11.4* 10.5* 10.4*  HCT 45.3   < > 39.4 36.6 35.0* 30.7* 31.3*  MCV 86.0  --  82.9 84.1 83.7 82.3 82.6  PLT 474*  --  368 306 272 252 287   < > = values in this interval not displayed.    Basic Metabolic Panel: Recent Labs  Lab 11/23/20 0324 11/23/20 0421 11/24/20 0156 11/25/20 0036 11/26/20 0143 11/27/20 0243 11/28/20 0024  NA 132*   < > 134* 133* 134* 135 133*  K 4.5   < > 5.2* 4.0 3.6 3.2* 3.3*  CL 96*  --  99 101 100 103 100  CO2 22  --  25 21* 23 23 23   GLUCOSE 212*  --  95 111* 101* 97 104*  BUN 21  --  23 12 9  6* 5*  CREATININE 1.28*  --  0.94 0.72 0.64 0.58 0.55  CALCIUM 9.5  --  9.0 8.5* 8.4* 8.2* 8.4*  MG 2.6*  --  2.5*  --   --   --   --    < > = values in this interval not displayed.   GFR: Estimated Creatinine Clearance: 82.3 mL/min (by C-G formula based on SCr of 0.55 mg/dL). Recent Labs  Lab 11/23/20 0849 11/24/20 0156 11/25/20 0036 11/25/20 1123 11/26/20 0143 11/27/20 0243 11/28/20 0024  PROCALCITON 1.76  --   --  0.63 0.58 0.33  --   WBC  --    < > 17.8*  --  12.8* 8.6 8.1   < > = values in this interval not displayed.    Liver Function Tests: Recent Labs  Lab 11/23/20 0324 11/23/20 0849  AST 23  --   ALT 23  --   ALKPHOS 63  --   BILITOT 0.9  --   PROT 8.2* 7.0  ALBUMIN 3.4*  --    No results for input(s): LIPASE, AMYLASE in the last 168 hours. No results for input(s): AMMONIA in the last 168 hours.  ABG    Component Value Date/Time   PHART 7.324 (L) 11/23/2020 0421   PCO2ART 46.0 11/23/2020 0421   PO2ART 283 (H) 11/23/2020 0421   HCO3 26.4 11/23/2020 0525   TCO2 29 11/23/2020 0525   ACIDBASEDEF 6.0 (H) 11/23/2020 0525   O2SAT 99.0 11/23/2020 0525     Coagulation Profile: No results for input(s): INR, PROTIME in the last 168 hours.  Cardiac Enzymes: No results for input(s): CKTOTAL, CKMB, CKMBINDEX, TROPONINI in the last 168 hours.  HbA1C: Hgb A1c  MFr Bld  Date/Time Value Ref Range Status  11/23/2020 08:49 AM 6.3 (H) 4.8 - 5.6 % Final    Comment:    (NOTE) Pre diabetes:          5.7%-6.4%  Diabetes:              >  6.4%  Glycemic control for   <7.0% adults with diabetes   04/20/2019 08:09 AM 6.0 4.6 - 6.5 % Final    Comment:    Glycemic Control Guidelines for People with Diabetes:Non Diabetic:  <6%Goal of Therapy: <7%Additional Action Suggested:  >8%     CBG: Recent Labs  Lab 11/27/20 0743 11/27/20 1141 11/27/20 1712 11/27/20 2041 11/28/20 0754  GLUCAP 100* 109* 94 122* 90     Critical care time: n/a    Roselie Awkward, MD [facility] Pager: [phone] Cell: [phone] If no response, call [phone]

## 2020-11-28 NOTE — Progress Notes (Signed)
PROGRESS NOTE    CODA FILLER  PXT:[id number] DOB: [birth year] DOA: 11/23/2020 PCP: Pcp, No   Chief Complaint  Patient presents with  . Respiratory Distress  Brief Narrative: 65 year old female morbidly obese with type 2 diabetes, depression arthritis/frozen shoulder, GERD, resistant hypertension presented to the ED on 11/23/2020 with shortness of breath, EMS was summoned and on arrival was alert and awake but en route to the ED dyspnea worsened with increasing oxygen requirement placed on nonrebreather and in the ED urgently intubated. Patient was admitted to ICU found to have large exudative pleural effusion requiring chest tube drainage. Chest tube placed 1/27, extubated self 1/27.  1/29 transfer to floor bed CT chest 1/27 >> large Rt effusion with collapse of Rt lung Rt pleural fluid 1/27 >> protein 5.3, LDH 1523, WBC 2280 (67% neutrophils) CT chest 1/27 >> consolidation of Rt lung most in middle and lower lobes 1/29-chest x-ray with right apical pneumothorax 10 mm, seen by Dr. Halford Chessman and cHest tube palced on suction  Subjective: Patient is feeling upbeat no new complaints.  She is hopeful about getting the chest tube out today.  She is on 3 to nasal cannula.    Assessment & Plan:  Acute hypoxemic respiratory failure needing intubation on admission secondary to pneumonia/pleural effusion-self extubated 1/27.  On 3 L nasal cannula.  Continue oxygen.  Will likely need supplemental home oxygen.  Exudative pleural effusion/community-acquired bacterial pneumonia involving RML and RLL/Rt apical pneumothorax ( new on 1/29): On ceftriaxone/Flagyl  Since 1.29.22, already received azithromycin and doxycycline.blood culture no growth so far, pleural fluid culture no growth.  Chest tube is being removed today as per pulmonary.  Will request PT OT eval, follow-up chest x-ray and will need home oxygen.  Add probiotic continue current antibiotics.   Severe sepsis POA with leukocytosis 16.5,  tachypnea, hypoxia, AKI: Sepsis parameters resolved.  Leukocytosis and procalcitonin nicely downtrended.  Continue current antibiotics and will need antibiotic upon discharge.   Recent Labs  Lab 11/23/20 0849 11/24/20 0156 11/25/20 0036 11/25/20 1123 11/26/20 0143 11/27/20 0243 11/28/20 0024  WBC  --  24.7* 17.8*  --  12.8* 8.6 8.1  PROCALCITON 1.76  --   --  0.63 0.58 0.33  --    Hypertension well controlled.  Continue to hold Norvasc Cozaar Aldactone.  History of depression mood is stable on Prozac.   Prolonged QTC on EKG on 12/27, resolved to 447 on 1/29 EKG  Type 2 diabetes mellitus without complication: Blood sugar is stable.  Stable hemoglobin A1c 6.3 11/23/20. Cont asa.  On sliding scale setting. Recent Labs  Lab 11/27/20 1141 11/27/20 1712 11/27/20 2041 11/28/20 0754 11/28/20 1229  GLUCAP 109* 94 122* 90 111*   Hyponatremia; resolved  Hypokalemia replete orally  GERD-increase PPI to twice daily as he is complaining of reflux symptoms and also added Maalox  Morbid obesity BMI 35.5 she will benefit with weight loss/lifestyle changes and PCP follow-up, outpatient sleep apnea evaluation.  Insomnia continue on PRN HS trazodone per request  Diarrhea discontinued Colace add Imodium.  No leukocytosis no abdomen tenderness  Nutrition: Diet Order            Diet Carb Modified Fluid consistency: Thin; Room service appropriate? Yes  Diet effective now                 Nutrition Problem: Inadequate oral intake Etiology: inability to eat Signs/Symptoms: NPO status Interventions: Ensure Enlive (each supplement provides 350kcal and 20 grams of protein),MVI  Body mass  index is 34.52 kg/m.  DVT prophylaxis: heparin injection 5,000 Units Start: 11/23/20 1400 SCDs Start: 11/23/20 4193 Code Status:   Code Status: Full Code Family Communication: plan of care discussed with patient at bedside.  Status is: Inpatient Remains inpatient appropriate because:IV treatments  appropriate due to intensity of illness or inability to take PO and Inpatient level of care appropriate due to severity of illness  Dispo: The patient is from: Home              Anticipated d/c is to: Home              Anticipated d/c date is: 1 day if okay with pulmonary.              Patient currently is not medically stable to d/c.   Difficult to place patient No  Consultants:see note  Procedures:see note  Culture/Microbiology    Component Value Date/Time   SDES BLOOD RIGHT HAND 11/23/2020 0905   SPECREQUEST  11/23/2020 0905    BOTTLES DRAWN AEROBIC AND ANAEROBIC Blood Culture adequate volume   CULT  11/23/2020 0905    NO GROWTH 4 DAYS Performed at [facility], [place] [address], [place], [place] [address]    REPTSTATUS PENDING 11/23/2020 0973    Other culture-see note  Medications: Scheduled Meds: . aspirin  81 mg Oral Daily  . Chlorhexidine Gluconate Cloth  6 each Topical Daily  . cholecalciferol  2,000 Units Oral Daily  . feeding supplement  237 mL Oral TID BM  . FLUoxetine  40 mg Oral Daily  . heparin  5,000 Units Subcutaneous Q8H  . insulin aspart  0-15 Units Subcutaneous TID WC  . insulin aspart  0-5 Units Subcutaneous QHS  . lidocaine  1 patch Transdermal Q24H  . metroNIDAZOLE  500 mg Oral Q8H  . multivitamin with minerals  1 tablet Oral Daily  . pantoprazole  40 mg Oral BID  . saccharomyces boulardii  250 mg Oral BID  . sodium chloride flush  10 mL Intracatheter Q8H   Continuous Infusions: . cefTRIAXone (ROCEPHIN)  IV 2 g (11/28/20 1243)    Antimicrobials: Anti-infectives (From admission, onward)   Start     Dose/Rate Route Frequency Ordered Stop   11/25/20 1400  metroNIDAZOLE (FLAGYL) tablet 500 mg        500 mg Oral Every 8 hours 11/25/20 1148     11/25/20 1300  cefTRIAXone (ROCEPHIN) 2 g in sodium chloride 0.9 % 100 mL IVPB        2 g 200 mL/hr over 30 Minutes Intravenous Every 24 hours 11/25/20 1148     11/24/20 1800  azithromycin (ZITHROMAX)  tablet 500 mg  Status:  Discontinued        500 mg Oral Daily 11/24/20 1156 11/24/20 1212   11/24/20 1300  azithromycin (ZITHROMAX) tablet 500 mg  Status:  Discontinued        500 mg Oral Daily 11/24/20 1212 11/25/20 1148   11/24/20 1000  doxycycline (VIBRA-TABS) tablet 100 mg  Status:  Discontinued        100 mg Oral Every 12 hours 11/24/20 0913 11/25/20 1148     Objective: Vitals: Today's Vitals   11/27/20 2155 11/28/20 0443 11/28/20 0805 11/28/20 1338  BP:  131/66  122/61  Pulse:  80  85  Resp:  18  18  Temp:  98.8 F (37.1 C)  97.9 F (36.6 C)  TempSrc:  Oral  Oral  SpO2:  96%  99%  Weight:      Height:      PainSc: 1   0-No pain     Intake/Output Summary (Last 24 hours) at 11/28/2020 1445 Last data filed at 11/28/2020 0805 Gross per 24 hour  Intake 250 ml  Output --  Net 250 ml   Filed Weights   11/23/20 0322 11/23/20 0826 11/24/20 0500  Weight: 111 kg 103.6 kg 97 kg   Weight change:   Intake/Output from previous day: 01/31 0701 - 02/01 0700 In: 690 [P.O.:680] Out: -  Intake/Output this shift: Total I/O In: 20 [I.V.:10] Out: -  Filed Weights   11/23/20 0322 11/23/20 0826 11/24/20 0500  Weight: 111 kg 103.6 kg 97 kg    Examination: General exam: AAOx3, obese, not in acute distress, NAD, weak appearing. HEENT:Oral mucosa moist, Ear/Nose WNL grossly, dentition normal. Respiratory system: bilaterally basal crackles present diminished breath sound on the right base Cardiovascular system: S1 & S2 +, No JVD,. Gastrointestinal system: Abdomen soft, NT,ND, BS+ Nervous System:Alert, awake, moving extremities and grossly nonfocal Extremities: No edema, distal peripheral pulses palpable.  Skin: No rashes,no icterus. MSK: Normal muscle bulk,tone, power   Data Reviewed: I have personally reviewed following labs and imaging studies CBC: Recent Labs  Lab 11/23/20 0324 11/23/20 0421 11/24/20 0156 11/25/20 0036 11/26/20 0143 11/27/20 0243 11/28/20 0024  WBC  16.5*  --  24.7* 17.8* 12.8* 8.6 8.1  NEUTROABS 10.7*  --   --   --   --   --   --   HGB 14.4   < > 13.3 11.9* 11.4* 10.5* 10.4*  HCT 45.3   < > 39.4 36.6 35.0* 30.7* 31.3*  MCV 86.0  --  82.9 84.1 83.7 82.3 82.6  PLT 474*  --  368 306 272 252 287   < > = values in this interval not displayed.   Basic Metabolic Panel: Recent Labs  Lab 11/23/20 0324 11/23/20 0421 11/24/20 0156 11/25/20 0036 11/26/20 0143 11/27/20 0243 11/28/20 0024  NA 132*   < > 134* 133* 134* 135 133*  K 4.5   < > 5.2* 4.0 3.6 3.2* 3.3*  CL 96*  --  99 101 100 103 100  CO2 22  --  25 21* 23 23 23   GLUCOSE 212*  --  95 111* 101* 97 104*  BUN 21  --  23 12 9  6* 5*  CREATININE 1.28*  --  0.94 0.72 0.64 0.58 0.55  CALCIUM 9.5  --  9.0 8.5* 8.4* 8.2* 8.4*  MG 2.6*  --  2.5*  --   --   --   --    < > = values in this interval not displayed.   GFR: Estimated Creatinine Clearance: 82.3 mL/min (by C-G formula based on SCr of 0.55 mg/dL). Liver Function Tests: Recent Labs  Lab 11/23/20 0324 11/23/20 0849  AST 23  --   ALT 23  --   ALKPHOS 63  --   BILITOT 0.9  --   PROT 8.2* 7.0  ALBUMIN 3.4*  --    No results for input(s): LIPASE, AMYLASE in the last 168 hours. No results for input(s): AMMONIA in the last 168 hours. Coagulation Profile: No results for input(s): INR, PROTIME in the last 168 hours. Cardiac Enzymes: No results for input(s): CKTOTAL, CKMB, CKMBINDEX, TROPONINI in the last 168 hours. BNP (last 3 results) No results for input(s): PROBNP in the last 8760 hours. HbA1C: No results for input(s): HGBA1C in the last 72 hours. CBG:  Recent Labs  Lab 11/27/20 1141 11/27/20 1712 11/27/20 2041 11/28/20 0754 11/28/20 1229  GLUCAP 109* 94 122* 90 111*   Lipid Profile: No results for input(s): CHOL, HDL, LDLCALC, TRIG, CHOLHDL, LDLDIRECT in the last 72 hours. Thyroid Function Tests: No results for input(s): TSH, T4TOTAL, FREET4, T3FREE, THYROIDAB in the last 72 hours. Anemia Panel: No results  for input(s): VITAMINB12, FOLATE, FERRITIN, TIBC, IRON, RETICCTPCT in the last 72 hours. Sepsis Labs: Recent Labs  Lab 11/23/20 0849 11/25/20 1123 11/26/20 0143 11/27/20 0243  PROCALCITON 1.76 0.63 0.58 0.33    Recent Results (from the past 240 hour(s))  SARS Coronavirus 2 by RT PCR (hospital order, performed in [facility] hospital lab) Nasopharyngeal Nasopharyngeal Swab     Status: None   Collection Time: 11/23/20  3:06 AM   Specimen: Nasopharyngeal Swab  Result Value Ref Range Status   SARS Coronavirus 2 NEGATIVE NEGATIVE Final    Comment: (NOTE) SARS-CoV-2 target nucleic acids are NOT DETECTED.  The SARS-CoV-2 RNA is generally detectable in upper and lower respiratory specimens during the acute phase of infection. The lowest concentration of SARS-CoV-2 viral copies this assay can detect is 250 copies / mL. A negative result does not preclude SARS-CoV-2 infection and should not be used as the sole basis for treatment or other patient management decisions.  A negative result may occur with improper specimen collection / handling, submission of specimen other than nasopharyngeal swab, presence of viral mutation(s) within the areas targeted by this assay, and inadequate number of viral copies (<250 copies / mL). A negative result must be combined with clinical observations, patient history, and epidemiological information.  Fact Sheet for Patients:   StrictlyIdeas.no  Fact Sheet for Healthcare Providers: BankingDealers.co.za  This test is not yet approved or  cleared by the [place] FDA and has been authorized for detection and/or diagnosis of SARS-CoV-2 by FDA under an Emergency Use Authorization (EUA).  This EUA will remain in effect (meaning this test can be used) for the duration of the COVID-19 declaration under Section 564(b)(1) of the Act, 21 U.S.C. section 360bbb-3(b)(1), unless the authorization is terminated  or revoked sooner.  Performed at [facility], [place] [address], [place], [place] [address]   Body fluid culture (includes gram stain)     Status: None   Collection Time: 11/23/20  7:49 AM   Specimen: Pleural Fluid  Result Value Ref Range Status   Specimen Description PLEURAL  Final   Special Requests NONE  Final   Gram Stain NO WBC SEEN NO ORGANISMS SEEN   Final   Culture   Final    NO GROWTH 3 DAYS Performed at [facility], [place] [address], [place], [place] [address]    Report Status 11/26/2020 FINAL  Final  Culture, blood (routine x 2)     Status: None (Preliminary result)   Collection Time: 11/23/20  8:49 AM   Specimen: BLOOD  Result Value Ref Range Status   Specimen Description BLOOD LEFT ANTECUBITAL  Final   Special Requests   Final    BOTTLES DRAWN AEROBIC AND ANAEROBIC Blood Culture results may not be optimal due to an inadequate volume of blood received in culture bottles   Culture   Final    NO GROWTH 4 DAYS Performed at [facility], [place] [address], [place], [place] [address]    Report Status PENDING  Incomplete  Culture, blood (routine x 2)     Status: None (Preliminary result)  Collection Time: 11/23/20  9:05 AM   Specimen: BLOOD RIGHT HAND  Result Value Ref Range Status   Specimen Description BLOOD RIGHT HAND  Final   Special Requests   Final    BOTTLES DRAWN AEROBIC AND ANAEROBIC Blood Culture adequate volume   Culture   Final    NO GROWTH 4 DAYS Performed at [facility], [address], [place], [place] [address]    Report Status PENDING  Incomplete  MRSA PCR Screening     Status: None   Collection Time: 11/23/20  9:14 AM   Specimen: Nasal Mucosa; Nasopharyngeal  Result Value Ref Range Status   MRSA by PCR NEGATIVE NEGATIVE Final    Comment:        The GeneXpert MRSA Assay (FDA approved for NASAL specimens only), is one component of a comprehensive MRSA colonization surveillance program. It is not intended to diagnose  MRSA infection nor to guide or monitor treatment for MRSA infections. Performed at [facility], [place] [address] [place], [place] [address]      Radiology Studies: DG Chest Port 1 View  Result Date: 11/28/2020 CLINICAL DATA:  Chest tube removal. EXAM: PORTABLE CHEST 1 VIEW COMPARISON:  11/28/2020 FINDINGS: Right percutaneous pleural drain has been removed in the interval. There is persistent right base consolidative opacity without evidence for residual pneumothorax. Right pleural thickening is similar to prior. Probable left basilar atelectasis with a small left pleural effusion noted. Cardiopericardial silhouette is at upper limits of normal for size. The visualized bony structures of the thorax show no acute abnormality. IMPRESSION: Right chest tube removal without evidence for pneumothorax. Electronically Signed   By: Misty [place] M.D.   On: 11/28/2020 11:38   DG Chest Port 1 View  Result Date: 11/28/2020 CLINICAL DATA:  Pleural effusion. EXAM: PORTABLE CHEST 1 VIEW COMPARISON:  Chest x-ray 11/27/2020, CT chest 11/23/2020 FINDINGS: The heart size and mediastinal contours are unchanged. Persistent bilateral patchy airspace opacities, right greater than left. Increased patchy airspace opacities within the left upper lung zone. No pulmonary edema. Right chest tube in stable position. Slight interval increase in size of a small right pleural effusion. Interval increase in an at least small left pleural effusion. No pneumothorax. No acute osseous abnormality. IMPRESSION: 1. Slight interval increase in a small right pleural effusion with chest tube in stable position. 2. Interval increase in an at least small left pleural effusion. 3. Interval worsening of bilateral patchy airspace opacities, worsened within the left upper lobe. Electronically Signed   By: Iven Finn M.D.   On: 11/28/2020 05:07   DG Chest Port 1 View  Result Date: 11/27/2020 CLINICAL DATA:  Right pleural effusion. EXAM:  PORTABLE CHEST 1 VIEW COMPARISON:  One view chest x-ray 11/26/20 FINDINGS: Heart is enlarged. Right pleural effusion is again noted. No definite pneumothorax is present. Right chest tube remains in place. Right lower lobe airspace disease is similar the prior study. Fluid is present fissure. A smaller left pleural effusion is also similar the prior study with mild left basilar airspace disease. IMPRESSION: 1. Stable bilateral pleural effusions, right greater than left. 2. Bibasilar airspace disease, right greater than left. 3. No pneumothorax. Electronically Signed   By: San Morelle M.D.   On: 11/27/2020 07:57     LOS: 5 days   Antonieta Pert, MD [facility]  11/28/2020, 2:45 PM

## 2020-11-29 LAB — GLUCOSE, CAPILLARY
Glucose-Capillary: 80 mg/dL (ref 70–99)
Glucose-Capillary: 90 mg/dL (ref 70–99)
Glucose-Capillary: 79 mg/dL (ref 70–99)
Glucose-Capillary: 105 mg/dL — ABNORMAL HIGH (ref 70–99)

## 2020-11-29 MED ORDER — FAMOTIDINE 20 MG PO TABS: 20.0000 mg | ORAL_TABLET | Freq: Two times a day (BID) | ORAL | Status: DC | PRN

## 2020-11-29 MED ORDER — DOXYCYCLINE HYCLATE 100 MG PO TABS
100.0000 mg | ORAL_TABLET | Freq: Two times a day (BID) | ORAL | Status: DC
  Administered 2020-11-29 – 2020-11-30 (×2): 100 mg via ORAL
  Filled 2020-11-29 (×3): qty 1

## 2020-11-29 MED ORDER — ALUM & MAG HYDROXIDE-SIMETH 200-200-20 MG/5ML PO SUSP
15.0000 mL | Freq: Four times a day (QID) | ORAL | Status: DC | PRN
  Administered 2020-11-29: 15 mL via ORAL
  Filled 2020-11-29: qty 30

## 2020-11-29 MED ORDER — WHITE PETROLATUM EX OINT
TOPICAL_OINTMENT | CUTANEOUS | Status: AC
  Filled 2020-11-29: qty 28.35

## 2020-11-29 NOTE — Progress Notes (Signed)
[facility] PCCM  Chest tube removed on 2/2, f/u CXR OK PCCM to sign off Please call if questions  Roselie Awkward, MD [facility] Pager: [phone] Cell: [phone] If no response, call [phone]

## 2020-11-29 NOTE — Progress Notes (Signed)
Occupational Therapy Treatment Patient Details Name: Yvette Roberts MRN: [id number] DOB: 01/11/1956 Today's Date: 11/29/2020    History of present illness 65 yo admitted 1/27 with respiratory failure with intubation/extubation same date. Pigtail catheter placed 1/27 for right pleural effusion; changed to a chest tube on 1/28; PMhx: depression, R frozen shoulder, DM   OT comments  Pt much more comfortable without chest tube. Sp02 96% on 3L 02. Performed ambulation with RW and standing grooming with supervision. Reports being able to perform pericare now with chest tube out and can access her feet to don socks. Pt educated in pacing during ADL, seated showering and use of 02 in shower when she is discharged to her brother's home.   Follow Up Recommendations  No OT follow up;Supervision - Intermittent    Equipment Recommendations  3 in 1 bedside commode    Recommendations for Other Services      Precautions / Restrictions Precautions Precautions: Fall Precaution Comments: watch sats       Mobility Bed Mobility               General bed mobility comments: in chair  Transfers Overall transfer level: Needs assistance Equipment used: Rolling walker (2 wheeled) Transfers: Sit to/from Stand Sit to Stand: Supervision              Balance Overall balance assessment: Needs assistance   Sitting balance-Leahy Scale: Good     Standing balance support: Single extremity supported Standing balance-Leahy Scale: Fair Standing balance comment: at sink                           ADL either performed or assessed with clinical judgement   ADL Overall ADL's : Needs assistance/impaired     Grooming: Oral care;Wash/dry hands;Supervision/safety;Standing               Lower Body Dressing: Set up;Sitting/lateral leans Lower Body Dressing Details (indicate cue type and reason): for socks Toilet Transfer: Supervision/safety;Ambulation;RW   Toileting- Clothing  Manipulation and Hygiene: Supervision/safety;Sitting/lateral lean       Functional mobility during ADLs: Supervision/safety;Rolling walker General ADL Comments: on 3L 02     Vision       Perception     Praxis      Cognition Arousal/Alertness: Awake/alert Behavior During Therapy: WFL for tasks assessed/performed Overall Cognitive Status: Within Functional Limits for tasks assessed                                          Exercises     Shoulder Instructions       General Comments      Pertinent Vitals/ Pain       Pain Assessment: No/denies pain  Home Living                                          Prior Functioning/Environment              Frequency  Min 2X/week        Progress Toward Goals  OT Goals(current goals can now be found in the care plan section)  Progress towards OT goals: Progressing toward goals  Acute Rehab OT Goals Patient Stated Goal: return home and to work OT Goal Formulation: With patient  Time For Goal Achievement: 12/08/20 Potential to Achieve Goals: Good  Plan Discharge plan needs to be updated    Co-evaluation                 AM-PAC OT "6 Clicks" Daily Activity     Outcome Measure   Help from another person eating meals?: None Help from another person taking care of personal grooming?: A Little Help from another person toileting, which includes using toliet, bedpan, or urinal?: A Little Help from another person bathing (including washing, rinsing, drying)?: A Little Help from another person to put on and taking off regular upper body clothing?: None Help from another person to put on and taking off regular lower body clothing?: A Little 6 Click Score: 20    End of Session Equipment Utilized During Treatment: Rolling walker;Oxygen  OT Visit Diagnosis: Unsteadiness on feet (R26.81);Other abnormalities of gait and mobility (R26.89);Muscle weakness (generalized) (M62.81);Pain    Activity Tolerance Patient tolerated treatment well   Patient Left in chair;with call bell/phone within reach;with nursing/sitter in room   Nurse Communication          Time: 8875-7972 OT Time Calculation (min): 34 min  Charges: OT General Charges $OT Visit: 1 Visit OT Treatments $Self Care/Home Management : 23-37 mins  Nestor Lewandowsky, OTR/L Acute Rehabilitation Services Pager: [phone] Office: [phone]   Malka So 11/29/2020, 1:51 PM

## 2020-11-29 NOTE — Progress Notes (Signed)
PROGRESS NOTE    Yvette Roberts  OQH:[id number] DOB: 09-21-1956 DOA: 11/23/2020 PCP: Pcp, No   Chief Complaint  Patient presents with  . Respiratory Distress  Brief Narrative: 65 year old female morbidly obese with type 2 diabetes, depression arthritis/frozen shoulder, GERD, resistant hypertension presented to the ED on 11/23/2020 with shortness of breath, EMS was summoned and on arrival was alert and awake but en route to the ED dyspnea worsened with increasing oxygen requirement placed on nonrebreather and in the ED urgently intubated. Patient was admitted to ICU found to have large exudative pleural effusion with collapse of right lung, requiring chest tube drainage 1/27 , extubated self 1/27.  1/29:transfer to floor bed 1/29-chest x-ray with right apical pneumothorax 10 mm-chest tube placed on suction 2/1-chest tube removed  Subjective: Seen and examined this morning resting comfortably on the bedside chair.  On 3 L nasal cannula oxygen. Reports she does not feel well today "stomach is rumbling"  Assessment & Plan:  Acute hypoxemic respiratory failure needing intubation on admission secondary to pneumonia/pleural effusion-self extubated 1/27.  On 3 L nasal cannula.  Home oxygen has been arranged. Cont the same. Fu w/p pulm as OP  Exudative pleural effusion/community-acquired bacterial pneumonia involving RML and RLL/Rt apical pneumothorax ( new on 1/29): On ceftriaxone/Flagyl  Since 1.29.22, already received azithromycin and doxycycline.blood culture no growth so far, pleural fluid culture no growth.   Chest tube was removed 2/1. Discussed w/ Dr. [place] Bells 11/29/20 and can discharge on Doxycycline for 5 more days, will change to po doxy. She will need outpatient pulmonary follow-up. Cont probiotic while on antibiotics.   Severe sepsis POA with leukocytosis 16.5, tachypnea, hypoxia, AKI: Sepsis parameters resolved.  Leukocytosis is resolved.  She is afebrile procalcitonin has down  trended. Recent Labs  Lab 11/23/20 0849 11/24/20 0156 11/25/20 0036 11/25/20 1123 11/26/20 0143 11/27/20 0243 11/28/20 0024  WBC  --  24.7* 17.8*  --  12.8* 8.6 8.1  PROCALCITON 1.76  --   --  0.63 0.58 0.33  --    GERD-increase PPI to twice daily as he is complaining of reflux symptoms, also continue Maalox, add probiotics  Diarrhea discontinued Colace added Imodium.  No leukocytosis no abdomen tenderness.  Added probiotic, supportive measures.  Hypertension  blood pressure is stable.Continue to hold Norvasc Cozaar Aldactone.  History of depression mood is stable on Prozac.   Prolonged QTC on EKG on 12/27, resolved to 447 on 1/29 EKG  Type 2 diabetes mellitus without complication: Blood sugar is stable.  Stable hemoglobin A1c 6.3 11/23/20. Cont asa.  On sliding scale setting.  Hyponatremia; resolved  Hypokalemia repleted orally  Morbid obesity BMI 35.5 she will benefit with weight loss/lifestyle changes and PCP follow-up, outpatient sleep apnea evaluation.  Insomnia  resting well on nightly trazodone   Nutrition: Diet Order            Diet Carb Modified Fluid consistency: Thin; Room service appropriate? Yes  Diet effective now                 Nutrition Problem: Inadequate oral intake Etiology: inability to eat Signs/Symptoms: NPO status Interventions: Ensure Enlive (each supplement provides 350kcal and 20 grams of protein),MVI  Body mass index is 34.52 kg/m.  DVT prophylaxis: heparin injection 5,000 Units Start: 11/23/20 1400 SCDs Start: 11/23/20 5465 Code Status:   Code Status: Full Code Family Communication: plan of care discussed with patient at bedside.  Status is: Inpatient Remains inpatient appropriate because:IV treatments appropriate due to intensity of  illness or inability to take PO and Inpatient level of care appropriate due to severity of illness  Dispo: The patient is from: Home              Anticipated d/c is to: Home               Anticipated d/c date is: Tomorrow               Patient currently is not medically stable to d/c.   Difficult to place patient No  Consultants:see note  Procedures:see note  Culture/Microbiology    Component Value Date/Time   SDES BLOOD RIGHT HAND 11/23/2020 0905   SPECREQUEST  11/23/2020 0905    BOTTLES DRAWN AEROBIC AND ANAEROBIC Blood Culture adequate volume   CULT  11/23/2020 0905    NO GROWTH 5 DAYS Performed at [facility], [place] [address], [place], [place] [address]    REPTSTATUS 11/28/2020 FINAL 11/23/2020 6144    Other culture-see note  Medications: Scheduled Meds: . white petrolatum      . aspirin  81 mg Oral Daily  . Chlorhexidine Gluconate Cloth  6 each Topical Daily  . cholecalciferol  2,000 Units Oral Daily  . feeding supplement  237 mL Oral TID BM  . FLUoxetine  40 mg Oral Daily  . heparin  5,000 Units Subcutaneous Q8H  . insulin aspart  0-15 Units Subcutaneous TID WC  . insulin aspart  0-5 Units Subcutaneous QHS  . lidocaine  1 patch Transdermal Q24H  . metroNIDAZOLE  500 mg Oral Q8H  . multivitamin with minerals  1 tablet Oral Daily  . pantoprazole  40 mg Oral BID  . saccharomyces boulardii  250 mg Oral BID  . sodium chloride flush  10 mL Intracatheter Q8H   Continuous Infusions: . cefTRIAXone (ROCEPHIN)  IV 2 g (11/28/20 1243)    Antimicrobials: Anti-infectives (From admission, onward)   Start     Dose/Rate Route Frequency Ordered Stop   11/25/20 1400  metroNIDAZOLE (FLAGYL) tablet 500 mg        500 mg Oral Every 8 hours 11/25/20 1148     11/25/20 1300  cefTRIAXone (ROCEPHIN) 2 g in sodium chloride 0.9 % 100 mL IVPB        2 g 200 mL/hr over 30 Minutes Intravenous Every 24 hours 11/25/20 1148     11/24/20 1800  azithromycin (ZITHROMAX) tablet 500 mg  Status:  Discontinued        500 mg Oral Daily 11/24/20 1156 11/24/20 1212   11/24/20 1300  azithromycin (ZITHROMAX) tablet 500 mg  Status:  Discontinued        500 mg Oral Daily 11/24/20 1212  11/25/20 1148   11/24/20 1000  doxycycline (VIBRA-TABS) tablet 100 mg  Status:  Discontinued        100 mg Oral Every 12 hours 11/24/20 0913 11/25/20 1148     Objective: Vitals: Today's Vitals   11/28/20 1143 11/28/20 1338 11/28/20 2111 11/28/20 2127  BP:  122/61 127/80   Pulse:  85 83   Resp:  18 20   Temp:  97.9 F (36.6 C) 98.3 F (36.8 C)   TempSrc:  Oral Oral   SpO2:  99% 98%   Weight:      Height:      PainSc: 4    4     Intake/Output Summary (Last 24 hours) at 11/29/2020 1434 Last data filed at 11/29/2020 1300 Gross per 24 hour  Intake 700 ml  Output --  Net 700 ml   Filed Weights   11/23/20 0322 11/23/20 0826 11/24/20 0500  Weight: 111 kg 103.6 kg 97 kg   Weight change:   Intake/Output from previous day: 02/01 0701 - 02/02 0700 In: 20 [I.V.:10] Out: -  Intake/Output this shift: Total I/O In: 700 [P.O.:700] Out: -  Filed Weights   11/23/20 0322 11/23/20 0826 11/24/20 0500  Weight: 111 kg 103.6 kg 97 kg    Examination: General exam: AAOx3 , based on bedside chair, mildly upset. HEENT:Oral mucosa moist, Ear/Nose WNL grossly, dentition normal. Respiratory system: bilaterally basal crackles more on the right, on 3l [place],no use of accessory muscle Cardiovascular system: S1 & S2 +, No JVD,. Gastrointestinal system: Abdomen soft, NT,ND, BS+ Nervous System:Alert, awake, moving extremities and grossly nonfocal Extremities: No edema, distal peripheral pulses palpable.  Skin: No rashes,no icterus. MSK: Normal muscle bulk,tone, power   Data Reviewed: I have personally reviewed following labs and imaging studies CBC: Recent Labs  Lab 11/23/20 0324 11/23/20 0421 11/24/20 0156 11/25/20 0036 11/26/20 0143 11/27/20 0243 11/28/20 0024  WBC 16.5*  --  24.7* 17.8* 12.8* 8.6 8.1  NEUTROABS 10.7*  --   --   --   --   --   --   HGB 14.4   < > 13.3 11.9* 11.4* 10.5* 10.4*  HCT 45.3   < > 39.4 36.6 35.0* 30.7* 31.3*  MCV 86.0  --  82.9 84.1 83.7 82.3 82.6  PLT 474*   --  368 306 272 252 287   < > = values in this interval not displayed.   Basic Metabolic Panel: Recent Labs  Lab 11/23/20 0324 11/23/20 0421 11/24/20 0156 11/25/20 0036 11/26/20 0143 11/27/20 0243 11/28/20 0024  NA 132*   < > 134* 133* 134* 135 133*  K 4.5   < > 5.2* 4.0 3.6 3.2* 3.3*  CL 96*  --  99 101 100 103 100  CO2 22  --  25 21* 23 23 23   GLUCOSE 212*  --  95 111* 101* 97 104*  BUN 21  --  23 12 9  6* 5*  CREATININE 1.28*  --  0.94 0.72 0.64 0.58 0.55  CALCIUM 9.5  --  9.0 8.5* 8.4* 8.2* 8.4*  MG 2.6*  --  2.5*  --   --   --   --    < > = values in this interval not displayed.   GFR: Estimated Creatinine Clearance: 82.3 mL/min (by C-G formula based on SCr of 0.55 mg/dL). Liver Function Tests: Recent Labs  Lab 11/23/20 0324 11/23/20 0849  AST 23  --   ALT 23  --   ALKPHOS 63  --   BILITOT 0.9  --   PROT 8.2* 7.0  ALBUMIN 3.4*  --    No results for input(s): LIPASE, AMYLASE in the last 168 hours. No results for input(s): AMMONIA in the last 168 hours. Coagulation Profile: No results for input(s): INR, PROTIME in the last 168 hours. Cardiac Enzymes: No results for input(s): CKTOTAL, CKMB, CKMBINDEX, TROPONINI in the last 168 hours. BNP (last 3 results) No results for input(s): PROBNP in the last 8760 hours. HbA1C: No results for input(s): HGBA1C in the last 72 hours. CBG: Recent Labs  Lab 11/28/20 1229 11/28/20 1640 11/28/20 2109 11/29/20 0737 11/29/20 1149  GLUCAP 111* 83 104* 80 90   Lipid Profile: No results for input(s): CHOL, HDL, LDLCALC, TRIG, CHOLHDL, LDLDIRECT in the last 72 hours. Thyroid Function Tests:  No results for input(s): TSH, T4TOTAL, FREET4, T3FREE, THYROIDAB in the last 72 hours. Anemia Panel: No results for input(s): VITAMINB12, FOLATE, FERRITIN, TIBC, IRON, RETICCTPCT in the last 72 hours. Sepsis Labs: Recent Labs  Lab 11/23/20 0849 11/25/20 1123 11/26/20 0143 11/27/20 0243  PROCALCITON 1.76 0.63 0.58 0.33    Recent  Results (from the past 240 hour(s))  SARS Coronavirus 2 by RT PCR (hospital order, performed in [facility] hospital lab) Nasopharyngeal Nasopharyngeal Swab     Status: None   Collection Time: 11/23/20  3:06 AM   Specimen: Nasopharyngeal Swab  Result Value Ref Range Status   SARS Coronavirus 2 NEGATIVE NEGATIVE Final    Comment: (NOTE) SARS-CoV-2 target nucleic acids are NOT DETECTED.  The SARS-CoV-2 RNA is generally detectable in upper and lower respiratory specimens during the acute phase of infection. The lowest concentration of SARS-CoV-2 viral copies this assay can detect is 250 copies / mL. A negative result does not preclude SARS-CoV-2 infection and should not be used as the sole basis for treatment or other patient management decisions.  A negative result may occur with improper specimen collection / handling, submission of specimen other than nasopharyngeal swab, presence of viral mutation(s) within the areas targeted by this assay, and inadequate number of viral copies (<250 copies / mL). A negative result must be combined with clinical observations, patient history, and epidemiological information.  Fact Sheet for Patients:   StrictlyIdeas.no  Fact Sheet for Healthcare Providers: BankingDealers.co.za  This test is not yet approved or  cleared by the [place] FDA and has been authorized for detection and/or diagnosis of SARS-CoV-2 by FDA under an Emergency Use Authorization (EUA).  This EUA will remain in effect (meaning this test can be used) for the duration of the COVID-19 declaration under Section 564(b)(1) of the Act, 21 U.S.C. section 360bbb-3(b)(1), unless the authorization is terminated or revoked sooner.  Performed at [facility], [place] [address] [place], [place] [address]   Body fluid culture (includes gram stain)     Status: None   Collection Time: 11/23/20  7:49 AM   Specimen: Pleural Fluid   Result Value Ref Range Status   Specimen Description PLEURAL  Final   Special Requests NONE  Final   Gram Stain NO WBC SEEN NO ORGANISMS SEEN   Final   Culture   Final    NO GROWTH 3 DAYS Performed at [facility], [place] [address] [place], [place] [address]    Report Status 11/26/2020 FINAL  Final  Culture, blood (routine x 2)     Status: None   Collection Time: 11/23/20  8:49 AM   Specimen: BLOOD  Result Value Ref Range Status   Specimen Description BLOOD LEFT ANTECUBITAL  Final   Special Requests   Final    BOTTLES DRAWN AEROBIC AND ANAEROBIC Blood Culture results may not be optimal due to an inadequate volume of blood received in culture bottles   Culture   Final    NO GROWTH 5 DAYS Performed at [facility], [place] [address], [place], [place] [address]    Report Status 11/28/2020 FINAL  Final  Culture, blood (routine x 2)     Status: None   Collection Time: 11/23/20  9:05 AM   Specimen: BLOOD RIGHT HAND  Result Value Ref Range Status   Specimen Description BLOOD RIGHT HAND  Final   Special Requests   Final    BOTTLES DRAWN AEROBIC AND ANAEROBIC Blood Culture adequate  volume   Culture   Final    NO GROWTH 5 DAYS Performed at [facility], [place] [address], [place], [place] [address]    Report Status 11/28/2020 FINAL  Final  MRSA PCR Screening     Status: None   Collection Time: 11/23/20  9:14 AM   Specimen: Nasal Mucosa; Nasopharyngeal  Result Value Ref Range Status   MRSA by PCR NEGATIVE NEGATIVE Final    Comment:        The GeneXpert MRSA Assay (FDA approved for NASAL specimens only), is one component of a comprehensive MRSA colonization surveillance program. It is not intended to diagnose MRSA infection nor to guide or monitor treatment for MRSA infections. Performed at [facility], [place] [address], [place], [place] [address]      Radiology Studies: DG Chest Port 1 View  Result Date: 11/28/2020 CLINICAL DATA:  Chest tube removal.  EXAM: PORTABLE CHEST 1 VIEW COMPARISON:  11/28/2020 FINDINGS: Right percutaneous pleural drain has been removed in the interval. There is persistent right base consolidative opacity without evidence for residual pneumothorax. Right pleural thickening is similar to prior. Probable left basilar atelectasis with a small left pleural effusion noted. Cardiopericardial silhouette is at upper limits of normal for size. The visualized bony structures of the thorax show no acute abnormality. IMPRESSION: Right chest tube removal without evidence for pneumothorax. Electronically Signed   By: Misty Stanley M.D.   On: 11/28/2020 11:38   DG Chest Port 1 View  Result Date: 11/28/2020 CLINICAL DATA:  Pleural effusion. EXAM: PORTABLE CHEST 1 VIEW COMPARISON:  Chest x-ray 11/27/2020, CT chest 11/23/2020 FINDINGS: The heart size and mediastinal contours are unchanged. Persistent bilateral patchy airspace opacities, right greater than left. Increased patchy airspace opacities within the left upper lung zone. No pulmonary edema. Right chest tube in stable position. Slight interval increase in size of a small right pleural effusion. Interval increase in an at least small left pleural effusion. No pneumothorax. No acute osseous abnormality. IMPRESSION: 1. Slight interval increase in a small right pleural effusion with chest tube in stable position. 2. Interval increase in an at least small left pleural effusion. 3. Interval worsening of bilateral patchy airspace opacities, worsened within the left upper lobe. Electronically Signed   By: Iven Finn M.D.   On: 11/28/2020 05:07     LOS: 6 days   Antonieta Pert, MD [facility]  11/29/2020, 2:34 PM

## 2020-11-29 NOTE — Progress Notes (Signed)
Physical Therapy Treatment Patient Details Name: Yvette Roberts MRN: [id number] DOB: 1956-10-22 Today's Date: 11/29/2020    History of Present Illness 65 yo admitted 1/27 with respiratory failure with intubation/extubation same date. Pigtail catheter placed 1/27 for right pleural effusion; changed to a chest tube on 1/28; chest tube removed on 2/1; PMhx: depression, R frozen shoulder, DM    PT Comments    Patient progressing towards physical therapy goals. Patient ambulated 120' with RW and min guard for safety. Patient supervision for bed mobility and transfers. Patient ambulated 2' to bed with no AD and min guard, no LOB noted, however unsteady. Patient on 3L O2 [place] throughout session with spO2 maintaining >95%. Patient continues to be limited by generalized weakness, decreased activity tolerance, impaired balance. Continue to recommend OPPT following discharge to maximize functional mobility and strength.     Follow Up Recommendations  Outpatient PT     Equipment Recommendations  Other (comment) (Rollator)    Recommendations for Other Services       Precautions / Restrictions Precautions Precautions: Fall Precaution Comments: watch sats Restrictions Weight Bearing Restrictions: No    Mobility  Bed Mobility Overal bed mobility: Needs Assistance Bed Mobility: Sit to Supine       Sit to supine: Supervision   General bed mobility comments: in recliner on arrival  Transfers Overall transfer level: Needs assistance Equipment used: Rolling Adrieanna Boteler (2 wheeled) Transfers: Sit to/from Stand Sit to Stand: Supervision            Ambulation/Gait Ambulation/Gait assistance: Min guard Gait Distance (Feet): 120 Feet Assistive device: Rolling [place] Grindstaff (2 wheeled) Gait Pattern/deviations: Step-through pattern     General Gait Details: Cues to self monitor for activity tolerance. SpO2 maintained >95% throughout ambulation on 3L O2 [place]   Stairs             Wheelchair  Mobility    Modified Rankin (Stroke Patients Only)       Balance Overall balance assessment: Needs assistance Sitting-balance support: No upper extremity supported;Feet supported Sitting balance-Leahy Scale: Good     Standing balance support: Single extremity supported Standing balance-Leahy Scale: Fair Standing balance comment: at times would wave at someone passing while ambulating                            Cognition Arousal/Alertness: Awake/alert Behavior During Therapy: WFL for tasks assessed/performed Overall Cognitive Status: Within Functional Limits for tasks assessed                                        Exercises      General Comments General comments (skin integrity, edema, etc.): On 3L O2 [place] with spO2 maintaining >95% throughout session      Pertinent Vitals/Pain Pain Assessment: No/denies pain    Home Living                      Prior Function            PT Goals (current goals can now be found in the care plan section) Acute Rehab PT Goals Patient Stated Goal: return home and to work PT Goal Formulation: With patient Time For Goal Achievement: 12/08/20 Potential to Achieve Goals: Good Progress towards PT goals: Progressing toward goals    Frequency    Min 3X/week      PT Plan  Current plan remains appropriate    Co-evaluation              AM-PAC PT "6 Clicks" Mobility   Outcome Measure  Help needed turning from your back to your side while in a flat bed without using bedrails?: None Help needed moving from lying on your back to sitting on the side of a flat bed without using bedrails?: None Help needed moving to and from a bed to a chair (including a wheelchair)?: None Help needed standing up from a chair using your arms (e.g., wheelchair or bedside chair)?: A Little Help needed to walk in hospital room?: A Little Help needed climbing 3-5 steps with a railing? : A Little 6 Click Score: 21     End of Session Equipment Utilized During Treatment: Gait belt;Oxygen Activity Tolerance: Patient tolerated treatment well Patient left: in bed;with call bell/phone within reach Nurse Communication: Mobility status PT Visit Diagnosis: Other abnormalities of gait and mobility (R26.89);Muscle weakness (generalized) (M62.81)     Time: 2951-8841 PT Time Calculation (min) (ACUTE ONLY): 25 min  Charges:  $Therapeutic Activity: 23-37 mins                     Antwone Capozzoli A. Gilford Rile PT, DPT Acute Rehabilitation Services Pager [phone] Office [phone]    Alda Lea 11/29/2020, 2:27 PM

## 2020-11-30 ENCOUNTER — Other Ambulatory Visit (HOSPITAL_COMMUNITY): Payer: Self-pay | Admitting: Internal Medicine

## 2020-11-30 LAB — GLUCOSE, CAPILLARY
Glucose-Capillary: 102 mg/dL — ABNORMAL HIGH (ref 70–99)
Glucose-Capillary: 98 mg/dL (ref 70–99)

## 2020-11-30 MED ORDER — GUAIFENESIN 100 MG/5ML PO SOLN: 10.0000 mL | ORAL | 0 refills | Status: DC | PRN

## 2020-11-30 MED ORDER — TRAZODONE HCL 50 MG PO TABS: 50.0000 mg | ORAL_TABLET | Freq: Every evening | ORAL | 0 refills | Status: DC | PRN

## 2020-11-30 MED ORDER — LIDOCAINE 5 % EX PTCH: 1.0000 | MEDICATED_PATCH | CUTANEOUS | 0 refills | Status: DC

## 2020-11-30 MED ORDER — SACCHAROMYCES BOULARDII 250 MG PO CAPS: 250.0000 mg | ORAL_CAPSULE | Freq: Two times a day (BID) | ORAL | 0 refills | Status: DC

## 2020-11-30 MED ORDER — DOXYCYCLINE HYCLATE 100 MG PO TABS: 100.0000 mg | ORAL_TABLET | Freq: Two times a day (BID) | ORAL | 0 refills | Status: DC

## 2020-11-30 MED FILL — DOXYCYCLINE HYCLATE 100 MG: 100 | 4 days supply | Qty: 8 | Fill #0

## 2020-11-30 MED FILL — traZODone HCL 50 MG TABS: 50 | 30 days supply | Qty: 30 | Fill #0

## 2020-11-30 NOTE — Progress Notes (Signed)
Physical Therapy Treatment Patient Details Name: Yvette Roberts MRN: [id number] DOB: 1956/08/19 Today's Date: 11/30/2020    History of Present Illness 65 yo admitted 1/27 with respiratory failure with intubation/extubation same date. Pigtail catheter placed 1/27 for right pleural effusion; changed to a chest tube on 1/28; chest tube removed on 2/1; PMhx: depression, R frozen shoulder, DM    PT Comments    Patient making substantial progress during admission. Patient ambulated 200' with RW and supervision, spO2 maintained >95% on 2L O2 [place]. Patient continues to be limited by decreased activity tolerance, impaired balance, generalized weakness. Educated patient on energy conservation and use of rollator at home. OPPT continues to be appropriate.    Follow Up Recommendations  Outpatient PT     Equipment Recommendations  Other (comment) (rollator)    Recommendations for Other Services       Precautions / Restrictions Precautions Precautions: Fall Precaution Comments: watch sats Restrictions Weight Bearing Restrictions: No    Mobility  Bed Mobility               General bed mobility comments: in recliner on arrival  Transfers Overall transfer level: Needs assistance Equipment used: Rolling Signora Zucco (2 wheeled) Transfers: Sit to/from Stand Sit to Stand: Supervision            Ambulation/Gait Ambulation/Gait assistance: Supervision Gait Distance (Feet): 200 Feet Assistive device: Rolling Teresea Donley (2 wheeled) Gait Pattern/deviations: Step-through pattern     General Gait Details: Cues to self monitor for activity tolerance. SpO2 maintained >95% throughout ambulation on 2L O2 [place]   Stairs             Wheelchair Mobility    Modified Rankin (Stroke Patients Only)       Balance Overall balance assessment: Needs assistance Sitting-balance support: No upper extremity supported;Feet supported Sitting balance-Leahy Scale: Good     Standing balance support:  Bilateral upper extremity supported Standing balance-Leahy Scale: Fair                              Cognition Arousal/Alertness: Awake/alert Behavior During Therapy: WFL for tasks assessed/performed Overall Cognitive Status: Within Functional Limits for tasks assessed                                        Exercises      General Comments        Pertinent Vitals/Pain Pain Assessment: No/denies pain    Home Living                      Prior Function            PT Goals (current goals can now be found in the care plan section) Acute Rehab PT Goals Patient Stated Goal: return home and to work PT Goal Formulation: With patient Time For Goal Achievement: 12/08/20 Potential to Achieve Goals: Good Progress towards PT goals: Progressing toward goals    Frequency    Min 3X/week      PT Plan Current plan remains appropriate    Co-evaluation              AM-PAC PT "6 Clicks" Mobility   Outcome Measure  Help needed turning from your back to your side while in a flat bed without using bedrails?: None Help needed moving from lying on your back to sitting  on the side of a flat bed without using bedrails?: None Help needed moving to and from a bed to a chair (including a wheelchair)?: None Help needed standing up from a chair using your arms (e.g., wheelchair or bedside chair)?: A Little Help needed to walk in hospital room?: A Little Help needed climbing 3-5 steps with a railing? : A Little 6 Click Score: 21    End of Session Equipment Utilized During Treatment: Oxygen Activity Tolerance: Patient tolerated treatment well Patient left: with call bell/phone within reach;in chair Nurse Communication: Mobility status PT Visit Diagnosis: Other abnormalities of gait and mobility (R26.89);Muscle weakness (generalized) (M62.81)     Time: 4643-1427 PT Time Calculation (min) (ACUTE ONLY): 18 min  Charges:  $Therapeutic Activity:  8-22 mins                     Bricia Taher A. Gilford Rile PT, DPT Acute Rehabilitation Services Pager [phone] Office [phone]    Alda Lea 11/30/2020, 2:27 PM

## 2020-11-30 NOTE — Discharge Summary (Signed)
Physician Discharge Summary  Yvette Roberts PQZ:[id number] DOB: Jul 15, 1956 DOA: 11/23/2020  PCP: [name], FNP  Admit date: 11/23/2020 Discharge date: 11/30/2020  Admitted From: home Discharge disposition: home   Recommendations for Outpatient Follow-Up:   1. Will go home on O2-- will need follow up to ensure pleural effusion resolved 2. PRN trazadone-- monitor for serotonin syndrome with prozac 3. Resume BP med as needed 4. BMP 1 week   Discharge Diagnosis:   Principal Problem:   Acute hypoxemic respiratory failure (HCC) Active Problems:   Type 2 diabetes mellitus without complication (HCC)   Morbid obesity (HCC)   Pleural effusion, right   Atelectasis of right lung   Hyponatremia    Discharge Condition: Improved.  Diet recommendation:Carbohydrate-modified.   Wound care: None.  Code status: Full.   History of Present Illness:  This 65 y.o. morbidly obese African-American female presented to [place] emergency department via EMS with complaints of shortness of breath.  According to EMS report, the patient was initially awake, alert and conversant upon their arrival; however, on route to the emergency department, the patient's dyspnea worsened.  She had increasing oxygen requirement, which failed to improve with application of a nonrebreather mask.  She was given albuterol and Solu-Medrol.  In the emergency department she was urgently intubated.  At the time of clinical interview, the patient is still under the effects of RSI medications administered for endotracheal intubation.  Of note, initial chest x-ray revealed opacification of the right hemithorax.  Chest CT suggests complete right lung atelectasis secondary to compression from a large right-sided pleural effusion.    Initial ABG after endotracheal intubation: 7.32/46/283 on FiO2 100%.  Serum sodium 132.  BUN 21, creatinine 1.3.  Troponin-I 9.  BNP 101.   Hospital Course by  Problem:   Acute hypoxemic respiratory failureneeding intubation on admission secondary to pneumonia/pleural effusion-self extubated 1/27.On 3 L nasal cannula.  Home oxygen has been arranged.   Exudative pleural effusion/community-acquired bacterial pneumonia involving RML and RLL/Rt apical pneumothorax ( new on 1/29):Onceftriaxone/Flagyl Since1.29.22, already received azithromycin and doxycycline.blood culture no growth so far, pleural fluid culture no growth.  Chest tube was removed 2/1. Discussed w/ Dr. Lake Bells 11/29/20 and can discharge on Doxycycline for 5 more days. Probiotic while on abx  Severe sepsis POA with leukocytosis 16.5, tachypnea, hypoxia, AKI: Sepsis parameters resolved.  Leukocytosis is resolved.  She is afebrile procalcitonin has down trended.  GERD -PPI -encouraged weight loss  Diarrhea -improved.  Hypertension blood pressure is stable.Continue to hold Norvasc Cozaar Aldactone. -resume as an outpateint  History of depressionmood is stable on Prozac.  Prolonged QTC on EKG on 12/27, resolved to 447 on 1/29 EKG  Type 2 diabetes mellitus without complication:Blood sugar is stable. Stable hemoglobin A1c 6.3 11/23/20. Cont asa.On sliding scale setting.  Hyponatremia;resolved  Hypokalemiarepleted  Morbid obesity BMI 35.5she will benefit with weight loss/lifestyle changes and PCP follow-up, outpatient sleep apnea evaluation.  Insomnia resting well on nightly trazodone      Medical Consultants:   PCCM   Discharge Exam:   Vitals:   11/29/20 2040 11/30/20 0513  BP: (!) 143/81 134/78  Pulse: 92 81  Resp: 16 18  Temp: 98.1 F (36.7 C) 97.8 F (36.6 C)  SpO2: 97% 97%   Vitals:   11/28/20 2111 11/29/20 1300 11/29/20 2040 11/30/20 0513  BP: 127/80 132/73 (!) 143/81 134/78  Pulse: 83 82 92 81  Resp: 20 19 16 18   Temp:  98.3 F (36.8 C) 98.7 F (37.1 C) 98.1 F (36.7 C) 97.8 F (36.6 C)  TempSrc: Oral Oral Oral   SpO2: 98%   97% 97%  Weight:      Height:        General exam: Appears calm and comfortable.   The results of significant diagnostics from this hospitalization (including imaging, microbiology, ancillary and laboratory) are listed below for reference.     Procedures and Diagnostic Studies:   DG Chest 1 View  Result Date: 11/23/2020 CLINICAL DATA:  Chest tube placement on right EXAM: CHEST  1 VIEW COMPARISON:  November 23, 2020 study obtained earlier in the day; chest CT November 23, 2020 FINDINGS: There is now a pigtail catheter at the right base. Endotracheal tube tip is 4.5 cm above the carina. Nasogastric tube tip and side port below the diaphragm. No pneumothorax. There is sizable loculated pleural effusion on the right with areas of superimposed consolidation throughout the right lung. Left lung is clear. Heart size and pulmonary vascularity are normal. No adenopathy. No bone lesions. IMPRESSION: Tube and catheter positions as described. No pneumothorax. Less pleural fluid on the right but with significant persistent apparent partially loculated pleural effusion. Areas of consolidation throughout the right lung as well. There is now aeration of a portion of the medial right mid and lower lung regions compared to earlier in the day. Left lung is clear.  Stable cardiac silhouette. Electronically Signed   By: Lowella Grip III M.D.   On: 11/23/2020 08:07   CT Head Wo Contrast  Result Date: 11/23/2020 CLINICAL DATA:  Altered mental status EXAM: CT HEAD WITHOUT CONTRAST TECHNIQUE: Contiguous axial images were obtained from the base of the skull through the vertex without intravenous contrast. COMPARISON:  None. FINDINGS: Brain: Normal anatomic configuration. No abnormal intra or extra-axial mass lesion or fluid collection. No abnormal mass effect or midline shift. No evidence of acute intracranial hemorrhage or infarct. Ventricular size is normal. Cerebellum unremarkable. Vascular: Unremarkable Skull: Intact  Sinuses/Orbits: Paranasal sinuses are clear. Orbits are unremarkable. Other: Mastoid air cells and middle ear cavities are clear. IMPRESSION: No acute intracranial abnormality.  Normal exam. Electronically Signed   By: Fidela Salisbury MD   On: 11/23/2020 05:31   CT CHEST WO CONTRAST  Result Date: 11/23/2020 CLINICAL DATA:  Right pleural effusion, chest tube placement EXAM: CT CHEST WITHOUT CONTRAST TECHNIQUE: Multidetector CT imaging of the chest was performed following the standard protocol without IV contrast. COMPARISON:  11/23/2020 FINDINGS: Cardiovascular: The heart is unremarkable without pericardial effusion. Unenhanced imaging of the great vessels demonstrates normal caliber of the thoracic aorta. Mediastinum/Nodes: Mediastinal shift seen previously has resolved. Stable borderline enlarged precarinal lymph node measuring up to 9 mm. No pathologic adenopathy. Thyroid, trachea, and esophagus are unremarkable. Lungs/Pleura: There has been near complete evacuation of the right pleural effusion seen previously, with interval placement of the pigtail drainage catheter in the right posterior costophrenic angle. Trace residual pleural fluid, partially loculated. Small amount of gas within the right hemithorax consistent with indwelling catheter. There is dense consolidation within the right lung, greatest in the medial and lower lobes, compatible with pneumonia. Hypoventilatory changes are seen within the dependent left lower lobe. Upper Abdomen: There is an indeterminate 2.4 cm hypodensity within the right lobe liver, reference image 109/3. The remainder of the upper abdomen is unremarkable. Musculoskeletal: No acute or destructive bony lesions. Moderate mid to lower thoracic spondylosis. Reconstructed images demonstrate no additional findings. IMPRESSION: 1. Near complete evacuation of  the right pleural effusion after right-sided pigtail drainage catheter placement. Trace residual effusion is partially  loculated. 2. Dense consolidation throughout the right lung, greatest at the right lung base. Findings are consistent with a combination of pneumonia and atelectasis. Follow-up imaging after completion of appropriate medical management is recommended to exclude underlying neoplasm. 3. Indeterminate hypodensity right lobe liver. If further evaluation is desired, dedicated liver CT or MRI may be useful. Electronically Signed   By: Randa Ngo M.D.   On: 11/23/2020 23:12   CT Chest Wo Contrast  Result Date: 11/23/2020 CLINICAL DATA:  Abnormal chest x-ray, hypoxia EXAM: CT CHEST WITHOUT CONTRAST TECHNIQUE: Multidetector CT imaging of the chest was performed following the standard protocol without IV contrast. COMPARISON:  None. FINDINGS: Cardiovascular: Cardiac size within normal limits. No significant coronary artery calcification. No pericardial effusion. Central pulmonary arteries are of normal caliber. The thoracic aorta is unremarkable. Mediastinum/Nodes: There is mediastinal shift to the left secondary to the large right pleural effusion. Thyroid unremarkable. No pathologic mediastinal adenopathy. Nasogastric tube extends into the abdomen and is a seen looped within the visualized stomach. Lungs/Pleura: The right hemithorax is not fully included on this examination and the inferior pleural space is excluded. However, a large right pleural effusion is present with near complete collapse of the right lung. The pleural fluid is relatively low attenuation suggesting a transudative effusion. No definite central obstructing mass is identified though imaging of the decompressed pulmonary parenchyma is limited in absence of contrast administration. Endotracheal tube seen within the trachea approximately 2.8 cm above the carina. Mild left basilar atelectasis. No pneumothorax. Upper Abdomen: No acute abnormality. Musculoskeletal: Osseous structures are age-appropriate. No lytic or blastic bone lesions are identified.  IMPRESSION: Large right pleural effusion with near complete collapse of the right lung and mediastinal shift to the left. Electronically Signed   By: Fidela Salisbury MD   On: 11/23/2020 05:39   DG Chest Port 1 View  Result Date: 11/24/2020 CLINICAL DATA:  Pleural effusion, respiratory failure EXAM: PORTABLE CHEST 1 VIEW COMPARISON:  11/23/2020 FINDINGS: Interval extubation. Pulmonary insufflation has diminished slightly in the interval. Right basilar pigtail chest tube is again identified. Previously noted large right pleural effusion has been largely evacuated though small pleural fluid persists. Right basilar consolidation is noted. Additional airspace infiltrate within the a right upper lobe. Mild left basilar atelectasis. No pneumothorax. Cardiac size within normal limits. IMPRESSION: Interval extubation with slight interval decrease in pulmonary insufflation. Right basilar pigtail chest tube placement with near complete evacuation of right pleural effusion. Persistent right basilar consolidation and right upper lobe airspace infiltrate, likely infectious. Electronically Signed   By: Fidela Salisbury MD   On: 11/24/2020 04:19   DG Chest Portable 1 View  Result Date: 11/23/2020 CLINICAL DATA:  Respiratory failure, endotracheal intubation EXAM: PORTABLE CHEST 1 VIEW COMPARISON:  11/23/2020 FINDINGS: Endotracheal tube is seen within the right mainstem bronchus roughly 2 cm beyond the carina. Nasogastric tube is seen looped within the gastric fundus. There is improved aeration of the right lung, likely related to preferential right lung aeration, which is remains largely collapsed. A large right pleural effusion is again seen. Left lung is clear and there is improved insufflation of the left lung. No pneumothorax or pleural effusion on the left. Cardiac size within normal limits. Pulmonary vascularity is normal. IMPRESSION: Right mainstem intubation. Withdrawal of the endotracheal tube by 4.5-5 cm would more  optimally position the catheter within the trachea. Large right pleural effusion with near complete  collapse of the right lung. These results were called by telephone at the time of interpretation on 11/23/2020 at 3:36 am to provider DAVID [facility] , who verbally acknowledged these results. Electronically Signed   By: Fidela Salisbury MD   On: 11/23/2020 03:36   DG Chest Portable 1 View  Result Date: 11/23/2020 CLINICAL DATA:  Shortness of breath for 2 weeks, worse today. EXAM: PORTABLE CHEST 1 VIEW COMPARISON:  None. FINDINGS: Shallow inspiration. Complete opacification of the right hemithorax. This could be due to either or combination of consolidation, atelectasis, and/or effusion. Focal infiltration in the left lung base behind the heart. Heart size is obscured by the parenchymal process. No pneumothorax. IMPRESSION: Complete opacification of the right hemithorax. Focal infiltration in the left lung base behind the heart. Electronically Signed   By: Lucienne Capers M.D.   On: 11/23/2020 03:14     Labs:   Basic Metabolic Panel: Recent Labs  Lab 11/24/20 0156 11/25/20 0036 11/26/20 0143 11/27/20 0243 11/28/20 0024  NA 134* 133* 134* 135 133*  K 5.2* 4.0 3.6 3.2* 3.3*  CL 99 101 100 103 100  CO2 25 21* 23 23 23   GLUCOSE 95 111* 101* 97 104*  BUN 23 12 9  6* 5*  CREATININE 0.94 0.72 0.64 0.58 0.55  CALCIUM 9.0 8.5* 8.4* 8.2* 8.4*  MG 2.5*  --   --   --   --    GFR Estimated Creatinine Clearance: 82.3 mL/min (by C-G formula based on SCr of 0.55 mg/dL). Liver Function Tests: No results for input(s): AST, ALT, ALKPHOS, BILITOT, PROT, ALBUMIN in the last 168 hours. No results for input(s): LIPASE, AMYLASE in the last 168 hours. No results for input(s): AMMONIA in the last 168 hours. Coagulation profile No results for input(s): INR, PROTIME in the last 168 hours.  CBC: Recent Labs  Lab 11/24/20 0156 11/25/20 0036 11/26/20 0143 11/27/20 0243 11/28/20 0024  WBC 24.7* 17.8* 12.8* 8.6  8.1  HGB 13.3 11.9* 11.4* 10.5* 10.4*  HCT 39.4 36.6 35.0* 30.7* 31.3*  MCV 82.9 84.1 83.7 82.3 82.6  PLT 368 306 272 252 287   Cardiac Enzymes: No results for input(s): CKTOTAL, CKMB, CKMBINDEX, TROPONINI in the last 168 hours. BNP: Invalid input(s): POCBNP CBG: Recent Labs  Lab 11/29/20 1149 11/29/20 1658 11/29/20 2044 11/30/20 0749 11/30/20 1243  GLUCAP 90 79 105* 102* 98   D-Dimer No results for input(s): DDIMER in the last 72 hours. Hgb A1c No results for input(s): HGBA1C in the last 72 hours. Lipid Profile No results for input(s): CHOL, HDL, LDLCALC, TRIG, CHOLHDL, LDLDIRECT in the last 72 hours. Thyroid function studies No results for input(s): TSH, T4TOTAL, T3FREE, THYROIDAB in the last 72 hours.  Invalid input(s): FREET3 Anemia work up No results for input(s): VITAMINB12, FOLATE, FERRITIN, TIBC, IRON, RETICCTPCT in the last 72 hours. Microbiology Recent Results (from the past 240 hour(s))  SARS Coronavirus 2 by RT PCR (hospital order, performed in [facility] hospital lab) Nasopharyngeal Nasopharyngeal Swab     Status: None   Collection Time: 11/23/20  3:06 AM   Specimen: Nasopharyngeal Swab  Result Value Ref Range Status   SARS Coronavirus 2 NEGATIVE NEGATIVE Final    Comment: (NOTE) SARS-CoV-2 target nucleic acids are NOT DETECTED.  The SARS-CoV-2 RNA is generally detectable in upper and lower respiratory specimens during the acute phase of infection. The lowest concentration of SARS-CoV-2 viral copies this assay can detect is 250 copies / mL. A negative result does not preclude SARS-CoV-2  infection and should not be used as the sole basis for treatment or other patient management decisions.  A negative result may occur with improper specimen collection / handling, submission of specimen other than nasopharyngeal swab, presence of viral mutation(s) within the areas targeted by this assay, and inadequate number of viral copies (<250 copies / mL). A  negative result must be combined with clinical observations, patient history, and epidemiological information.  Fact Sheet for Patients:   StrictlyIdeas.no  Fact Sheet for Healthcare Providers: BankingDealers.co.za  This test is not yet approved or  cleared by the [place] FDA and has been authorized for detection and/or diagnosis of SARS-CoV-2 by FDA under an Emergency Use Authorization (EUA).  This EUA will remain in effect (meaning this test can be used) for the duration of the COVID-19 declaration under Section 564(b)(1) of the Act, 21 U.S.C. section 360bbb-3(b)(1), unless the authorization is terminated or revoked sooner.  Performed at [facility], [place] [address], [place], [place] [address]   Body fluid culture (includes gram stain)     Status: None   Collection Time: 11/23/20  7:49 AM   Specimen: Pleural Fluid  Result Value Ref Range Status   Specimen Description PLEURAL  Final   Special Requests NONE  Final   Gram Stain NO WBC SEEN NO ORGANISMS SEEN   Final   Culture   Final    NO GROWTH 3 DAYS Performed at [facility], [place] [address] [place], [place] [address]    Report Status 11/26/2020 FINAL  Final  Culture, blood (routine x 2)     Status: None   Collection Time: 11/23/20  8:49 AM   Specimen: BLOOD  Result Value Ref Range Status   Specimen Description BLOOD LEFT ANTECUBITAL  Final   Special Requests   Final    BOTTLES DRAWN AEROBIC AND ANAEROBIC Blood Culture results may not be optimal due to an inadequate volume of blood received in culture bottles   Culture   Final    NO GROWTH 5 DAYS Performed at [facility], [place] [address] [place], [place] [address]    Report Status 11/28/2020 FINAL  Final  Culture, blood (routine x 2)     Status: None   Collection Time: 11/23/20  9:05 AM   Specimen: BLOOD RIGHT HAND  Result Value Ref Range Status   Specimen Description BLOOD RIGHT HAND  Final    Special Requests   Final    BOTTLES DRAWN AEROBIC AND ANAEROBIC Blood Culture adequate volume   Culture   Final    NO GROWTH 5 DAYS Performed at [facility], [place] [address], [place], [place] [address]    Report Status 11/28/2020 FINAL  Final  MRSA PCR Screening     Status: None   Collection Time: 11/23/20  9:14 AM   Specimen: Nasal Mucosa; Nasopharyngeal  Result Value Ref Range Status   MRSA by PCR NEGATIVE NEGATIVE Final    Comment:        The GeneXpert MRSA Assay (FDA approved for NASAL specimens only), is one component of a comprehensive MRSA colonization surveillance program. It is not intended to diagnose MRSA infection nor to guide or monitor treatment for MRSA infections. Performed at [facility], [place] [address] [place], [place] [address]      Discharge Instructions:   Discharge Instructions    Ambulatory referral to Physical Therapy   Complete by: As directed    Iontophoresis - 4  mg/ml of dexamethasone: No   T.E.N.S. Unit Evaluation and Dispense as Indicated: No   Diet Carb Modified   Complete by: As directed    Discharge instructions   Complete by: As directed    Can use an over the counter florastor or other probiotic-- can get from [facility] Salonpas with lidocaine is also OTC Follow up with PCP to ensure fluid has completely resolved Home O2   Increase activity slowly   Complete by: As directed      Allergies as of 11/30/2020      Reactions   Eggs Or Egg-derived Products Hives   Influenza Vaccines Hives   Peanut-containing Drug Products Hives   Penicillins Hives   Sulfa Antibiotics Hives   Codeine Rash      Medication List    STOP taking these medications   amLODipine 10 MG tablet Commonly known as: NORVASC   carvedilol 12.5 MG tablet Commonly known as: COREG   losartan 100 MG tablet Commonly known as: COZAAR   magnesium hydroxide 400 MG/5ML suspension Commonly known as: MILK OF MAGNESIA   spironolactone 25 MG  tablet Commonly known as: ALDACTONE     TAKE these medications   Accu-Chek FastClix Lancets Misc Use as directed   Accu-Chek Guide test strip Generic drug: glucose blood Use as directed once a day   aspirin 81 MG tablet Take 81 mg by mouth daily.   doxycycline 100 MG tablet Commonly known as: VIBRA-TABS Take 1 tablet (100 mg total) by mouth every 12 (twelve) hours.   famotidine 20 MG tablet Commonly known as: PEPCID Take 1 tablet (20 mg total) by mouth 2 (two) times daily as needed for heartburn or indigestion.   FLUoxetine 40 MG capsule Commonly known as: PROZAC TAKE 1 CAPSULE(40 MG) BY MOUTH DAILY What changed: See the new instructions.   guaiFENesin 100 MG/5ML Soln Commonly known as: ROBITUSSIN Take 10 mLs (200 mg total) by mouth every 4 (four) hours as needed for cough or to loosen phlegm.   lidocaine 5 % Commonly known as: LIDODERM Place 1 patch onto the skin daily. Remove & Discard patch within 12 hours or as directed by MD Start taking on: December 01, 2020   MELATONIN PO Take 10 mg by mouth at bedtime.   omeprazole 40 MG capsule Commonly known as: PRILOSEC TAKE 1 CAPSULE(40 MG) BY MOUTH DAILY What changed: See the new instructions.   saccharomyces boulardii 250 MG capsule Commonly known as: FLORASTOR Take 1 capsule (250 mg total) by mouth 2 (two) times daily.   traZODone 50 MG tablet Commonly known as: DESYREL Take 1 tablet (50 mg total) by mouth at bedtime as needed for sleep.   Vitamin D 125 MCG (5000 UT) Caps Take 5,000 Units by mouth daily.            Durable Medical Equipment  (From admission, onward)         Start     Ordered   11/28/20 1554  For home use only DME oxygen  Once       Question Answer Comment  Length of Need 12 Months   Mode or (Route) Nasal cannula   Liters per Minute 4   Frequency Continuous (stationary and portable oxygen unit needed)   Oxygen delivery system Gas      11/28/20 1554   11/28/20 1519  For home use  only DME 4 wheeled rolling walker with seat  Once       Question:  Patient needs a walker  to treat with the following condition  Answer:  Weakness   11/28/20 1518   11/28/20 1519  For home use only DME 3 n 1  Once        11/28/20 1518          Follow-up Information    [facility] Follow up.   Specialty: Rehabilitation Contact information: [address] [id number] [place] [address] [place], [place], FNP Follow up.   Specialty: Family Medicine Why: follow up with PCP to monitor O2 saturation and repeat CXR to ensure the pleural fluid has not increased in size Contact information: [place] [address] [place] [address] [phone]                Time coordinating discharge: 35 min  Signed:  [facility] 11/30/2020, 4:27 PM

## 2020-12-01 ENCOUNTER — Telehealth: Payer: Self-pay | Admitting: Pulmonary Disease

## 2020-12-01 DIAGNOSIS — C3490 Malignant neoplasm of unspecified part of unspecified bronchus or lung: Secondary | ICD-10-CM

## 2020-12-01 DIAGNOSIS — J910 Malignant pleural effusion: Secondary | ICD-10-CM

## 2020-12-01 NOTE — Telephone Encounter (Signed)
PCCM:  Pleural fluid with NSCLC staining favors [place].   I will set up for follow up with me in clinic, next available.  Referral placed to medical oncology.  I tried calling the patient but there is no answer.  I will try again later.   [facility] 12/01/2020 4:37 PM

## 2020-12-04 ENCOUNTER — Telehealth: Payer: Self-pay | Admitting: *Deleted

## 2020-12-04 NOTE — Telephone Encounter (Signed)
I received a referral from Dr. Valeta Harms that patient needs to be seen by medical oncology.  I called and spoke with her.  I updated her that I received referral.  She states she was unaware of referral and would like to talk to Dr. Valeta Harms. I told her I would update him to call.  She does not want to schedule an appt with med onc at this time.

## 2020-12-07 ENCOUNTER — Telehealth: Payer: Self-pay | Admitting: Pulmonary Disease

## 2020-12-07 NOTE — Telephone Encounter (Signed)
PCCM:  I called and attempted to speak with the patient via phone again.  There was no answer.  I also called the patient's granddaughter that I have met on the day of admission to the ICU.  There was no answer at the phone number provided.  We will attempt to try to get a hold of her again.  Otherwise patient is scheduled for an appointment to see me on the 14th of next week.  We will discuss her pathology results from the pleural fluid at that time.  Pleural Effusion:  FINAL MICROSCOPIC DIAGNOSIS:  - Malignant cells consistent with non-small cell carcinoma   It would be very important for the patient to show up to the appointment on Monday to ensure that we have appropriate next steps to address her malignant pleural effusion.  [facility] 12/07/2020 1:48 PM

## 2020-12-08 ENCOUNTER — Other Ambulatory Visit: Payer: Self-pay

## 2020-12-08 ENCOUNTER — Ambulatory Visit (HOSPITAL_COMMUNITY)
Admission: RE | Admit: 2020-12-08 | Discharge: 2020-12-08 | Disposition: A | Discharge status: Home / Self Care | Payer: Medicare Other | Source: Ambulatory Visit | Attending: Pulmonary Disease | Admitting: Pulmonary Disease

## 2020-12-08 ENCOUNTER — Telehealth: Payer: Self-pay | Admitting: Pulmonary Disease

## 2020-12-08 DIAGNOSIS — J910 Malignant pleural effusion: Secondary | ICD-10-CM | POA: Diagnosis not present

## 2020-12-08 DIAGNOSIS — C3490 Malignant neoplasm of unspecified part of unspecified bronchus or lung: Secondary | ICD-10-CM | POA: Insufficient documentation

## 2020-12-08 DIAGNOSIS — C786 Secondary malignant neoplasm of retroperitoneum and peritoneum: Secondary | ICD-10-CM | POA: Diagnosis not present

## 2020-12-08 LAB — GLUCOSE, CAPILLARY: Glucose-Capillary: 116 mg/dL — ABNORMAL HIGH (ref 70–99)

## 2020-12-08 MED ORDER — FLUDEOXYGLUCOSE F - 18 (FDG) INJECTION
11.0000 | Freq: Once | INTRAVENOUS | Status: AC | PRN
  Administered 2020-12-08: 10.6 via INTRAVENOUS

## 2020-12-08 NOTE — Telephone Encounter (Signed)
Abnormal PET scan will route message to Dr. Valeta Harms to review . Has ov with patient on 2/14 to discuss recent cytology  And now abnormal PET scan

## 2020-12-08 NOTE — Telephone Encounter (Signed)
Thanks, I will discuss in the office on Monday.   Garner Nash, DO [facility] 12/08/2020 3:51 PM

## 2020-12-08 NOTE — Telephone Encounter (Signed)
Received call report on this pt's PET from 12/08/20   IMPRESSION: 1. Unusual pattern of hypermetabolic metastatic disease. 2. Intense metabolic activity associated with pleural thickening in the RIGHT hemithorax consistent with pleural metastasis versus primary pleural malignancy. 3. Isolated LEFT internal mammary nodal metastasis. 4. Intensely hypermetabolic lesion within the liver beneath the hypermetabolic lesions within the diaphragm. Differential includes local extension of pleural metastasis, hematogenous liver metastasis versus hepatic abscess. Favor hematogenous liver metastasis. 5. Hypermetabolic retroperitoneal metastatic adenopathy and internal iliac adenopathy. 6. Hypermetabolic thickening within the LEFT rectus muscle consistent with muscular skeletal metastasis. 7. Intense metabolic activity within enlarged uterus. Recommend gyn evaluation and potential MRI of the uterus to evaluate for primary uterine carcinoma. Unusual site of metastasis. Benign leiomyoma can be hypermetabolic but typically not this intense. 8. Rim of hypermetabolic activity in the vagina towards the introitus. Recommend evaluation as above.   Electronically Signed   By: Suzy Bouchard M.D.   On: 12/08/2020 14:55

## 2020-12-11 ENCOUNTER — Other Ambulatory Visit: Payer: Self-pay

## 2020-12-11 ENCOUNTER — Ambulatory Visit (INDEPENDENT_AMBULATORY_CARE_PROVIDER_SITE_OTHER): Payer: Medicare Other | Admitting: Pulmonary Disease

## 2020-12-11 VITALS — BP 138/84 | HR 95 | Wt 221.0 lb

## 2020-12-11 DIAGNOSIS — R942 Abnormal results of pulmonary function studies: Secondary | ICD-10-CM

## 2020-12-11 DIAGNOSIS — N852 Hypertrophy of uterus: Secondary | ICD-10-CM

## 2020-12-11 DIAGNOSIS — J910 Malignant pleural effusion: Secondary | ICD-10-CM

## 2020-12-11 DIAGNOSIS — J9611 Chronic respiratory failure with hypoxia: Secondary | ICD-10-CM

## 2020-12-11 DIAGNOSIS — C3490 Malignant neoplasm of unspecified part of unspecified bronchus or lung: Secondary | ICD-10-CM

## 2020-12-11 DIAGNOSIS — R948 Abnormal results of function studies of other organs and systems: Secondary | ICD-10-CM

## 2020-12-11 NOTE — Patient Instructions (Addendum)
Thank you for visiting Dr. Valeta Harms at [facility]. Today we recommend the following:  Orders Placed This Encounter  Procedures  . Ambulatory referral to Oncology  . Ambulatory referral to Gynecology   Return if symptoms worsen or fail to improve, for with APP or Dr. Valeta Harms.    Please do your part to reduce the spread of COVID-19.

## 2020-12-11 NOTE — Progress Notes (Signed)
Synopsis: Referred in February 2022 for abnormal PET scan, follow-up from the hospital, malignant pleural effusion by [name], FNP  Subjective:   PATIENT ID: Yvette Roberts GENDER: female DOB: 10-01-1956, MRN: [id number]  Chief Complaint  Patient presents with  . Follow-up    Pt stated that she is still sore on the right side.  She does not feel like the fluid has come back.  Oxygen levels have been good at home with 3 liters of oxygen.    This is a 65 year old female, past medical history of hypertension obesity and diabetes.  Patient initially presented to the hospital on 11/23/2020 and respiratory failure.  Patient was found to have a very large right-sided pleural effusion with mediastinal shift.  Patient underwent pigtail catheter placement after she was intubated for respiratory failure in the ICU.  Patient cytopathology from the pleural fluid returned positive for non-small cell malignancy it was TTF-1 negative, morphology however suggestive of adenocarcinoma but it was CK7 and p63, CK 5/6+.  This is all new information for the patient today in the office.  I reviewed her images in detail.  Prior to visit she had nuclear medicine pet imaging on 12/08/2020.  This nuclear medicine pet imaging revealed hypermetabolic uptake within the pleura and right hemithorax concerning for metastasis.  She also has a left internal mammary node that is hypermetabolic as well as hypermetabolic lesions within the liver and below the liver diaphragm.  She also has retroperitoneal adenopathy and iliac adenopathy as well as intense hypermetabolic activity with an enlarged uterus.  Patient here today to review imaging and pleural fluid cytology   Past Medical History:  Diagnosis Date  . Allergy   . Arthritis   . Depression   . Esophageal reflux 07/21/2014  . Frozen shoulder 05/10/2015   Injected 05/10/2015   . Obesity, unspecified 07/21/2014  . Resistant hypertension 07/21/2014  . Type 2 diabetes  mellitus without complication ([place]) 01/16/253     Family History  Problem Relation Age of Onset  . Breast cancer Sister        ? age of onset     Past Surgical History:  Procedure Laterality Date  . LAPAROSCOPIC ABDOMINAL EXPLORATION    . miscarr      Social History   Socioeconomic History  . Marital status: Divorced    Spouse name: Not on file  . Number of children: Not on file  . Years of education: Not on file  . Highest education level: Not on file  Occupational History  . Not on file  Tobacco Use  . Smoking status: Never Smoker  . Smokeless tobacco: Never Used  Substance and Sexual Activity  . Alcohol use: Not on file  . Drug use: Not on file  . Sexual activity: Not on file  Other Topics Concern  . 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 Eggs Or Egg-Derived Products Hives  . Influenza Vaccines Hives  . Peanut-Containing Drug Products Hives  . Penicillins Hives  . Sulfa Antibiotics Hives  . Codeine Rash     Outpatient Medications Prior to Visit  Medication Sig Dispense Refill  . Accu-Chek FastClix Lancets MISC Use as directed 100 each 3  . amLODipine (NORVASC) 5 MG tablet Take 5 mg by mouth daily.    Marland Kitchen aspirin 81 MG tablet Take 81 mg by mouth daily.    . Cholecalciferol (VITAMIN D) 125 MCG (5000 UT) CAPS Take 5,000 Units by mouth daily.    Marland Kitchen doxycycline (VIBRA-TABS) 100 MG tablet Take 1 tablet (100 mg total) by mouth every 12 (twelve) hours. 8 tablet 0  . famotidine  (PEPCID) 20 MG tablet Take 1 tablet (20 mg total) by mouth 2 (two) times daily as needed for heartburn or indigestion. 30 tablet 0  . FLUoxetine (PROZAC) 40 MG capsule TAKE 1 CAPSULE(40 MG) BY MOUTH DAILY (Patient taking differently: Take 40 mg by mouth daily.) 15 capsule 0  . glucose blood (ACCU-CHEK GUIDE) test strip Use as directed once a day 100 each 3  . guaiFENesin (ROBITUSSIN) 100 MG/5ML SOLN Take 10 mLs (200 mg total) by mouth every 4 (four) hours as needed for cough or to loosen phlegm. 236 mL 0  . lidocaine (LIDODERM) 5 % Place 1 patch onto the skin daily. Remove & Discard patch within 12 hours or as directed by MD 30 patch 0  . MELATONIN PO Take 10 mg by mouth at bedtime.    Marland Kitchen omeprazole (PRILOSEC) 40 MG capsule TAKE 1 CAPSULE(40 MG) BY MOUTH DAILY (Patient taking differently: Take 40 mg by mouth daily.) 30 capsule 0  . saccharomyces boulardii (FLORASTOR) 250 MG capsule Take 1 capsule (250 mg total) by mouth 2 (two) times daily. 30 capsule 0  . traZODone (DESYREL) 50 MG tablet Take 1 tablet (50 mg total) by mouth at bedtime as needed for sleep. 30 tablet 0   No facility-administered medications prior to visit.    Review of Systems  Constitutional: Positive for malaise/fatigue. Negative for chills, fever and weight loss.  HENT: Negative for hearing loss, sore throat and tinnitus.   Eyes: Negative for blurred vision and double vision.  Respiratory: Positive for shortness of breath. Negative for cough, hemoptysis, sputum production, wheezing and stridor.   Cardiovascular: Negative for chest pain, palpitations, orthopnea, leg swelling and PND.  Gastrointestinal: Negative for abdominal pain, constipation, diarrhea, heartburn, nausea and vomiting.  Genitourinary: Negative for dysuria, hematuria and urgency.  Musculoskeletal: Negative for joint pain and myalgias.  Skin: Negative for itching and rash.  Neurological: Negative for dizziness, tingling, weakness and headaches.   Endo/Heme/Allergies: Negative for environmental allergies. Does not bruise/bleed easily.  Psychiatric/Behavioral: Negative for depression. The patient is not nervous/anxious and does not have insomnia.   All other systems reviewed and are negative.    Objective:  Physical Exam Vitals reviewed.  Constitutional:      General: She is not in acute distress.    Appearance: She is well-developed and well-nourished. She is obese.  HENT:     Head: Normocephalic and atraumatic.     Mouth/Throat:     Mouth: Oropharynx is clear and moist.  Eyes:     General: No scleral icterus.    Conjunctiva/sclera: Conjunctivae normal.     Pupils: Pupils are equal, round, and reactive to light.  Neck:     Vascular: No JVD.     Trachea:  No tracheal deviation.  Cardiovascular:     Rate and Rhythm: Normal rate and regular rhythm.     Pulses: Intact distal pulses.     Heart sounds: Normal heart sounds. No murmur heard.   Pulmonary:     Effort: Pulmonary effort is normal. No tachypnea, accessory muscle usage or respiratory distress.     Breath sounds: Normal breath sounds. No stridor. No wheezing, rhonchi or rales.  Abdominal:     General: Bowel sounds are normal. There is no distension.     Palpations: Abdomen is soft.     Tenderness: There is no abdominal tenderness.  Genitourinary:    Comments: Deferred here in pulmonary office Musculoskeletal:        General: No tenderness or edema.     Cervical back: Neck supple.  Lymphadenopathy:     Cervical: No cervical adenopathy.  Skin:    General: Skin is warm and dry.     Capillary Refill: Capillary refill takes less than 2 seconds.     Findings: No rash.  Neurological:     Mental Status: She is alert and oriented to person, place, and time.  Psychiatric:        Mood and Affect: Mood and affect normal.        Behavior: Behavior normal.      Vitals:   12/11/20 1632  BP: 138/84  Pulse: 95  SpO2: 97%  Weight: 221 lb (100.2 kg)   97% on   RA BMI Readings from Last 3 Encounters:  12/11/20 35.67 kg/m  11/24/20 34.52 kg/m  10/12/19 37.93 kg/m   Wt Readings from Last 3 Encounters:  12/11/20 221 lb (100.2 kg)  11/24/20 213 lb 13.5 oz (97 kg)  10/12/19 235 lb (106.6 kg)     CBC    Component Value Date/Time   WBC 8.1 11/28/2020 0024   RBC 3.79 (L) 11/28/2020 0024   HGB 10.4 (L) 11/28/2020 0024   HCT 31.3 (L) 11/28/2020 0024   PLT 287 11/28/2020 0024   MCV 82.6 11/28/2020 0024   MCH 27.4 11/28/2020 0024   MCHC 33.2 11/28/2020 0024   RDW 15.5 11/28/2020 0024   LYMPHSABS 4.2 (H) 11/23/2020 0324   MONOABS 1.4 (H) 11/23/2020 0324   EOSABS 0.0 11/23/2020 0324   BASOSABS 0.0 11/23/2020 0324    Chest Imaging: Nuclear medicine CT chest: Patient with a hypermetabolic disease in an unusual pattern.  Has pleural thickening concerning for pleural malignancy, adenopathy within the internal mammary space.  Retroperitoneal adenopathy and a hypermetabolic uterus. The patient's images have been independently reviewed by me.    Pulmonary Functions Testing Results: No flowsheet data found.  FeNO:   Pathology:   FINAL MICROSCOPIC DIAGNOSIS:  - Malignant cells consistent with non-small cell carcinoma  - See comment   SPECIMEN ADEQUACY:  Satisfactory for evaluation   DIAGNOSTIC COMMENTS:  Morphologically, the malignant cells are suggestive of an  adenocarcinoma. However, immunohistochemical stains show that the tumor  cells are positive for CK7 with patchy labeling for p63 and CK 5/6  whereas they are negative for TTF-1, Napsin-A, CK20, calretinin, D2-40,  GATA3, ER and CDX2. Ki-67 stain shows increased proliferative index.  Positive staining for CK5/6 and p63 is suggestive of elements of  squamous differentiation.  Echocardiogram:  Heart Catheterization:     Assessment & Plan:     ICD-10-CM   1. Malignant pleural effusion  J91.0 Ambulatory referral to Oncology    Ambulatory referral to Gynecology  2.  Abnormal PET scan,  retroperitoneal  R94.8   3. Abnormal PET scan, lung  R94.2   4. Chronic hypoxemic respiratory failure (HCC)  J96.11   5. Non-small cell cancer with metastasis ([place])  C34.90    unknown primary   6. Enlarged uterus  N85.2     Discussion:  This is a 65 year old female newly diagnosed malignant pleural effusion.  She has metastatic disease based on nuclear medicine pet imaging of non-small cell cancer with unknown primary.  Additional time spent today in the office reviewing CT imaging and pathology results from patient's recent hospitalization.  Plan: Continue current oxygen supplementation to maintain O2 sats above 88%. I suspect this is related to her hospitalization and malignant effusion. Referral to oncology Referral to GYN for evaluation of the uterus If patient's malignant effusion returns we will be happy to drain effusion in the office or in the future if needed consideration for indwelling pleural catheter placement.   Current Outpatient Medications:  .  Accu-Chek FastClix Lancets MISC, Use as directed, Disp: 100 each, Rfl: 3 .  amLODipine (NORVASC) 5 MG tablet, Take 5 mg by mouth daily., Disp: , Rfl:  .  aspirin 81 MG tablet, Take 81 mg by mouth daily., Disp: , Rfl:  .  Cholecalciferol (VITAMIN D) 125 MCG (5000 UT) CAPS, Take 5,000 Units by mouth daily., Disp: , Rfl:  .  doxycycline (VIBRA-TABS) 100 MG tablet, Take 1 tablet (100 mg total) by mouth every 12 (twelve) hours., Disp: 8 tablet, Rfl: 0 .  famotidine (PEPCID) 20 MG tablet, Take 1 tablet (20 mg total) by mouth 2 (two) times daily as needed for heartburn or indigestion., Disp: 30 tablet, Rfl: 0 .  FLUoxetine (PROZAC) 40 MG capsule, TAKE 1 CAPSULE(40 MG) BY MOUTH DAILY (Patient taking differently: Take 40 mg by mouth daily.), Disp: 15 capsule, Rfl: 0 .  glucose blood (ACCU-CHEK GUIDE) test strip, Use as directed once a day, Disp: 100 each, Rfl: 3 .  guaiFENesin (ROBITUSSIN) 100 MG/5ML SOLN, Take 10 mLs  (200 mg total) by mouth every 4 (four) hours as needed for cough or to loosen phlegm., Disp: 236 mL, Rfl: 0 .  lidocaine (LIDODERM) 5 %, Place 1 patch onto the skin daily. Remove & Discard patch within 12 hours or as directed by MD, Disp: 30 patch, Rfl: 0 .  MELATONIN PO, Take 10 mg by mouth at bedtime., Disp: , Rfl:  .  omeprazole (PRILOSEC) 40 MG capsule, TAKE 1 CAPSULE(40 MG) BY MOUTH DAILY (Patient taking differently: Take 40 mg by mouth daily.), Disp: 30 capsule, Rfl: 0 .  saccharomyces boulardii (FLORASTOR) 250 MG capsule, Take 1 capsule (250 mg total) by mouth 2 (two) times daily., Disp: 30 capsule, Rfl: 0 .  traZODone (DESYREL) 50 MG tablet, Take 1 tablet (50 mg total) by mouth at bedtime as needed for sleep., Disp: 30 tablet, Rfl: 0  I spent 40 minutes dedicated to the care of this patient on the date of this encounter to include pre-visit review of records, face-to-face time with the patient discussing conditions above, post visit ordering of testing, clinical documentation with the electronic health record, making appropriate referrals as documented, and communicating necessary findings to members of the patients care team.   Garner Nash, DO [facility] 12/11/2020 5:06 PM

## 2020-12-12 ENCOUNTER — Ambulatory Visit (HOSPITAL_COMMUNITY): Payer: Medicare Other

## 2020-12-14 ENCOUNTER — Encounter: Payer: Self-pay | Admitting: *Deleted

## 2020-12-14 DIAGNOSIS — R918 Other nonspecific abnormal finding of lung field: Secondary | ICD-10-CM

## 2020-12-14 NOTE — Progress Notes (Signed)
I received referral on Yvette Roberts. I called and scheduled her to be seen with med onc and GYN oncology.  I gave her appt time, dates, and location. She verbalized understanding of appts.

## 2020-12-18 ENCOUNTER — Ambulatory Visit: Payer: Medicare Other | Admitting: Gynecologic Oncology

## 2020-12-20 ENCOUNTER — Inpatient Hospital Stay: Payer: Medicare Other | Attending: Internal Medicine | Admitting: Internal Medicine

## 2020-12-20 ENCOUNTER — Other Ambulatory Visit: Payer: Self-pay

## 2020-12-20 ENCOUNTER — Inpatient Hospital Stay: Payer: Medicare Other

## 2020-12-20 ENCOUNTER — Telehealth: Payer: Self-pay | Admitting: Internal Medicine

## 2020-12-20 ENCOUNTER — Encounter: Payer: Self-pay | Admitting: *Deleted

## 2020-12-20 VITALS — BP 140/84 | HR 81 | Temp 98.2°F | Resp 16 | Ht 66.0 in | Wt 223.0 lb

## 2020-12-20 DIAGNOSIS — J910 Malignant pleural effusion: Secondary | ICD-10-CM | POA: Insufficient documentation

## 2020-12-20 DIAGNOSIS — I10 Essential (primary) hypertension: Secondary | ICD-10-CM

## 2020-12-20 DIAGNOSIS — K219 Gastro-esophageal reflux disease without esophagitis: Secondary | ICD-10-CM | POA: Diagnosis not present

## 2020-12-20 DIAGNOSIS — C801 Malignant (primary) neoplasm, unspecified: Secondary | ICD-10-CM

## 2020-12-20 DIAGNOSIS — Z803 Family history of malignant neoplasm of breast: Secondary | ICD-10-CM | POA: Diagnosis not present

## 2020-12-20 DIAGNOSIS — R918 Other nonspecific abnormal finding of lung field: Secondary | ICD-10-CM

## 2020-12-20 DIAGNOSIS — C772 Secondary and unspecified malignant neoplasm of intra-abdominal lymph nodes: Secondary | ICD-10-CM | POA: Insufficient documentation

## 2020-12-20 DIAGNOSIS — J90 Pleural effusion, not elsewhere classified: Secondary | ICD-10-CM

## 2020-12-20 DIAGNOSIS — F32A Depression, unspecified: Secondary | ICD-10-CM | POA: Insufficient documentation

## 2020-12-20 DIAGNOSIS — C799 Secondary malignant neoplasm of unspecified site: Secondary | ICD-10-CM

## 2020-12-20 DIAGNOSIS — K769 Liver disease, unspecified: Secondary | ICD-10-CM | POA: Insufficient documentation

## 2020-12-20 DIAGNOSIS — C55 Malignant neoplasm of uterus, part unspecified: Secondary | ICD-10-CM | POA: Insufficient documentation

## 2020-12-20 DIAGNOSIS — I1A0 Resistant hypertension: Secondary | ICD-10-CM

## 2020-12-20 LAB — CMP (CANCER CENTER ONLY)
ALT: 19 U/L (ref 0–44)
AST: 25 U/L (ref 15–41)
Albumin: 3.1 g/dL — ABNORMAL LOW (ref 3.5–5.0)
Alkaline Phosphatase: 67 U/L (ref 38–126)
Anion gap: 10 (ref 5–15)
BUN: 6 mg/dL — ABNORMAL LOW (ref 8–23)
CO2: 29 mmol/L (ref 22–32)
Calcium: 9.1 mg/dL (ref 8.9–10.3)
Chloride: 100 mmol/L (ref 98–111)
Creatinine: 0.64 mg/dL (ref 0.44–1.00)
GFR, Estimated: >60 mL/min (ref >60)
Glucose, Bld: 108 mg/dL — ABNORMAL HIGH (ref 70–99)
Potassium: 3.1 mmol/L — ABNORMAL LOW (ref 3.5–5.1)
Sodium: 139 mmol/L (ref 135–145)
Total Bilirubin: 0.6 mg/dL (ref 0.3–1.2)
Total Protein: 7.4 g/dL (ref 6.5–8.1)

## 2020-12-20 LAB — CBC WITH DIFFERENTIAL (CANCER CENTER ONLY)
Abs Immature Granulocytes: 0.01 10*3/uL (ref 0.00–0.07)
Basophils Absolute: 0 10*3/uL (ref 0.0–0.1)
Basophils Relative: 1 %
Eosinophils Absolute: 0 10*3/uL (ref 0.0–0.5)
Eosinophils Relative: 1 %
HCT: 37.9 % (ref 36.0–46.0)
Hemoglobin: 12.2 g/dL (ref 12.0–15.0)
Immature Granulocytes: 0 %
Lymphocytes Relative: 27 %
Lymphs Abs: 1.5 10*3/uL (ref 0.7–4.0)
MCH: 27.1 pg (ref 26.0–34.0)
MCHC: 32.2 g/dL (ref 30.0–36.0)
MCV: 84.2 fL (ref 80.0–100.0)
Monocytes Absolute: 0.7 10*3/uL (ref 0.1–1.0)
Monocytes Relative: 11 %
Neutro Abs: 3.5 10*3/uL (ref 1.7–7.7)
Neutrophils Relative %: 60 %
Platelet Count: 328 10*3/uL (ref 150–400)
RBC: 4.5 MIL/uL (ref 3.87–5.11)
RDW: 16.7 % — ABNORMAL HIGH (ref 11.5–15.5)
WBC Count: 5.8 10*3/uL (ref 4.0–10.5)
nRBC: 0 % (ref 0.0–0.2)

## 2020-12-20 NOTE — Telephone Encounter (Signed)
Scheduled follow-up appointment per 2/23 los. Patient is aware.

## 2020-12-20 NOTE — Progress Notes (Signed)
[place] [phone]   [phone]  CONSULT NOTE  REFERRING PHYSICIAN: Dr. Leory Plowman Icard  REASON FOR CONSULTATION:  65 years old African-American female with metastatic adenocarcinoma of unknown primary  HPI Yvette Roberts is a 65 y.o. female with past medical history significant for allergy, depression, GERD, hypertension. The patient has no history for smoking. She presented to the emergency department on November 23, 2020 complaining of shortness of breath for 2 weeks as well as confusion. She had chest x-ray on November 23, 2020 and it showed complete opacification of the right hemithorax with focal infiltration in the left lung base behind the heart. This was followed by CT scan of the chest on the same day and it showed large right pleural effusion with near complete collapse of the right lung and mediastinal shift to the left. The patient was seen by Dr. Valeta Harms and Dr. Ina Homes and she had chest tube placed for drainage of the right pleural effusion. The fluid was sent for cytology evaluation and the final cytology ([id number]) showed malignant cells consistent with non-small cell carcinoma. Morphologically, the malignant cells are suggestive of an adenocarcinoma. However, immunohistochemical stains show that the tumor cells are positive for CK7 with patchy labeling for p63 and CK 5/6 whereas they are negative for TTF-1, Napsin-A, CK20, calretinin, D2-40, GATA3, ER and CDX2. Ki-67 stain shows increased proliferative index. Positive staining for CK5/6 and p63 is suggestive of elements of  squamous differentiation. Repeat CT scan of the chest after the drainage by the chest tube showed near complete evacuation of the right pleural effusion with trace residual effusion that is partially loculated. There was dense consolidation throughout the right lung greatest at the right lung base and the finding consistent with a combination of pneumonia and  atelectasis. There was indeterminate hypodensity in the right lobe of the liver. The chest tube was removed and a PET scan was performed on September 07, 2021 and it showed unusual pattern of hypermetabolic metastatic disease with intense metabolic activity associated with pleural thickening in the right hemithorax consistent with pleural metastases versus primary pleural malignancy. There was isolated left internal mammary nodal metastasis and a intensely hypermetabolic lesion within the liver beneath the hypermetabolic lesions within the diaphragm. There was also hypermetabolic retroperitoneal metastatic adenopathy and internal iliac adenopathy in addition to hypermetabolic thickening within the left rectus muscle consistent with muscular skeletal metastasis and intense hypermetabolic activity within the enlarged uterus suspicious for primary uterine carcinoma there was also a rim of hypermetabolic activity in the vagina towards the introitus. The patient was referred to me today for evaluation and recommendation regarding her condition. She was also referred to Dr. Berline Lopes with gynecologic oncology for evaluation of her condition. When seen today she continues to complain of heartburn as well as shortness of breath with exertion but no significant chest pain, cough or hemoptysis. She has occasional diarrhea. She denied having any nausea, vomiting or constipation. She denied having any headache or visual changes. She lost few pounds recently. Family history significant for a sister who died from breast cancer in her 17s. Mother had dementia and father had unknown medical history. The patient is a widow and has 1 son. Her son lives in [place]. She was accompanied today by her sister-in-law Yvette Roberts. 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 She has no history of smoking, alcohol or drug abuse.  HPI  Past Medical History:  Diagnosis  Date  . Allergy   . Arthritis   .  Depression   . Esophageal reflux 07/21/2014  . Frozen shoulder 05/10/2015   Injected 05/10/2015   . Obesity, unspecified 07/21/2014  . Resistant hypertension 07/21/2014  . Type 2 diabetes mellitus without complication ([place]) 1/93/7902    Past Surgical History:  Procedure Laterality Date  . LAPAROSCOPIC ABDOMINAL EXPLORATION    . miscarr      Family History  Problem Relation Age of Onset  . Breast cancer Sister        ? age of onset    Social History Social History   Tobacco Use  . Smoking status: Never Smoker  . Smokeless tobacco: Never Used    Allergies  Allergen Reactions  . Eggs Or Egg-Derived Products Hives  . Influenza Vaccines Hives  . Peanut-Containing Drug Products Hives  . Penicillins Hives  . Sulfa Antibiotics Hives  . Codeine Rash    Current Outpatient Medications  Medication Sig Dispense Refill  . Accu-Chek FastClix Lancets MISC Use as directed 100 each 3  . amLODipine (NORVASC) 5 MG tablet Take 5 mg by mouth daily.    Marland Kitchen aspirin 81 MG tablet Take 81 mg by mouth daily.    . Cholecalciferol (VITAMIN D) 125 MCG (5000 UT) CAPS Take 5,000 Units by mouth daily.    Marland Kitchen doxycycline (VIBRA-TABS) 100 MG tablet Take 1 tablet (100 mg total) by mouth every 12 (twelve) hours. 8 tablet 0  . famotidine (PEPCID) 20 MG tablet Take 1 tablet (20 mg total) by mouth 2 (two) times daily as needed for heartburn or indigestion. 30 tablet 0  . FLUoxetine (PROZAC) 40 MG capsule TAKE 1 CAPSULE(40 MG) BY MOUTH DAILY (Patient taking differently: Take 40 mg by mouth daily.) 15 capsule 0  . glucose blood (ACCU-CHEK GUIDE) test strip Use as directed once a day 100 each 3  . guaiFENesin (ROBITUSSIN) 100 MG/5ML SOLN Take 10 mLs (200 mg total) by mouth every 4 (four) hours as needed for cough or to loosen phlegm. 236 mL 0  . lidocaine (LIDODERM) 5 % Place 1 patch onto the skin daily. Remove & Discard patch within 12 hours or as directed by MD 30 patch 0  . MELATONIN PO Take 10 mg by mouth at  bedtime.    Marland Kitchen omeprazole (PRILOSEC) 40 MG capsule TAKE 1 CAPSULE(40 MG) BY MOUTH DAILY (Patient taking differently: Take 40 mg by mouth daily.) 30 capsule 0  . saccharomyces boulardii (FLORASTOR) 250 MG capsule Take 1 capsule (250 mg total) by mouth 2 (two) times daily. 30 capsule 0  . traZODone (DESYREL) 50 MG tablet Take 1 tablet (50 mg total) by mouth at bedtime as needed for sleep. 30 tablet 0   No current facility-administered medications for this visit.    Review of Systems  Constitutional: positive for fatigue Eyes: negative Ears, nose, mouth, throat, and face: negative Respiratory: positive for dyspnea on exertion Cardiovascular: negative Gastrointestinal: positive for diarrhea and dyspepsia Genitourinary:negative Integument/breast: negative Hematologic/lymphatic: negative Musculoskeletal:positive for muscle weakness Neurological: negative Behavioral/Psych: negative Endocrine: negative Allergic/Immunologic: negative  Physical Exam  IOX:BDZHG, healthy, no distress, well nourished, well developed and anxious SKIN: skin color, texture, turgor are normal, no rashes or significant lesions HEAD: Normocephalic, No masses, lesions, tenderness or abnormalities EYES: normal, PERRLA, Conjunctiva are pink and non-injected EARS: External ears normal, Canals clear OROPHARYNX:no exudate, no erythema and lips, buccal mucosa, and tongue normal  NECK: supple, no adenopathy, no JVD LYMPH:  no palpable lymphadenopathy, no hepatosplenomegaly  BREAST:not examined LUNGS: clear to auscultation , and palpation HEART: regular rate & rhythm, no murmurs and no gallops ABDOMEN:abdomen soft, non-tender, normal bowel sounds and no masses or organomegaly BACK: No CVA tenderness, Range of motion is normal EXTREMITIES:no joint deformities, effusion, or inflammation, no edema  NEURO: alert & oriented x 3 with fluent speech, no focal motor/sensory deficits  PERFORMANCE STATUS: ECOG 1  LABORATORY  DATA: Lab Results  Component Value Date   WBC 5.8 12/20/2020   HGB 12.2 12/20/2020   HCT 37.9 12/20/2020   MCV 84.2 12/20/2020   PLT 328 12/20/2020      Chemistry      Component Value Date/Time   NA 139 12/20/2020 1346   K 3.1 (L) 12/20/2020 1346   CL 100 12/20/2020 1346   CO2 29 12/20/2020 1346   BUN 6 (L) 12/20/2020 1346   CREATININE 0.64 12/20/2020 1346      Component Value Date/Time   CALCIUM 9.1 12/20/2020 1346   ALKPHOS 67 12/20/2020 1346   AST 25 12/20/2020 1346   ALT 19 12/20/2020 1346   BILITOT 0.6 12/20/2020 1346       RADIOGRAPHIC STUDIES: DG Chest 1 View  Result Date: 11/23/2020 CLINICAL DATA:  Chest tube placement on right EXAM: CHEST  1 VIEW COMPARISON:  November 23, 2020 study obtained earlier in the day; chest CT November 23, 2020 FINDINGS: There is now a pigtail catheter at the right base. Endotracheal tube tip is 4.5 cm above the carina. Nasogastric tube tip and side port below the diaphragm. No pneumothorax. There is sizable loculated pleural effusion on the right with areas of superimposed consolidation throughout the right lung. Left lung is clear. Heart size and pulmonary vascularity are normal. No adenopathy. No bone lesions. IMPRESSION: Tube and catheter positions as described. No pneumothorax. Less pleural fluid on the right but with significant persistent apparent partially loculated pleural effusion. Areas of consolidation throughout the right lung as well. There is now aeration of a portion of the medial right mid and lower lung regions compared to earlier in the day. Left lung is clear.  Stable cardiac silhouette. Electronically Signed   By: Lowella Grip III M.D.   On: 11/23/2020 08:07   CT Head Wo Contrast  Result Date: 11/23/2020 CLINICAL DATA:  Altered mental status EXAM: CT HEAD WITHOUT CONTRAST TECHNIQUE: Contiguous axial images were obtained from the base of the skull through the vertex without intravenous contrast. COMPARISON:  None.  FINDINGS: Brain: Normal anatomic configuration. No abnormal intra or extra-axial mass lesion or fluid collection. No abnormal mass effect or midline shift. No evidence of acute intracranial hemorrhage or infarct. Ventricular size is normal. Cerebellum unremarkable. Vascular: Unremarkable Skull: Intact Sinuses/Orbits: Paranasal sinuses are clear. Orbits are unremarkable. Other: Mastoid air cells and middle ear cavities are clear. IMPRESSION: No acute intracranial abnormality.  Normal exam. Electronically Signed   By: Fidela Salisbury MD   On: 11/23/2020 05:31   CT CHEST WO CONTRAST  Result Date: 11/23/2020 CLINICAL DATA:  Right pleural effusion, chest tube placement EXAM: CT CHEST WITHOUT CONTRAST TECHNIQUE: Multidetector CT imaging of the chest was performed following the standard protocol without IV contrast. COMPARISON:  11/23/2020 FINDINGS: Cardiovascular: The heart is unremarkable without pericardial effusion. Unenhanced imaging of the great vessels demonstrates normal caliber of the thoracic aorta. Mediastinum/Nodes: Mediastinal shift seen previously has resolved. Stable borderline enlarged precarinal lymph node measuring up to 9 mm. No pathologic adenopathy. Thyroid, trachea, and esophagus are unremarkable. Lungs/Pleura: There has been near complete  evacuation of the right pleural effusion seen previously, with interval placement of the pigtail drainage catheter in the right posterior costophrenic angle. Trace residual pleural fluid, partially loculated. Small amount of gas within the right hemithorax consistent with indwelling catheter. There is dense consolidation within the right lung, greatest in the medial and lower lobes, compatible with pneumonia. Hypoventilatory changes are seen within the dependent left lower lobe. Upper Abdomen: There is an indeterminate 2.4 cm hypodensity within the right lobe liver, reference image 109/3. The remainder of the upper abdomen is unremarkable. Musculoskeletal: No  acute or destructive bony lesions. Moderate mid to lower thoracic spondylosis. Reconstructed images demonstrate no additional findings. IMPRESSION: 1. Near complete evacuation of the right pleural effusion after right-sided pigtail drainage catheter placement. Trace residual effusion is partially loculated. 2. Dense consolidation throughout the right lung, greatest at the right lung base. Findings are consistent with a combination of pneumonia and atelectasis. Follow-up imaging after completion of appropriate medical management is recommended to exclude underlying neoplasm. 3. Indeterminate hypodensity right lobe liver. If further evaluation is desired, dedicated liver CT or MRI may be useful. Electronically Signed   By: Randa Ngo M.D.   On: 11/23/2020 23:12   CT Chest Wo Contrast  Result Date: 11/23/2020 CLINICAL DATA:  Abnormal chest x-ray, hypoxia EXAM: CT CHEST WITHOUT CONTRAST TECHNIQUE: Multidetector CT imaging of the chest was performed following the standard protocol without IV contrast. COMPARISON:  None. FINDINGS: Cardiovascular: Cardiac size within normal limits. No significant coronary artery calcification. No pericardial effusion. Central pulmonary arteries are of normal caliber. The thoracic aorta is unremarkable. Mediastinum/Nodes: There is mediastinal shift to the left secondary to the large right pleural effusion. Thyroid unremarkable. No pathologic mediastinal adenopathy. Nasogastric tube extends into the abdomen and is a seen looped within the visualized stomach. Lungs/Pleura: The right hemithorax is not fully included on this examination and the inferior pleural space is excluded. However, a large right pleural effusion is present with near complete collapse of the right lung. The pleural fluid is relatively low attenuation suggesting a transudative effusion. No definite central obstructing mass is identified though imaging of the decompressed pulmonary parenchyma is limited in absence  of contrast administration. Endotracheal tube seen within the trachea approximately 2.8 cm above the carina. Mild left basilar atelectasis. No pneumothorax. Upper Abdomen: No acute abnormality. Musculoskeletal: Osseous structures are age-appropriate. No lytic or blastic bone lesions are identified. IMPRESSION: Large right pleural effusion with near complete collapse of the right lung and mediastinal shift to the left. Electronically Signed   By: Fidela Salisbury MD   On: 11/23/2020 05:39   NM PET Image Initial (PI) Skull Base To Thigh  Result Date: 12/08/2020 CLINICAL DATA:  Initial treatment strategy for non-small cell lung cancer. Large RIGHT malignant pleural effusion EXAM: NUCLEAR MEDICINE PET SKULL BASE TO THIGH TECHNIQUE: 10.6 mCi F-18 FDG was injected intravenously. Full-ring PET imaging was performed from the skull base to thigh after the radiotracer. CT data was obtained and used for attenuation correction and anatomic localization. Fasting blood glucose:  116 mg/dl COMPARISON:  CT 11/23/2020 (pre and post thoracentesis) FINDINGS: Mediastinal blood pool activity: SUV max 2.1 Liver activity: SUV max NA NECK: No hypermetabolic lymph nodes in the neck. Incidental CT findings: none CHEST: Hypermetabolic pleural thickening in the inferior medial RIGHT lower lobe adjacent to the cardiac border SUV max equal 10.7 (image 80) Hypermetabolic thickening over the RIGHT hemidiaphragm. Rind of hypermetabolic thickening at the RIGHT lung base extending over the the diaphragm. Activity  anteriorly with SUV max equal 6.9 on image 85. Posteriorly hypermetabolic pleural thickening with SUV max equal 6.9 directly involves the pleura. There is a loculated fluid collection in the RIGHT lower lobe measuring 5.9 by 3.2 cm without significant metabolic activity In lung parenchyma no discrete hypermetabolic pulmonary nodules. No clear hypermetabolic mediastinal lymph nodes. RIGHT lower paratracheal lymph node indeterminate SUV max  equal 3.1. No hypermetabolic a pleural thickening in the LEFT hemithorax. No hypermetabolic pulmonary nodules within LEFT lung. There is a solitary hypermetabolic internal mammary lymph node within the LEFT hemithorax with SUV max equal 4.5 on image 62. Incidental CT findings: none ABDOMEN/PELVIS: Two adjacent low-density lesions in the liver RIGHT hepatic lobe. One lesion measures 3.0 cm with intermediate density (HU equal 31) and more peripheral subcapsular low-density lesion measures 4.0 cm (image 85) with simple fluid attenuation. The intermediate density lesion is intensely hypermetabolic SUV max equal 51.7. More peripheral cystic lesion is not hypermetabolic. Hypermetabolic activity within the crus of the diaphragm. Scattered hypermetabolic activity associated with the bowel is nonspecific. Small lymph nodes in the retroperitoneum superior to the renal veins consistent with metastatic adenopathy. For example lymph node LEFT aorta measuring only 6 mm on image 122 with SUV max equals 6.2. Similar node position between the IVC and aorta measuring 10 mm image 124 with SUV max equal 9.1. Hypermetabolic internal iliac lymph nodes at the level of the operator space. For example LEFT operator space node measuring 6 mm SUV max equal 7.4. There is intense metabolic activity associated with the enlarged uterus. The activity is heterogeneous intense throughout the uterus with SUV max equal 8.6. Uterus measures 7.3 x 9.4 cm in axial dimension has a lobular contour. There is circumferential region of intense metabolic activity in the vagina towards the external genitalia with SUV max equal 7.8 on image 180. Additionally there is focal hypermetabolic thickening of the LEFT rectus muscle with SUV max equal 6.9 on image 155. Incidental CT findings: Free fluid the pelvis. Cyst of the RIGHT ovary. Normal ovarian tissue not well appreciated. SKELETON: No evidence skeletal metastasis. Incidental CT findings: none IMPRESSION: 1.  Unusual pattern of hypermetabolic metastatic disease. 2. Intense metabolic activity associated with pleural thickening in the RIGHT hemithorax consistent with pleural metastasis versus primary pleural malignancy. 3. Isolated LEFT internal mammary nodal metastasis. 4. Intensely hypermetabolic lesion within the liver beneath the hypermetabolic lesions within the diaphragm. Differential includes local extension of pleural metastasis, hematogenous liver metastasis versus hepatic abscess. Favor hematogenous liver metastasis. 5. Hypermetabolic retroperitoneal metastatic adenopathy and internal iliac adenopathy. 6. Hypermetabolic thickening within the LEFT rectus muscle consistent with muscular skeletal metastasis. 7. Intense metabolic activity within enlarged uterus. Recommend gyn evaluation and potential MRI of the uterus to evaluate for primary uterine carcinoma. Unusual site of metastasis. Benign leiomyoma can be hypermetabolic but typically not this intense. 8. Rim of hypermetabolic activity in the vagina towards the introitus. Recommend evaluation as above. Electronically Signed   By: Suzy Bouchard M.D.   On: 12/08/2020 14:55   DG Chest Port 1 View  Result Date: 11/28/2020 CLINICAL DATA:  Chest tube removal. EXAM: PORTABLE CHEST 1 VIEW COMPARISON:  11/28/2020 FINDINGS: Right percutaneous pleural drain has been removed in the interval. There is persistent right base consolidative opacity without evidence for residual pneumothorax. Right pleural thickening is similar to prior. Probable left basilar atelectasis with a small left pleural effusion noted. Cardiopericardial silhouette is at upper limits of normal for size. The visualized bony structures of the thorax show no  acute abnormality. IMPRESSION: Right chest tube removal without evidence for pneumothorax. Electronically Signed   By: Misty Stanley M.D.   On: 11/28/2020 11:38   DG Chest Port 1 View  Result Date: 11/28/2020 CLINICAL DATA:  Pleural effusion.  EXAM: PORTABLE CHEST 1 VIEW COMPARISON:  Chest x-ray 11/27/2020, CT chest 11/23/2020 FINDINGS: The heart size and mediastinal contours are unchanged. Persistent bilateral patchy airspace opacities, right greater than left. Increased patchy airspace opacities within the left upper lung zone. No pulmonary edema. Right chest tube in stable position. Slight interval increase in size of a small right pleural effusion. Interval increase in an at least small left pleural effusion. No pneumothorax. No acute osseous abnormality. IMPRESSION: 1. Slight interval increase in a small right pleural effusion with chest tube in stable position. 2. Interval increase in an at least small left pleural effusion. 3. Interval worsening of bilateral patchy airspace opacities, worsened within the left upper lobe. Electronically Signed   By: Iven Finn M.D.   On: 11/28/2020 05:07   DG Chest Port 1 View  Result Date: 11/27/2020 CLINICAL DATA:  Right pleural effusion. EXAM: PORTABLE CHEST 1 VIEW COMPARISON:  One view chest x-ray 11/26/20 FINDINGS: Heart is enlarged. Right pleural effusion is again noted. No definite pneumothorax is present. Right chest tube remains in place. Right lower lobe airspace disease is similar the prior study. Fluid is present fissure. A smaller left pleural effusion is also similar the prior study with mild left basilar airspace disease. IMPRESSION: 1. Stable bilateral pleural effusions, right greater than left. 2. Bibasilar airspace disease, right greater than left. 3. No pneumothorax. Electronically Signed   By: San Morelle M.D.   On: 11/27/2020 07:57   DG Chest Port 1 View  Result Date: 11/26/2020 CLINICAL DATA:  Right pleural effusion EXAM: PORTABLE CHEST 1 VIEW COMPARISON:  Chest x-ray 11/25/2020, CT chest 11/23/2020 FINDINGS: Right chest tube pigtail overlying the right base unchanged. The heart size and mediastinal contours are unchanged. Right lung airspace disease again noted. Left base  streaky airspace opacity likely representing atelectasis. Redemonstration of small right apical pneumothorax as well as small to moderate right pleural effusion. Suggestion of a trace to small volume left pleural effusion. No pneumothorax on the left. No acute osseous abnormality. IMPRESSION: 1. Stable small right apical pneumothorax and small to moderate right pleural effusion. Unchanged position of a right chest tube pigtail. Persistent underlying right lung airspace disease. Followup PA and lateral chest X-ray is recommended in 3-4 weeks to ensure resolution and exclude underlying malignancy. 2. Persistent trace to small volume left pleural effusion. Underlying atelectasis with superimposed infection/inflammation not excluded. Electronically Signed   By: Iven Finn M.D.   On: 11/26/2020 05:50   DG Chest Port 1 View  Result Date: 11/25/2020 CLINICAL DATA:  Right pleural effusion EXAM: PORTABLE CHEST 1 VIEW COMPARISON:  11/24/2020 FINDINGS: Right basilar pigtail chest tube unchanged. Interval development of right pneumothorax approximately 10 mm. Progression of airspace disease throughout the right lung. Mild to moderate right pleural effusion with mild progression. Mild left lower lobe atelectasis unchanged.  Small left effusion. IMPRESSION: Interval development of right apical pneumothorax approximately 10 mm Progression of right lower lobe airspace disease and right pleural effusion. Electronically Signed   By: Franchot Gallo M.D.   On: 11/25/2020 09:47   DG Chest Port 1 View  Result Date: 11/24/2020 CLINICAL DATA:  Pleural effusion, respiratory failure EXAM: PORTABLE CHEST 1 VIEW COMPARISON:  11/23/2020 FINDINGS: Interval extubation. Pulmonary insufflation has diminished slightly  in the interval. Right basilar pigtail chest tube is again identified. Previously noted large right pleural effusion has been largely evacuated though small pleural fluid persists. Right basilar consolidation is noted.  Additional airspace infiltrate within the a right upper lobe. Mild left basilar atelectasis. No pneumothorax. Cardiac size within normal limits. IMPRESSION: Interval extubation with slight interval decrease in pulmonary insufflation. Right basilar pigtail chest tube placement with near complete evacuation of right pleural effusion. Persistent right basilar consolidation and right upper lobe airspace infiltrate, likely infectious. Electronically Signed   By: Fidela Salisbury MD   On: 11/24/2020 04:19   DG Chest Portable 1 View  Result Date: 11/23/2020 CLINICAL DATA:  Respiratory failure, endotracheal intubation EXAM: PORTABLE CHEST 1 VIEW COMPARISON:  11/23/2020 FINDINGS: Endotracheal tube is seen within the right mainstem bronchus roughly 2 cm beyond the carina. Nasogastric tube is seen looped within the gastric fundus. There is improved aeration of the right lung, likely related to preferential right lung aeration, which is remains largely collapsed. A large right pleural effusion is again seen. Left lung is clear and there is improved insufflation of the left lung. No pneumothorax or pleural effusion on the left. Cardiac size within normal limits. Pulmonary vascularity is normal. IMPRESSION: Right mainstem intubation. Withdrawal of the endotracheal tube by 4.5-5 cm would more optimally position the catheter within the trachea. Large right pleural effusion with near complete collapse of the right lung. These results were called by telephone at the time of interpretation on 11/23/2020 at 3:36 am to provider DAVID [facility] , who verbally acknowledged these results. Electronically Signed   By: Fidela Salisbury MD   On: 11/23/2020 03:36   DG Chest Portable 1 View  Result Date: 11/23/2020 CLINICAL DATA:  Shortness of breath for 2 weeks, worse today. EXAM: PORTABLE CHEST 1 VIEW COMPARISON:  None. FINDINGS: Shallow inspiration. Complete opacification of the right hemithorax. This could be due to either or combination of  consolidation, atelectasis, and/or effusion. Focal infiltration in the left lung base behind the heart. Heart size is obscured by the parenchymal process. No pneumothorax. IMPRESSION: Complete opacification of the right hemithorax. Focal infiltration in the left lung base behind the heart. Electronically Signed   By: Lucienne Capers M.D.   On: 11/23/2020 03:14    ASSESSMENT: This is a very pleasant 65 years old African-American female with metastatic adenocarcinoma of unknown primary likely to be of gynecologic malignancy especially uterine cancer with metastasis to the abdominal lymph nodes, liver and lung in addition to the malignant right pleural effusion diagnosed in January 2022.   PLAN: I had a lengthy discussion with the patient and her sister-in-law today about her current disease stage, prognosis and treatment options. I personally and independently reviewed the scan images and discussed the results with the patient and her sister-in-law. This is likely to be gynecologic malignancy probably from uterine origin but other primary adenocarcinoma could not be excluded at this point. I recommended for the patient to keep her appointment with Dr. Berline Lopes for further evaluation of her possible uterine cancer. In the meantime I send her tissue block to [facility] for molecular studies and PD-L1 expression. If the patient has an actionable mutation, she may benefit from treatment with targeted therapy but if no actionable mutation, she probably will require systemic chemotherapy plus/minus immunotherapy depending on the primary etiology of her malignancy. I will see the patient back for follow-up visit in around 3 weeks for evaluation and discussion of her molecular studies and possible treatment  options at that time. The patient was advised to call immediately if she has any other concerning symptoms in the interval. The patient voices understanding of current disease status and treatment options  and is in agreement with the current care plan.  All questions were answered. The patient knows to call the clinic with any problems, questions or concerns. We can certainly see the patient much sooner if necessary.  Thank you so much for allowing me to participate in the care of Yvette Roberts. I will continue to follow up the patient with you and assist in her care.  The total time spent in the appointment was 90 minutes.  Disclaimer: This note was dictated with voice recognition software. Similar sounding words can inadvertently be transcribed and may not be corrected upon review.   Eilleen Kempf December 20, 2020, 2:41 PM

## 2020-12-20 NOTE — Progress Notes (Signed)
   Oncology Nurse Navigator Documentation  Oncology Nurse Navigator Flowsheets 12/20/2020  Abnormal Finding Date 11/23/2020  Confirmed Diagnosis Date 11/23/2020  Diagnosis Status Pending Molecular Studies  Navigator Follow Up Date: 12/27/2020  Navigator Follow Up Reason: Appointment Review  Navigator Location [facility]  Referral Date to RadOnc/MedOnc 12/11/2020  Navigator Encounter Type Clinic/MDC;Initial MedOnc  Patient Visit Type Initial;MedOnc  Treatment Phase Pre-Tx/Tx Discussion  Barriers/Navigation Needs Coordination of Care;Education  Education Other/ I was able to meet Ms. Buddenhagen today.  Very nice lady with family support by her side.  Her plan of care is further workup.  Per Dr. Julien Nordmann, I requested pathology to look and see if patients recent path had enough to send for molecular testing. Wait for response.  I did update GYN NP on Dr. Worthy Flank discussion and plan of care for Ms. Randal.    Interventions Coordination of Care;Education;Psycho-Social Support  Acuity Level 2-Minimal Needs (1-2 Barriers Identified)  Coordination of Care Other  Education Method Verbal  Time Spent with Patient 30

## 2020-12-21 ENCOUNTER — Ambulatory Visit: Payer: Medicare Other | Attending: Internal Medicine

## 2020-12-21 DIAGNOSIS — G8929 Other chronic pain: Secondary | ICD-10-CM | POA: Insufficient documentation

## 2020-12-21 DIAGNOSIS — M25562 Pain in left knee: Secondary | ICD-10-CM | POA: Insufficient documentation

## 2020-12-21 NOTE — Therapy (Signed)
[place], [place], [address] Phone: [phone]   Fax:  [phone]  Patient Details  Name: Yvette Roberts MRN: [id number] Date of Birth: 1956-04-08 Referring Provider:  Antonieta Pert, MD  Encounter Date: 12/21/2020    Patient arrived for initial evaluation with referral for L knee osteoarthritis. She explains that she no longer has pain or issues with her L knee, but she thought she was coming to an appointment regarding the chest tubes that were placed for drainage of R pleural effusion recently. Patient was informed that she was at physical therapy and referral received for PT was for her left knee if she would like to continue with session for her knee. Patient declined as she has not had recent issues with her knee but voiced understanding that she may obtain new referral from her doctor to initiate PT. She was advised to contact her doctor to clarify any future appointments or follow ups in case of any potential confusion/misunderstanding with other appointments.   Haydee Monica, PT, DPT 12/21/20 3:17 PM   [facility] [address] [place], [place], [address] Phone: [phone]   Fax:  [phone]

## 2020-12-25 ENCOUNTER — Other Ambulatory Visit: Payer: Self-pay

## 2020-12-25 NOTE — Progress Notes (Signed)
MU review.

## 2020-12-26 ENCOUNTER — Other Ambulatory Visit (HOSPITAL_COMMUNITY)
Admission: RE | Admit: 2020-12-26 | Discharge: 2020-12-26 | Disposition: A | Discharge status: Home / Self Care | Payer: Medicare Other | Source: Ambulatory Visit | Attending: Gynecologic Oncology | Admitting: Gynecologic Oncology

## 2020-12-26 ENCOUNTER — Other Ambulatory Visit: Payer: Self-pay

## 2020-12-26 ENCOUNTER — Inpatient Hospital Stay: Payer: Medicare Other | Attending: Gynecologic Oncology | Admitting: Gynecologic Oncology

## 2020-12-26 ENCOUNTER — Encounter: Payer: Self-pay | Admitting: Gynecologic Oncology

## 2020-12-26 ENCOUNTER — Encounter: Payer: Self-pay | Admitting: Oncology

## 2020-12-26 VITALS — BP 121/67 | HR 75 | Temp 97.3°F | Resp 18 | Ht 66.0 in | Wt 221.0 lb

## 2020-12-26 DIAGNOSIS — R9389 Abnormal findings on diagnostic imaging of other specified body structures: Secondary | ICD-10-CM | POA: Diagnosis not present

## 2020-12-26 DIAGNOSIS — Z9981 Dependence on supplemental oxygen: Secondary | ICD-10-CM | POA: Diagnosis not present

## 2020-12-26 DIAGNOSIS — C799 Secondary malignant neoplasm of unspecified site: Secondary | ICD-10-CM

## 2020-12-26 DIAGNOSIS — C778 Secondary and unspecified malignant neoplasm of lymph nodes of multiple regions: Secondary | ICD-10-CM | POA: Diagnosis present

## 2020-12-26 DIAGNOSIS — K219 Gastro-esophageal reflux disease without esophagitis: Secondary | ICD-10-CM | POA: Diagnosis not present

## 2020-12-26 DIAGNOSIS — C55 Malignant neoplasm of uterus, part unspecified: Secondary | ICD-10-CM | POA: Diagnosis present

## 2020-12-26 DIAGNOSIS — R630 Anorexia: Secondary | ICD-10-CM | POA: Insufficient documentation

## 2020-12-26 DIAGNOSIS — Z79899 Other long term (current) drug therapy: Secondary | ICD-10-CM | POA: Diagnosis not present

## 2020-12-26 DIAGNOSIS — G893 Neoplasm related pain (acute) (chronic): Secondary | ICD-10-CM | POA: Insufficient documentation

## 2020-12-26 DIAGNOSIS — R6881 Early satiety: Secondary | ICD-10-CM | POA: Insufficient documentation

## 2020-12-26 DIAGNOSIS — R140 Abdominal distension (gaseous): Secondary | ICD-10-CM | POA: Diagnosis not present

## 2020-12-26 DIAGNOSIS — C7982 Secondary malignant neoplasm of genital organs: Secondary | ICD-10-CM | POA: Insufficient documentation

## 2020-12-26 DIAGNOSIS — Z5111 Encounter for antineoplastic chemotherapy: Secondary | ICD-10-CM | POA: Diagnosis not present

## 2020-12-26 DIAGNOSIS — J449 Chronic obstructive pulmonary disease, unspecified: Secondary | ICD-10-CM | POA: Insufficient documentation

## 2020-12-26 DIAGNOSIS — Z01419 Encounter for gynecological examination (general) (routine) without abnormal findings: Secondary | ICD-10-CM | POA: Diagnosis not present

## 2020-12-26 DIAGNOSIS — C801 Malignant (primary) neoplasm, unspecified: Secondary | ICD-10-CM | POA: Diagnosis not present

## 2020-12-26 DIAGNOSIS — C7951 Secondary malignant neoplasm of bone: Secondary | ICD-10-CM | POA: Diagnosis present

## 2020-12-26 DIAGNOSIS — I10 Essential (primary) hypertension: Secondary | ICD-10-CM | POA: Insufficient documentation

## 2020-12-26 DIAGNOSIS — R109 Unspecified abdominal pain: Secondary | ICD-10-CM | POA: Insufficient documentation

## 2020-12-26 DIAGNOSIS — C787 Secondary malignant neoplasm of liver and intrahepatic bile duct: Secondary | ICD-10-CM | POA: Insufficient documentation

## 2020-12-26 DIAGNOSIS — Z7982 Long term (current) use of aspirin: Secondary | ICD-10-CM | POA: Insufficient documentation

## 2020-12-26 DIAGNOSIS — E119 Type 2 diabetes mellitus without complications: Secondary | ICD-10-CM | POA: Diagnosis not present

## 2020-12-26 DIAGNOSIS — J910 Malignant pleural effusion: Secondary | ICD-10-CM

## 2020-12-26 DIAGNOSIS — E669 Obesity, unspecified: Secondary | ICD-10-CM | POA: Diagnosis not present

## 2020-12-26 DIAGNOSIS — F32A Depression, unspecified: Secondary | ICD-10-CM | POA: Diagnosis not present

## 2020-12-26 MED ORDER — LIDOCAINE 5 % EX PTCH: 1.0000 | MEDICATED_PATCH | CUTANEOUS | 0 refills | Status: DC

## 2020-12-26 NOTE — Progress Notes (Signed)
GYNECOLOGIC ONCOLOGY NEW PATIENT CONSULTATION   Patient Name: Yvette Roberts  Patient Age: 65 y.o. Date of Service: 12/26/20 Referring Provider: Dr. Audie Box  Primary Care Provider: Adolph Pollack, FNP Consulting Provider: Eugene Garnet, MD   Assessment/Plan:  Postmenopausal patient with strong family history of breast cancer presenting with diffusely metastatic adenocarcinoma suspected to be of GYN primary versus two concurrent primary cancers.  I reviewed in detail with the patient (who presented alone to her appointment today) findings from pleural fluid cytology as well as recent PET. Given picture, this seems most consistent with a metastatic GYN primary. This is supported by her abdominal symptoms as well as pelvic exam findings today. Due to the avidity of her uterus, my hope is that her endometrial biopsy today will supply diagnostic information to allow finalization of a treatment plan. I have also recommend re-review (and addition of any IHC) of her pleural fluid specifically for GYN markers.   Given distribution and extent of disease, if this is confirmed to be a GYN primary, then my recommendation would be to proceed with neoadjuvant chemotherapy. We discussed that the goal of future surgery would be for optimal cytoreduction (which would not be possible at this time). She will also benefit from genetic counseling referral once diagnosis established.   I will call her with biopsy results from today once available and will make additional recommendations based on these results.   A copy of this note was sent to the patient's referring provider.   75 minutes of total time was spent for this patient encounter, including preparation, face-to-face counseling with the patient and coordination of care, and documentation of the encounter.   Eugene Garnet, MD  Division of Gynecologic Oncology  Department of Obstetrics and Gynecology  [facility]   ___________________________________________  Chief Complaint: Chief Complaint  Patient presents with  . Metastatic adenocarcinoma (HCC)    History of Present Illness:  Yvette Roberts is a 65 y.o. y.o. female who is seen in consultation at the request of Dr. Tonia Brooms for an evaluation of metastatic adenocarcinoma with imaging findings concerning for a gyn primary.  Patient reports in January having onset of abdominal pain, bloating, and decreased appetite.  She was seen in the emergency department on 1/19 and diagnosed with reflux.    Shortly after, she got sick with what she thought was a cold.  She woke up one night and she felt as if she could not breathe.  She called 911 and was brought to the emergency department.  She required intubation given respiratory failure and had a chest tube placed in the setting of a large right pleural effusion with mediastinal shift.  Chest tube was removed prior to discharge.   Since being discharged from the hospital, she notes that her breathing has improved.  She is still on oxygen but trying to wean down.  She uses 3 L when out side of the house and 2 L at home.  If she is doing things around the house, she often has to rest because she gets tired and short of breath.  She still has some residual right sided chest pain at the site of her chest tube.  She is using lidocaine for this pain.  In terms of her abdominal symptoms, these are similar to what she describes in January.  She is scheduled to see a gastroenterologist next week.  She endorses burping, intermittent bloating, some weight loss, early satiety.  Her appetite is somewhat  improved and she is eating at least something at most meals.  She denies any vaginal bleeding or discharge.  Patient lives in [place] by herself although has a lot of family in town.  Additionally, her son lives in [place].  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  PAST MEDICAL  HISTORY:  Past Medical History:  Diagnosis Date  . Allergy   . Arthritis   . COPD (chronic obstructive pulmonary disease) ([place])   . Depression   . Esophageal reflux 07/21/2014  . Frozen shoulder 05/10/2015   Injected 05/10/2015   . Obesity, unspecified 07/21/2014  . Resistant hypertension 07/21/2014  . Type 2 diabetes mellitus without complication ([place]) 1/61/0960     PAST SURGICAL HISTORY:  Past Surgical History:  Procedure Laterality Date  . LAPAROSCOPIC ABDOMINAL EXPLORATION    . miscarr      OB/GYN HISTORY:  OB History  Gravida Para Term Preterm AB Living  1 1          SAB IAB Ectopic Multiple Live Births               # Outcome Date GA Lbr Len/2nd Weight Sex Delivery Anes PTL Lv  1 Para             No LMP recorded. Patient is postmenopausal.  Age at menarche: Unsure, at some point during high school Age at menopause: Does not remember, in her 109s.  Denies any postmenopausal bleeding Hx of HRT: Denies Hx of STDs: Denies Last pap: Thinks that she has had 1 in the last few years History of abnormal pap smears: Denies  SCREENING STUDIES:  Last mammogram: 2020  Last colonoscopy: Never had  MEDICATIONS: Outpatient Encounter Medications as of 12/26/2020  Medication Sig  . lidocaine (LIDODERM) 5 % Place 1 patch onto the skin daily. Remove & Discard patch within 12 hours.  Marland Kitchen amLODipine (NORVASC) 5 MG tablet Take 5 mg by mouth daily.  Marland Kitchen aspirin 81 MG tablet Take 81 mg by mouth daily.  . Cholecalciferol (VITAMIN D) 125 MCG (5000 UT) CAPS Take 5,000 Units by mouth daily.  . famotidine (PEPCID) 20 MG tablet Take 1 tablet (20 mg total) by mouth 2 (two) times daily as needed for heartburn or indigestion.  Marland Kitchen FLUoxetine (PROZAC) 40 MG capsule TAKE 1 CAPSULE(40 MG) BY MOUTH DAILY (Patient taking differently: Take 40 mg by mouth daily.)  . lidocaine (LIDODERM) 5 % Place 1 patch onto the skin daily. Remove & Discard patch within 12 hours or as directed by MD  . MELATONIN PO Take 10  mg by mouth at bedtime.  Marland Kitchen omeprazole (PRILOSEC) 40 MG capsule TAKE 1 CAPSULE(40 MG) BY MOUTH DAILY (Patient taking differently: Take 40 mg by mouth daily.)  . OXYGEN Inhale 2 L/min into the lungs continuous.  Marland Kitchen saccharomyces boulardii (FLORASTOR) 250 MG capsule Take 1 capsule (250 mg total) by mouth 2 (two) times daily.  . traZODone (DESYREL) 50 MG tablet Take 1 tablet (50 mg total) by mouth at bedtime as needed for sleep.   No facility-administered encounter medications on file as of 12/26/2020.    ALLERGIES:  Allergies  Allergen Reactions  . Eggs Or Egg-Derived Products Hives  . Influenza Vaccines Hives  . Peanut-Containing Drug Products Hives  . Penicillins Hives  . Sulfa Antibiotics Hives  . Codeine Rash     FAMILY HISTORY:  Family History  Problem Relation Age of Onset  . Breast cancer Sister        ?  age of onset     SOCIAL HISTORY:  Social Connections: Not on file    REVIEW OF SYSTEMS:  Pertinent positives as per HPI.  Additionally patient endorses joint pain and anxiety. Denies  fevers, chills, unexplained weight changes. Denies hearing loss, neck lumps or masses, mouth sores, ringing in ears or voice changes. Denies cough or wheezing.   Denies chest pain or palpitations. Denies leg swelling. Denies blood in stools, constipation, diarrhea, nausea, vomiting. Denies pain with intercourse, dysuria, frequency, hematuria or incontinence. Denies hot flashes, pelvic pain, vaginal bleeding or vaginal discharge.   Denies back pain or muscle pain/cramps. Denies itching, rash, or wounds. Denies dizziness, headaches, numbness or seizures. Denies swollen lymph nodes or glands, denies easy bruising or bleeding. Denies anxiety, depression, confusion, or decreased concentration.  Physical Exam:  Vital Signs for this encounter:  Blood pressure 121/67, pulse 75, temperature (!) 97.3 F (36.3 C), temperature source Tympanic, resp. rate 18, height $RemoveBe'5\' 6"'QKnCzGXGD$  (1.676 m), weight 221 lb  (100.2 kg), SpO2 100 %. Body mass index is 35.67 kg/m. General: Alert, oriented, no acute distress.  HEENT: Normocephalic, atraumatic. Sclera anicteric.  Chest: Somewhat short of breath with conversation. Decreased breath sounds at right lung base, good air flow at apex. Left lung fields clear to auscultation. No wheezing. Cardiovascular: Regular rate and rhythm, no murmurs, rubs, or gallops.  Abdomen: Obese. Normoactive bowel sounds. Soft, nondistended, nontender to palpation. No masses or hepatosplenomegaly appreciated. No palpable fluid wave.  Extremities: Grossly normal range of motion. Warm, well perfused. No edema bilaterally.  Skin: No rashes or lesions.  Lymphatics: No cervical, supraclavicular, or inguinal adenopathy.  GU:  Normal external female genitalia.  No lesions. No discharge or bleeding. On pelvic exam, the patient has moderately atrophic vaginal mucosa.  Cervix is small and grossly normal.  Pap test and ECC collected (with cytobrush). There is a moderately firm ring of tissue at the introitus, no distinct nodularity although somewhat abnormal.  On bimanual exam, the lower uterine segment/upper cervix is quite bulbous and at least 4-5 cm, although not extending to the sidewall.  There is minimal mobility of the uterus which is somewhat difficult to appreciate in terms of size but approximately 8-10 cm.  On rectovaginal exam, the posterior cervix and lower uterine segment are quite firm, no nodularity appreciated.  Procedure: Endometrial biopsy Preoperative diagnosis: hypermetabolic uterus on PET Postoperative diagnosis: same as above Physician: Berline Lopes MD Specimen: endometrial biopsy The procedure was discussed with the patient including risks, benefits and alternatives. After obtaining verbal consent, the patient was placed in dorsal lithotomy position. A speculum was placed int he vagina and the cervix was well visualized. Betadine was used x3 to cleanse the cervix. A  single-toothed tenaculum was placed on the anterior lip of the cervix. An endometrial pipelle was then inserted to a depth of approximately 7cm. Two passes were performed with scant tissue obtained. This was placed in formalin to be sent for rush pathologic review.   LABORATORY AND RADIOLOGIC DATA:  Outside medical records were reviewed to synthesize the above history, along with the history and physical obtained during the visit.   Lab Results  Component Value Date   WBC 5.8 12/20/2020   HGB 12.2 12/20/2020   HCT 37.9 12/20/2020   PLT 328 12/20/2020   GLUCOSE 108 (H) 12/20/2020   CHOL 168 12/10/2018   TRIG 64.0 12/10/2018   HDL 56.60 12/10/2018   LDLCALC 98 12/10/2018   ALT 19 12/20/2020   AST 25 12/20/2020  NA 139 12/20/2020   K 3.1 (L) 12/20/2020   CL 100 12/20/2020   CREATININE 0.64 12/20/2020   BUN 6 (L) 12/20/2020   CO2 29 12/20/2020   HGBA1C 6.3 (H) 11/23/2020   Pleural fluid: - Malignant cells consistent with non-small cell carcinoma  - See comment   SPECIMEN ADEQUACY:  Satisfactory for evaluation   DIAGNOSTIC COMMENTS:  Morphologically, the malignant cells are suggestive of an  adenocarcinoma. However, immunohistochemical stains show that the tumor cells are positive for CK7 with patchy labeling for p63 and CK 5/6  whereas they are negative for TTF-1, Napsin-A, CK20, calretinin, D2-40,  GATA3, ER and CDX2. Ki-67 stain shows increased proliferative index.  Positive staining for CK5/6 and p63 is suggestive of elements of  squamous differentiation. Dr. Saralyn Pilar and Dr. Tresa Moore reviewed the case  and concurs with the diagnosis. Dr. Valeta Harms was notified on 12/01/2020.   PET on 2/11: 1. Unusual pattern of hypermetabolic metastatic disease. 2. Intense metabolic activity associated with pleural thickening in the RIGHT hemithorax consistent with pleural metastasis versus primary pleural malignancy. 3. Isolated LEFT internal mammary nodal metastasis. 4. Intensely  hypermetabolic lesion within the liver beneath the hypermetabolic lesions within the diaphragm. Differential includes local extension of pleural metastasis, hematogenous liver metastasis versus hepatic abscess. Favor hematogenous liver metastasis. 5. Hypermetabolic retroperitoneal metastatic adenopathy and internal iliac adenopathy. 6. Hypermetabolic thickening within the LEFT rectus muscle consistent with muscular skeletal metastasis. 7. Intense metabolic activity within enlarged uterus. Recommend gyn evaluation and potential MRI of the uterus to evaluate for primary uterine carcinoma. Unusual site of metastasis. Benign leiomyoma can be hypermetabolic but typically not this intense. 8. Rim of hypermetabolic activity in the vagina towards the introitus. Recommend evaluation as above.

## 2020-12-26 NOTE — Patient Instructions (Signed)
It was a pleasure meeting you today. I will call you with your biopsy results once I have them. We will then discuss whether we can develop a treatment plan or if there are other tests/procedures that you might need to help best determine if this is one cancer process or multiple.

## 2020-12-26 NOTE — Progress Notes (Signed)
Requested additional staining to check for Gyn origin of cancer on accession [id number] with [facility].

## 2020-12-27 ENCOUNTER — Encounter: Payer: Self-pay | Admitting: *Deleted

## 2020-12-27 NOTE — Progress Notes (Signed)
I followed up on Dr. Charisse March note on her visit with Yvette Roberts.  Per her note, cancer looks like primary GYN not lung but more staining has been ordered. I notified Dr. Julien Nordmann.

## 2020-12-28 ENCOUNTER — Telehealth: Payer: Self-pay | Admitting: Oncology

## 2020-12-28 ENCOUNTER — Telehealth: Payer: Self-pay

## 2020-12-28 ENCOUNTER — Telehealth: Payer: Self-pay | Admitting: Gynecologic Oncology

## 2020-12-28 ENCOUNTER — Encounter: Payer: Self-pay | Admitting: Oncology

## 2020-12-28 LAB — CYTOLOGY - PAP

## 2020-12-28 NOTE — Telephone Encounter (Signed)
Spoke with pharmacist and stated that the prior authorization was obtained. Authorization # [id number]. The approval is for 12-26-20 until further notice. Pharmacist will notify patient when prescription is ready for pick up.

## 2020-12-28 NOTE — Telephone Encounter (Signed)
Left message to schedule appointment with Dr. Alvy Bimler.  Requested a return call.

## 2020-12-28 NOTE — Telephone Encounter (Signed)
Called the patient to discuss biopsy results. No answer. Left VM with callback requested. Will release results in mychart. Have asked pathology to add HER2 to biopsy given p53 staining.  Jeral Pinch MD Gynecologic Oncology

## 2020-12-28 NOTE — Progress Notes (Signed)
Requested Her2 testing on accession [id number] with [facility] via email.

## 2020-12-29 ENCOUNTER — Encounter: Payer: Self-pay | Admitting: *Deleted

## 2020-12-29 LAB — CYTOLOGY - NON PAP

## 2020-12-29 NOTE — Telephone Encounter (Signed)
Called Yvette Roberts and asked if she can see Dr. Alvy Bimler on 01/02/21.  She is not able to come in that day due to having another appointment and transportation issues.  Scheduled appointment with Dr. Alvy Bimler on 3/14 with 1:30 arrival at the [facility].  Also advised her that Dr. Worthy Flank appointment on 01/10/21 has been canceled and that Dr. Alvy Bimler will manage her chemotherapy treatments because she specialized in GYN cancers.  Provided her with my contact information and encouraged her to call with any questions.

## 2020-12-29 NOTE — Progress Notes (Signed)
After receiving an update from GYN surgery and Dr. Julien Nordmann, it is felt that Yvette Roberts needs to be followed with GYN med onc.  I have updated GYN nurse navigator Santiago Glad and she will be getting the patient an appt next week with Dr. Alvy Bimler.  Dr. Alvy Bimler is also aware of patient.

## 2021-01-03 ENCOUNTER — Encounter: Payer: Self-pay | Admitting: Hematology and Oncology

## 2021-01-03 ENCOUNTER — Encounter: Payer: Self-pay | Admitting: Oncology

## 2021-01-03 DIAGNOSIS — J910 Malignant pleural effusion: Secondary | ICD-10-CM | POA: Insufficient documentation

## 2021-01-03 DIAGNOSIS — C787 Secondary malignant neoplasm of liver and intrahepatic bile duct: Secondary | ICD-10-CM | POA: Insufficient documentation

## 2021-01-03 NOTE — Progress Notes (Signed)
Requested MSI testing on accession [id number] with [facility] via email.

## 2021-01-05 LAB — SURGICAL PATHOLOGY

## 2021-01-08 ENCOUNTER — Ambulatory Visit (HOSPITAL_COMMUNITY)
Admission: RE | Admit: 2021-01-08 | Discharge: 2021-01-08 | Disposition: A | Discharge status: Home / Self Care | Payer: Medicare Other | Source: Ambulatory Visit | Attending: Hematology and Oncology | Admitting: Hematology and Oncology

## 2021-01-08 ENCOUNTER — Telehealth: Payer: Self-pay | Admitting: *Deleted

## 2021-01-08 ENCOUNTER — Encounter: Payer: Self-pay | Admitting: Hematology and Oncology

## 2021-01-08 ENCOUNTER — Inpatient Hospital Stay (HOSPITAL_BASED_OUTPATIENT_CLINIC_OR_DEPARTMENT_OTHER): Payer: Medicare Other | Admitting: Hematology and Oncology

## 2021-01-08 ENCOUNTER — Other Ambulatory Visit: Payer: Self-pay

## 2021-01-08 ENCOUNTER — Inpatient Hospital Stay: Payer: Medicare Other

## 2021-01-08 ENCOUNTER — Encounter: Payer: Self-pay | Admitting: Oncology

## 2021-01-08 ENCOUNTER — Other Ambulatory Visit: Payer: Self-pay | Admitting: Hematology and Oncology

## 2021-01-08 VITALS — BP 136/73 | HR 87 | Temp 97.5°F | Resp 18 | Ht 66.0 in | Wt 222.2 lb

## 2021-01-08 DIAGNOSIS — G893 Neoplasm related pain (acute) (chronic): Secondary | ICD-10-CM | POA: Diagnosis present

## 2021-01-08 DIAGNOSIS — J910 Malignant pleural effusion: Secondary | ICD-10-CM

## 2021-01-08 DIAGNOSIS — C787 Secondary malignant neoplasm of liver and intrahepatic bile duct: Secondary | ICD-10-CM | POA: Diagnosis present

## 2021-01-08 DIAGNOSIS — C55 Malignant neoplasm of uterus, part unspecified: Secondary | ICD-10-CM | POA: Diagnosis present

## 2021-01-08 DIAGNOSIS — E119 Type 2 diabetes mellitus without complications: Secondary | ICD-10-CM

## 2021-01-08 DIAGNOSIS — E669 Obesity, unspecified: Secondary | ICD-10-CM

## 2021-01-08 DIAGNOSIS — Z7189 Other specified counseling: Secondary | ICD-10-CM | POA: Diagnosis not present

## 2021-01-08 DIAGNOSIS — E66812 Obesity, class 2: Secondary | ICD-10-CM

## 2021-01-08 MED ORDER — ONDANSETRON HCL 8 MG PO TABS: 8.0000 mg | ORAL_TABLET | Freq: Three times a day (TID) | ORAL | 1 refills | Status: DC | PRN

## 2021-01-08 MED ORDER — OXYCODONE HCL 10 MG PO TABS: 10.0000 mg | ORAL_TABLET | ORAL | 0 refills | Status: DC | PRN

## 2021-01-08 MED ORDER — PROCHLORPERAZINE MALEATE 10 MG PO TABS: 10.0000 mg | ORAL_TABLET | Freq: Four times a day (QID) | ORAL | 1 refills | Status: DC | PRN

## 2021-01-08 MED ORDER — DEXAMETHASONE 4 MG PO TABS: ORAL_TABLET | ORAL | 6 refills | Status: DC

## 2021-01-08 MED ORDER — LIDOCAINE-PRILOCAINE 2.5-2.5 % EX CREA: TOPICAL_CREAM | CUTANEOUS | 3 refills | Status: DC

## 2021-01-08 NOTE — Assessment & Plan Note (Signed)
Her examination revealed persistent pleural effusion Repeat chest x-ray showed persistent effusion but at this point in time, I do not plan to order thoracentesis

## 2021-01-08 NOTE — Progress Notes (Signed)
[facility] FOLLOW-UP progress notes  Patient Care Team: [name], FNP as PCP - General (Family Medicine) Awanda Mink Craige Cotta, RN as Oncology Nurse Navigator (Oncology)  CHIEF COMPLAINTS/PURPOSE OF VISIT:  Metastatic uterine cancer, for further management  HISTORY OF PRESENTING ILLNESS:  Yvette Roberts 65 y.o. female was transferred to my care due to recent findings of new gynecological malignancy. 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 Summary of her history is as follows: Oncology History Overview Note  Adenocarcinoma with component of squamous differentiation and serous component  HER2 negative IHC MMR intact   Uterine cancer ([place])  11/23/2020 Pathology Results   FINAL MICROSCOPIC DIAGNOSIS:  - Malignant cells consistent with non-small cell carcinoma  - See comment   SPECIMEN ADEQUACY:  Satisfactory for evaluation   DIAGNOSTIC COMMENTS:  Morphologically, the malignant cells are suggestive of an adenocarcinoma.  However, immunohistochemical stains show that the tumor cells are positive for CK7 with patchy labeling for p63 and CK 5/6 whereas they are negative for TTF-1, Napsin-A, CK20, calretinin, D2-40, GATA3, ER and CDX2.  Ki-67 stain shows increased proliferative index. Positive staining for CK5/6 and p63 is suggestive of elements of squamous differentiation.   11/23/2020 Imaging   Ct chest 1. Near complete evacuation of the right pleural effusion after right-sided pigtail drainage catheter placement. Trace residual effusion is partially loculated. 2. Dense consolidation throughout the right lung, greatest at the right lung base. Findings are consistent with a combination of pneumonia  and atelectasis. Follow-up imaging after completion of appropriate medical management is recommended to exclude underlying neoplasm. 3. Indeterminate hypodensity right lobe liver. If further evaluation is desired, dedicated liver CT or MRI may be useful.   12/08/2020 PET scan   1. Unusual pattern of hypermetabolic metastatic disease. 2. Intense metabolic activity associated with pleural thickening in the RIGHT hemithorax consistent with pleural metastasis versus primary pleural malignancy. 3. Isolated LEFT internal mammary nodal metastasis. 4. Intensely hypermetabolic lesion within the liver beneath the hypermetabolic lesions within the diaphragm. Differential includes local extension of pleural metastasis, hematogenous liver metastasis versus hepatic abscess. Favor hematogenous liver metastasis.  5. Hypermetabolic retroperitoneal metastatic adenopathy and internal iliac adenopathy. 6. Hypermetabolic thickening within the LEFT rectus muscle consistent with muscular skeletal metastasis. 7. Intense metabolic activity within enlarged uterus. Recommend gyn evaluation and potential MRI of the uterus to evaluate for primary uterine carcinoma. Unusual site of metastasis. Benign leiomyoma can be hypermetabolic but typically not this intense. 8. Rim of hypermetabolic activity in the vagina towards the introitus. Recommend evaluation as above.   12/20/2020 Initial Diagnosis   Uterine cancer ([place])   12/26/2020 Pathology Results   A. ENDOCERVIX, CURETTAGE:  - Scant benign squamous epithelium.   B. ENDOMETRIUM, BIOPSY:  - Adenocarcinoma, see comment.   COMMENT:   B. There are malignant glands some which have marked cytologic atypia. There if focal squamous differentiation. The tumor is positive for cytokeratin 7, PAX-8, GATA-3, ER (10% strong), p53, and WT-1. The cells are negative for cytokeratin 20, CDX-2, TTF-1, and NapsinA. The overall findings are consistent with a gynecologic adenocarcinoma. While  squamous differentiation is most commonly seen in endometrioid/endocervical adenocarcinoma, the strong p53 staining and focal marked atypia is suggestive of  a serous component.    01/03/2021 Cancer Staging   Staging form: Corpus Uteri - Carcinoma and Carcinosarcoma, AJCC 8th Edition - Clinical: FIGO Stage IVB (cT3b, cN2a, pM1) - Signed by [name], MD on 01/03/2021   01/17/2021 -  Chemotherapy    Patient is on Treatment Plan: UTERINE CARBOPLATIN AUC 6 / PACLITAXEL Q21D      Metastasis to liver ([place])  01/03/2021 Initial Diagnosis   Metastasis to liver ([place])   01/17/2021 -  Chemotherapy    Patient is on Treatment Plan: UTERINE CARBOPLATIN AUC 6 / PACLITAXEL Q21D       She is receiving oxygen therapy for hypoxemic respiratory failure secondary to pleural effusion Her breathing is stable She complain of referred pain on the right side of her chest wall and to her shoulders Her appetite is fair She denies recent nausea or constipation  MEDICAL HISTORY:  Past Medical History:  Diagnosis Date  . Allergy   . Arthritis   . COPD (chronic obstructive pulmonary disease) ([place])   . Depression   . Esophageal reflux 07/21/2014  . Frozen shoulder 05/10/2015   Injected 05/10/2015   . Obesity, unspecified 07/21/2014  . Resistant hypertension 07/21/2014  . Type 2 diabetes mellitus without complication ([place]) 3/66/4403    SURGICAL HISTORY: Past Surgical History:  Procedure Laterality Date  . LAPAROSCOPIC ABDOMINAL EXPLORATION    . miscarr      SOCIAL HISTORY: Social History   Socioeconomic History  . Marital status: Divorced    Spouse name: Not on file  . Number of children: Not on file  . Years of education: Not on file  . Highest education level: Not on file  Occupational History  . Not on file  Tobacco Use  . Smoking status: Never Smoker  . Smokeless tobacco: Never Used  Substance and Sexual Activity  . Alcohol use: Not on file  . Drug use: Not on file  . Sexual activity: Not on  file  Other Topics Concern  . 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 Breast cancer Sister        ? age of onset  . Breast cancer Maternal Aunt   . Breast cancer Maternal Aunt   . Breast cancer Maternal Aunt     ALLERGIES:  is allergic to eggs or egg-derived products, influenza vaccines, peanut-containing drug products, penicillins, sulfa antibiotics, and codeine.  MEDICATIONS:  Current Outpatient Medications  Medication Sig Dispense Refill  . dexamethasone (DECADRON) 4 MG tablet Take 2 tabs at the night before and 2 tab the morning of chemotherapy, every 3 weeks, by mouth x 6 cycles 36 tablet 6  . oxyCODONE 10 MG TABS Take 1 tablet (10 mg total) by mouth every 4 (four) hours as needed for severe pain. 30 tablet 0  . amLODipine (NORVASC) 5 MG tablet Take 5 mg  by mouth daily.    Marland Kitchen aspirin 81 MG tablet Take 81 mg by mouth daily.    . Cholecalciferol (VITAMIN D) 125 MCG (5000 UT) CAPS Take 5,000 Units by mouth daily.    . famotidine (PEPCID) 20 MG tablet Take 1 tablet (20 mg total) by mouth 2 (two) times daily as needed for heartburn or indigestion. 30 tablet 0  . FLUoxetine (PROZAC) 40 MG capsule TAKE 1 CAPSULE(40 MG) BY MOUTH DAILY (Patient taking differently: Take 40 mg by mouth daily.) 15 capsule 0  . lidocaine (LIDODERM) 5 % Place 1 patch onto the skin daily. Remove & Discard patch within 12 hours or as directed by MD 30 patch 0  . lidocaine (LIDODERM) 5 % Place 1  patch onto the skin daily. Remove & Discard patch within 12 hours. 30 patch 0  . lidocaine-prilocaine (EMLA) cream Apply to affected area once 30 g 3  . MELATONIN PO Take 10 mg by mouth at bedtime.    Marland Kitchen omeprazole (PRILOSEC) 40 MG capsule TAKE 1 CAPSULE(40 MG) BY MOUTH DAILY (Patient taking differently: Take 40 mg by mouth daily.) 30 capsule 0  . ondansetron (ZOFRAN) 8 MG tablet Take 1 tablet (8 mg total) by mouth every 8 (eight) hours as needed. Start on the third day after chemotherapy. 30 tablet 1  . OXYGEN Inhale 2 L/min into the lungs continuous.    . prochlorperazine (COMPAZINE) 10 MG tablet Take 1 tablet (10 mg total) by mouth every 6 (six) hours as needed (Nausea or vomiting). 30 tablet 1  . saccharomyces boulardii (FLORASTOR) 250 MG capsule Take 1 capsule (250 mg total) by mouth 2 (two) times daily. 30 capsule 0  . traZODone (DESYREL) 50 MG tablet Take 1 tablet (50 mg total) by mouth at bedtime as needed for sleep. 30 tablet 0   No current facility-administered medications for this visit.    REVIEW OF SYSTEMS:   Constitutional: Denies fevers, chills or abnormal night sweats Eyes: Denies blurriness of vision, double vision or watery eyes Ears, nose, mouth, throat, and face: Denies mucositis or sore throat Respiratory: Denies cough, dyspnea or wheezes Cardiovascular: Denies palpitation, chest discomfort  Gastrointestinal:  Denies nausea, heartburn or change in bowel habits Skin: Denies abnormal skin rashes Lymphatics: Denies new lymphadenopathy or easy bruising Neurological:Denies numbness, tingling or new weaknesses Behavioral/Psych: Mood is stable, no new changes  All other systems were reviewed with the patient and are negative.  PHYSICAL EXAMINATION: ECOG PERFORMANCE STATUS: 2 - Symptomatic, <50% confined to bed  Vitals:   01/08/21 1341  BP: 136/73  Pulse: 87  Resp: 18  Temp: (!) 97.5 F (36.4 C)  SpO2: 97%   Filed Weights   01/08/21 1341  Weight: 222 lb 3.2 oz  (100.8 kg)    GENERAL:alert, no distress and comfortable.  She has oxygen delivered via nasal cannula, sitting on the wheelchair, limiting complete physical examination SKIN: skin color, texture, turgor are normal, no rashes or significant lesions EYES: normal, conjunctiva are pink and non-injected, sclera clear OROPHARYNX:no exudate, normal lips, buccal mucosa, and tongue  NECK: supple, thyroid normal size, non-tender, without nodularity LYMPH:  no palpable lymphadenopathy in the cervical, axillary or inguinal LUNGS: She has slight increased breathing effort with reduced breath sounds on both lung bases, right greater than left  HEART: regular rate & rhythm and no murmurs with moderate bilateral lower extremity edema ABDOMEN:abdomen soft, non-tender and normal bowel sounds Musculoskeletal:no cyanosis of digits and no clubbing  PSYCH: alert & oriented x 3  with fluent speech NEURO: no focal motor/sensory deficits  LABORATORY DATA:  I have reviewed the data as listed Lab Results  Component Value Date   WBC 5.8 12/20/2020   HGB 12.2 12/20/2020   HCT 37.9 12/20/2020   MCV 84.2 12/20/2020   PLT 328 12/20/2020   Recent Labs    11/23/20 0324 11/23/20 0421 11/23/20 0849 11/24/20 0156 11/27/20 0243 11/28/20 0024 12/20/20 1346  NA 132*   < >  --    < > 135 133* 139  K 4.5   < >  --    < > 3.2* 3.3* 3.1*  CL 96*  --   --    < > 103 100 100  CO2 22  --   --    < > $R'23 23 29  'Rp$ GLUCOSE 212*  --   --    < > 97 104* 108*  BUN 21  --   --    < > 6* 5* 6*  CREATININE 1.28*  --   --    < > 0.58 0.55 0.64  CALCIUM 9.5  --   --    < > 8.2* 8.4* 9.1  GFRNONAA 46*  --   --    < > >60 >60 >60  PROT 8.2*  --  7.0  --   --   --  7.4  ALBUMIN 3.4*  --   --   --   --   --  3.1*  AST 23  --   --   --   --   --  25  ALT 23  --   --   --   --   --  19  ALKPHOS 63  --   --   --   --   --  67  BILITOT 0.9  --   --   --   --   --  0.6  BILIDIR 0.2  --   --   --   --   --   --   IBILI 0.7  --   --   --    --   --   --    < > = values in this interval not displayed.    RADIOGRAPHIC STUDIES: I have reviewed recent PET/CT imaging with the patient and her son I have personally reviewed the radiological images as listed and agreed with the findings in the report. DG Chest 2 View  Result Date: 01/08/2021 CLINICAL DATA:  History of malignant pleural effusion. EXAM: CHEST - 2 VIEW COMPARISON:  November 28, 2020 FINDINGS: Persistent hazy opacification of the right lung is noted. Mild left basilar atelectasis is noted. There is right-sided pleural thickening with a small right pleural effusion. This is increased in size when compared to the prior study. A small left pleural effusion is seen. The heart size and mediastinal contours are within normal limits. Both lungs are clear. Degenerative changes seen throughout the thoracic spine. IMPRESSION: 1. Persistent moderate severity infiltrate throughout the right lung. 2. Mild left basilar atelectasis. 3. Right-sided pleural thickening with bilateral pleural effusions. Electronically Signed   By: Virgina Norfolk M.D.   On: 01/08/2021 15:32    ASSESSMENT & PLAN:  Uterine cancer ([place]) I have reviewed imaging studies with the patient and her son The patient have stage IV metastatic uterine cancer The goals of treatment is palliative Explained to them why surgery is not indicated  We reviewed the NCCN guidelines We discussed the role of  chemotherapy. The intent is of palliative intent.  We discussed some of the risks, benefits, side-effects of carboplatin & Taxol. Treatment is intravenous, every 3 weeks x 6 cycles  Some of the short term side-effects included, though not limited to, including weight loss, life threatening infections, risk of allergic reactions, need for transfusions of blood products, nausea, vomiting, change in bowel habits, loss of hair, admission to hospital for various reasons, and risks of death.   Long term side-effects are also  discussed including risks of infertility, permanent damage to nerve function, hearing loss, chronic fatigue, kidney damage with possibility needing hemodialysis, and rare secondary malignancy including bone marrow disorders.  The patient is aware that the response rates discussed earlier is not guaranteed.  After a long discussion, patient made an informed decision to proceed with the prescribed plan of care.   Patient education material was dispensed. We discussed premedication with dexamethasone before chemotherapy. I recommend chemo education class and port placement before treatment I plan to see her again next week to review test results and pain management  Metastasis to liver [facility]) She has referred pain I recommend trial of oxycodone We will recheck her liver enzymes before treatment  Malignant pleural effusion Her examination revealed persistent pleural effusion Repeat chest x-ray showed persistent effusion but at this point in time, I do not plan to order thoracentesis  Cancer associated pain We discussed cancer pain management I warned her about risk of constipation and sedation We discussed narcotic refill policy  Type 2 diabetes mellitus without complication ([place]) We will monitor blood sugar control carefully while she is receiving treatment  Goals of care, counseling/discussion I have reviewed goals of care with the patient and her son She is aware that treatment goal is palliative  Class II obesity She has significant central obesity I plan slight upfront dose adjustment of paclitaxel due to anticipated high risk of side effects and neuropathy   Orders Placed This Encounter  Procedures  . IR IMAGING GUIDED PORT INSERTION    Standing Status:   Future    Standing Expiration Date:   01/08/2022    Order Specific Question:   Reason for Exam (SYMPTOM  OR DIAGNOSIS REQUIRED)    Answer:   need port for chemo to start 3/22    Order Specific Question:   Preferred Imaging  Location?    Answer:   [facility]  . DG Chest 2 View    Standing Status:   Future    Number of Occurrences:   1    Standing Expiration Date:   01/08/2022    Order Specific Question:   Reason for exam:    Answer:   malignant pleural effusion    Order Specific Question:   Preferred imaging location?    Answer:   [facility]  . CBC with Differential ([place] Only)    Standing Status:   Standing    Number of Occurrences:   20    Standing Expiration Date:   01/08/2022  . CMP ([place] only)    Standing Status:   Standing    Number of Occurrences:   20    Standing Expiration Date:   01/08/2022    All questions were answered. The patient knows to call the clinic with any problems, questions or concerns. The total time spent in the appointment was 80 minutes encounter with patients including review of chart and various tests results, discussions about plan of care and coordination of care plan  [name], MD 01/08/2021 7:17 PM

## 2021-01-08 NOTE — Progress Notes (Signed)
Met with Yvette Roberts and her son before her visit with Dr. Alvy Bimler.  Provided her with the [place] folder and encouraged her to call with any questions or needs.  Also explained that Garnet Koyanagi, CSW will meet with her after Dr. Calton Dach appointment to discuss options for applying for disability.

## 2021-01-08 NOTE — Progress Notes (Signed)
START ON PATHWAY REGIMEN - Uterine     A cycle is every 21 days:     Paclitaxel      Carboplatin   **Always confirm dose/schedule in your pharmacy ordering system**  Patient Characteristics: Endometrioid, Newly Diagnosed (Clinical Staging), Nonsurgical Candidate, Stage III-IV Histology: Endometrioid Therapeutic Status: Newly Diagnosed (Clinical Staging) AJCC M Category: pM1 AJCC 8 Stage Grouping: IVB AJCC T Category: cT3 AJCC N Category: cN1  Intent of Therapy: Non-Curative / Palliative Intent, Discussed with Patient

## 2021-01-08 NOTE — Assessment & Plan Note (Signed)
She has significant central obesity I plan slight upfront dose adjustment of paclitaxel due to anticipated high risk of side effects and neuropathy

## 2021-01-08 NOTE — Assessment & Plan Note (Signed)
We discussed cancer pain management I warned her about risk of constipation and sedation We discussed narcotic refill policy

## 2021-01-08 NOTE — Assessment & Plan Note (Signed)
She has referred pain I recommend trial of oxycodone We will recheck her liver enzymes before treatment

## 2021-01-08 NOTE — Telephone Encounter (Signed)
I received a call from patient HPOA, [name]. She updated me that patient would like to speak to someone about disability.  I listened as she explained.  Patient is coming to see Dr. Alvy Bimler today and I explained that I will update Santiago Glad RN of patients needs.  She was thankful for the help.

## 2021-01-08 NOTE — Assessment & Plan Note (Signed)
I have reviewed imaging studies with the patient and her son The patient have stage IV metastatic uterine cancer The goals of treatment is palliative Explained to them why surgery is not indicated  We reviewed the NCCN guidelines We discussed the role of chemotherapy. The intent is of palliative intent.  We discussed some of the risks, benefits, side-effects of carboplatin & Taxol. Treatment is intravenous, every 3 weeks x 6 cycles  Some of the short term side-effects included, though not limited to, including weight loss, life threatening infections, risk of allergic reactions, need for transfusions of blood products, nausea, vomiting, change in bowel habits, loss of hair, admission to hospital for various reasons, and risks of death.   Long term side-effects are also discussed including risks of infertility, permanent damage to nerve function, hearing loss, chronic fatigue, kidney damage with possibility needing hemodialysis, and rare secondary malignancy including bone marrow disorders.  The patient is aware that the response rates discussed earlier is not guaranteed.  After a long discussion, patient made an informed decision to proceed with the prescribed plan of care.   Patient education material was dispensed. We discussed premedication with dexamethasone before chemotherapy. I recommend chemo education class and port placement before treatment I plan to see her again next week to review test results and pain management

## 2021-01-08 NOTE — Assessment & Plan Note (Signed)
We will monitor blood sugar control carefully while she is receiving treatment

## 2021-01-08 NOTE — Assessment & Plan Note (Signed)
I have reviewed goals of care with the patient and her son She is aware that treatment goal is palliative

## 2021-01-08 NOTE — Telephone Encounter (Signed)
Yvette Roberts called and confirmed her appointments for today.  She said she will need a wheelchair and an oxygen tank during her visit.

## 2021-01-09 ENCOUNTER — Telehealth: Payer: Self-pay

## 2021-01-09 ENCOUNTER — Telehealth: Payer: Self-pay | Admitting: *Deleted

## 2021-01-09 ENCOUNTER — Encounter: Payer: Self-pay | Admitting: Licensed Clinical Social Worker

## 2021-01-09 NOTE — Telephone Encounter (Signed)
[id number] Study: Contacted patient regarding participation in the [place] blood collection study. Informed patient that participation is voluntary and explained the purpose of the study. Informed patient the study involves collecting blood samples and medical/demographic information from patients prior to starting treatment and participants are given a $50 gift card for their participation. Patient respectfully declined due to having too much going on at this time.  Thanked patient for taking the time to talk today. Dr. Alvy Bimler notified.  Foye Spurling, BSN, RN Clinical Research Nurse 01/09/2021 12:45 PM

## 2021-01-09 NOTE — Telephone Encounter (Signed)
-----   Message from [name], MD sent at 01/08/2021  7:10 PM EDT ----- Regarding: CXR and port Please call her: CXR looks ok, no need thoracentesis Please try to get port put in before chemo next week

## 2021-01-09 NOTE — Telephone Encounter (Signed)
Called and left a message asking her to call the office back. Appt canceled on 3/22 with Dr. Alvy Bimler due to port placement. Rescheduled appt to 3/23 at 1130 in person with Dr. Alvy Bimler.

## 2021-01-09 NOTE — Telephone Encounter (Signed)
Called and left below message. Ask her to call the office back. 

## 2021-01-09 NOTE — Progress Notes (Signed)
[place] Work  Clinical Social Work was referred by Therapist, sports for pt questions re: disability.  Clinical Social Worker contacted patient by phone  to offer support and assess for needs.  Unfortunately, due to patient's age, she would not qualify for social security disability but could draw on social security retirement. Informed patient of this information.  Patient also asked about applying for Medicaid. CSW reviewed some basic criteria and gave information on how to apply which patient will work on today.  CSW also reviewed other resources available through [facility] including grants and food pantry.    No other needs at this time. CSW provided direct contact information to patient to call with any other questions.     [place], [place] Worker [employer]

## 2021-01-10 ENCOUNTER — Ambulatory Visit: Payer: Medicare Other | Admitting: Internal Medicine

## 2021-01-10 ENCOUNTER — Other Ambulatory Visit: Payer: Medicare Other

## 2021-01-10 NOTE — Progress Notes (Signed)
Pharmacist Chemotherapy Monitoring - Initial Assessment    Anticipated start date: 01/17/21  Regimen:  . Are orders appropriate based on the patient's diagnosis, regimen, and cycle? Yes . Does the plan date match the patient's scheduled date? Yes . Is the sequencing of drugs appropriate? Yes . Are the premedications appropriate for the patient's regimen? Yes . Prior Authorization for treatment is: Approved o If applicable, is the correct biosimilar selected based on the patient's insurance? not applicable  Organ Function and Labs: Marland Kitchen Are dose adjustments needed based on the patient's renal function, hepatic function, or hematologic function? No . Are appropriate labs ordered prior to the start of patient's treatment? Yes . Other organ system assessment, if indicated: N/A . The following baseline labs, if indicated, have been ordered: N/A  Dose Assessment: . Are the drug doses appropriate? Yes . Are the following correct: o Drug concentrations Yes o IV fluid compatible with drug Yes o Administration routes Yes o Timing of therapy Yes . If applicable, does the patient have documented access for treatment and/or plans for port-a-cath placement? yes . If applicable, have lifetime cumulative doses been properly documented and assessed? yes Lifetime Dose Tracking  No doses have been documented on this patient for the following tracked chemicals: Doxorubicin, Epirubicin, Idarubicin, Daunorubicin, Mitoxantrone, Bleomycin, Oxaliplatin, Carboplatin, Liposomal Doxorubicin  o   Toxicity Monitoring/Prevention: . The patient has the following take home antiemetics prescribed: Ondansetron and Prochlorperazine . The patient has the following take home medications prescribed: N/A . Medication allergies and previous infusion related reactions, if applicable, have been reviewed and addressed. Yes . The patient's current medication list has been assessed for drug-drug interactions with their  chemotherapy regimen. no significant drug-drug interactions were identified on review.  Order Review: . Are the treatment plan orders signed? Yes . Is the patient scheduled to see a provider prior to their treatment? Yes  I verify that I have reviewed each item in the above checklist and answered each question accordingly.   Kennith Center, Pharm.D., CPP 01/10/2021@3 :36 PM

## 2021-01-11 ENCOUNTER — Other Ambulatory Visit: Payer: Self-pay | Admitting: Hematology and Oncology

## 2021-01-11 ENCOUNTER — Telehealth: Payer: Self-pay

## 2021-01-11 ENCOUNTER — Other Ambulatory Visit: Payer: Self-pay | Admitting: Internal Medicine

## 2021-01-11 DIAGNOSIS — C55 Malignant neoplasm of uterus, part unspecified: Secondary | ICD-10-CM

## 2021-01-11 DIAGNOSIS — C787 Secondary malignant neoplasm of liver and intrahepatic bile duct: Secondary | ICD-10-CM

## 2021-01-11 MED ORDER — HYDROMORPHONE HCL 2 MG PO TABS: 2.0000 mg | ORAL_TABLET | ORAL | 0 refills | Status: DC | PRN

## 2021-01-11 NOTE — Telephone Encounter (Signed)
Morgen called back and confirmed her appointments for next week.  She is aware of the port placement appointment on 3/22 and to see Dr. Alvy Bimler on 3/23 at 11:30.

## 2021-01-11 NOTE — Telephone Encounter (Signed)
I sent prescription Dilaudid to [facility] Tell her it needs to be [facility] because most pharmacy does not stock this

## 2021-01-11 NOTE — Telephone Encounter (Signed)
She called and left a message. She is asking for different type of pain medication. She has been taking the oxycodone Rx and she is having nausea every time she takes the Rx.

## 2021-01-11 NOTE — Telephone Encounter (Signed)
Called and given below message.

## 2021-01-11 NOTE — Telephone Encounter (Signed)
She verbalized understanding.

## 2021-01-12 ENCOUNTER — Telehealth: Payer: Self-pay | Admitting: General Practice

## 2021-01-12 NOTE — Telephone Encounter (Signed)
[place] CSW Progress Notes  Call from patient, as she is assigned to CSW Engineer, petroleum, [place] and asked her to contact patient. Also left patient VM w CSW [place]'s contact infor.  Edwyna Shell, LCSW Clinical Social Worker Phone:  [phone]

## 2021-01-14 ENCOUNTER — Other Ambulatory Visit: Payer: Self-pay | Admitting: Radiology

## 2021-01-15 ENCOUNTER — Other Ambulatory Visit: Payer: Self-pay | Admitting: Radiology

## 2021-01-15 ENCOUNTER — Telehealth: Payer: Self-pay | Admitting: Licensed Clinical Social Worker

## 2021-01-15 NOTE — Telephone Encounter (Signed)
CSW received message from [place] that pt had called on Friday. CSW contacted patient who stated that she did not remember calling and has no needs at this time. CSW encouraged patient to call back if she needs anything in the future.   Christeen Douglas, LCSW

## 2021-01-16 ENCOUNTER — Encounter (HOSPITAL_COMMUNITY): Payer: Self-pay

## 2021-01-16 ENCOUNTER — Ambulatory Visit (HOSPITAL_COMMUNITY)
Admission: RE | Admit: 2021-01-16 | Discharge: 2021-01-16 | Disposition: A | Discharge status: Home / Self Care | Payer: Medicare Other | Source: Ambulatory Visit | Attending: Hematology and Oncology | Admitting: Hematology and Oncology

## 2021-01-16 ENCOUNTER — Encounter: Payer: Self-pay | Admitting: Hematology and Oncology

## 2021-01-16 ENCOUNTER — Telehealth: Payer: Self-pay | Admitting: Oncology

## 2021-01-16 ENCOUNTER — Other Ambulatory Visit: Payer: Self-pay | Admitting: Radiology

## 2021-01-16 ENCOUNTER — Telehealth: Payer: Medicare Other | Admitting: Hematology and Oncology

## 2021-01-16 ENCOUNTER — Other Ambulatory Visit: Payer: Self-pay

## 2021-01-16 DIAGNOSIS — I10 Essential (primary) hypertension: Secondary | ICD-10-CM | POA: Insufficient documentation

## 2021-01-16 DIAGNOSIS — C3490 Malignant neoplasm of unspecified part of unspecified bronchus or lung: Secondary | ICD-10-CM | POA: Insufficient documentation

## 2021-01-16 DIAGNOSIS — C787 Secondary malignant neoplasm of liver and intrahepatic bile duct: Secondary | ICD-10-CM

## 2021-01-16 DIAGNOSIS — E119 Type 2 diabetes mellitus without complications: Secondary | ICD-10-CM | POA: Insufficient documentation

## 2021-01-16 DIAGNOSIS — C55 Malignant neoplasm of uterus, part unspecified: Secondary | ICD-10-CM | POA: Diagnosis not present

## 2021-01-16 DIAGNOSIS — J449 Chronic obstructive pulmonary disease, unspecified: Secondary | ICD-10-CM | POA: Insufficient documentation

## 2021-01-16 DIAGNOSIS — K219 Gastro-esophageal reflux disease without esophagitis: Secondary | ICD-10-CM | POA: Insufficient documentation

## 2021-01-16 HISTORY — PX: IR IMAGING GUIDED PORT INSERTION: IMG5740

## 2021-01-16 LAB — CBC WITH DIFFERENTIAL/PLATELET
Abs Immature Granulocytes: 0.01 10*3/uL (ref 0.00–0.07)
Basophils Absolute: 0 10*3/uL (ref 0.0–0.1)
Basophils Relative: 0 %
Eosinophils Absolute: 0 10*3/uL (ref 0.0–0.5)
Eosinophils Relative: 0 %
HCT: 39.3 % (ref 36.0–46.0)
Hemoglobin: 12.9 g/dL (ref 12.0–15.0)
Immature Granulocytes: 0 %
Lymphocytes Relative: 20 %
Lymphs Abs: 1.3 10*3/uL (ref 0.7–4.0)
MCH: 27.4 pg (ref 26.0–34.0)
MCHC: 32.8 g/dL (ref 30.0–36.0)
MCV: 83.4 fL (ref 80.0–100.0)
Monocytes Absolute: 0.6 10*3/uL (ref 0.1–1.0)
Monocytes Relative: 9 %
Neutro Abs: 4.5 10*3/uL (ref 1.7–7.7)
Neutrophils Relative %: 71 %
Platelets: 337 10*3/uL (ref 150–400)
RBC: 4.71 MIL/uL (ref 3.87–5.11)
RDW: 16.8 % — ABNORMAL HIGH (ref 11.5–15.5)
WBC: 6.5 10*3/uL (ref 4.0–10.5)
nRBC: 0 % (ref 0.0–0.2)

## 2021-01-16 LAB — COMPREHENSIVE METABOLIC PANEL
ALT: 15 U/L (ref 0–44)
AST: 28 U/L (ref 15–41)
Albumin: 3.5 g/dL (ref 3.5–5.0)
Alkaline Phosphatase: 69 U/L (ref 38–126)
Anion gap: 18 — ABNORMAL HIGH (ref 5–15)
BUN: 9 mg/dL (ref 8–23)
CO2: 27 mmol/L (ref 22–32)
Calcium: 9.8 mg/dL (ref 8.9–10.3)
Chloride: 93 mmol/L — ABNORMAL LOW (ref 98–111)
Creatinine, Ser: 0.69 mg/dL (ref 0.44–1.00)
GFR, Estimated: >60 mL/min (ref >60)
Glucose, Bld: 99 mg/dL (ref 70–99)
Potassium: 3.3 mmol/L — ABNORMAL LOW (ref 3.5–5.1)
Sodium: 138 mmol/L (ref 135–145)
Total Bilirubin: 1.3 mg/dL — ABNORMAL HIGH (ref 0.3–1.2)
Total Protein: 7.4 g/dL (ref 6.5–8.1)

## 2021-01-16 LAB — PROTIME-INR
INR: 1.1 (ref 0.8–1.2)
Prothrombin Time: 13.7 seconds (ref 11.4–15.2)

## 2021-01-16 MED ORDER — LIDOCAINE-EPINEPHRINE 1 %-1:100000 IJ SOLN
INTRAMUSCULAR | Status: AC
  Filled 2021-01-16: qty 1

## 2021-01-16 MED ORDER — LIDOCAINE-EPINEPHRINE 1 %-1:100000 IJ SOLN
INTRAMUSCULAR | Status: AC | PRN
  Administered 2021-01-16 (×2): 10 mL via INTRADERMAL

## 2021-01-16 MED ORDER — HEPARIN SOD (PORK) LOCK FLUSH 100 UNIT/ML IV SOLN
INTRAVENOUS | Status: AC
  Filled 2021-01-16: qty 5

## 2021-01-16 MED ORDER — MIDAZOLAM HCL 2 MG/2ML IJ SOLN
INTRAMUSCULAR | Status: AC | PRN
  Administered 2021-01-16 (×4): 1 mg via INTRAVENOUS

## 2021-01-16 MED ORDER — MIDAZOLAM HCL 2 MG/2ML IJ SOLN
INTRAMUSCULAR | Status: AC
  Filled 2021-01-16: qty 2

## 2021-01-16 MED ORDER — SODIUM CHLORIDE 0.9 % IV SOLN: INTRAVENOUS | Status: DC

## 2021-01-16 MED ORDER — FENTANYL CITRATE (PF) 100 MCG/2ML IJ SOLN
INTRAMUSCULAR | Status: AC
  Filled 2021-01-16: qty 2

## 2021-01-16 MED ORDER — FENTANYL CITRATE (PF) 100 MCG/2ML IJ SOLN
INTRAMUSCULAR | Status: AC | PRN
  Administered 2021-01-16 (×2): 50 ug via INTRAVENOUS

## 2021-01-16 NOTE — Sedation Documentation (Signed)
Patient is resting comfortably, with eyes closed, in NAD.

## 2021-01-16 NOTE — Procedures (Signed)
Interventional Radiology Procedure Note  Procedure: Chest port  Indication: Non-small cell lung cancer  Findings: Please refer to procedural dictation for full description.  Complications: None  EBL: < 10 mL  Miachel Roux, MD [phone]

## 2021-01-16 NOTE — Consult Note (Signed)
Chief Complaint: Patient was seen in consultation today for Port-A-Cath placement  Referring Physician(s): [name]  Supervising Physician: Mir, Sharen Heck  Patient Status: [facility] - Out-pt  History of Present Illness: Yvette Roberts is a 65 y.o. female with past medical history of arthritis, COPD, depression, GERD, obesity, hypertension, diabetes and newly diagnosed stage IV uterine cancer.  She presents today for Port-A-Cath placement for palliative chemotherapy.  Past Medical History:  Diagnosis Date   Allergy    Arthritis    COPD (chronic obstructive pulmonary disease) ([place])    Depression    Esophageal reflux 07/21/2014   Frozen shoulder 05/10/2015   Injected 05/10/2015    Obesity, unspecified 07/21/2014   Resistant hypertension 07/21/2014   Type 2 diabetes mellitus without complication ([place]) 01/16/2247    Past Surgical History:  Procedure Laterality Date   LAPAROSCOPIC ABDOMINAL EXPLORATION     miscarr      Allergies: Eggs or egg-derived products, Influenza vaccines, Peanut-containing drug products, Penicillins, Sulfa antibiotics, and Codeine  Medications: Prior to Admission medications   Medication Sig Start Date End Date Taking? Authorizing Provider  amLODipine (NORVASC) 5 MG tablet Take 5 mg by mouth daily.    [provider]  aspirin 81 MG tablet Take 81 mg by mouth daily.    [provider]  Cholecalciferol (VITAMIN D) 125 MCG (5000 UT) CAPS Take 5,000 Units by mouth daily.    [provider]  dexamethasone (DECADRON) 4 MG tablet Take 2 tabs at the night before and 2 tab the morning of chemotherapy, every 3 weeks, by mouth x 6 cycles 01/08/21   [name], MD  famotidine (PEPCID) 20 MG tablet Take 1 tablet (20 mg total) by mouth 2 (two) times daily as needed for heartburn or indigestion. 11/15/20   Wieters, Hallie C, PA-C  FLUoxetine (PROZAC) 40 MG capsule TAKE 1 CAPSULE(40 MG) BY MOUTH DAILY Patient taking differently: Take 40  mg by mouth daily. 08/21/20   [name], DO  HYDROmorphone (DILAUDID) 2 MG tablet Take 1 tablet (2 mg total) by mouth every 4 (four) hours as needed for severe pain. 01/11/21   [name], MD  lidocaine (LIDODERM) 5 % Place 1 patch onto the skin daily. Remove & Discard patch within 12 hours or as directed by MD 12/01/20   Geradine Girt, DO  lidocaine (LIDODERM) 5 % Place 1 patch onto the skin daily. Remove & Discard patch within 12 hours. 12/26/20   Lafonda Mosses, MD  lidocaine-prilocaine (EMLA) cream Apply to affected area once 01/08/21   [name], MD  MELATONIN PO Take 10 mg by mouth at bedtime.    [provider]  omeprazole (PRILOSEC) 40 MG capsule TAKE 1 CAPSULE(40 MG) BY MOUTH DAILY Patient taking differently: Take 40 mg by mouth daily. 03/20/20   [name], DO  ondansetron (ZOFRAN) 8 MG tablet Take 1 tablet (8 mg total) by mouth every 8 (eight) hours as needed. Start on the third day after chemotherapy. 01/08/21   [name], MD  OXYGEN Inhale 2 L/min into the lungs continuous.    [provider]  prochlorperazine (COMPAZINE) 10 MG tablet Take 1 tablet (10 mg total) by mouth every 6 (six) hours as needed (Nausea or vomiting). 01/08/21   [name], MD  saccharomyces boulardii (FLORASTOR) 250 MG capsule Take 1 capsule (250 mg total) by mouth 2 (two) times daily. 11/30/20   Geradine Girt, DO  traZODone (DESYREL) 50 MG tablet Take 1 tablet (50 mg  total) by mouth at bedtime as needed for sleep. 11/30/20   Geradine Girt, DO     Family History  Problem Relation Age of Onset   Breast cancer Sister        ? age of onset   Breast cancer Maternal Aunt    Breast cancer Maternal Aunt    Breast cancer Maternal Aunt     Social History   Socioeconomic History   Marital status: Divorced    Spouse name: Not on file   Number of children: Not on file   Years of education: Not on file   Highest education level: Not on file  Occupational History   Not on  file  Tobacco Use   Smoking status: Never Smoker   Smokeless tobacco: Never Used  Substance and Sexual Activity   Alcohol use: Not on file   Drug use: Not on file   Sexual activity: Not on file  Other Topics Concern   Not on file  Social History Narrative   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  She does have dyspnea with exertion as well as abdominal/pelvic pain  Vital Signs: Vitals:   01/16/21 1256  BP: (!) 161/92  Pulse: 99  Resp: 18  Temp: 97.7 F (36.5 C)  SpO2: 96%      Physical Exam awake, alert.  Chest with diminished breath sounds at bases.  Heart with regular rate and rhythm.  Abdomen mildly distended, few bowel sounds, tender primarily right lateral/ lower abdominal regions; pretibial edema noted bilaterally  Imaging: DG Chest 2 View  Result Date: 01/08/2021 CLINICAL DATA:  History of malignant pleural effusion. EXAM: CHEST - 2 VIEW COMPARISON:  November 28, 2020 FINDINGS: Persistent hazy opacification of the right lung is noted. Mild left basilar atelectasis is noted. There is right-sided pleural thickening with a small right pleural effusion. This is increased in size when compared to the prior study. A small left pleural effusion is seen. The heart size and mediastinal contours are within normal limits. Both lungs are clear. Degenerative changes seen throughout the thoracic spine. IMPRESSION: 1. Persistent moderate severity infiltrate throughout the right lung. 2. Mild left basilar  atelectasis. 3. Right-sided pleural thickening with bilateral pleural effusions. Electronically Signed   By: Virgina Norfolk M.D.   On: 01/08/2021 15:32    Labs:  CBC: Recent Labs    11/26/20 0143 11/27/20 0243 11/28/20 0024 12/20/20 1346  WBC 12.8* 8.6 8.1 5.8  HGB 11.4* 10.5* 10.4* 12.2  HCT 35.0* 30.7* 31.3* 37.9  PLT 272 252 287 328    COAGS: No results for input(s): INR, APTT in the last 8760 hours.  BMP: Recent Labs    11/26/20 0143 11/27/20 0243 11/28/20 0024 12/20/20 1346  NA 134* 135 133* 139  K 3.6 3.2* 3.3* 3.1*  CL 100 103 100 100  CO2 23 23 23 29   GLUCOSE 101* 97 104* 108*  BUN 9 6* 5* 6*  CALCIUM 8.4* 8.2* 8.4* 9.1  CREATININE 0.64 0.58 0.55 0.64  GFRNONAA >60 >60 >60 >60    LIVER FUNCTION TESTS: Recent Labs    11/23/20 0324 11/23/20 0849 12/20/20 1346  BILITOT 0.9  --  0.6  AST 23  --  25  ALT 23  --  19  ALKPHOS 63  --  67  PROT 8.2* 7.0 7.4  ALBUMIN 3.4*  --  3.1*    TUMOR MARKERS: No results for input(s): AFPTM, CEA, CA199, CHROMGRNA in the last 8760 hours.  Assessment and Plan: 65 y.o. female with past medical history of arthritis, COPD, depression, GERD, obesity, hypertension, diabetes and newly diagnosed stage IV uterine cancer.  She presents today for Port-A-Cath placement for palliative chemotherapy.Risks and benefits of image guided port-a-catheter placement was discussed with the patient including, but not limited to bleeding, infection, pneumothorax, or fibrin sheath development and need for additional procedures.  All of the patient's questions were answered, patient is agreeable to proceed. Consent signed and in chart.     Thank you for this interesting consult.  I greatly enjoyed meeting MINETTA KRISHER and look forward to participating in their care.  A copy of this report was sent to the requesting provider on this date.  Electronically Signed: D. Rowe Robert, PA-C 01/16/2021, 12:50 PM   I spent a total of  25  minutes in face to face in clinical consultation, greater than 50% of which was counseling/coordinating care for Port-A-Cath placement

## 2021-01-16 NOTE — Discharge Instructions (Signed)
Please call Interventional Radiology clinic [phone] with any questions or concerns.  You may remove your dressing and shower tomorrow.  DO NOT use EMLA (lidocaine) cream for 2 weeks after port placement as this cream will remove surgical glue on your incision.   Implanted Port Insertion, Care After This sheet gives you information about how to care for yourself after your procedure. Your health care provider may also give you more specific instructions. If you have problems or questions, contact your health care provider. What can I expect after the procedure? After the procedure, it is common to have:  Discomfort at the port insertion site.  Bruising on the skin over the port. This should improve over 3-4 days. Follow these instructions at home: [facility] care  After your port is placed, you will get a manufacturer's information card. The card has information about your port. Keep this card with you at all times.  Take care of the port as told by your health care provider. Ask your health care provider if you or a family member can get training for taking care of the port at home. A home health care nurse may also take care of the port.  Make sure to remember what type of port you have. Incision care  Follow instructions from your health care provider about how to take care of your port insertion site. Make sure you: ? Wash your hands with soap and water before  you change your bandage (dressing). If soap and water are not available, use hand sanitizer.  Leave skin glue in place. These skin closures may need to stay in place for 2 weeks or longer.   Check your port insertion site every day for signs of infection. Check for: ? Redness, swelling, or pain. ? Fluid or blood. ? Warmth. ? Pus or a bad smell.      Activity  Return to your normal activities as told by your health care provider. Ask your health care provider what activities are safe for you.  Do not lift anything  that is heavier than 10 lb (4.5 kg), or the limit that you are told, until your health care provider says that it is safe. General instructions  Take over-the-counter and prescription medicines only as told by your health care provider.  Do not take baths, swim, or use a hot tub until your health care provider approves. You may remove the dressing tomorrow and shower.  Do not drive for 24 hours if you were given a sedative during your procedure.  Wear a medical alert bracelet in case of an emergency. This will tell any health care providers that you have a port.  Keep all follow-up visits as told by your health care provider. This is important. Contact a health care provider if:  You cannot flush your port with saline as directed, or you cannot draw blood from the port.  You have a fever or chills.  You have redness, swelling, or pain around your port insertion site.  You have fluid or blood coming from your port insertion site.  Your port insertion site feels warm to the touch.  You have pus or a bad smell coming from the port insertion site. Get help right away if:  You have chest pain or shortness of breath.  You have bleeding from your port that you cannot control. Summary  Take care of the port as told by your health care provider. Keep the manufacturer's information card with you at all times.  Change your dressing as told by your health care provider.  Contact a health care provider if you have a fever or chills or if you have redness, swelling, or pain around your port insertion site.  Keep all follow-up visits as told by your health care provider. This information is not intended to replace advice given to you by your health care provider. Make sure you discuss any questions you have with your health care provider. Document Revised: 05/12/2018 Document Reviewed: 05/12/2018 Elsevier Patient Education  2021 [place].   Moderate Conscious Sedation, Adult, Care  After This sheet gives you information about how to care for yourself after your procedure. Your health care provider may also give you more specific instructions. If you have problems or questions, contact your health care provider. What can I expect after the procedure? After the procedure, it is common to have:  Sleepiness for several hours.  Impaired judgment for several hours.  Difficulty with balance.  Vomiting if you eat too soon. Follow these instructions at home: For the time period you were told by your health care provider:  Rest.  Do not participate in activities where you could fall or become injured.  Do not drive or use machinery.  Do not drink alcohol.  Do not take sleeping pills or medicines that cause drowsiness.  Do not make important decisions or sign legal documents.  Do not take care of children on your own.      Eating and drinking  Follow the diet recommended by your health care provider.  Drink enough fluid to keep your urine pale yellow.  If you vomit: ? Drink water, juice, or soup when you can drink without vomiting. ? Make sure you have little or no nausea before eating solid foods.   General instructions  Take over-the-counter and prescription medicines only as told by your health care provider.  Have a responsible adult stay with you for the time you are told. It is important to have someone help care for you until you are awake and alert.  Do not smoke.  Keep all follow-up visits as told by your health care provider. This is important. Contact a health care provider if:  You are still sleepy or having trouble with balance after 24 hours.  You feel light-headed.  You keep feeling nauseous or you keep vomiting.  You develop a rash.  You have a fever.  You have redness or swelling around the IV site. Get help right away if:  You have trouble breathing.  You have new-onset confusion at home. Summary  After the procedure,  it is common to feel sleepy, have impaired judgment, or feel nauseous if you eat too soon.  Rest after you get home. Know the things you should not do after the procedure.  Follow the diet recommended by your health care provider and drink enough fluid to keep your urine pale yellow.  Get help right away if you have trouble breathing or new-onset confusion at home. This information is not intended to replace advice given to you by your health care provider. Make sure you discuss any questions you have with your health care provider. Document Revised: 02/11/2020 Document Reviewed: 09/09/2019 Elsevier Patient Education  2021 [employer].

## 2021-01-16 NOTE — Progress Notes (Signed)
Called pt to introduce myself as her Arboriculturist and to discuss the [employer].  Pt has 2 insurances so copay assistance shouldn't be needed.  I left a msg requesting she return my call if she's interested in applying for the grant.

## 2021-01-16 NOTE — Telephone Encounter (Signed)
Requested a CBC p diff and CMP to be drawn today with port placement with Juliann Pulse in IR.

## 2021-01-17 ENCOUNTER — Inpatient Hospital Stay: Payer: Medicare Other

## 2021-01-17 ENCOUNTER — Telehealth: Payer: Self-pay | Admitting: Oncology

## 2021-01-17 ENCOUNTER — Inpatient Hospital Stay (HOSPITAL_BASED_OUTPATIENT_CLINIC_OR_DEPARTMENT_OTHER): Payer: Medicare Other | Admitting: Hematology and Oncology

## 2021-01-17 ENCOUNTER — Encounter: Payer: Self-pay | Admitting: Hematology and Oncology

## 2021-01-17 ENCOUNTER — Other Ambulatory Visit: Payer: Self-pay | Admitting: Hematology and Oncology

## 2021-01-17 VITALS — BP 117/98 | HR 82 | Temp 98.2°F | Resp 18

## 2021-01-17 DIAGNOSIS — C55 Malignant neoplasm of uterus, part unspecified: Secondary | ICD-10-CM

## 2021-01-17 DIAGNOSIS — Z5111 Encounter for antineoplastic chemotherapy: Secondary | ICD-10-CM | POA: Diagnosis not present

## 2021-01-17 DIAGNOSIS — J910 Malignant pleural effusion: Secondary | ICD-10-CM

## 2021-01-17 DIAGNOSIS — C787 Secondary malignant neoplasm of liver and intrahepatic bile duct: Secondary | ICD-10-CM

## 2021-01-17 DIAGNOSIS — G893 Neoplasm related pain (acute) (chronic): Secondary | ICD-10-CM

## 2021-01-17 MED ORDER — SODIUM CHLORIDE 0.9 % IV SOLN
140.0000 mg/m2 | Freq: Once | INTRAVENOUS | Status: AC
  Administered 2021-01-17: 306 mg via INTRAVENOUS
  Filled 2021-01-17: qty 51

## 2021-01-17 MED ORDER — FOSAPREPITANT DIMEGLUMINE INJECTION 150 MG
150.0000 mg | Freq: Once | INTRAVENOUS | Status: AC
  Administered 2021-01-17: 150 mg via INTRAVENOUS
  Filled 2021-01-17: qty 150

## 2021-01-17 MED ORDER — PALONOSETRON HCL INJECTION 0.25 MG/5ML
0.2500 mg | Freq: Once | INTRAVENOUS | Status: AC
  Administered 2021-01-17: 0.25 mg via INTRAVENOUS

## 2021-01-17 MED ORDER — FAMOTIDINE IN NACL 20-0.9 MG/50ML-% IV SOLN
20.0000 mg | Freq: Once | INTRAVENOUS | Status: AC
  Administered 2021-01-17: 20 mg via INTRAVENOUS

## 2021-01-17 MED ORDER — DIPHENHYDRAMINE HCL 50 MG/ML IJ SOLN
25.0000 mg | Freq: Once | INTRAMUSCULAR | Status: AC
  Administered 2021-01-17: 25 mg via INTRAVENOUS

## 2021-01-17 MED ORDER — DIPHENHYDRAMINE HCL 50 MG/ML IJ SOLN
INTRAMUSCULAR | Status: AC
  Filled 2021-01-17: qty 1

## 2021-01-17 MED ORDER — SODIUM CHLORIDE 0.9 % IV SOLN
685.8000 mg | Freq: Once | INTRAVENOUS | Status: AC
  Administered 2021-01-17: 690 mg via INTRAVENOUS
  Filled 2021-01-17: qty 69

## 2021-01-17 MED ORDER — SODIUM CHLORIDE 0.9% FLUSH
10.0000 mL | INTRAVENOUS | Status: DC | PRN
  Administered 2021-01-17: 10 mL
  Filled 2021-01-17: qty 10

## 2021-01-17 MED ORDER — SODIUM CHLORIDE 0.9 % IV SOLN
Freq: Once | INTRAVENOUS | Status: AC
  Filled 2021-01-17: qty 250

## 2021-01-17 MED ORDER — SODIUM CHLORIDE 0.9 % IV SOLN
10.0000 mg | Freq: Once | INTRAVENOUS | Status: AC
  Administered 2021-01-17: 10 mg via INTRAVENOUS
  Filled 2021-01-17: qty 10

## 2021-01-17 MED ORDER — HEPARIN SOD (PORK) LOCK FLUSH 100 UNIT/ML IV SOLN
500.0000 [IU] | Freq: Once | INTRAVENOUS | Status: AC | PRN
  Administered 2021-01-17: 500 [IU]
  Filled 2021-01-17: qty 5

## 2021-01-17 MED ORDER — HYDROMORPHONE HCL 1 MG/ML IJ SOLN
INTRAMUSCULAR | Status: AC
  Filled 2021-01-17: qty 1

## 2021-01-17 MED ORDER — HYDROMORPHONE HCL 1 MG/ML IJ SOLN
1.0000 mg | Freq: Once | INTRAMUSCULAR | Status: AC
  Administered 2021-01-17: 1 mg via INTRAVENOUS

## 2021-01-17 MED ORDER — PALONOSETRON HCL INJECTION 0.25 MG/5ML
INTRAVENOUS | Status: AC
  Filled 2021-01-17: qty 5

## 2021-01-17 MED ORDER — FAMOTIDINE IN NACL 20-0.9 MG/50ML-% IV SOLN
INTRAVENOUS | Status: AC
  Filled 2021-01-17: qty 50

## 2021-01-17 NOTE — Telephone Encounter (Signed)
Yvette Roberts called and asked if it is ok to take her regular medications this morning and also if she could take a pain pill (dilaudid) before she comes in for her appointments today.  She said her port site is sore. Advised her that she can take her medications like normal and she can also take a pain pill.

## 2021-01-17 NOTE — Assessment & Plan Note (Signed)
She will begin chemotherapy today We went through side effects of therapy I checked her port incision site and they look fine I plan to see her weekly for supportive care I am concerned she might need thoracentesis in the near future

## 2021-01-17 NOTE — Assessment & Plan Note (Signed)
Her pain control is better with Dilaudid We discussed narcotic refill policy

## 2021-01-17 NOTE — Patient Instructions (Signed)
[facility] Discharge Instructions for Patients Receiving Chemotherapy  Today you received the following chemotherapy agents: Taxol, Carboplatin  To help prevent nausea and vomiting after your treatment, we encourage you to take your nausea medication as directed.Carboplatin injection What is this medicine? CARBOPLATIN (KAR boe pla tin) is a chemotherapy drug. It targets fast dividing cells, like cancer cells, and causes these cells to die. This medicine is used to treat ovarian cancer and many other cancers. This medicine may be used for other purposes; ask your health care provider or pharmacist if you have questions. COMMON BRAND NAME(S): Paraplatin What should I tell my health care provider before I take this medicine? They need to know if you have any of these conditions:  blood disorders  hearing problems  kidney disease  recent or ongoing radiation therapy  an unusual or allergic reaction to carboplatin, cisplatin, other chemotherapy, other medicines, foods, dyes, or preservatives  pregnant or trying to get pregnant  breast-feeding How should I use this medicine? This drug is usually given as an infusion into a vein. It is administered in a hospital or clinic by a specially trained health care professional. Talk to your pediatrician regarding the use of this medicine in children. Special care may be needed. Overdosage: If you think you have taken too much of this medicine contact a poison control center or emergency room at once. NOTE: This medicine is only for you. Do not share this medicine with others. What if I miss a dose? It is important not to miss a dose. Call your doctor or health care professional if you are unable to keep an appointment. What may interact with this medicine?  medicines for seizures  medicines to increase blood counts like filgrastim, pegfilgrastim, sargramostim  some antibiotics like amikacin, gentamicin, neomycin, streptomycin,  tobramycin  vaccines Talk to your doctor or health care professional before taking any of these medicines:  acetaminophen  aspirin  ibuprofen  ketoprofen  naproxen This list may not describe all possible interactions. Give your health care provider a list of all the medicines, herbs, non-prescription drugs, or dietary supplements you use. Also tell them if you smoke, drink alcohol, or use illegal drugs. Some items may interact with your medicine. What should I watch for while using this medicine? Your condition will be monitored carefully while you are receiving this medicine. You will need important blood work done while you are taking this medicine. This drug may make you feel generally unwell. This is not uncommon, as chemotherapy can affect healthy cells as well as cancer cells. Report any side effects. Continue your course of treatment even though you feel ill unless your doctor tells you to stop. In some cases, you may be given additional medicines to help with side effects. Follow all directions for their use. Call your doctor or health care professional for advice if you get a fever, chills or sore throat, or other symptoms of a cold or flu. Do not treat yourself. This drug decreases your body's ability to fight infections. Try to avoid being around people who are sick. This medicine may increase your risk to bruise or bleed. Call your doctor or health care professional if you notice any unusual bleeding. Be careful brushing and flossing your teeth or using a toothpick because you may get an infection or bleed more easily. If you have any dental work done, tell your dentist you are receiving this medicine. Avoid taking products that contain aspirin, acetaminophen, ibuprofen, naproxen, or ketoprofen  unless instructed by your doctor. These medicines may hide a fever. Do not become pregnant while taking this medicine. Women should inform their doctor if they wish to become pregnant or  think they might be pregnant. There is a potential for serious side effects to an unborn child. Talk to your health care professional or pharmacist for more information. Do not breast-feed an infant while taking this medicine. What side effects may I notice from receiving this medicine? Side effects that you should report to your doctor or health care professional as soon as possible:  allergic reactions like skin rash, itching or hives, swelling of the face, lips, or tongue  signs of infection - fever or chills, cough, sore throat, pain or difficulty passing urine  signs of decreased platelets or bleeding - bruising, pinpoint red spots on the skin, black, tarry stools, nosebleeds  signs of decreased red blood cells - unusually weak or tired, fainting spells, lightheadedness  breathing problems  changes in hearing  changes in vision  chest pain  high blood pressure  low blood counts - This drug may decrease the number of white blood cells, red blood cells and platelets. You may be at increased risk for infections and bleeding.  nausea and vomiting  pain, swelling, redness or irritation at the injection site  pain, tingling, numbness in the hands or feet  problems with balance, talking, walking  trouble passing urine or change in the amount of urine Side effects that usually do not require medical attention (report to your doctor or health care professional if they continue or are bothersome):  hair loss  loss of appetite  metallic taste in the mouth or changes in taste This list may not describe all possible side effects. Call your doctor for medical advice about side effects. You may report side effects to FDA at 1-800-FDA-1088. Where should I keep my medicine? This drug is given in a hospital or clinic and will not be stored at home. NOTE: This sheet is a summary. It may not cover all possible information. If you have questions about this medicine, talk to your doctor,  pharmacist, or health care provider.  2021 [employer]/Gold Standard (2008-01-19 14:38:05) Paclitaxel injection What is this medicine? PACLITAXEL (PAK li TAX el) is a chemotherapy drug. It targets fast dividing cells, like cancer cells, and causes these cells to die. This medicine is used to treat ovarian cancer, breast cancer, lung cancer, Kaposi's sarcoma, and other cancers. This medicine may be used for other purposes; ask your health care provider or pharmacist if you have questions. COMMON BRAND NAME(S): Onxol, Taxol What should I tell my health care provider before I take this medicine? They need to know if you have any of these conditions:  history of irregular heartbeat  liver disease  low blood counts, like low white cell, platelet, or red cell counts  lung or breathing disease, like asthma  tingling of the fingers or toes, or other nerve disorder  an unusual or allergic reaction to paclitaxel, alcohol, polyoxyethylated castor oil, other chemotherapy, other medicines, foods, dyes, or preservatives  pregnant or trying to get pregnant  breast-feeding How should I use this medicine? This drug is given as an infusion into a vein. It is administered in a hospital or clinic by a specially trained health care professional. Talk to your pediatrician regarding the use of this medicine in children. Special care may be needed. Overdosage: If you think you have taken too much of this medicine contact a poison  control center or emergency room at once. NOTE: This medicine is only for you. Do not share this medicine with others. What if I miss a dose? It is important not to miss your dose. Call your doctor or health care professional if you are unable to keep an appointment. What may interact with this medicine? Do not take this medicine with any of the following medications:  live virus vaccines This medicine may also interact with the following medications:  antiviral medicines for  hepatitis, HIV or AIDS  certain antibiotics like erythromycin and clarithromycin  certain medicines for fungal infections like ketoconazole and itraconazole  certain medicines for seizures like carbamazepine, phenobarbital, phenytoin  gemfibrozil  nefazodone  rifampin  St. John's wort This list may not describe all possible interactions. Give your health care provider a list of all the medicines, herbs, non-prescription drugs, or dietary supplements you use. Also tell them if you smoke, drink alcohol, or use illegal drugs. Some items may interact with your medicine. What should I watch for while using this medicine? Your condition will be monitored carefully while you are receiving this medicine. You will need important blood work done while you are taking this medicine. This medicine can cause serious allergic reactions. To reduce your risk you will need to take other medicine(s) before treatment with this medicine. If you experience allergic reactions like skin rash, itching or hives, swelling of the face, lips, or tongue, tell your doctor or health care professional right away. In some cases, you may be given additional medicines to help with side effects. Follow all directions for their use. This drug may make you feel generally unwell. This is not uncommon, as chemotherapy can affect healthy cells as well as cancer cells. Report any side effects. Continue your course of treatment even though you feel ill unless your doctor tells you to stop. Call your doctor or health care professional for advice if you get a fever, chills or sore throat, or other symptoms of a cold or flu. Do not treat yourself. This drug decreases your body's ability to fight infections. Try to avoid being around people who are sick. This medicine may increase your risk to bruise or bleed. Call your doctor or health care professional if you notice any unusual bleeding. Be careful brushing and flossing your teeth or  using a toothpick because you may get an infection or bleed more easily. If you have any dental work done, tell your dentist you are receiving this medicine. Avoid taking products that contain aspirin, acetaminophen, ibuprofen, naproxen, or ketoprofen unless instructed by your doctor. These medicines may hide a fever. Do not become pregnant while taking this medicine. Women should inform their doctor if they wish to become pregnant or think they might be pregnant. There is a potential for serious side effects to an unborn child. Talk to your health care professional or pharmacist for more information. Do not breast-feed an infant while taking this medicine. Men are advised not to father a child while receiving this medicine. This product may contain alcohol. Ask your pharmacist or healthcare provider if this medicine contains alcohol. Be sure to tell all healthcare providers you are taking this medicine. Certain medicines, like metronidazole and disulfiram, can cause an unpleasant reaction when taken with alcohol. The reaction includes flushing, headache, nausea, vomiting, sweating, and increased thirst. The reaction can last from 30 minutes to several hours. What side effects may I notice from receiving this medicine? Side effects that you should report to your  doctor or health care professional as soon as possible:  allergic reactions like skin rash, itching or hives, swelling of the face, lips, or tongue  breathing problems  changes in vision  fast, irregular heartbeat  high or low blood pressure  mouth sores  pain, tingling, numbness in the hands or feet  signs of decreased platelets or bleeding - bruising, pinpoint red spots on the skin, black, tarry stools, blood in the urine  signs of decreased red blood cells - unusually weak or tired, feeling faint or lightheaded, falls  signs of infection - fever or chills, cough, sore throat, pain or difficulty passing urine  signs and symptoms  of liver injury like dark yellow or brown urine; general ill feeling or flu-like symptoms; light-colored stools; loss of appetite; nausea; right upper belly pain; unusually weak or tired; yellowing of the eyes or skin  swelling of the ankles, feet, hands  unusually slow heartbeat Side effects that usually do not require medical attention (report to your doctor or health care professional if they continue or are bothersome):  diarrhea  hair loss  loss of appetite  muscle or joint pain  nausea, vomiting  pain, redness, or irritation at site where injected  tiredness This list may not describe all possible side effects. Call your doctor for medical advice about side effects. You may report side effects to FDA at 1-800-FDA-1088. Where should I keep my medicine? This drug is given in a hospital or clinic and will not be stored at home. NOTE: This sheet is a summary. It may not cover all possible information. If you have questions about this medicine, talk to your doctor, pharmacist, or health care provider.  2021 [employer]/Gold Standard (2019-09-15 13:37:23)    If you develop nausea and vomiting that is not controlled by your nausea medication, call the clinic.   BELOW ARE SYMPTOMS THAT SHOULD BE REPORTED IMMEDIATELY:  *FEVER GREATER THAN 100.5 F  *CHILLS WITH OR WITHOUT FEVER  NAUSEA AND VOMITING THAT IS NOT CONTROLLED WITH YOUR NAUSEA MEDICATION  *UNUSUAL SHORTNESS OF BREATH  *UNUSUAL BRUISING OR BLEEDING  TENDERNESS IN MOUTH AND THROAT WITH OR WITHOUT PRESENCE OF ULCERS  *URINARY PROBLEMS  *BOWEL PROBLEMS  UNUSUAL RASH Items with * indicate a potential emergency and should be followed up as soon as possible.  Feel free to call the clinic should you have any questions or concerns. The clinic phone number is [phone].  Please show the [facility] at check-in to the Emergency Department and triage nurse.

## 2021-01-17 NOTE — Progress Notes (Signed)
[facility] OFFICE PROGRESS NOTE  Patient Care Team: [name], FNP as PCP - General (Family Medicine) Awanda Mink Craige Cotta, RN as Oncology Nurse Navigator (Oncology)  ASSESSMENT & PLAN:  Uterine cancer [facility]) She will begin chemotherapy today We went through side effects of therapy I checked her port incision site and they look fine I plan to see her weekly for supportive care I am concerned she might need thoracentesis in the near future  Malignant pleural effusion She is oxygen dependent On exam, she has moderate amount of pleural effusion on the right side She does not feel she needs thoracentesis today I will see her next week for further assessment  Cancer associated pain Her pain control is better with Dilaudid We discussed narcotic refill policy   No orders of the defined types were placed in this encounter.   All questions were answered. The patient knows to call the clinic with any problems, questions or concerns. The total time spent in the appointment was 20 minutes encounter with patients including review of chart and various tests results, discussions about plan of care and coordination of care plan   [name], MD 01/17/2021 11:58 AM  INTERVAL HISTORY: Please see below for problem oriented charting. She returns with her son for further follow-up Her pain control has improved She has shortness of breath on moderate exertion No recent cough  SUMMARY OF ONCOLOGIC HISTORY: Oncology History Overview Note  Adenocarcinoma with component of squamous differentiation and serous component  HER2 negative IHC MMR intact   Uterine cancer ([place])  11/23/2020 Pathology Results   FINAL MICROSCOPIC DIAGNOSIS:  - Malignant cells consistent with non-small cell carcinoma  - See comment   SPECIMEN ADEQUACY:  Satisfactory for evaluation   DIAGNOSTIC COMMENTS:  Morphologically, the malignant cells are suggestive of an adenocarcinoma.  However,  immunohistochemical stains show that the tumor cells are positive for CK7 with patchy labeling for p63 and CK 5/6 whereas they are negative for TTF-1, Napsin-A, CK20, calretinin, D2-40, GATA3, ER and CDX2.  Ki-67 stain shows increased proliferative index. Positive staining for CK5/6 and p63 is suggestive of elements of squamous differentiation.   11/23/2020 Imaging   Ct chest 1. Near complete evacuation of the right pleural effusion after right-sided pigtail drainage catheter placement. Trace residual effusion is partially loculated. 2. Dense consolidation throughout the right lung, greatest at the right lung base. Findings are consistent with a combination of pneumonia and atelectasis. Follow-up imaging after completion of appropriate medical management is recommended to exclude underlying neoplasm. 3. Indeterminate hypodensity right lobe liver. If further evaluation is desired, dedicated liver CT or MRI may be useful.   12/08/2020 PET scan   1. Unusual pattern of hypermetabolic metastatic disease. 2. Intense metabolic activity associated with pleural thickening in the RIGHT hemithorax consistent with pleural metastasis versus primary pleural malignancy. 3. Isolated LEFT internal mammary nodal metastasis. 4. Intensely hypermetabolic lesion within the liver beneath the hypermetabolic lesions within the diaphragm. Differential includes local extension of pleural metastasis, hematogenous liver metastasis versus hepatic abscess. Favor hematogenous liver metastasis.  5. Hypermetabolic retroperitoneal metastatic adenopathy and internal iliac adenopathy. 6. Hypermetabolic thickening within the LEFT rectus muscle consistent with muscular skeletal metastasis. 7. Intense metabolic activity within enlarged uterus. Recommend gyn evaluation and potential MRI of the uterus to evaluate for primary uterine carcinoma. Unusual site of metastasis. Benign leiomyoma can be hypermetabolic but typically not this intense. 8.  Rim of hypermetabolic activity in the vagina towards the introitus. Recommend evaluation as  above.   12/20/2020 Initial Diagnosis   Uterine cancer ([place])   12/26/2020 Pathology Results   A. ENDOCERVIX, CURETTAGE:  - Scant benign squamous epithelium.   B. ENDOMETRIUM, BIOPSY:  - Adenocarcinoma, see comment.   COMMENT:   B. There are malignant glands some which have marked cytologic atypia. There if focal squamous differentiation. The tumor is positive for cytokeratin 7, PAX-8, GATA-3, ER (10% strong), p53, and WT-1. The cells are negative for cytokeratin 20, CDX-2, TTF-1, and NapsinA. The overall findings are consistent with a gynecologic adenocarcinoma. While squamous differentiation is most commonly seen in endometrioid/endocervical adenocarcinoma, the strong p53 staining and focal marked atypia is suggestive of a serous component.    01/03/2021 Cancer Staging   Staging form: Corpus Uteri - Carcinoma and Carcinosarcoma, AJCC 8th Edition - Clinical: FIGO Stage IVB (cT3b, cN2a, pM1) - Signed by [name], MD on 01/03/2021   01/16/2021 Procedure   Successful placement of a right internal jugular approach power injectable Port-A-Cath. The catheter is ready for immediate use.   01/17/2021 -  Chemotherapy    Patient is on Treatment Plan: UTERINE CARBOPLATIN AUC 6 / PACLITAXEL Q21D      Metastasis to liver ([place])  01/03/2021 Initial Diagnosis   Metastasis to liver ([place])   01/17/2021 -  Chemotherapy    Patient is on Treatment Plan: UTERINE CARBOPLATIN AUC 6 / PACLITAXEL Q21D        REVIEW OF SYSTEMS:   Constitutional: Denies fevers, chills or abnormal weight loss Eyes: Denies blurriness of vision Ears, nose, mouth, throat, and face: Denies mucositis or sore throat Cardiovascular: Denies palpitation, chest discomfort or lower extremity swelling Gastrointestinal:  Denies nausea, heartburn or change in bowel habits Skin: Denies abnormal skin rashes Lymphatics: Denies new lymphadenopathy or  easy bruising Neurological:Denies numbness, tingling or new weaknesses Behavioral/Psych: Mood is stable, no new changes  All other systems were reviewed with the patient and are negative.  I have reviewed the past medical history, past surgical history, social history and family history with the patient and they are unchanged from previous note.  ALLERGIES:  is allergic to eggs or egg-derived products, influenza vaccines, peanut-containing drug products, penicillins, sulfa antibiotics, and codeine.  MEDICATIONS:  Current Outpatient Medications  Medication Sig Dispense Refill  . amLODipine (NORVASC) 5 MG tablet Take 5 mg by mouth daily.    Marland Kitchen aspirin 81 MG tablet Take 81 mg by mouth daily.    . Cholecalciferol (VITAMIN D) 125 MCG (5000 UT) CAPS Take 5,000 Units by mouth daily.    Marland Kitchen dexamethasone (DECADRON) 4 MG tablet Take 2 tabs at the night before and 2 tab the morning of chemotherapy, every 3 weeks, by mouth x 6 cycles 36 tablet 6  . famotidine (PEPCID) 20 MG tablet Take 1 tablet (20 mg total) by mouth 2 (two) times daily as needed for heartburn or indigestion. 30 tablet 0  . FLUoxetine (PROZAC) 40 MG capsule TAKE 1 CAPSULE(40 MG) BY MOUTH DAILY (Patient taking differently: Take 40 mg by mouth daily.) 15 capsule 0  . HYDROmorphone (DILAUDID) 2 MG tablet Take 1 tablet (2 mg total) by mouth every 4 (four) hours as needed for severe pain. 30 tablet 0  . lidocaine (LIDODERM) 5 % Place 1 patch onto the skin daily. Remove & Discard patch within 12 hours or as directed by MD 30 patch 0  . lidocaine (LIDODERM) 5 % Place 1 patch onto the skin daily. Remove & Discard patch within 12 hours. 30 patch 0  .  lidocaine-prilocaine (EMLA) cream Apply to affected area once 30 g 3  . MELATONIN PO Take 10 mg by mouth at bedtime.    Marland Kitchen omeprazole (PRILOSEC) 40 MG capsule TAKE 1 CAPSULE(40 MG) BY MOUTH DAILY (Patient taking differently: Take 40 mg by mouth daily.) 30 capsule 0  . ondansetron (ZOFRAN) 8 MG tablet  Take 1 tablet (8 mg total) by mouth every 8 (eight) hours as needed. Start on the third day after chemotherapy. 30 tablet 1  . OXYGEN Inhale 2 L/min into the lungs continuous.    . prochlorperazine (COMPAZINE) 10 MG tablet Take 1 tablet (10 mg total) by mouth every 6 (six) hours as needed (Nausea or vomiting). 30 tablet 1  . saccharomyces boulardii (FLORASTOR) 250 MG capsule Take 1 capsule (250 mg total) by mouth 2 (two) times daily. 30 capsule 0  . traZODone (DESYREL) 50 MG tablet Take 1 tablet (50 mg total) by mouth at bedtime as needed for sleep. 30 tablet 0   No current facility-administered medications for this visit.    PHYSICAL EXAMINATION: ECOG PERFORMANCE STATUS: 2 - Symptomatic, <50% confined to bed  Vitals:   01/17/21 1142  BP: (!) 158/84  Pulse: (!) 104  Resp: 18  Temp: 98.3 F (36.8 C)  SpO2: 91%   Filed Weights   01/17/21 1142  Weight: 215 lb 12.8 oz (97.9 kg)    GENERAL:alert, no distress and comfortable SKIN: The port site area looks okay EYES: normal, Conjunctiva are pink and non-injected, sclera clear OROPHARYNX:no exudate, no erythema and lips, buccal mucosa, and tongue normal  NECK: supple, thyroid normal size, non-tender, without nodularity LYMPH:  no palpable lymphadenopathy in the cervical, axillary or inguinal LUNGS: She has reduced breath sound on most part of her right lung HEART: regular rate & rhythm and no murmurs and no lower extremity edema ABDOMEN:abdomen soft, non-tender and normal bowel sounds Musculoskeletal:no cyanosis of digits and no clubbing  NEURO: alert & oriented x 3 with fluent speech, no focal motor/sensory deficits  LABORATORY DATA:  I have reviewed the data as listed    Component Value Date/Time   NA 138 01/16/2021 1320   K 3.3 (L) 01/16/2021 1320   CL 93 (L) 01/16/2021 1320   CO2 27 01/16/2021 1320   GLUCOSE 99 01/16/2021 1320   BUN 9 01/16/2021 1320   CREATININE 0.69 01/16/2021 1320   CREATININE 0.64 12/20/2020 1346    CALCIUM 9.8 01/16/2021 1320   PROT 7.4 01/16/2021 1320   ALBUMIN 3.5 01/16/2021 1320   AST 28 01/16/2021 1320   AST 25 12/20/2020 1346   ALT 15 01/16/2021 1320   ALT 19 12/20/2020 1346   ALKPHOS 69 01/16/2021 1320   BILITOT 1.3 (H) 01/16/2021 1320   BILITOT 0.6 12/20/2020 1346   GFRNONAA >60 01/16/2021 1320   GFRNONAA >60 12/20/2020 1346    No results found for: SPEP, UPEP  Lab Results  Component Value Date   WBC 6.5 01/16/2021   NEUTROABS 4.5 01/16/2021   HGB 12.9 01/16/2021   HCT 39.3 01/16/2021   MCV 83.4 01/16/2021   PLT 337 01/16/2021      Chemistry      Component Value Date/Time   NA 138 01/16/2021 1320   K 3.3 (L) 01/16/2021 1320   CL 93 (L) 01/16/2021 1320   CO2 27 01/16/2021 1320   BUN 9 01/16/2021 1320   CREATININE 0.69 01/16/2021 1320   CREATININE 0.64 12/20/2020 1346      Component Value Date/Time   CALCIUM 9.8  01/16/2021 1320   ALKPHOS 69 01/16/2021 1320   AST 28 01/16/2021 1320   AST 25 12/20/2020 1346   ALT 15 01/16/2021 1320   ALT 19 12/20/2020 1346   BILITOT 1.3 (H) 01/16/2021 1320   BILITOT 0.6 12/20/2020 1346       RADIOGRAPHIC STUDIES: I have personally reviewed the radiological images as listed and agreed with the findings in the report. DG Chest 2 View  Result Date: 01/08/2021 CLINICAL DATA:  History of malignant pleural effusion. EXAM: CHEST - 2 VIEW COMPARISON:  November 28, 2020 FINDINGS: Persistent hazy opacification of the right lung is noted. Mild left basilar atelectasis is noted. There is right-sided pleural thickening with a small right pleural effusion. This is increased in size when compared to the prior study. A small left pleural effusion is seen. The heart size and mediastinal contours are within normal limits. Both lungs are clear. Degenerative changes seen throughout the thoracic spine. IMPRESSION: 1. Persistent moderate severity infiltrate throughout the right lung. 2. Mild left basilar atelectasis. 3. Right-sided pleural  thickening with bilateral pleural effusions. Electronically Signed   By: Virgina Norfolk M.D.   On: 01/08/2021 15:32   IR IMAGING GUIDED PORT INSERTION  Result Date: 01/16/2021 INDICATION: Non-small cell lung cancer EXAM: IMPLANTED PORT A CATH PLACEMENT WITH ULTRASOUND AND FLUOROSCOPIC GUIDANCE MEDICATIONS: None ANESTHESIA/SEDATION: Moderate (conscious) sedation was employed during this procedure. A total of Versed 4 mg and Fentanyl 100 mcg was administered intravenously. Moderate Sedation Time: 19 minutes. The patient's level of consciousness and vital signs were monitored continuously by radiology nursing throughout the procedure under my direct supervision. FLUOROSCOPY TIME:  0 minutes, 30 seconds (10 mGy) COMPLICATIONS: None immediate. PROCEDURE: The procedure, risks, benefits, and alternatives were explained to the patient. Questions regarding the procedure were encouraged and answered. The patient understands and consents to the procedure. A timeout was performed prior to the initiation of the procedure. Patient positioned supine on the angiography table. Right neck and anterior upper chest prepped and draped in the usual sterile fashion. All elements of maximal sterile barrier were utilized including, cap, mask, sterile gown, sterile gloves, large sterile drape, hand scrubbing and 2% Chlorhexidine for skin cleaning. The right internal jugular vein was evaluated with ultrasound and shown to be patent. A permanent ultrasound image was obtained and placed in the patient's medical record. Local anesthesia was provided with 1% lidocaine with epinephrine. Using sterile gel and a sterile probe cover, the right internal jugular vein was entered with a 21 ga needle during real time ultrasound guidance. 0.018 inch guidewire placed and 21 ga needle exchanged for transitional dilator set. Utilizing fluoroscopy, 0.035 inch guidewire advanced through the needle without difficulty. Attention then turned to the right  anterior upper chest. Following local lidocaine administration, a port pocket was created. The catheter was connected to the port and brought from the pocket to the venotomy site through a subcutaneous tunnel. The catheter was cut to size and inserted through the peel-away sheath. The catheter tip was positioned at the cavoatrial junction using fluoroscopic guidance. The port aspirated and flushed well. The port pocket was closed with deep and superficial absorbable suture. The port pocket incision and venotomy sites were also sealed with Dermabond. IMPRESSION: Successful placement of a right internal jugular approach power injectable Port-A-Cath. The catheter is ready for immediate use. Electronically Signed   By: Miachel Roux M.D.   On: 01/16/2021 15:56

## 2021-01-17 NOTE — Assessment & Plan Note (Signed)
She is oxygen dependent On exam, she has moderate amount of pleural effusion on the right side She does not feel she needs thoracentesis today I will see her next week for further assessment

## 2021-01-18 ENCOUNTER — Telehealth: Payer: Self-pay | Admitting: Oncology

## 2021-01-18 NOTE — Telephone Encounter (Signed)
Yvette Roberts called and said her port site looks swollen but she can't really tell because there is a bandage over it.  She also said the derma bond on her neck incision is peeling.    Advised her that she can remove the bandage over her port and that she can put a bandaid over the neck incision if it is being irritated by her clothing. Do not remove the derma bound and to let it fall off by itself. Also discussed that she can use ice if the area is swollen.  She is also going to try to send a picture through MyChart and will call in the morning to let us know how she is doing.

## 2021-01-19 ENCOUNTER — Telehealth: Payer: Self-pay | Admitting: Oncology

## 2021-01-19 NOTE — Telephone Encounter (Signed)
Called Breklyn to see how she is feeling today.  She said she has not had a bowel movement since before treatment on 01/17/2021.  She is taking Miralax once a day.  Advised her she can take Miralax twice a day and also can take Sen[place]ot up to 3 times a day as needed.  She is going to try taking Miralax BID today.    She also had not removed the dressing over her port yet.  She removed it while on the phone and said it was a little red where the tape was.  Advised her to keep it open to air and to be gentle when cleaning the area.  She verbalized understanding.

## 2021-01-23 ENCOUNTER — Inpatient Hospital Stay (HOSPITAL_BASED_OUTPATIENT_CLINIC_OR_DEPARTMENT_OTHER): Payer: Medicare Other | Admitting: Hematology and Oncology

## 2021-01-23 ENCOUNTER — Other Ambulatory Visit: Payer: Self-pay | Admitting: Hematology and Oncology

## 2021-01-23 ENCOUNTER — Encounter: Payer: Self-pay | Admitting: Hematology and Oncology

## 2021-01-23 ENCOUNTER — Other Ambulatory Visit: Payer: Self-pay

## 2021-01-23 DIAGNOSIS — G893 Neoplasm related pain (acute) (chronic): Secondary | ICD-10-CM | POA: Diagnosis not present

## 2021-01-23 DIAGNOSIS — Z5111 Encounter for antineoplastic chemotherapy: Secondary | ICD-10-CM | POA: Diagnosis not present

## 2021-01-23 DIAGNOSIS — C55 Malignant neoplasm of uterus, part unspecified: Secondary | ICD-10-CM

## 2021-01-23 DIAGNOSIS — J910 Malignant pleural effusion: Secondary | ICD-10-CM

## 2021-01-23 MED ORDER — HYDROMORPHONE HCL 4 MG PO TABS: 4.0000 mg | ORAL_TABLET | ORAL | 0 refills | Status: DC | PRN

## 2021-01-23 MED FILL — HYDROmorphone HCL 4 MG TABS: 4 | 10 days supply | Qty: 60 | Fill #0

## 2021-01-23 NOTE — Assessment & Plan Note (Signed)
She has referred pain to her shoulder due to her cancer Her pain is suboptimally controlled with 2 mg Dilaudid I recommend increasing Dilaudid to 4 mg and to take Tylenol along with Dilaudid as needed for pain control I warned her about side effects I will assess pain control next week

## 2021-01-23 NOTE — Assessment & Plan Note (Signed)
Her pleural effusion is less on exam Her oxygen saturation is better I am hopeful, if her examination remained satisfactory next week, we can start weaning her off oxygen therapy

## 2021-01-23 NOTE — Progress Notes (Signed)
[facility] OFFICE PROGRESS NOTE  Patient Care Team: [name], FNP as PCP - General (Family Medicine) Awanda Mink Craige Cotta, RN as Oncology Nurse Navigator (Oncology)  ASSESSMENT & PLAN:  Uterine cancer ([place]) Overall, she tolerated treatment well except for mild shoulder pain which I do not think is related to side effects of chemotherapy and some constipation which has since resolved Clinically, she appears to be responding to treatment I will see her again next week for further follow-up and supportive care  Cancer associated pain She has referred pain to her shoulder due to her cancer Her pain is suboptimally controlled with 2 mg Dilaudid I recommend increasing Dilaudid to 4 mg and to take Tylenol along with Dilaudid as needed for pain control I warned her about side effects I will assess pain control next week  Malignant pleural effusion Her pleural effusion is less on exam Her oxygen saturation is better I am hopeful, if her examination remained satisfactory next week, we can start weaning her off oxygen therapy   No orders of the defined types were placed in this encounter.   All questions were answered. The patient knows to call the clinic with any problems, questions or concerns. The total time spent in the appointment was 20 minutes encounter with patients including review of chart and various tests results, discussions about plan of care and coordination of care plan   [name], MD 01/23/2021 1:09 PM  INTERVAL HISTORY: Please see below for problem oriented charting. She returns for further follow-up She tolerated cycle 1 of treatment well except for some constipation which has resolved She is noticing some right shoulder pain The dose of the pain medicine is not enough to control her pain She is sleep poorly because of that She denies recent cough No recent nausea  SUMMARY OF ONCOLOGIC HISTORY: Oncology History Overview Note  Adenocarcinoma  with component of squamous differentiation and serous component  HER2 negative IHC MMR intact   Uterine cancer ([place])  11/23/2020 Pathology Results   FINAL MICROSCOPIC DIAGNOSIS:  - Malignant cells consistent with non-small cell carcinoma  - See comment   SPECIMEN ADEQUACY:  Satisfactory for evaluation   DIAGNOSTIC COMMENTS:  Morphologically, the malignant cells are suggestive of an adenocarcinoma.  However, immunohistochemical stains show that the tumor cells are positive for CK7 with patchy labeling for p63 and CK 5/6 whereas they are negative for TTF-1, Napsin-A, CK20, calretinin, D2-40, GATA3, ER and CDX2.  Ki-67 stain shows increased proliferative index. Positive staining for CK5/6 and p63 is suggestive of elements of squamous differentiation.   11/23/2020 Imaging   Ct chest 1. Near complete evacuation of the right pleural effusion after right-sided pigtail drainage catheter placement. Trace residual effusion is partially loculated. 2. Dense consolidation throughout the right lung, greatest at the right lung base. Findings are consistent with a combination of pneumonia and atelectasis. Follow-up imaging after completion of appropriate medical management is recommended to exclude underlying neoplasm. 3. Indeterminate hypodensity right lobe liver. If further evaluation is desired, dedicated liver CT or MRI may be useful.   12/08/2020 PET scan   1. Unusual pattern of hypermetabolic metastatic disease. 2. Intense metabolic activity associated with pleural thickening in the RIGHT hemithorax consistent with pleural metastasis versus primary pleural malignancy. 3. Isolated LEFT internal mammary nodal metastasis. 4. Intensely hypermetabolic lesion within the liver beneath the hypermetabolic lesions within the diaphragm. Differential includes local extension of pleural metastasis, hematogenous liver metastasis versus hepatic abscess. Favor hematogenous liver metastasis.  5. Hypermetabolic  retroperitoneal metastatic adenopathy and internal iliac adenopathy. 6. Hypermetabolic thickening within the LEFT rectus muscle consistent with muscular skeletal metastasis. 7. Intense metabolic activity within enlarged uterus. Recommend gyn evaluation and potential MRI of the uterus to evaluate for primary uterine carcinoma. Unusual site of metastasis. Benign leiomyoma can be hypermetabolic but typically not this intense. 8. Rim of hypermetabolic activity in the vagina towards the introitus. Recommend evaluation as above.   12/20/2020 Initial Diagnosis   Uterine cancer ([place])   12/26/2020 Pathology Results   A. ENDOCERVIX, CURETTAGE:  - Scant benign squamous epithelium.   B. ENDOMETRIUM, BIOPSY:  - Adenocarcinoma, see comment.   COMMENT:   B. There are malignant glands some which have marked cytologic atypia. There if focal squamous differentiation. The tumor is positive for cytokeratin 7, PAX-8, GATA-3, ER (10% strong), p53, and WT-1. The cells are negative for cytokeratin 20, CDX-2, TTF-1, and NapsinA. The overall findings are consistent with a gynecologic adenocarcinoma. While squamous differentiation is most commonly seen in endometrioid/endocervical adenocarcinoma, the strong p53 staining and focal marked atypia is suggestive of a serous component.    01/03/2021 Cancer Staging   Staging form: Corpus Uteri - Carcinoma and Carcinosarcoma, AJCC 8th Edition - Clinical: FIGO Stage IVB (cT3b, cN2a, pM1) - Signed by [name], MD on 01/03/2021   01/16/2021 Procedure   Successful placement of a right internal jugular approach power injectable Port-A-Cath. The catheter is ready for immediate use.   01/17/2021 -  Chemotherapy    Patient is on Treatment Plan: UTERINE CARBOPLATIN AUC 6 / PACLITAXEL Q21D      Metastasis to liver ([place])  01/03/2021 Initial Diagnosis   Metastasis to liver ([place])   01/17/2021 -  Chemotherapy    Patient is on Treatment Plan: UTERINE CARBOPLATIN AUC 6 / PACLITAXEL  Q21D        REVIEW OF SYSTEMS:   Constitutional: Denies fevers, chills or abnormal weight loss Eyes: Denies blurriness of vision Ears, nose, mouth, throat, and face: Denies mucositis or sore throat Respiratory: Denies cough, dyspnea or wheezes Cardiovascular: Denies palpitation, chest discomfort or lower extremity swelling Skin: Denies abnormal skin rashes Lymphatics: Denies new lymphadenopathy or easy bruising Neurological:Denies numbness, tingling or new weaknesses Behavioral/Psych: Mood is stable, no new changes  All other systems were reviewed with the patient and are negative.  I have reviewed the past medical history, past surgical history, social history and family history with the patient and they are unchanged from previous note.  ALLERGIES:  is allergic to eggs or egg-derived products, influenza vaccines, peanut-containing drug products, penicillins, sulfa antibiotics, and codeine.  MEDICATIONS:  Current Outpatient Medications  Medication Sig Dispense Refill  . amLODipine (NORVASC) 5 MG tablet Take 5 mg by mouth daily.    Marland Kitchen aspirin 81 MG tablet Take 81 mg by mouth daily.    . Cholecalciferol (VITAMIN D) 125 MCG (5000 UT) CAPS Take 5,000 Units by mouth daily.    Marland Kitchen dexamethasone (DECADRON) 4 MG tablet Take 2 tabs at the night before and 2 tab the morning of chemotherapy, every 3 weeks, by mouth x 6 cycles 36 tablet 6  . famotidine (PEPCID) 20 MG tablet Take 1 tablet (20 mg total) by mouth 2 (two) times daily as needed for heartburn or indigestion. 30 tablet 0  . FLUoxetine (PROZAC) 40 MG capsule TAKE 1 CAPSULE(40 MG) BY MOUTH DAILY (Patient taking differently: Take 40 mg by mouth daily.) 15 capsule 0  . HYDROmorphone (DILAUDID) 4 MG tablet Take 1 tablet (4  mg total) by mouth every 4 (four) hours as needed for severe pain. 60 tablet 0  . lidocaine (LIDODERM) 5 % Place 1 patch onto the skin daily. Remove & Discard patch within 12 hours or as directed by MD 30 patch 0  .  lidocaine (LIDODERM) 5 % Place 1 patch onto the skin daily. Remove & Discard patch within 12 hours. 30 patch 0  . lidocaine-prilocaine (EMLA) cream Apply to affected area once 30 g 3  . MELATONIN PO Take 10 mg by mouth at bedtime.    Marland Kitchen omeprazole (PRILOSEC) 40 MG capsule TAKE 1 CAPSULE(40 MG) BY MOUTH DAILY (Patient taking differently: Take 40 mg by mouth daily.) 30 capsule 0  . ondansetron (ZOFRAN) 8 MG tablet Take 1 tablet (8 mg total) by mouth every 8 (eight) hours as needed. Start on the third day after chemotherapy. 30 tablet 1  . OXYGEN Inhale 2 L/min into the lungs continuous.    . prochlorperazine (COMPAZINE) 10 MG tablet Take 1 tablet (10 mg total) by mouth every 6 (six) hours as needed (Nausea or vomiting). 30 tablet 1  . saccharomyces boulardii (FLORASTOR) 250 MG capsule Take 1 capsule (250 mg total) by mouth 2 (two) times daily. 30 capsule 0  . traZODone (DESYREL) 50 MG tablet Take 1 tablet (50 mg total) by mouth at bedtime as needed for sleep. 30 tablet 0   No current facility-administered medications for this visit.    PHYSICAL EXAMINATION: ECOG PERFORMANCE STATUS: 2 - Symptomatic, <50% confined to bed  Vitals:   01/23/21 1135  BP: 123/71  Pulse: 86  Resp: 18  Temp: 98.1 F (36.7 C)  SpO2: 98%   Filed Weights   01/23/21 1135  Weight: 214 lb 8 oz (97.3 kg)    GENERAL:alert, no distress and comfortable SKIN: skin color, texture, turgor are normal, no rashes or significant lesions EYES: normal, Conjunctiva are pink and non-injected, sclera clear OROPHARYNX:no exudate, no erythema and lips, buccal mucosa, and tongue normal  NECK: supple, thyroid normal size, non-tender, without nodularity LYMPH:  no palpable lymphadenopathy in the cervical, axillary or inguinal LUNGS: She has minimum breathing effort.  The right sided pleural effusion is improving. HEART: regular rate & rhythm and no murmurs and no lower extremity edema ABDOMEN:abdomen soft, non-tender and normal bowel  sounds Musculoskeletal:no cyanosis of digits and no clubbing  NEURO: alert & oriented x 3 with fluent speech, no focal motor/sensory deficits  LABORATORY DATA:  I have reviewed the data as listed    Component Value Date/Time   NA 138 01/16/2021 1320   K 3.3 (L) 01/16/2021 1320   CL 93 (L) 01/16/2021 1320   CO2 27 01/16/2021 1320   GLUCOSE 99 01/16/2021 1320   BUN 9 01/16/2021 1320   CREATININE 0.69 01/16/2021 1320   CREATININE 0.64 12/20/2020 1346   CALCIUM 9.8 01/16/2021 1320   PROT 7.4 01/16/2021 1320   ALBUMIN 3.5 01/16/2021 1320   AST 28 01/16/2021 1320   AST 25 12/20/2020 1346   ALT 15 01/16/2021 1320   ALT 19 12/20/2020 1346   ALKPHOS 69 01/16/2021 1320   BILITOT 1.3 (H) 01/16/2021 1320   BILITOT 0.6 12/20/2020 1346   GFRNONAA >60 01/16/2021 1320   GFRNONAA >60 12/20/2020 1346    No results found for: SPEP, UPEP  Lab Results  Component Value Date   WBC 6.5 01/16/2021   NEUTROABS 4.5 01/16/2021   HGB 12.9 01/16/2021   HCT 39.3 01/16/2021   MCV 83.4 01/16/2021  PLT 337 01/16/2021      Chemistry      Component Value Date/Time   NA 138 01/16/2021 1320   K 3.3 (L) 01/16/2021 1320   CL 93 (L) 01/16/2021 1320   CO2 27 01/16/2021 1320   BUN 9 01/16/2021 1320   CREATININE 0.69 01/16/2021 1320   CREATININE 0.64 12/20/2020 1346      Component Value Date/Time   CALCIUM 9.8 01/16/2021 1320   ALKPHOS 69 01/16/2021 1320   AST 28 01/16/2021 1320   AST 25 12/20/2020 1346   ALT 15 01/16/2021 1320   ALT 19 12/20/2020 1346   BILITOT 1.3 (H) 01/16/2021 1320   BILITOT 0.6 12/20/2020 1346       RADIOGRAPHIC STUDIES: I have personally reviewed the radiological images as listed and agreed with the findings in the report. DG Chest 2 View  Result Date: 01/08/2021 CLINICAL DATA:  History of malignant pleural effusion. EXAM: CHEST - 2 VIEW COMPARISON:  November 28, 2020 FINDINGS: Persistent hazy opacification of the right lung is noted. Mild left basilar atelectasis is  noted. There is right-sided pleural thickening with a small right pleural effusion. This is increased in size when compared to the prior study. A small left pleural effusion is seen. The heart size and mediastinal contours are within normal limits. Both lungs are clear. Degenerative changes seen throughout the thoracic spine. IMPRESSION: 1. Persistent moderate severity infiltrate throughout the right lung. 2. Mild left basilar atelectasis. 3. Right-sided pleural thickening with bilateral pleural effusions. Electronically Signed   By: Virgina Norfolk M.D.   On: 01/08/2021 15:32   IR IMAGING GUIDED PORT INSERTION  Result Date: 01/16/2021 INDICATION: Non-small cell lung cancer EXAM: IMPLANTED PORT A CATH PLACEMENT WITH ULTRASOUND AND FLUOROSCOPIC GUIDANCE MEDICATIONS: None ANESTHESIA/SEDATION: Moderate (conscious) sedation was employed during this procedure. A total of Versed 4 mg and Fentanyl 100 mcg was administered intravenously. Moderate Sedation Time: 19 minutes. The patient's level of consciousness and vital signs were monitored continuously by radiology nursing throughout the procedure under my direct supervision. FLUOROSCOPY TIME:  0 minutes, 30 seconds (10 mGy) COMPLICATIONS: None immediate. PROCEDURE: The procedure, risks, benefits, and alternatives were explained to the patient. Questions regarding the procedure were encouraged and answered. The patient understands and consents to the procedure. A timeout was performed prior to the initiation of the procedure. Patient positioned supine on the angiography table. Right neck and anterior upper chest prepped and draped in the usual sterile fashion. All elements of maximal sterile barrier were utilized including, cap, mask, sterile gown, sterile gloves, large sterile drape, hand scrubbing and 2% Chlorhexidine for skin cleaning. The right internal jugular vein was evaluated with ultrasound and shown to be patent. A permanent ultrasound image was obtained and  placed in the patient's medical record. Local anesthesia was provided with 1% lidocaine with epinephrine. Using sterile gel and a sterile probe cover, the right internal jugular vein was entered with a 21 ga needle during real time ultrasound guidance. 0.018 inch guidewire placed and 21 ga needle exchanged for transitional dilator set. Utilizing fluoroscopy, 0.035 inch guidewire advanced through the needle without difficulty. Attention then turned to the right anterior upper chest. Following local lidocaine administration, a port pocket was created. The catheter was connected to the port and brought from the pocket to the venotomy site through a subcutaneous tunnel. The catheter was cut to size and inserted through the peel-away sheath. The catheter tip was positioned at the cavoatrial junction using fluoroscopic guidance. The port aspirated and  flushed well. The port pocket was closed with deep and superficial absorbable suture. The port pocket incision and venotomy sites were also sealed with Dermabond. IMPRESSION: Successful placement of a right internal jugular approach power injectable Port-A-Cath. The catheter is ready for immediate use. Electronically Signed   By: Miachel Roux M.D.   On: 01/16/2021 15:56

## 2021-01-23 NOTE — Assessment & Plan Note (Signed)
Overall, she tolerated treatment well except for mild shoulder pain which I do not think is related to side effects of chemotherapy and some constipation which has since resolved Clinically, she appears to be responding to treatment I will see her again next week for further follow-up and supportive care

## 2021-01-24 ENCOUNTER — Telehealth: Payer: Self-pay | Admitting: Oncology

## 2021-01-24 ENCOUNTER — Encounter: Payer: Self-pay | Admitting: Licensed Clinical Social Worker

## 2021-01-24 NOTE — Telephone Encounter (Signed)
Yvette Roberts called and asked about financial assistance and if she could apply for Medicaid. Discussed the [employer] and she said she would be interested in applying for that.  Advised her that I will reach out to [place], [employer] and [place], [place] to see if they can assist her.

## 2021-01-24 NOTE — Progress Notes (Signed)
[facility] CSW Progress Note  Clinical Education officer, museum contacted patient by phone to follow up on assistance resources.  Reviewed information from last conversation about looking into social security retirement benefits as well as how to apply for [employer]. In addition, discussed [place] for [facility] for potential financial assistance as well as local resources such as [employer] and [employer].  Mailed hard copy of information discussed today to patient. No other needs at this time.    Christeen Douglas , LCSW

## 2021-01-26 ENCOUNTER — Encounter: Payer: Self-pay | Admitting: Hematology and Oncology

## 2021-01-26 NOTE — Progress Notes (Signed)
Spoke w/ pt regarding the [employer].  She would like to apply so she will bring her proof of income on 01/30/21.  If approved I will give her an expense sheet and my card for any questions or concerns she may have in the future.

## 2021-01-30 ENCOUNTER — Other Ambulatory Visit (HOSPITAL_COMMUNITY): Payer: Self-pay

## 2021-01-30 ENCOUNTER — Encounter: Payer: Self-pay | Admitting: Hematology and Oncology

## 2021-01-30 ENCOUNTER — Other Ambulatory Visit: Payer: Self-pay

## 2021-01-30 ENCOUNTER — Inpatient Hospital Stay: Payer: Medicare Other | Attending: Hematology and Oncology | Admitting: Hematology and Oncology

## 2021-01-30 DIAGNOSIS — K5909 Other constipation: Secondary | ICD-10-CM

## 2021-01-30 DIAGNOSIS — G893 Neoplasm related pain (acute) (chronic): Secondary | ICD-10-CM

## 2021-01-30 DIAGNOSIS — C55 Malignant neoplasm of uterus, part unspecified: Secondary | ICD-10-CM | POA: Diagnosis present

## 2021-01-30 DIAGNOSIS — R600 Localized edema: Secondary | ICD-10-CM

## 2021-01-30 DIAGNOSIS — R634 Abnormal weight loss: Secondary | ICD-10-CM

## 2021-01-30 DIAGNOSIS — Z5111 Encounter for antineoplastic chemotherapy: Secondary | ICD-10-CM | POA: Diagnosis not present

## 2021-01-30 DIAGNOSIS — J910 Malignant pleural effusion: Secondary | ICD-10-CM | POA: Diagnosis not present

## 2021-01-30 DIAGNOSIS — C787 Secondary malignant neoplasm of liver and intrahepatic bile duct: Secondary | ICD-10-CM | POA: Diagnosis not present

## 2021-01-30 MED ORDER — METHADONE HCL 10 MG PO TABS
20.0000 mg | ORAL_TABLET | Freq: Two times a day (BID) | ORAL | 0 refills | Status: DC
  Filled 2021-01-30: qty 60, 15d supply, fill #0

## 2021-01-30 NOTE — Assessment & Plan Note (Signed)
She has mild constipation from treatment but is taking laxatives on a regular basis I warned her about risk of increased constipation with introduction of long-acting pain medicine

## 2021-01-30 NOTE — Progress Notes (Signed)
[place] OFFICE PROGRESS NOTE  Patient Care Team: [name], FNP as PCP - General (Family Medicine) Awanda Mink Craige Cotta, RN as Oncology Nurse Navigator (Oncology)  ASSESSMENT & PLAN:  Uterine cancer [facility]) Clinically, she appears to be responding to treatment I suspect her weight loss is related to improved disease control and resolution of effusion fluid Her pain is suboptimally controlled and I will aggressively manage her pain Her respiratory failure is resolving I will see her again next week for further follow-up, prior to second cycle of treatment  Cancer associated pain Her pain is well controlled with increased dose Dilaudid but is not lasting long enough She would wake up in the middle of the night suffering in pain I suspect her suboptimal pain control also affect her mood and appetite I recommend introduction of long-acting pain medicine I recommend she start using methadone 20 mg twice a day and I plan to reassess pain control next week I am hopeful, with the use of long-acting pain medicine, she would need less breakthrough Dilaudid She is aware of risk of sedation and constipation with long-acting pain medicine She is instructed to call me if she runs into difficulties tolerating methadone She has my permission to reduce to 10 mg twice a day if she suffers tumor side effects from treatment  Malignant pleural effusion Her pleural effusion seems to be resolving Based on my exam today, she does not need therapeutic thoracentesis I recommend taper for prescription oxygen The patient is instructed to stop oxygen therapy during daytime and to monitor her oxygen saturation periodically throughout the day If she tolerated that well, we can start weaning her off oxygen next week completely  Other constipation She has mild constipation from treatment but is taking laxatives on a regular basis I warned her about risk of increased constipation with  introduction of long-acting pain medicine  Bilateral lower extremity edema She has moderate bilateral lower extremity edema, likely exacerbated by sedentary lifestyle and low albumin status Recommend the patient to elevate her legs at home as much as possible, reduce salt intake and to increase mobility as tolerated  Abnormal weight loss She has lost approximately 10 pounds compared to last visit She has malignant cachexia Recommend frequent small meals and high-protein intake as tolerated   No orders of the defined types were placed in this encounter.   All questions were answered. The patient knows to call the clinic with any problems, questions or concerns. The total time spent in the appointment was 30 minutes encounter with patients including review of chart and various tests results, discussions about plan of care and coordination of care plan   [name], MD 01/30/2021 1:25 PM  INTERVAL HISTORY: Please see below for problem oriented charting. She returns with caregiver for further follow-up She has lost some weight She does not have good appetite overall Her breathing has improved Her pain is better controlled with stronger dose of Dilaudid but she would wake up in the middle of the night with pain She has minimal constipation, alleviated by laxatives No recent nausea  SUMMARY OF ONCOLOGIC HISTORY: Oncology History Overview Note  Adenocarcinoma with component of squamous differentiation and serous component  HER2 negative IHC MMR intact   Uterine cancer ([place])  11/23/2020 Pathology Results   FINAL MICROSCOPIC DIAGNOSIS:  - Malignant cells consistent with non-small cell carcinoma  - See comment   SPECIMEN ADEQUACY:  Satisfactory for evaluation   DIAGNOSTIC COMMENTS:  Morphologically, the malignant cells are  suggestive of an adenocarcinoma.  However, immunohistochemical stains show that the tumor cells are positive for CK7 with patchy labeling for p63 and CK 5/6  whereas they are negative for TTF-1, Napsin-A, CK20, calretinin, D2-40, GATA3, ER and CDX2.  Ki-67 stain shows increased proliferative index. Positive staining for CK5/6 and p63 is suggestive of elements of squamous differentiation.   11/23/2020 Imaging   Ct chest 1. Near complete evacuation of the right pleural effusion after right-sided pigtail drainage catheter placement. Trace residual effusion is partially loculated. 2. Dense consolidation throughout the right lung, greatest at the right lung base. Findings are consistent with a combination of pneumonia and atelectasis. Follow-up imaging after completion of appropriate medical management is recommended to exclude underlying neoplasm. 3. Indeterminate hypodensity right lobe liver. If further evaluation is desired, dedicated liver CT or MRI may be useful.   12/08/2020 PET scan   1. Unusual pattern of hypermetabolic metastatic disease. 2. Intense metabolic activity associated with pleural thickening in the RIGHT hemithorax consistent with pleural metastasis versus primary pleural malignancy. 3. Isolated LEFT internal mammary nodal metastasis. 4. Intensely hypermetabolic lesion within the liver beneath the hypermetabolic lesions within the diaphragm. Differential includes local extension of pleural metastasis, hematogenous liver metastasis versus hepatic abscess. Favor hematogenous liver metastasis.  5. Hypermetabolic retroperitoneal metastatic adenopathy and internal iliac adenopathy. 6. Hypermetabolic thickening within the LEFT rectus muscle consistent with muscular skeletal metastasis. 7. Intense metabolic activity within enlarged uterus. Recommend gyn evaluation and potential MRI of the uterus to evaluate for primary uterine carcinoma. Unusual site of metastasis. Benign leiomyoma can be hypermetabolic but typically not this intense. 8. Rim of hypermetabolic activity in the vagina towards the introitus. Recommend evaluation as above.   12/20/2020  Initial Diagnosis   Uterine cancer ([place])   12/26/2020 Pathology Results   A. ENDOCERVIX, CURETTAGE:  - Scant benign squamous epithelium.   B. ENDOMETRIUM, BIOPSY:  - Adenocarcinoma, see comment.   COMMENT:   B. There are malignant glands some which have marked cytologic atypia. There if focal squamous differentiation. The tumor is positive for cytokeratin 7, PAX-8, GATA-3, ER (10% strong), p53, and WT-1. The cells are negative for cytokeratin 20, CDX-2, TTF-1, and NapsinA. The overall findings are consistent with a gynecologic adenocarcinoma. While squamous differentiation is most commonly seen in endometrioid/endocervical adenocarcinoma, the strong p53 staining and focal marked atypia is suggestive of a serous component.    01/03/2021 Cancer Staging   Staging form: Corpus Uteri - Carcinoma and Carcinosarcoma, AJCC 8th Edition - Clinical: FIGO Stage IVB (cT3b, cN2a, pM1) - Signed by [name], MD on 01/03/2021   01/16/2021 Procedure   Successful placement of a right internal jugular approach power injectable Port-A-Cath. The catheter is ready for immediate use.   01/17/2021 -  Chemotherapy    Patient is on Treatment Plan: UTERINE CARBOPLATIN AUC 6 / PACLITAXEL Q21D      Metastasis to liver ([place])  01/03/2021 Initial Diagnosis   Metastasis to liver ([place])   01/17/2021 -  Chemotherapy    Patient is on Treatment Plan: UTERINE CARBOPLATIN AUC 6 / PACLITAXEL Q21D        REVIEW OF SYSTEMS:   Constitutional: Denies fevers, chills  Eyes: Denies blurriness of vision Ears, nose, mouth, throat, and face: Denies mucositis or sore throat Respiratory: Denies cough, dyspnea or wheezes Cardiovascular: Denies palpitation, chest discomfort  Skin: Denies abnormal skin rashes Lymphatics: Denies new lymphadenopathy or easy bruising Neurological:Denies numbness, tingling or new weaknesses Behavioral/Psych: Mood is stable, no new changes  All  other systems were reviewed with the patient and are  negative.  I have reviewed the past medical history, past surgical history, social history and family history with the patient and they are unchanged from previous note.  ALLERGIES:  is allergic to eggs or egg-derived products, influenza vaccines, peanut-containing drug products, penicillins, sulfa antibiotics, and codeine.  MEDICATIONS:  Current Outpatient Medications  Medication Sig Dispense Refill  . methadone (DOLOPHINE) 10 MG tablet Take 2 tablets (20 mg total) by mouth every 12 (twelve) hours. 60 tablet 0  . amLODipine (NORVASC) 5 MG tablet Take 5 mg by mouth daily.    Marland Kitchen aspirin 81 MG tablet Take 81 mg by mouth daily.    . Cholecalciferol (VITAMIN D) 125 MCG (5000 UT) CAPS Take 5,000 Units by mouth daily.    Marland Kitchen dexamethasone (DECADRON) 4 MG tablet TAKE 2 TABLETS BY MOUTH NIGHT BEFORE AND 2 TABLETS IN THE MORNING OF CHEMO EVERY 3 WEEKS FOR 6 CYCLES 36 tablet 6  . FLUoxetine (PROZAC) 40 MG capsule TAKE 1 CAPSULE(40 MG) BY MOUTH DAILY (Patient taking differently: Take 40 mg by mouth daily.) 15 capsule 0  . HYDROmorphone (DILAUDID) 4 MG tablet TAKE 1 TABLET BY MOUTH EVERY 4 HOURS AS NEEDED FOR SEVERE PAIN. 60 tablet 0  . lidocaine (LIDODERM) 5 % Place 1 patch onto the skin daily. Remove & Discard patch within 12 hours or as directed by MD 30 patch 0  . lidocaine (LIDODERM) 5 % Place 1 patch onto the skin daily. Remove & Discard patch within 12 hours. 30 patch 0  . lidocaine-prilocaine (EMLA) cream APPLY TO AFFECTED AREA ONCE AS DIRECTED 30 g 3  . MELATONIN PO Take 10 mg by mouth at bedtime.    Marland Kitchen omeprazole (PRILOSEC) 40 MG capsule TAKE 1 CAPSULE(40 MG) BY MOUTH DAILY (Patient taking differently: Take 40 mg by mouth daily.) 30 capsule 0  . ondansetron (ZOFRAN) 8 MG tablet TAKE 1 TABLET BY MOUTH EVERY 8 HOURS AS NEEDED START ON THE THIRD DAY AFTER CHEMO 30 tablet 1  . OXYGEN Inhale 2 L/min into the lungs continuous.    . prochlorperazine (COMPAZINE) 10 MG tablet TAKE 1 TABLET BY MOUTH EVERY 6  HOURS AS NEEDED FOR NAUSEA OR VOMITING 30 tablet 1  . saccharomyces boulardii (FLORASTOR) 250 MG capsule Take 1 capsule (250 mg total) by mouth 2 (two) times daily. 30 capsule 0  . traZODone (DESYREL) 50 MG tablet TAKE 1 TABLET (50 MG TOTAL) BY MOUTH AT BEDTIME AS NEEDED FOR SLEEP. 30 tablet 0   No current facility-administered medications for this visit.    PHYSICAL EXAMINATION: ECOG PERFORMANCE STATUS: 2 - Symptomatic, <50% confined to bed  Vitals:   01/30/21 1159  BP: 125/65  Pulse: 84  Resp: 20  Temp: 97.7 F (36.5 C)  SpO2: 96%   Filed Weights   01/30/21 1159  Weight: 205 lb 11.2 oz (93.3 kg)    GENERAL:alert, no distress and comfortable SKIN: skin color, texture, turgor are normal, no rashes or significant lesions EYES: normal, Conjunctiva are pink and non-injected, sclera clear OROPHARYNX:no exudate, no erythema and lips, buccal mucosa, and tongue normal  NECK: supple, thyroid normal size, non-tender, without nodularity LYMPH:  no palpable lymphadenopathy in the cervical, axillary or inguinal LUNGS: clear to auscultation and percussion with normal breathing effort HEART: regular rate & rhythm and no murmurs with moderate bilateral lower extremity edema ABDOMEN:abdomen soft, non-tender and normal bowel sounds Musculoskeletal:no cyanosis of digits and no clubbing.  Her port site looks  okay.  I removed a bandage on the side of her neck but left glue over her port site intact NEURO: alert & oriented x 3 with fluent speech, no focal motor/sensory deficits  LABORATORY DATA:  I have reviewed the data as listed    Component Value Date/Time   NA 138 01/16/2021 1320   K 3.3 (L) 01/16/2021 1320   CL 93 (L) 01/16/2021 1320   CO2 27 01/16/2021 1320   GLUCOSE 99 01/16/2021 1320   BUN 9 01/16/2021 1320   CREATININE 0.69 01/16/2021 1320   CREATININE 0.64 12/20/2020 1346   CALCIUM 9.8 01/16/2021 1320   PROT 7.4 01/16/2021 1320   ALBUMIN 3.5 01/16/2021 1320   AST 28 01/16/2021  1320   AST 25 12/20/2020 1346   ALT 15 01/16/2021 1320   ALT 19 12/20/2020 1346   ALKPHOS 69 01/16/2021 1320   BILITOT 1.3 (H) 01/16/2021 1320   BILITOT 0.6 12/20/2020 1346   GFRNONAA >60 01/16/2021 1320   GFRNONAA >60 12/20/2020 1346    No results found for: SPEP, UPEP  Lab Results  Component Value Date   WBC 6.5 01/16/2021   NEUTROABS 4.5 01/16/2021   HGB 12.9 01/16/2021   HCT 39.3 01/16/2021   MCV 83.4 01/16/2021   PLT 337 01/16/2021      Chemistry      Component Value Date/Time   NA 138 01/16/2021 1320   K 3.3 (L) 01/16/2021 1320   CL 93 (L) 01/16/2021 1320   CO2 27 01/16/2021 1320   BUN 9 01/16/2021 1320   CREATININE 0.69 01/16/2021 1320   CREATININE 0.64 12/20/2020 1346      Component Value Date/Time   CALCIUM 9.8 01/16/2021 1320   ALKPHOS 69 01/16/2021 1320   AST 28 01/16/2021 1320   AST 25 12/20/2020 1346   ALT 15 01/16/2021 1320   ALT 19 12/20/2020 1346   BILITOT 1.3 (H) 01/16/2021 1320   BILITOT 0.6 12/20/2020 1346       RADIOGRAPHIC STUDIES: I have personally reviewed the radiological images as listed and agreed with the findings in the report. DG Chest 2 View  Result Date: 01/08/2021 CLINICAL DATA:  History of malignant pleural effusion. EXAM: CHEST - 2 VIEW COMPARISON:  November 28, 2020 FINDINGS: Persistent hazy opacification of the right lung is noted. Mild left basilar atelectasis is noted. There is right-sided pleural thickening with a small right pleural effusion. This is increased in size when compared to the prior study. A small left pleural effusion is seen. The heart size and mediastinal contours are within normal limits. Both lungs are clear. Degenerative changes seen throughout the thoracic spine. IMPRESSION: 1. Persistent moderate severity infiltrate throughout the right lung. 2. Mild left basilar atelectasis. 3. Right-sided pleural thickening with bilateral pleural effusions. Electronically Signed   By: Virgina Norfolk M.D.   On:  01/08/2021 15:32   IR IMAGING GUIDED PORT INSERTION  Result Date: 01/16/2021 INDICATION: Non-small cell lung cancer EXAM: IMPLANTED PORT A CATH PLACEMENT WITH ULTRASOUND AND FLUOROSCOPIC GUIDANCE MEDICATIONS: None ANESTHESIA/SEDATION: Moderate (conscious) sedation was employed during this procedure. A total of Versed 4 mg and Fentanyl 100 mcg was administered intravenously. Moderate Sedation Time: 19 minutes. The patient's level of consciousness and vital signs were monitored continuously by radiology nursing throughout the procedure under my direct supervision. FLUOROSCOPY TIME:  0 minutes, 30 seconds (10 mGy) COMPLICATIONS: None immediate. PROCEDURE: The procedure, risks, benefits, and alternatives were explained to the patient. Questions regarding the procedure were encouraged and answered. The  patient understands and consents to the procedure. A timeout was performed prior to the initiation of the procedure. Patient positioned supine on the angiography table. Right neck and anterior upper chest prepped and draped in the usual sterile fashion. All elements of maximal sterile barrier were utilized including, cap, mask, sterile gown, sterile gloves, large sterile drape, hand scrubbing and 2% Chlorhexidine for skin cleaning. The right internal jugular vein was evaluated with ultrasound and shown to be patent. A permanent ultrasound image was obtained and placed in the patient's medical record. Local anesthesia was provided with 1% lidocaine with epinephrine. Using sterile gel and a sterile probe cover, the right internal jugular vein was entered with a 21 ga needle during real time ultrasound guidance. 0.018 inch guidewire placed and 21 ga needle exchanged for transitional dilator set. Utilizing fluoroscopy, 0.035 inch guidewire advanced through the needle without difficulty. Attention then turned to the right anterior upper chest. Following local lidocaine administration, a port pocket was created. The catheter  was connected to the port and brought from the pocket to the venotomy site through a subcutaneous tunnel. The catheter was cut to size and inserted through the peel-away sheath. The catheter tip was positioned at the cavoatrial junction using fluoroscopic guidance. The port aspirated and flushed well. The port pocket was closed with deep and superficial absorbable suture. The port pocket incision and venotomy sites were also sealed with Dermabond. IMPRESSION: Successful placement of a right internal jugular approach power injectable Port-A-Cath. The catheter is ready for immediate use. Electronically Signed   By: Miachel Roux M.D.   On: 01/16/2021 15:56

## 2021-01-30 NOTE — Assessment & Plan Note (Signed)
Her pain is well controlled with increased dose Dilaudid but is not lasting long enough She would wake up in the middle of the night suffering in pain I suspect her suboptimal pain control also affect her mood and appetite I recommend introduction of long-acting pain medicine I recommend she start using methadone 20 mg twice a day and I plan to reassess pain control next week I am hopeful, with the use of long-acting pain medicine, she would need less breakthrough Dilaudid She is aware of risk of sedation and constipation with long-acting pain medicine She is instructed to call me if she runs into difficulties tolerating methadone She has my permission to reduce to 10 mg twice a day if she suffers tumor side effects from treatment

## 2021-01-30 NOTE — Assessment & Plan Note (Signed)
Her pleural effusion seems to be resolving Based on my exam today, she does not need therapeutic thoracentesis I recommend taper for prescription oxygen The patient is instructed to stop oxygen therapy during daytime and to monitor her oxygen saturation periodically throughout the day If she tolerated that well, we can start weaning her off oxygen next week completely

## 2021-01-30 NOTE — Assessment & Plan Note (Signed)
She has moderate bilateral lower extremity edema, likely exacerbated by sedentary lifestyle and low albumin status Recommend the patient to elevate her legs at home as much as possible, reduce salt intake and to increase mobility as tolerated

## 2021-01-30 NOTE — Assessment & Plan Note (Signed)
Clinically, she appears to be responding to treatment I suspect her weight loss is related to improved disease control and resolution of effusion fluid Her pain is suboptimally controlled and I will aggressively manage her pain Her respiratory failure is resolving I will see her again next week for further follow-up, prior to second cycle of treatment

## 2021-01-30 NOTE — Progress Notes (Signed)
Pt is approved for the $1000 [employer] grant.  

## 2021-01-30 NOTE — Assessment & Plan Note (Signed)
She has lost approximately 10 pounds compared to last visit She has malignant cachexia Recommend frequent small meals and high-protein intake as tolerated

## 2021-02-01 ENCOUNTER — Telehealth: Payer: Self-pay | Admitting: Oncology

## 2021-02-01 NOTE — Telephone Encounter (Signed)
Left a message for Yvette Roberts that it is ok to stop taking the methadone and go back to the dilaudid/tylenol and lidocaine patch.

## 2021-02-01 NOTE — Telephone Encounter (Signed)
OK 

## 2021-02-01 NOTE — Telephone Encounter (Signed)
Yvette Roberts called and said she started taking Methadone yesterday.  She took 1 tablet (10 mg) at 6 pm.  She woke up at 3 am sweating and short of breath.  This really scared her so she called her sister in law to come over.  She said she had the same reaction to taking oxycodone.  She is wondering if she can go back to taking dilaudid and using a lidocaine patch and alternating Tylenol. She has 29 of the dilaudid tablets left and 12 of the lidocaine patches.  She also mentioned that she has been throwing up a lot of phlegm this morning and has taken a Zofran.

## 2021-02-01 NOTE — Telephone Encounter (Signed)
Called back and spoke to Spring (sister in law) and advised her that Sua can go back to taking dilaudid and tylenol.  She verbalized agreement and will let Shaunte know.

## 2021-02-08 ENCOUNTER — Other Ambulatory Visit: Payer: Self-pay | Admitting: Hematology and Oncology

## 2021-02-08 ENCOUNTER — Inpatient Hospital Stay: Payer: Medicare Other

## 2021-02-08 ENCOUNTER — Other Ambulatory Visit: Payer: Self-pay

## 2021-02-08 ENCOUNTER — Encounter: Payer: Self-pay | Admitting: Hematology and Oncology

## 2021-02-08 ENCOUNTER — Ambulatory Visit (HOSPITAL_COMMUNITY)
Admission: RE | Admit: 2021-02-08 | Discharge: 2021-02-08 | Disposition: A | Discharge status: Home / Self Care | Payer: Medicare Other | Source: Ambulatory Visit | Attending: Hematology and Oncology | Admitting: Hematology and Oncology

## 2021-02-08 ENCOUNTER — Inpatient Hospital Stay (HOSPITAL_BASED_OUTPATIENT_CLINIC_OR_DEPARTMENT_OTHER): Payer: Medicare Other | Admitting: Hematology and Oncology

## 2021-02-08 ENCOUNTER — Other Ambulatory Visit (HOSPITAL_COMMUNITY): Payer: Self-pay

## 2021-02-08 VITALS — BP 136/69 | HR 79 | Temp 97.8°F | Resp 18 | Ht 66.0 in | Wt 199.4 lb

## 2021-02-08 DIAGNOSIS — C787 Secondary malignant neoplasm of liver and intrahepatic bile duct: Secondary | ICD-10-CM

## 2021-02-08 DIAGNOSIS — C55 Malignant neoplasm of uterus, part unspecified: Secondary | ICD-10-CM

## 2021-02-08 DIAGNOSIS — R634 Abnormal weight loss: Secondary | ICD-10-CM | POA: Diagnosis not present

## 2021-02-08 DIAGNOSIS — Z5111 Encounter for antineoplastic chemotherapy: Secondary | ICD-10-CM | POA: Diagnosis not present

## 2021-02-08 DIAGNOSIS — G893 Neoplasm related pain (acute) (chronic): Secondary | ICD-10-CM

## 2021-02-08 DIAGNOSIS — K5909 Other constipation: Secondary | ICD-10-CM

## 2021-02-08 DIAGNOSIS — J910 Malignant pleural effusion: Secondary | ICD-10-CM

## 2021-02-08 DIAGNOSIS — E876 Hypokalemia: Secondary | ICD-10-CM | POA: Insufficient documentation

## 2021-02-08 DIAGNOSIS — R600 Localized edema: Secondary | ICD-10-CM

## 2021-02-08 LAB — CBC WITH DIFFERENTIAL (CANCER CENTER ONLY)
Abs Immature Granulocytes: 0.13 10*3/uL — ABNORMAL HIGH (ref 0.00–0.07)
Basophils Absolute: 0 10*3/uL (ref 0.0–0.1)
Basophils Relative: 0 %
Eosinophils Absolute: 0 10*3/uL (ref 0.0–0.5)
Eosinophils Relative: 0 %
HCT: 35.3 % — ABNORMAL LOW (ref 36.0–46.0)
Hemoglobin: 11.8 g/dL — ABNORMAL LOW (ref 12.0–15.0)
Immature Granulocytes: 2 %
Lymphocytes Relative: 16 %
Lymphs Abs: 1 10*3/uL (ref 0.7–4.0)
MCH: 27.3 pg (ref 26.0–34.0)
MCHC: 33.4 g/dL (ref 30.0–36.0)
MCV: 81.7 fL (ref 80.0–100.0)
Monocytes Absolute: 0.1 10*3/uL (ref 0.1–1.0)
Monocytes Relative: 2 %
Neutro Abs: 5 10*3/uL (ref 1.7–7.7)
Neutrophils Relative %: 80 %
Platelet Count: 232 10*3/uL (ref 150–400)
RBC: 4.32 MIL/uL (ref 3.87–5.11)
RDW: 16.3 % — ABNORMAL HIGH (ref 11.5–15.5)
WBC Count: 6.2 10*3/uL (ref 4.0–10.5)
nRBC: 0 % (ref 0.0–0.2)

## 2021-02-08 LAB — CMP (CANCER CENTER ONLY)
ALT: 9 U/L (ref 0–44)
AST: 15 U/L (ref 15–41)
Albumin: 3.1 g/dL — ABNORMAL LOW (ref 3.5–5.0)
Alkaline Phosphatase: 92 U/L (ref 38–126)
Anion gap: 17 — ABNORMAL HIGH (ref 5–15)
BUN: 6 mg/dL — ABNORMAL LOW (ref 8–23)
CO2: 29 mmol/L (ref 22–32)
Calcium: 9.2 mg/dL (ref 8.9–10.3)
Chloride: 92 mmol/L — ABNORMAL LOW (ref 98–111)
Creatinine: 0.57 mg/dL (ref 0.44–1.00)
GFR, Estimated: >60 mL/min (ref >60)
Glucose, Bld: 160 mg/dL — ABNORMAL HIGH (ref 70–99)
Potassium: 3.2 mmol/L — ABNORMAL LOW (ref 3.5–5.1)
Sodium: 138 mmol/L (ref 135–145)
Total Bilirubin: 0.5 mg/dL (ref 0.3–1.2)
Total Protein: 7.1 g/dL (ref 6.5–8.1)

## 2021-02-08 MED ORDER — SODIUM CHLORIDE 0.9 % IV SOLN
Freq: Once | INTRAVENOUS | Status: AC
  Filled 2021-02-08: qty 250

## 2021-02-08 MED ORDER — HYDROMORPHONE HCL 4 MG PO TABS
ORAL_TABLET | ORAL | 0 refills | Status: DC | PRN
  Filled 2021-02-08: qty 60, 15d supply, fill #0

## 2021-02-08 MED ORDER — PALONOSETRON HCL INJECTION 0.25 MG/5ML
INTRAVENOUS | Status: AC
  Filled 2021-02-08: qty 5

## 2021-02-08 MED ORDER — HEPARIN SOD (PORK) LOCK FLUSH 100 UNIT/ML IV SOLN
500.0000 [IU] | Freq: Once | INTRAVENOUS | Status: AC | PRN
  Administered 2021-02-08: 500 [IU]
  Filled 2021-02-08: qty 5

## 2021-02-08 MED ORDER — HYDROMORPHONE HCL 1 MG/ML IJ SOLN
2.0000 mg | Freq: Once | INTRAMUSCULAR | Status: AC
  Administered 2021-02-08: 2 mg via INTRAVENOUS

## 2021-02-08 MED ORDER — SODIUM CHLORIDE 0.9% FLUSH
10.0000 mL | Freq: Once | INTRAVENOUS | Status: AC
  Administered 2021-02-08: 10 mL
  Filled 2021-02-08: qty 10

## 2021-02-08 MED ORDER — PALONOSETRON HCL INJECTION 0.25 MG/5ML
0.2500 mg | Freq: Once | INTRAVENOUS | Status: AC
  Administered 2021-02-08: 0.25 mg via INTRAVENOUS

## 2021-02-08 MED ORDER — DIPHENHYDRAMINE HCL 50 MG/ML IJ SOLN
INTRAMUSCULAR | Status: AC
  Filled 2021-02-08: qty 1

## 2021-02-08 MED ORDER — SODIUM CHLORIDE 0.9 % IV SOLN
140.0000 mg/m2 | Freq: Once | INTRAVENOUS | Status: AC
  Administered 2021-02-08: 288 mg via INTRAVENOUS
  Filled 2021-02-08: qty 48

## 2021-02-08 MED ORDER — SODIUM CHLORIDE 0.9% FLUSH
10.0000 mL | INTRAVENOUS | Status: DC | PRN
  Administered 2021-02-08: 10 mL
  Filled 2021-02-08: qty 10

## 2021-02-08 MED ORDER — SODIUM CHLORIDE 0.9 % IV SOLN
150.0000 mg | Freq: Once | INTRAVENOUS | Status: AC
  Administered 2021-02-08: 150 mg via INTRAVENOUS
  Filled 2021-02-08: qty 150

## 2021-02-08 MED ORDER — FAMOTIDINE IN NACL 20-0.9 MG/50ML-% IV SOLN
20.0000 mg | Freq: Once | INTRAVENOUS | Status: AC
Start: 2021-02-08 — End: 2021-02-08
  Administered 2021-02-08: 20 mg via INTRAVENOUS

## 2021-02-08 MED ORDER — SODIUM CHLORIDE 0.9 % IV SOLN
10.0000 mg | Freq: Once | INTRAVENOUS | Status: AC
  Administered 2021-02-08: 10 mg via INTRAVENOUS
  Filled 2021-02-08: qty 10

## 2021-02-08 MED ORDER — HYDROMORPHONE HCL 1 MG/ML IJ SOLN
INTRAMUSCULAR | Status: AC
  Filled 2021-02-08: qty 2

## 2021-02-08 MED ORDER — FAMOTIDINE IN NACL 20-0.9 MG/50ML-% IV SOLN
INTRAVENOUS | Status: AC
  Filled 2021-02-08: qty 50

## 2021-02-08 MED ORDER — SODIUM CHLORIDE 0.9 % IV SOLN
630.0000 mg | Freq: Once | INTRAVENOUS | Status: AC
  Administered 2021-02-08: 630 mg via INTRAVENOUS
  Filled 2021-02-08: qty 63

## 2021-02-08 MED ORDER — DIPHENHYDRAMINE HCL 50 MG/ML IJ SOLN
25.0000 mg | Freq: Once | INTRAMUSCULAR | Status: AC
Start: 2021-02-08 — End: 2021-02-08
  Administered 2021-02-08: 25 mg via INTRAVENOUS

## 2021-02-08 NOTE — Assessment & Plan Note (Signed)
This is some mild I recommend high potassium food intake

## 2021-02-08 NOTE — Assessment & Plan Note (Signed)
Repeat chest x-ray showed no recurrent pleural effusion Observe closely The patient is given permission to start weaning herself off oxygen

## 2021-02-08 NOTE — Progress Notes (Signed)
[place] OFFICE PROGRESS NOTE  Patient Care Team: [name], FNP as PCP - General (Family Medicine) Awanda Mink Craige Cotta, RN as Oncology Nurse Navigator (Oncology)  ASSESSMENT & PLAN:  Uterine cancer [facility], [place]) Clinically, I believe she is improving Chest x-ray today show no signs of recurrence of pleural effusion I recommend minimum 3 cycles of treatment before repeat imaging study Due to recent profound weight loss, I have to recalculate the dose of her chemotherapy She had significant health issues Plan to see her again next week for further follow-up and symptom management  Malignant pleural effusion Repeat chest x-ray showed no recurrent pleural effusion Observe closely The patient is given permission to start weaning herself off oxygen  Abnormal weight loss This is due to poor oral intake We discussed the importance of frequent small meals and high-protein intake  Cancer associated pain She did not tolerate methadone I refilled her prescription Dilaudid  Bilateral lower extremity edema She has moderate bilateral lower extremity edema, likely exacerbated by sedentary lifestyle and low albumin status Recommend the patient to elevate her legs at home as much as possible, reduce salt intake and to increase mobility as tolerated  Other constipation She is still somewhat reluctant to take regular laxatives I explained to the patient and her caregiver the importance of aggressive laxative therapy  Hypokalemia This is some mild I recommend high potassium food intake   Orders Placed This Encounter  Procedures  . DG Chest 2 View    Standing Status:   Future    Number of Occurrences:   1    Standing Expiration Date:   02/08/2022    Order Specific Question:   Reason for exam:    Answer:   hypoxia, recurrent effusion    Order Specific Question:   Preferred imaging location?    Answer:   [facility]    All questions were answered. The patient knows  to call the clinic with any problems, questions or concerns. The total time spent in the appointment was 40 minutes encounter with patients including review of chart and various tests results, discussions about plan of care and coordination of care plan   [name], MD 02/08/2021 10:24 AM  INTERVAL HISTORY: Please see below for problem oriented charting. She returns with her sister-in-law for further follow-up She has lost some weight Family noticed that she is not eating much She complains of leg swelling Family noted that her oxygen saturation at times were low She continues to battle with constipation intermittently She takes pain medicine as needed for her abdominal pain Denies recent nausea  SUMMARY OF ONCOLOGIC HISTORY: Oncology History Overview Note  Adenocarcinoma with component of squamous differentiation and serous component  HER2 negative IHC MMR intact   Uterine cancer ([place])  11/23/2020 Pathology Results   FINAL MICROSCOPIC DIAGNOSIS:  - Malignant cells consistent with non-small cell carcinoma  - See comment   SPECIMEN ADEQUACY:  Satisfactory for evaluation   DIAGNOSTIC COMMENTS:  Morphologically, the malignant cells are suggestive of an adenocarcinoma.  However, immunohistochemical stains show that the tumor cells are positive for CK7 with patchy labeling for p63 and CK 5/6 whereas they are negative for TTF-1, Napsin-A, CK20, calretinin, D2-40, GATA3, ER and CDX2.  Ki-67 stain shows increased proliferative index. Positive staining for CK5/6 and p63 is suggestive of elements of squamous differentiation.   11/23/2020 Imaging   Ct chest 1. Near complete evacuation of the right pleural effusion after right-sided pigtail drainage catheter placement. Trace  residual effusion is partially loculated. 2. Dense consolidation throughout the right lung, greatest at the right lung base. Findings are consistent with a combination of pneumonia and atelectasis. Follow-up imaging  after completion of appropriate medical management is recommended to exclude underlying neoplasm. 3. Indeterminate hypodensity right lobe liver. If further evaluation is desired, dedicated liver CT or MRI may be useful.   12/08/2020 PET scan   1. Unusual pattern of hypermetabolic metastatic disease. 2. Intense metabolic activity associated with pleural thickening in the RIGHT hemithorax consistent with pleural metastasis versus primary pleural malignancy. 3. Isolated LEFT internal mammary nodal metastasis. 4. Intensely hypermetabolic lesion within the liver beneath the hypermetabolic lesions within the diaphragm. Differential includes local extension of pleural metastasis, hematogenous liver metastasis versus hepatic abscess. Favor hematogenous liver metastasis.  5. Hypermetabolic retroperitoneal metastatic adenopathy and internal iliac adenopathy. 6. Hypermetabolic thickening within the LEFT rectus muscle consistent with muscular skeletal metastasis. 7. Intense metabolic activity within enlarged uterus. Recommend gyn evaluation and potential MRI of the uterus to evaluate for primary uterine carcinoma. Unusual site of metastasis. Benign leiomyoma can be hypermetabolic but typically not this intense. 8. Rim of hypermetabolic activity in the vagina towards the introitus. Recommend evaluation as above.   12/20/2020 Initial Diagnosis   Uterine cancer ([place])   12/26/2020 Pathology Results   A. ENDOCERVIX, CURETTAGE:  - Scant benign squamous epithelium.   B. ENDOMETRIUM, BIOPSY:  - Adenocarcinoma, see comment.   COMMENT:   B. There are malignant glands some which have marked cytologic atypia. There if focal squamous differentiation. The tumor is positive for cytokeratin 7, PAX-8, GATA-3, ER (10% strong), p53, and WT-1. The cells are negative for cytokeratin 20, CDX-2, TTF-1, and NapsinA. The overall findings are consistent with a gynecologic adenocarcinoma. While squamous differentiation is most  commonly seen in endometrioid/endocervical adenocarcinoma, the strong p53 staining and focal marked atypia is suggestive of a serous component.    01/03/2021 Cancer Staging   Staging form: Corpus Uteri - Carcinoma and Carcinosarcoma, AJCC 8th Edition - Clinical: FIGO Stage IVB (cT3b, cN2a, pM1) - Signed by [name], MD on 01/03/2021   01/16/2021 Procedure   Successful placement of a right internal jugular approach power injectable Port-A-Cath. The catheter is ready for immediate use.   01/17/2021 -  Chemotherapy    Patient is on Treatment Plan: UTERINE CARBOPLATIN AUC 6 / PACLITAXEL Q21D      Metastasis to liver ([place])  01/03/2021 Initial Diagnosis   Metastasis to liver ([place])   01/17/2021 -  Chemotherapy    Patient is on Treatment Plan: UTERINE CARBOPLATIN AUC 6 / PACLITAXEL Q21D        REVIEW OF SYSTEMS:   Constitutional: Denies fevers, chills  Eyes: Denies blurriness of vision Ears, nose, mouth, throat, and face: Denies mucositis or sore throat Respiratory: Denies cough, dyspnea or wheezes Cardiovascular: Denies palpitation, chest discomfort Skin: Denies abnormal skin rashes Lymphatics: Denies new lymphadenopathy or easy bruising Neurological:Denies numbness, tingling or new weaknesses Behavioral/Psych: Mood is stable, no new changes  All other systems were reviewed with the patient and are negative.  I have reviewed the past medical history, past surgical history, social history and family history with the patient and they are unchanged from previous note.  ALLERGIES:  is allergic to eggs or egg-derived products, influenza vaccines, peanut-containing drug products, penicillins, sulfa antibiotics, and codeine.  MEDICATIONS:  Current Outpatient Medications  Medication Sig Dispense Refill  . carvedilol (COREG) 12.5 MG tablet Take 12.5 mg by mouth 2 (two) times daily with  a meal.    . amLODipine (NORVASC) 5 MG tablet Take 5 mg by mouth daily.    Marland Kitchen aspirin 81 MG tablet Take 81 mg  by mouth daily.    . Cholecalciferol (VITAMIN D) 125 MCG (5000 UT) CAPS Take 5,000 Units by mouth daily.    Marland Kitchen dexamethasone (DECADRON) 4 MG tablet TAKE 2 TABLETS BY MOUTH NIGHT BEFORE AND 2 TABLETS IN THE MORNING OF CHEMO EVERY 3 WEEKS FOR 6 CYCLES 36 tablet 6  . FLUoxetine (PROZAC) 40 MG capsule TAKE 1 CAPSULE(40 MG) BY MOUTH DAILY (Patient taking differently: Take 40 mg by mouth daily.) 15 capsule 0  . HYDROmorphone (DILAUDID) 4 MG tablet TAKE 1 TABLET BY MOUTH EVERY 4 HOURS AS NEEDED FOR SEVERE PAIN. 60 tablet 0  . lidocaine (LIDODERM) 5 % Place 1 patch onto the skin daily. Remove & Discard patch within 12 hours. 30 patch 0  . lidocaine-prilocaine (EMLA) cream APPLY TO AFFECTED AREA ONCE AS DIRECTED 30 g 3  . MELATONIN PO Take 10 mg by mouth at bedtime.    Marland Kitchen omeprazole (PRILOSEC) 40 MG capsule TAKE 1 CAPSULE(40 MG) BY MOUTH DAILY (Patient taking differently: Take 40 mg by mouth daily.) 30 capsule 0  . ondansetron (ZOFRAN) 8 MG tablet TAKE 1 TABLET BY MOUTH EVERY 8 HOURS AS NEEDED START ON THE THIRD DAY AFTER CHEMO 30 tablet 1  . OXYGEN Inhale 2 L/min into the lungs continuous.    . prochlorperazine (COMPAZINE) 10 MG tablet TAKE 1 TABLET BY MOUTH EVERY 6 HOURS AS NEEDED FOR NAUSEA OR VOMITING 30 tablet 1  . traZODone (DESYREL) 50 MG tablet TAKE 1 TABLET (50 MG TOTAL) BY MOUTH AT BEDTIME AS NEEDED FOR SLEEP. 30 tablet 0   No current facility-administered medications for this visit.   Facility-Administered Medications Ordered in Other Visits  Medication Dose Route Frequency Provider Last Rate Last Admin  . CARBOplatin (PARAPLATIN) 630 mg in sodium chloride 0.9 % 250 mL chemo infusion  630 mg Intravenous Once Alvy Bimler, Nickolaos Brallier, MD      . dexamethasone (DECADRON) 10 mg in sodium chloride 0.9 % 50 mL IVPB  10 mg Intravenous Once Alvy Bimler, Yahsir Wickens, MD      . famotidine (PEPCID) IVPB 20 mg premix  20 mg Intravenous Once Alvy Bimler, Kairah Leoni, MD 200 mL/hr at 02/08/21 1015 20 mg at 02/08/21 1015  . fosaprepitant (EMEND)  150 mg in sodium chloride 0.9 % 145 mL IVPB  150 mg Intravenous Once Alvy Bimler, Zylan Almquist, MD      . heparin lock flush 100 unit/mL  500 Units Intracatheter Once PRN Alvy Bimler, Maryella Abood, MD      . PACLitaxel (TAXOL) 288 mg in sodium chloride 0.9 % 250 mL chemo infusion (> $RemoveBef'80mg'ynABkcYbUT$ /m2)  140 mg/m2 (Treatment Plan Recorded) Intravenous Once Romell Cavanah, MD      . sodium chloride flush (NS) 0.9 % injection 10 mL  10 mL Intracatheter PRN Alvy Bimler, Yalitza Teed, MD        PHYSICAL EXAMINATION: ECOG PERFORMANCE STATUS: 2 - Symptomatic, <50% confined to bed  Vitals:   02/08/21 0858  BP: 136/69  Pulse: 79  Resp: 18  Temp: 97.8 F (36.6 C)  SpO2: 100%   Filed Weights   02/08/21 0858  Weight: 199 lb 6.4 oz (90.4 kg)    GENERAL:alert, no distress and comfortable SKIN: skin color, texture, turgor are normal, no rashes or significant lesions EYES: normal, Conjunctiva are pink and non-injected, sclera clear OROPHARYNX:no exudate, no erythema and lips, buccal mucosa, and tongue normal  NECK: supple, thyroid normal size, non-tender, without nodularity LYMPH:  no palpable lymphadenopathy in the cervical, axillary or inguinal LUNGS: clear to auscultation and percussion with normal breathing effort HEART: regular rate & rhythm and no murmurs and with mild bilateral lower extremity edema ABDOMEN:abdomen soft, non-tender and normal bowel sounds Musculoskeletal:no cyanosis of digits and no clubbing  NEURO: alert & oriented x 3 with fluent speech, no focal motor/sensory deficits  LABORATORY DATA:  I have reviewed the data as listed    Component Value Date/Time   NA 138 02/08/2021 0831   K 3.2 (L) 02/08/2021 0831   CL 92 (L) 02/08/2021 0831   CO2 29 02/08/2021 0831   GLUCOSE 160 (H) 02/08/2021 0831   BUN 6 (L) 02/08/2021 0831   CREATININE 0.57 02/08/2021 0831   CALCIUM 9.2 02/08/2021 0831   PROT 7.1 02/08/2021 0831   ALBUMIN 3.1 (L) 02/08/2021 0831   AST 15 02/08/2021 0831   ALT 9 02/08/2021 0831   ALKPHOS 92 02/08/2021  0831   BILITOT 0.5 02/08/2021 0831   GFRNONAA >60 02/08/2021 0831    No results found for: SPEP, UPEP  Lab Results  Component Value Date   WBC 6.2 02/08/2021   NEUTROABS 5.0 02/08/2021   HGB 11.8 (L) 02/08/2021   HCT 35.3 (L) 02/08/2021   MCV 81.7 02/08/2021   PLT 232 02/08/2021      Chemistry      Component Value Date/Time   NA 138 02/08/2021 0831   K 3.2 (L) 02/08/2021 0831   CL 92 (L) 02/08/2021 0831   CO2 29 02/08/2021 0831   BUN 6 (L) 02/08/2021 0831   CREATININE 0.57 02/08/2021 0831      Component Value Date/Time   CALCIUM 9.2 02/08/2021 0831   ALKPHOS 92 02/08/2021 0831   AST 15 02/08/2021 0831   ALT 9 02/08/2021 0831   BILITOT 0.5 02/08/2021 0831       RADIOGRAPHIC STUDIES: I have personally reviewed the radiological images as listed and agreed with the findings in the report. DG Chest 2 View  Result Date: 02/08/2021 CLINICAL DATA:  Hypoxia, malignant pleural effusion. EXAM: CHEST - 2 VIEW COMPARISON:  January 08, 2021. FINDINGS: The heart size and mediastinal contours are within normal limits. Interval placement of right internal jugular Port-A-Cath with distal tip in expected position of cavoatrial junction. No pneumothorax is noted. Stable right-sided pleural thickening is noted. Grossly stable small bilateral pleural effusions are noted. Stable right lung opacity is noted concerning for infiltrate or possibly atelectasis. The visualized skeletal structures are unremarkable. IMPRESSION: Interval placement of right internal jugular Port-A-Cath with distal tip in expected position of cavoatrial junction. Stable small bilateral pleural effusions. Stable right-sided pleural thickening. Electronically Signed   By: Marijo Conception M.D.   On: 02/08/2021 09:54   IR IMAGING GUIDED PORT INSERTION  Result Date: 01/16/2021 INDICATION: Non-small cell lung cancer EXAM: IMPLANTED PORT A CATH PLACEMENT WITH ULTRASOUND AND FLUOROSCOPIC GUIDANCE MEDICATIONS: None  ANESTHESIA/SEDATION: Moderate (conscious) sedation was employed during this procedure. A total of Versed 4 mg and Fentanyl 100 mcg was administered intravenously. Moderate Sedation Time: 19 minutes. The patient's level of consciousness and vital signs were monitored continuously by radiology nursing throughout the procedure under my direct supervision. FLUOROSCOPY TIME:  0 minutes, 30 seconds (10 mGy) COMPLICATIONS: None immediate. PROCEDURE: The procedure, risks, benefits, and alternatives were explained to the patient. Questions regarding the procedure were encouraged and answered. The patient understands and consents to the procedure. A timeout was performed  prior to the initiation of the procedure. Patient positioned supine on the angiography table. Right neck and anterior upper chest prepped and draped in the usual sterile fashion. All elements of maximal sterile barrier were utilized including, cap, mask, sterile gown, sterile gloves, large sterile drape, hand scrubbing and 2% Chlorhexidine for skin cleaning. The right internal jugular vein was evaluated with ultrasound and shown to be patent. A permanent ultrasound image was obtained and placed in the patient's medical record. Local anesthesia was provided with 1% lidocaine with epinephrine. Using sterile gel and a sterile probe cover, the right internal jugular vein was entered with a 21 ga needle during real time ultrasound guidance. 0.018 inch guidewire placed and 21 ga needle exchanged for transitional dilator set. Utilizing fluoroscopy, 0.035 inch guidewire advanced through the needle without difficulty. Attention then turned to the right anterior upper chest. Following local lidocaine administration, a port pocket was created. The catheter was connected to the port and brought from the pocket to the venotomy site through a subcutaneous tunnel. The catheter was cut to size and inserted through the peel-away sheath. The catheter tip was positioned at the  cavoatrial junction using fluoroscopic guidance. The port aspirated and flushed well. The port pocket was closed with deep and superficial absorbable suture. The port pocket incision and venotomy sites were also sealed with Dermabond. IMPRESSION: Successful placement of a right internal jugular approach power injectable Port-A-Cath. The catheter is ready for immediate use. Electronically Signed   By: Miachel Roux M.D.   On: 01/16/2021 15:56

## 2021-02-08 NOTE — Assessment & Plan Note (Signed)
Clinically, I believe she is improving Chest x-ray today show no signs of recurrence of pleural effusion I recommend minimum 3 cycles of treatment before repeat imaging study Due to recent profound weight loss, I have to recalculate the dose of her chemotherapy She had significant health issues Plan to see her again next week for further follow-up and symptom management

## 2021-02-08 NOTE — Assessment & Plan Note (Signed)
This is due to poor oral intake We discussed the importance of frequent small meals and high-protein intake

## 2021-02-08 NOTE — Assessment & Plan Note (Signed)
She did not tolerate methadone I refilled her prescription Dilaudid

## 2021-02-08 NOTE — Patient Instructions (Addendum)
[facility] Discharge Instructions for Patients Receiving Chemotherapy  Today you received the following chemotherapy agents: Paclitaxel (Taxol) and Carboplatin  To help prevent nausea and vomiting after your treatment, we encourage you to take your nausea medication as directed by your MD.   If you develop nausea and vomiting that is not controlled by your nausea medication, call the clinic.   BELOW ARE SYMPTOMS THAT SHOULD BE REPORTED IMMEDIATELY:  *FEVER GREATER THAN 100.5 F  *CHILLS WITH OR WITHOUT FEVER  NAUSEA AND VOMITING THAT IS NOT CONTROLLED WITH YOUR NAUSEA MEDICATION  *UNUSUAL SHORTNESS OF BREATH  *UNUSUAL BRUISING OR BLEEDING  TENDERNESS IN MOUTH AND THROAT WITH OR WITHOUT PRESENCE OF ULCERS  *URINARY PROBLEMS  *BOWEL PROBLEMS  UNUSUAL RASH Items with * indicate a potential emergency and should be followed up as soon as possible.  Feel free to call the clinic should you have any questions or concerns. The clinic phone number is [phone].  Please show the [facility] at check-in to the Emergency Department and triage nurse.  Hydromorphone injection What is this medicine? HYDROMORPHONE (hye droe MOR fone) is a pain reliever. It is used to treat moderate to severe pain. This medicine may be used for other purposes; ask your health care provider or pharmacist if you have questions. COMMON BRAND NAME(S): Dilaudid, Dilaudid-HP, Simplist Dilaudid What should I tell my health care provider before I take this medicine? They need to know if you have any of these conditions:  brain tumor  drug abuse or addiction  head injury  heart disease  if you often drink alcohol  kidney disease  liver disease  lung or breathing disease, like asthma  problems urinating  seizures  stomach or intestine problems  an unusual or allergic reaction to hydromorphone, other medicines, foods, dyes, or preservatives  pregnant or trying to get  pregnant  breast-feeding How should I use this medicine? This medicine is for injection into a vein, into a muscle, or under the skin. It is usually given by a health care professional in a hospital or clinic setting. In rare cases, you might get this medicine at home. You will be taught how to give this medicine. Use exactly as directed. Take your medicine at regular intervals. Do not take your medicine more often than directed. It is important that you put your used needles and syringes in a special sharps container. Do not put them in a trash can. If you do not have a sharps container, call your pharmacist or healthcare provider to get one. Talk to your pediatrician regarding the use of this medicine in children. Special care may be needed. Overdosage: If you think you have taken too much of this medicine contact a poison control center or emergency room at once. NOTE: This medicine is only for you. Do not share this medicine with others. What if I miss a dose? If you miss a dose, use it as soon as you can. If it is almost time for your next dose, use only that dose. Do not use double or extra doses. What may interact with this medicine? This medicine may interact with the following medications:  alcohol  antihistamines for allergy, cough and cold  certain medicines for anxiety or sleep  certain medicines for depression like amitriptyline, fluoxetine, sertraline  certain medicines for seizures like phenobarbital, primidone  general anesthetics like halothane, isoflurane, methoxyflurane, propofol  local anesthetics like lidocaine, pramoxine, tetracaine  MAOIs like Carbex, Eldepryl, Marplan, Nardil, and  Parnate  medicines that relax muscles for surgery  other narcotic medicines for pain or cough  phenothiazines like chlorpromazine, mesoridazine, prochlorperazine, thioridazine This list may not describe all possible interactions. Give your health care provider a list of all the  medicines, herbs, non-prescription drugs, or dietary supplements you use. Also tell them if you smoke, drink alcohol, or use illegal drugs. Some items may interact with your medicine. What should I watch for while using this medicine? Tell your health care provider if your pain does not go away, if it gets worse, or if you have new or a different type of pain. You may develop tolerance to this drug. Tolerance means that you will need a higher dose of the drug for pain relief. Tolerance is normal and is expected if you take this drug for a long time. There are different types of narcotic drugs (opioids) for pain. If you take more than one type at the same time, you may have more side effects. Give your health care provider a list of all drugs you use. He or she will tell you how much drug to take. Do not take more drug than directed. Get emergency help right away if you have problems breathing. Do not suddenly stop taking your drug because you may develop a severe reaction. Your body becomes used to the drug. This does NOT mean you are addicted. Addiction is a behavior related to getting and using a drug for a nonmedical reason. If you have pain, you have a medical reason to take pain drug. Your health care provider will tell you how much drug to take. If your health care provider wants you to stop the drug, the dose will be slowly lowered over time to avoid any side effects. Talk to your health care provider about naloxone and how to get it. Naloxone is an emergency drug used for an opioid overdose. An overdose can happen if you take too much opioid. It can also happen if an opioid is taken with some other drugs or substances, like alcohol. Know the symptoms of an overdose, like trouble breathing, unusually tired or sleepy, or not being able to respond or wake up. Make sure to tell caregivers and close contacts where it is stored. Make sure they know how to use it. After naloxone is given, you must get  emergency help right away. Naloxone is a temporary treatment. Repeat doses may be needed. You may get drowsy or dizzy. Do not drive, use machinery, or do anything that needs mental alertness until you know how this drug affects you. Do not stand up or sit up quickly, especially if you are an older patient. This reduces the risk of dizzy or fainting spells. Alcohol may interfere with the effect of this drug. Avoid alcoholic drinks. This drug will cause constipation. If you do not have a bowel movement for 3 days, call your health care provider. Your mouth may get dry. Chewing sugarless gum or sucking hard candy and drinking plenty of water may help. Contact your health care provider if the problem does not go away or is severe. What side effects may I notice from receiving this medicine? Side effects that you should report to your doctor or health care professional as soon as possible:  allergic reactions like skin rash, itching or hives, swelling of the face, lips, or tongue  breathing problems  confusion  seizures  signs and symptoms of low blood pressure like dizziness; feeling faint or lightheaded, falls; unusually weak  or tired  trouble passing urine or change in the amount of urine Side effects that usually do not require medical attention (report to your doctor or health care professional if they continue or are bothersome):  constipation  dry mouth  nausea, vomiting  tiredness This list may not describe all possible side effects. Call your doctor for medical advice about side effects. You may report side effects to FDA at 1-800-FDA-1088. Where should I keep my medicine? Keep out of the reach of children. This medicine can be abused. Keep your medicine in a safe place to protect it from theft. Do not share this medicine with anyone. Selling or giving away this medicine is dangerous and against the law. If you are using this medicine at home, you will be instructed on how to store  this medicine. This medicine may cause accidental overdose and death if it is taken by other adults, children, or pets. Flush any unused medicine down the toilet to reduce the chance of harm. Do not use the medicine after the expiration date. NOTE: This sheet is a summary. It may not cover all possible information. If you have questions about this medicine, talk to your doctor, pharmacist, or health care provider.  2021 Elsevier/Gold Standard (2019-05-24 11:27:33)

## 2021-02-08 NOTE — Progress Notes (Signed)
Pt. received Dilaudid 2mg  IV for abdominal pain per orders. Pt. alert and oriented x 3, but seems slightly confused, hallucinations, thinks people are talking to her when they are talking to others.  Dr. Alvy Bimler notified and no new orders received. Notified Son [name] and instructed him to notify us for any further issues.

## 2021-02-08 NOTE — Assessment & Plan Note (Signed)
She has moderate bilateral lower extremity edema, likely exacerbated by sedentary lifestyle and low albumin status Recommend the patient to elevate her legs at home as much as possible, reduce salt intake and to increase mobility as tolerated

## 2021-02-08 NOTE — Assessment & Plan Note (Signed)
She is still somewhat reluctant to take regular laxatives I explained to the patient and her caregiver the importance of aggressive laxative therapy

## 2021-02-09 ENCOUNTER — Telehealth: Payer: Self-pay

## 2021-02-09 NOTE — Telephone Encounter (Signed)
[name] called and left a message to call her about [place].  Called back. She wanted to clarify how to take the Compazine and Zofran Rx. Take Compazine now if she is having nausea. Start taking Zofran on Sunday, 3 days after treatment if needed for nausea. Adrien does not like to take the compazine because it makes her head feel funny, denies nausea at this time. Instructed to call Monday with update. [name] verbalized understanding.

## 2021-02-14 ENCOUNTER — Inpatient Hospital Stay (HOSPITAL_BASED_OUTPATIENT_CLINIC_OR_DEPARTMENT_OTHER): Payer: Medicare Other | Admitting: Hematology and Oncology

## 2021-02-14 ENCOUNTER — Encounter: Payer: Self-pay | Admitting: Hematology and Oncology

## 2021-02-14 ENCOUNTER — Other Ambulatory Visit: Payer: Self-pay

## 2021-02-14 DIAGNOSIS — K5909 Other constipation: Secondary | ICD-10-CM

## 2021-02-14 DIAGNOSIS — G893 Neoplasm related pain (acute) (chronic): Secondary | ICD-10-CM | POA: Diagnosis not present

## 2021-02-14 DIAGNOSIS — R634 Abnormal weight loss: Secondary | ICD-10-CM | POA: Diagnosis not present

## 2021-02-14 DIAGNOSIS — J910 Malignant pleural effusion: Secondary | ICD-10-CM

## 2021-02-14 DIAGNOSIS — Z5111 Encounter for antineoplastic chemotherapy: Secondary | ICD-10-CM | POA: Diagnosis not present

## 2021-02-14 DIAGNOSIS — C55 Malignant neoplasm of uterus, part unspecified: Secondary | ICD-10-CM

## 2021-02-14 NOTE — Assessment & Plan Note (Signed)
This is due to poor oral intake We discussed the importance of frequent small meals and high-protein intake

## 2021-02-14 NOTE — Assessment & Plan Note (Signed)
Her pain is better controlled now We discussed narcotic refill policy

## 2021-02-14 NOTE — Assessment & Plan Note (Addendum)
She can stop using laxative now that she is having normal bowel movement

## 2021-02-14 NOTE — Assessment & Plan Note (Signed)
Clinically, I believe she is improving Chest x-ray today show no signs of recurrence of pleural effusion I recommend minimum 3 cycles of treatment before repeat imaging study

## 2021-02-14 NOTE — Assessment & Plan Note (Addendum)
Her repeat chest x-ray showed no recurrent pleural effusion Observe closely She has stopped using oxygen for the last 3 days

## 2021-02-14 NOTE — Progress Notes (Signed)
[facility] OFF[facility] PROGRESS NOTE  Patient Care Team: [name], FNP as PCP - General (Family Medicine) Awanda Mink Craige Cotta, RN as Oncology Nurse Navigator (Oncology)  ASSESSMENT & PLAN:  Uterine cancer [facility]) Clinically, I believe she is improving Chest x-ray today show no signs of recurrence of pleural effusion I recommend minimum 3 cycles of treatment before repeat imaging study   Malignant pleural effusion Her repeat chest x-ray showed no recurrent pleural effusion Observe closely She has stopped using oxygen for the last 3 days  Abnormal weight loss This is due to poor oral intake We discussed the importance of frequent small meals and high-protein intake  Cancer associated pain Her pain is better controlled now We discussed narcotic refill policy  Other constipation She can stop using laxative now that she is having normal bowel movement   No orders of the defined types were placed in this encounter.   All questions were answered. The patient knows to call the clinic with any problems, questions or concerns. The total time spent in the appointment was 20 minutes encounter with patients including review of chart and various tests results, discussions about plan of care and coordination of care plan   [name], MD 02/14/2021 10:47 AM  INTERVAL HISTORY: Please see below for problem oriented charting. She is doing better since her last cycle of treatment She got herself off oxygen the last 3 days She has no nausea constipation She continues to lose weight due to poor oral intake On average, she eats only 1-2 meals per day Leg swelling has resolved  SUMMARY OF ONCOLOGIC HISTORY: Oncology History Overview Note  Adenocarcinoma with component of squamous differentiation and serous component  HER2 negative IHC MMR intact   Uterine cancer ([place])  11/23/2020 Pathology Results   FINAL MICROSCOPIC DIAGNOSIS:  - Malignant cells consistent with  non-small cell carcinoma  - See comment   SPECIMEN ADEQUACY:  Satisfactory for evaluation   DIAGNOSTIC COMMENTS:  Morphologically, the malignant cells are suggestive of an adenocarcinoma.  However, immunohistochemical stains show that the tumor cells are positive for CK7 with patchy labeling for p63 and CK 5/6 whereas they are negative for TTF-1, Napsin-A, CK20, calretinin, D2-40, GATA3, ER and CDX2.  Ki-67 stain shows increased proliferative index. Positive staining for CK5/6 and p63 is suggestive of elements of squamous differentiation.   11/23/2020 Imaging   Ct chest 1. Near complete evacuation of the right pleural effusion after right-sided pigtail drainage catheter placement. Trace residual effusion is partially loculated. 2. Dense consolidation throughout the right lung, greatest at the right lung base. Findings are consistent with a combination of pneumonia and atelectasis. Follow-up imaging after completion of appropriate medical management is recommended to exclude underlying neoplasm. 3. Indeterminate hypodensity right lobe liver. If further evaluation is desired, dedicated liver CT or MRI may be useful.   12/08/2020 PET scan   1. Unusual pattern of hypermetabolic metastatic disease. 2. Intense metabolic activity associated with pleural thickening in the RIGHT hemithorax consistent with pleural metastasis versus primary pleural malignancy. 3. Isolated LEFT internal mammary nodal metastasis. 4. Intensely hypermetabolic lesion within the liver beneath the hypermetabolic lesions within the diaphragm. Differential includes local extension of pleural metastasis, hematogenous liver metastasis versus hepatic abscess. Favor hematogenous liver metastasis.  5. Hypermetabolic retroperitoneal metastatic adenopathy and internal iliac adenopathy. 6. Hypermetabolic thickening within the LEFT rectus muscle consistent with muscular skeletal metastasis. 7. Intense metabolic activity within enlarged  uterus. Recommend gyn evaluation and potential MRI of the uterus  to evaluate for primary uterine carcinoma. Unusual site of metastasis. Benign leiomyoma can be hypermetabolic but typically not this intense. 8. Rim of hypermetabolic activity in the vagina towards the introitus. Recommend evaluation as above.   12/20/2020 Initial Diagnosis   Uterine cancer ([place])   12/26/2020 Pathology Results   A. ENDOCERVIX, CURETTAGE:  - Scant benign squamous epithelium.   B. ENDOMETRIUM, BIOPSY:  - Adenocarcinoma, see comment.   COMMENT:   B. There are malignant glands some which have marked cytologic atypia. There if focal squamous differentiation. The tumor is positive for cytokeratin 7, PAX-8, GATA-3, ER (10% strong), p53, and WT-1. The cells are negative for cytokeratin 20, CDX-2, TTF-1, and NapsinA. The overall findings are consistent with a gynecologic adenocarcinoma. While squamous differentiation is most commonly seen in endometrioid/endocervical adenocarcinoma, the strong p53 staining and focal marked atypia is suggestive of a serous component.    01/03/2021 Cancer Staging   Staging form: Corpus Uteri - Carcinoma and Carcinosarcoma, AJCC 8th Edition - Clinical: FIGO Stage IVB (cT3b, cN2a, pM1) - Signed by [name], MD on 01/03/2021   01/16/2021 Procedure   Successful placement of a right internal jugular approach power injectable Port-A-Cath. The catheter is ready for immediate use.   01/17/2021 -  Chemotherapy    Patient is on Treatment Plan: UTERINE CARBOPLATIN AUC 6 / PACLITAXEL Q21D      Metastasis to liver ([place])  01/03/2021 Initial Diagnosis   Metastasis to liver ([place])   01/17/2021 -  Chemotherapy    Patient is on Treatment Plan: UTERINE CARBOPLATIN AUC 6 / PACLITAXEL Q21D        REVIEW OF SYSTEMS:   Constitutional: Denies fevers, chills Eyes: Denies blurriness of vision Ears, nose, mouth, throat, and face: Denies mucositis or sore throat Respiratory: Denies cough, dyspnea or  wheezes Cardiovascular: Denies palpitation, chest discomfort or lower extremity swelling Gastrointestinal:  Denies nausea, heartburn or change in bowel habits Skin: Denies abnormal skin rashes Lymphatics: Denies new lymphadenopathy or easy bruising Neurological:Denies numbness, tingling or new weaknesses Behavioral/Psych: Mood is stable, no new changes  All other systems were reviewed with the patient and are negative.  I have reviewed the past medical history, past surgical history, social history and family history with the patient and they are unchanged from previous note.  ALLERGIES:  is allergic to eggs or egg-derived products, influenza vaccines, peanut-containing drug products, penicillins, sulfa antibiotics, and codeine.  MEDICATIONS:  Current Outpatient Medications  Medication Sig Dispense Refill  . amLODipine (NORVASC) 5 MG tablet Take 5 mg by mouth daily.    Marland Kitchen aspirin 81 MG tablet Take 81 mg by mouth daily.    . carvedilol (COREG) 12.5 MG tablet Take 12.5 mg by mouth 2 (two) times daily with a meal.    . Cholecalciferol (VITAMIN D) 125 MCG (5000 UT) CAPS Take 5,000 Units by mouth daily.    Marland Kitchen dexamethasone (DECADRON) 4 MG tablet TAKE 2 TABLETS BY MOUTH NIGHT BEFORE AND 2 TABLETS IN THE MORNING OF CHEMO EVERY 3 WEEKS FOR 6 CYCLES 36 tablet 6  . FLUoxetine (PROZAC) 40 MG capsule TAKE 1 CAPSULE(40 MG) BY MOUTH DAILY (Patient taking differently: Take 40 mg by mouth daily.) 15 capsule 0  . HYDROmorphone (DILAUDID) 4 MG tablet TAKE 1 TABLET BY MOUTH EVERY 4 HOURS AS NEEDED FOR SEVERE PAIN. 60 tablet 0  . lidocaine (LIDODERM) 5 % Place 1 patch onto the skin daily. Remove & Discard patch within 12 hours. 30 patch 0  . lidocaine-prilocaine (EMLA) cream APPLY  TO AFFECTED AREA ONCE AS DIRECTED 30 g 3  . MELATONIN PO Take 10 mg by mouth at bedtime.    Marland Kitchen omeprazole (PRILOSEC) 40 MG capsule TAKE 1 CAPSULE(40 MG) BY MOUTH DAILY (Patient taking differently: Take 40 mg by mouth daily.) 30 capsule  0  . ondansetron (ZOFRAN) 8 MG tablet TAKE 1 TABLET BY MOUTH EVERY 8 HOURS AS NEEDED START ON THE THIRD DAY AFTER CHEMO 30 tablet 1  . OXYGEN Inhale 2 L/min into the lungs continuous.    . prochlorperazine (COMPAZINE) 10 MG tablet TAKE 1 TABLET BY MOUTH EVERY 6 HOURS AS NEEDED FOR NAUSEA OR VOMITING 30 tablet 1  . traZODone (DESYREL) 50 MG tablet TAKE 1 TABLET (50 MG TOTAL) BY MOUTH AT BEDTIME AS NEEDED FOR SLEEP. 30 tablet 0   No current facility-administered medications for this visit.    PHYSICAL EXAMINATION: ECOG PERFORMANCE STATUS: 1 - Symptomatic but completely ambulatory  Vitals:   02/14/21 1032  BP: 111/71  Pulse: 78  Resp: 18  Temp: (!) 97.4 F (36.3 C)  SpO2: 95%   Filed Weights   02/14/21 1032  Weight: 190 lb 9.6 oz (86.5 kg)    GENERAL:alert, no distress and comfortable SKIN: skin color, texture, turgor are normal, no rashes or significant lesions EYES: normal, Conjunctiva are pink and non-injected, sclera clear OROPHARYNX:no exudate, no erythema and lips, buccal mucosa, and tongue normal  NECK: supple, thyroid normal size, non-tender, without nodularity LYMPH:  no palpable lymphadenopathy in the cervical, axillary or inguinal LUNGS: clear to auscultation and percussion with normal breathing effort HEART: regular rate & rhythm and no murmurs and no lower extremity edema ABDOMEN:abdomen soft, non-tender and normal bowel sounds Musculoskeletal:no cyanosis of digits and no clubbing  NEURO: alert & oriented x 3 with fluent speech, no focal motor/sensory deficits  LABORATORY DATA:  I have reviewed the data as listed    Component Value Date/Time   NA 138 02/08/2021 0831   K 3.2 (L) 02/08/2021 0831   CL 92 (L) 02/08/2021 0831   CO2 29 02/08/2021 0831   GLUCOSE 160 (H) 02/08/2021 0831   BUN 6 (L) 02/08/2021 0831   CREATININE 0.57 02/08/2021 0831   CALCIUM 9.2 02/08/2021 0831   PROT 7.1 02/08/2021 0831   ALBUMIN 3.1 (L) 02/08/2021 0831   AST 15 02/08/2021 0831    ALT 9 02/08/2021 0831   ALKPHOS 92 02/08/2021 0831   BILITOT 0.5 02/08/2021 0831   GFRNONAA >60 02/08/2021 0831    No results found for: SPEP, UPEP  Lab Results  Component Value Date   WBC 6.2 02/08/2021   NEUTROABS 5.0 02/08/2021   HGB 11.8 (L) 02/08/2021   HCT 35.3 (L) 02/08/2021   MCV 81.7 02/08/2021   PLT 232 02/08/2021      Chemistry      Component Value Date/Time   NA 138 02/08/2021 0831   K 3.2 (L) 02/08/2021 0831   CL 92 (L) 02/08/2021 0831   CO2 29 02/08/2021 0831   BUN 6 (L) 02/08/2021 0831   CREATININE 0.57 02/08/2021 0831      Component Value Date/Time   CALCIUM 9.2 02/08/2021 0831   ALKPHOS 92 02/08/2021 0831   AST 15 02/08/2021 0831   ALT 9 02/08/2021 0831   BILITOT 0.5 02/08/2021 0831

## 2021-02-25 ENCOUNTER — Encounter (HOSPITAL_COMMUNITY): Payer: Self-pay | Admitting: Emergency Medicine

## 2021-02-25 ENCOUNTER — Emergency Department (HOSPITAL_COMMUNITY): Payer: Medicare Other

## 2021-02-25 ENCOUNTER — Emergency Department (HOSPITAL_COMMUNITY)
Admission: EM | Admit: 2021-02-25 | Discharge: 2021-02-25 | Disposition: A | Discharge status: Home / Self Care | Payer: Medicare Other | Attending: Emergency Medicine | Admitting: Emergency Medicine

## 2021-02-25 ENCOUNTER — Other Ambulatory Visit: Payer: Self-pay

## 2021-02-25 DIAGNOSIS — Z7982 Long term (current) use of aspirin: Secondary | ICD-10-CM | POA: Diagnosis not present

## 2021-02-25 DIAGNOSIS — I10 Essential (primary) hypertension: Secondary | ICD-10-CM | POA: Diagnosis not present

## 2021-02-25 DIAGNOSIS — Z8542 Personal history of malignant neoplasm of other parts of uterus: Secondary | ICD-10-CM | POA: Insufficient documentation

## 2021-02-25 DIAGNOSIS — R0602 Shortness of breath: Secondary | ICD-10-CM | POA: Diagnosis not present

## 2021-02-25 DIAGNOSIS — J449 Chronic obstructive pulmonary disease, unspecified: Secondary | ICD-10-CM | POA: Insufficient documentation

## 2021-02-25 DIAGNOSIS — R0781 Pleurodynia: Secondary | ICD-10-CM | POA: Insufficient documentation

## 2021-02-25 DIAGNOSIS — Z91010 Allergy to peanuts: Secondary | ICD-10-CM | POA: Diagnosis not present

## 2021-02-25 DIAGNOSIS — E119 Type 2 diabetes mellitus without complications: Secondary | ICD-10-CM | POA: Insufficient documentation

## 2021-02-25 DIAGNOSIS — R609 Edema, unspecified: Secondary | ICD-10-CM | POA: Diagnosis not present

## 2021-02-25 DIAGNOSIS — Z85118 Personal history of other malignant neoplasm of bronchus and lung: Secondary | ICD-10-CM | POA: Insufficient documentation

## 2021-02-25 DIAGNOSIS — Z79899 Other long term (current) drug therapy: Secondary | ICD-10-CM | POA: Diagnosis not present

## 2021-02-25 HISTORY — DX: Malignant (primary) neoplasm, unspecified: C80.1

## 2021-02-25 LAB — CBC WITH DIFFERENTIAL/PLATELET
Abs Immature Granulocytes: 0.04 10*3/uL (ref 0.00–0.07)
Basophils Absolute: 0 10*3/uL (ref 0.0–0.1)
Basophils Relative: 0 %
Eosinophils Absolute: 0 10*3/uL (ref 0.0–0.5)
Eosinophils Relative: 1 %
HCT: 38.6 % (ref 36.0–46.0)
Hemoglobin: 12.4 g/dL (ref 12.0–15.0)
Immature Granulocytes: 1 %
Lymphocytes Relative: 37 %
Lymphs Abs: 1.6 10*3/uL (ref 0.7–4.0)
MCH: 27.7 pg (ref 26.0–34.0)
MCHC: 32.1 g/dL (ref 30.0–36.0)
MCV: 86.4 fL (ref 80.0–100.0)
Monocytes Absolute: 0.6 10*3/uL (ref 0.1–1.0)
Monocytes Relative: 13 %
Neutro Abs: 2.1 10*3/uL (ref 1.7–7.7)
Neutrophils Relative %: 48 %
Platelets: 194 10*3/uL (ref 150–400)
RBC: 4.47 MIL/uL (ref 3.87–5.11)
RDW: 16.6 % — ABNORMAL HIGH (ref 11.5–15.5)
WBC: 4.4 10*3/uL (ref 4.0–10.5)
nRBC: 0 % (ref 0.0–0.2)

## 2021-02-25 LAB — BASIC METABOLIC PANEL
Anion gap: 13 (ref 5–15)
BUN: 6 mg/dL — ABNORMAL LOW (ref 8–23)
CO2: 29 mmol/L (ref 22–32)
Calcium: 9.3 mg/dL (ref 8.9–10.3)
Chloride: 95 mmol/L — ABNORMAL LOW (ref 98–111)
Creatinine, Ser: 0.52 mg/dL (ref 0.44–1.00)
GFR, Estimated: >60 mL/min (ref >60)
Glucose, Bld: 122 mg/dL — ABNORMAL HIGH (ref 70–99)
Potassium: 2.5 mmol/L — CL (ref 3.5–5.1)
Sodium: 137 mmol/L (ref 135–145)

## 2021-02-25 LAB — BRAIN NATRIURETIC PEPTIDE: B Natriuretic Peptide: 75.8 pg/mL (ref 0.0–100.0)

## 2021-02-25 LAB — TROPONIN I (HIGH SENSITIVITY)
Troponin I (High Sensitivity): 13 ng/L (ref <18)
Troponin I (High Sensitivity): 15 ng/L (ref <18)

## 2021-02-25 MED ORDER — POTASSIUM CHLORIDE CRYS ER 20 MEQ PO TBCR
60.0000 meq | EXTENDED_RELEASE_TABLET | Freq: Once | ORAL | Status: AC
  Administered 2021-02-25: 60 meq via ORAL
  Filled 2021-02-25: qty 3

## 2021-02-25 MED ORDER — POTASSIUM CHLORIDE CRYS ER 20 MEQ PO TBCR
40.0000 meq | EXTENDED_RELEASE_TABLET | Freq: Once | ORAL | Status: AC
  Administered 2021-02-25: 40 meq via ORAL
  Filled 2021-02-25: qty 2

## 2021-02-25 MED ORDER — IOHEXOL 350 MG/ML SOLN
100.0000 mL | Freq: Once | INTRAVENOUS | Status: AC | PRN
  Administered 2021-02-25: 100 mL via INTRAVENOUS

## 2021-02-25 MED ORDER — MAGNESIUM SULFATE 2 GM/50ML IV SOLN
2.0000 g | Freq: Once | INTRAVENOUS | Status: AC
  Administered 2021-02-25: 2 g via INTRAVENOUS
  Filled 2021-02-25: qty 50

## 2021-02-25 NOTE — ED Notes (Signed)
Pt ambulatory to the ED exam room with assist of Rollator.

## 2021-02-25 NOTE — ED Provider Notes (Signed)
[facility] DEPT Provider Note   CSN: [id number] Arrival date & time: 02/25/21  1239     History Chief Complaint  Patient presents with  . Shortness of Breath    Yvette Roberts is a 65 y.o. female.  HPI     65 year old female comes in a chief complaint of shortness of breath.   Patient has history of COPD, lung cancer currently on chemotherapy.  Patient had history of pleural effusion and she is recently started having some pleuritic chest pain.  Her symptoms are similar to her pleural effusion.  She did not want to wait too long and decided to come to the ER.  Patient denies any new cough, fevers, chills, orthopnea, PND.  Review of systems negative for fevers and chills.  Past Medical History:  Diagnosis Date  . Allergy   . Arthritis   . Cancer ([place])   . COPD (chronic obstructive pulmonary disease) ([place])   . Depression   . Esophageal reflux 07/21/2014  . Frozen shoulder 05/10/2015   Injected 05/10/2015   . Obesity, unspecified 07/21/2014  . Resistant hypertension 07/21/2014  . Type 2 diabetes mellitus without complication ([place]) 5/88/5027    Patient Active Problem List   Diagnosis Date Noted  . Hypokalemia 02/08/2021  . Other constipation 01/30/2021  . Abnormal weight loss 01/30/2021  . Cancer associated pain 01/08/2021  . Goals of care, counseling/discussion 01/08/2021  . Malignant pleural effusion 01/03/2021  . Metastasis to liver ([place]) 01/03/2021  . Uterine cancer ([place]) 12/20/2020  . Acute hypoxemic respiratory failure ([place]) 11/23/2020  . Pleural effusion, right 11/23/2020  . Atelectasis of right lung 11/23/2020  . Hyponatremia 11/23/2020  . Anxiety 05/09/2020  . Trigger point of right shoulder region 03/09/2019  . Cervical radiculopathy at C8 03/09/2019  . Unilateral primary osteoarthritis, left knee 11/17/2017  . Unilateral primary osteoarthritis, right knee 11/17/2017  . Degenerative arthritis of knee, bilateral 07/14/2017  .  Baker cyst, left 06/16/2017  . Pain and swelling of left lower extremity 05/26/2017  . Degenerative joint disease of knee, left 05/26/2017  . Hyperlipemia 06/12/2016  . MDD (major depressive disorder) 06/12/2016  . Urticaria 06/12/2016  . Type 2 diabetes mellitus without complication ([place]) 74/09/8785  . Class II obesity 07/21/2014  . Hypertension 07/21/2014  . Esophageal reflux 07/21/2014    Past Surgical History:  Procedure Laterality Date  . IR IMAGING GUIDED PORT INSERTION  01/16/2021  . LAPAROSCOPIC ABDOMINAL EXPLORATION    . miscarr       OB History    Gravida  1   Para  1   Term      Preterm      AB      Living        SAB      IAB      Ectopic      Multiple      Live Births              Family History  Problem Relation Age of Onset  . Breast cancer Sister        ? age of onset  . Breast cancer Maternal Aunt   . Breast cancer Maternal Aunt   . Breast cancer Maternal Aunt     Social History   Tobacco Use  . Smoking status: Never Smoker  . Smokeless tobacco: Never Used    Home Medications Prior to Admission medications   Medication Sig Start Date End Date Taking? Authorizing Provider  amLODipine (  NORVASC) 5 MG tablet Take 5 mg by mouth daily.   Yes [provider]  aspirin 81 MG tablet Take 81 mg by mouth daily.   Yes [provider]  carvedilol (COREG) 12.5 MG tablet Take 12.5 mg by mouth 2 (two) times daily with a meal.   Yes [provider]  Cholecalciferol (VITAMIN D) 125 MCG (5000 UT) CAPS Take 5,000 Units by mouth daily.   Yes [provider]  dexamethasone (DECADRON) 4 MG tablet TAKE 2 TABLETS BY MOUTH NIGHT BEFORE AND 2 TABLETS IN THE MORNING OF CHEMO EVERY 3 WEEKS FOR 6 CYCLES 01/08/21 01/08/22 Yes Gorsuch, Ni, MD  FLUoxetine (PROZAC) 40 MG capsule TAKE 1 CAPSULE(40 MG) BY MOUTH DAILY Patient taking differently: Take 40 mg by mouth daily. 08/21/20  Yes Colin Benton R, DO  HYDROmorphone (DILAUDID) 4  MG tablet TAKE 1 TABLET BY MOUTH EVERY 4 HOURS AS NEEDED FOR SEVERE PAIN. Patient taking differently: Take 4 mg by mouth every 4 (four) hours as needed for severe pain. 02/08/21 08/07/21 Yes Gorsuch, Ni, MD  lidocaine (LIDODERM) 5 % Place 1 patch onto the skin daily. Remove & Discard patch within 12 hours. 12/26/20  Yes Lafonda Mosses, MD  lidocaine-prilocaine (EMLA) cream APPLY TO AFFECTED AREA ONCE AS DIRECTED Patient taking differently: Apply 1 application topically once. 01/08/21 01/08/22 Yes Gorsuch, Ni, MD  MELATONIN PO Take 10 mg by mouth at bedtime.   Yes [provider]  omeprazole (PRILOSEC) 40 MG capsule TAKE 1 CAPSULE(40 MG) BY MOUTH DAILY Patient taking differently: Take 40 mg by mouth daily. 03/20/20  Yes Colin Benton R, DO  ondansetron (ZOFRAN) 8 MG tablet TAKE 1 TABLET BY MOUTH EVERY 8 HOURS AS NEEDED START ON THE THIRD DAY AFTER CHEMO Patient taking differently: Take 8 mg by mouth every 8 (eight) hours as needed for nausea or vomiting. 01/08/21 01/08/22 Yes Gorsuch, Ni, MD  polyethylene glycol (MIRALAX / GLYCOLAX) 17 g packet Take 17 g by mouth daily.   Yes [provider]  prochlorperazine (COMPAZINE) 10 MG tablet TAKE 1 TABLET BY MOUTH EVERY 6 HOURS AS NEEDED FOR NAUSEA OR VOMITING Patient taking differently: Take 10 mg by mouth every 6 (six) hours as needed for nausea or vomiting. 01/08/21 01/08/22 Yes Gorsuch, Ni, MD  Alum & Mag Hydroxide-Simeth (MYLANTA PO) Take 1 Dose by mouth as needed (stomach upset, heartburn, indigestion.).    [provider]  dicyclomine (BENTYL) 20 MG tablet Take 20 mg by mouth 2 (two) times daily as needed for spasms. 01/03/21   [provider]  traZODone (DESYREL) 50 MG tablet TAKE 1 TABLET (50 MG TOTAL) BY MOUTH AT BEDTIME AS NEEDED FOR SLEEP. Patient not taking: No sig reported 11/30/20 11/30/21  Geradine Girt, DO    Allergies    Eggs or egg-derived products, Influenza vaccines, Peanut-containing drug products,  Penicillins, Sulfa antibiotics, and Codeine  Review of Systems   Review of Systems  Constitutional: Positive for activity change.  Respiratory: Positive for shortness of breath.   Cardiovascular: Positive for chest pain.  Gastrointestinal: Negative for nausea and vomiting.  Allergic/Immunologic: Positive for immunocompromised state.  All other systems reviewed and are negative.   Physical Exam Updated Vital Signs BP 124/76   Pulse 90   Temp 97.8 F (36.6 C) (Oral)   Resp (!) 23   SpO2 95%   Physical Exam Vitals and nursing note reviewed.  Constitutional:      Appearance: She is well-developed.  HENT:  Head: Normocephalic and atraumatic.  Eyes:     Pupils: Pupils are equal, round, and reactive to light.  Cardiovascular:     Rate and Rhythm: Normal rate and regular rhythm.     Heart sounds: Normal heart sounds. No murmur heard.   Pulmonary:     Effort: Pulmonary effort is normal. No respiratory distress.     Breath sounds: Examination of the right-upper field reveals decreased breath sounds. Examination of the right-middle field reveals decreased breath sounds. Examination of the right-lower field reveals decreased breath sounds. Decreased breath sounds present. No wheezing or rales.  Abdominal:     General: There is no distension.     Palpations: Abdomen is soft.     Tenderness: There is no abdominal tenderness. There is no guarding or rebound.  Musculoskeletal:     Cervical back: Neck supple.     Right lower leg: No tenderness. Edema present.     Left lower leg: No tenderness. Edema present.  Skin:    General: Skin is warm and dry.  Neurological:     Mental Status: She is alert and oriented to person, place, and time.     ED Results / Procedures / Treatments   Labs (all labs ordered are listed, but only abnormal results are displayed) Labs Reviewed  CBC WITH DIFFERENTIAL/PLATELET - Abnormal; Notable for the following components:      Result Value   RDW  16.6 (*)    All other components within normal limits  BASIC METABOLIC PANEL - Abnormal; Notable for the following components:   Potassium 2.5 (*)    Chloride 95 (*)    Glucose, Bld 122 (*)    BUN 6 (*)    All other components within normal limits  BRAIN NATRIURETIC PEPTIDE  TROPONIN I (HIGH SENSITIVITY)  TROPONIN I (HIGH SENSITIVITY)    EKG EKG Interpretation  Date/Time:  Sunday Feb 25 2021 13:53:21 EDT Ventricular Rate:  77 PR Interval:  124 QRS Duration: 95 QT Interval:  370 QTC Calculation: 419 R Axis:   14 Text Interpretation: Sinus rhythm Probable left atrial enlargement Abnormal R-wave progression, early transition LVH with secondary repolarization abnormality No acute changes No significant change since last tracing Confirmed by Varney Biles [phone]) on 02/25/2021 6:50:40 PM   Radiology CT Angio Chest PE W and/or Wo Contrast  Result Date: 02/25/2021 CLINICAL DATA:  History of metastatic uterine cancer with malignant right pleural effusion, short of breath EXAM: CT ANGIOGRAPHY CHEST WITH CONTRAST TECHNIQUE: Multidetector CT imaging of the chest was performed using the standard protocol during bolus administration of intravenous contrast. Multiplanar CT image reconstructions and MIPs were obtained to evaluate the vascular anatomy. CONTRAST:  133mL OMNIPAQUE IOHEXOL 350 MG/ML SOLN COMPARISON:  11/23/2020, 12/08/2020 FINDINGS: Cardiovascular: This is a technically adequate evaluation of the pulmonary vasculature. No filling defects or pulmonary emboli. The heart is unremarkable without pericardial effusion. No evidence of thoracic aortic aneurysm or dissection. Right chest wall port via internal jugular approach, tip within the superior vena cava. Mediastinum/Nodes: No enlarged mediastinal, hilar, or axillary lymph nodes. Thyroid gland, trachea, and esophagus demonstrate no significant findings. Lungs/Pleura: Diffuse right pleural thickening consistent with known pleural metastases.  Loculated right pleural effusion is noted, not appreciably changed since prior PET scan. Minimal atelectasis within the right lower lobe. No acute airspace disease. No pneumothorax. The central airways are patent. Upper Abdomen: The hypodensities within the right lobe liver seen on previous studies are not as well visualized on this exam, may reflect decreased  size due to response to therapy. The subcapsular hypodensity measures 2 cm on today's exam, previously having measured up to 4 cm. Trace Peri hepatic fluid under the right hemidiaphragm. Musculoskeletal: There is new sclerosis along the inferior endplate of the F00 vertebral body, without overlying bony destruction. This is nonspecific. No other acute or destructive bony lesions. Reconstructed images demonstrate no additional findings. Review of the MIP images confirms the above findings. IMPRESSION: 1. No evidence of pulmonary embolus. 2. Persistent right pleural thickening and loculated right pleural effusion, without significant change since previous PET scan. 3. Decreased prominence of the hypodensities along the superior aspect right lobe liver, likely representing response to therapy. 4. Indeterminate sclerotic focus along the inferior endplate of the F12 vertebral body, new since prior study. This would be an unusual location for bony metastases, and may reflect developing Schmorl's node. Electronically Signed   By: Randa Ngo M.D.   On: 02/25/2021 17:32    Procedures Procedures   Medications Ordered in ED Medications  iohexol (OMNIPAQUE) 350 MG/ML injection 100 mL (100 mLs Intravenous Contrast Given 02/25/21 1706)  potassium chloride SA (KLOR-CON) CR tablet 40 mEq (40 mEq Oral Given 02/25/21 1829)  magnesium sulfate IVPB 2 g 50 mL (0 g Intravenous Stopped 02/25/21 1929)  potassium chloride SA (KLOR-CON) CR tablet 60 mEq (60 mEq Oral Given 02/25/21 1950)    ED Course  I have reviewed the triage vital signs and the nursing notes.  Pertinent  labs & imaging results that were available during my care of the patient were reviewed by me and considered in my medical decision making (see chart for details).    MDM Rules/Calculators/A&P                          65 year old female comes in a chief complaint of shortness of breath.  She has history of COPD and lung cancer.  On exam there is no wheezing, with a right side of the lung is diminished.  Initial thought is that patient might have pleural effusion.  Chest x-ray ordered and there is no evidence of pneumothorax, pneumonia.  The x-ray looks a lot better from pleural effusion perspective than he did in the past.  Decision was made to proceed with CT angiogram to ensure there is no PE, and that 2 is negative.  Results of the ER work-up discussed with the patient.  She stable for discharge.  Final Clinical Impression(s) / ED Diagnoses Final diagnoses:  Shortness of breath    Rx / DC Orders ED Discharge Orders    None       Varney Biles, MD 02/27/21 1640

## 2021-02-25 NOTE — ED Triage Notes (Signed)
Patient reports SOB which started Friday. She reports having a pleural effusion in January and was admitted for respiratory failure. She reports having a feeling of fullness that feels similar to the episode in January. She has hx of DM, HTN, and uterine cancer w/ mets to liver

## 2021-02-25 NOTE — ED Triage Notes (Signed)
Emergency Medicine Provider Triage Evaluation Note  Yvette Roberts , a 65 y.o. female  was evaluated in triage.  Pt complains of sob x friday.  Hx of pleural effusion. Feels similar. No CP, cough, covid exposures. No leg swelling. Hx of cancer  Review of Systems  Positive: sob Negative: Cp, cough  Physical Exam  BP 134/87 (BP Location: Left Arm)   Pulse 91   Temp 98.5 F (36.9 C) (Oral)   Resp 17   SpO2 95%  Gen:   Awake, no distress   HEENT:  Atraumatic  Resp:  Decreased lung sounds on right, no respiratory distress Cardiac:  Normal rate  Abd:   Nondistended, nontender  MSK:   Moves extremities without difficulty  Neuro:  Speech clear   Medical Decision Making  Medically screening exam initiated at 1:06 PM.  Appropriate orders placed.  Yvette Roberts was informed that the remainder of the evaluation will be completed by another provider, this initial triage assessment does not replace that evaluation, and the importance of remaining in the ED until their evaluation is complete.  Clinical Impression  sob   Terris Bodin A, PA-C 02/25/21 1308

## 2021-02-25 NOTE — ED Notes (Signed)
Provider at the bedside to evaluate. 

## 2021-02-25 NOTE — ED Notes (Signed)
provider back at bedside to re-evaluate.

## 2021-02-25 NOTE — ED Notes (Signed)
Charge to place US guided IV for CTA access. Provider notified and aware. CTA pending IV access at this time.

## 2021-02-25 NOTE — ED Notes (Signed)
Pt declines port access at this time. Will establish peripheral IV access.

## 2021-02-25 NOTE — ED Notes (Signed)
Patient ambulated with pulse ox, maintained O2 sats between 96-99% with good wave form observed. RN made aware.

## 2021-02-25 NOTE — Discharge Instructions (Addendum)
The work-up in the ER that comprised of looking at your lungs, evaluating your heart and making sure you do not have anemia is all normal.  There is no new fluid collection in your lungs.  Please follow-up with your primary care doctor / cancer doctor.

## 2021-02-26 ENCOUNTER — Telehealth: Payer: Self-pay

## 2021-02-26 NOTE — Telephone Encounter (Signed)
RN received message from on-call that patient went to ED over the weekend due to shortness of breath.   RN attempted to call patient, left voicemail for call back.    Will forward to MD so they are aware.

## 2021-02-28 ENCOUNTER — Other Ambulatory Visit: Payer: Self-pay

## 2021-02-28 DIAGNOSIS — E1169 Type 2 diabetes mellitus with other specified complication: Secondary | ICD-10-CM

## 2021-02-28 DIAGNOSIS — R5383 Other fatigue: Secondary | ICD-10-CM

## 2021-03-01 ENCOUNTER — Other Ambulatory Visit (HOSPITAL_COMMUNITY): Payer: Self-pay

## 2021-03-01 ENCOUNTER — Encounter: Payer: Self-pay | Admitting: Hematology and Oncology

## 2021-03-01 ENCOUNTER — Inpatient Hospital Stay: Payer: Medicare Other | Attending: Hematology and Oncology

## 2021-03-01 ENCOUNTER — Inpatient Hospital Stay (HOSPITAL_BASED_OUTPATIENT_CLINIC_OR_DEPARTMENT_OTHER): Payer: Medicare Other | Admitting: Hematology and Oncology

## 2021-03-01 ENCOUNTER — Other Ambulatory Visit: Payer: Self-pay | Admitting: Hematology and Oncology

## 2021-03-01 ENCOUNTER — Other Ambulatory Visit: Payer: Medicare Other

## 2021-03-01 ENCOUNTER — Other Ambulatory Visit: Payer: Self-pay

## 2021-03-01 ENCOUNTER — Inpatient Hospital Stay: Payer: Medicare Other

## 2021-03-01 VITALS — Temp 98.2°F

## 2021-03-01 VITALS — BP 128/69 | HR 77 | Resp 18 | Ht 66.0 in | Wt 186.0 lb

## 2021-03-01 DIAGNOSIS — R634 Abnormal weight loss: Secondary | ICD-10-CM

## 2021-03-01 DIAGNOSIS — Z79899 Other long term (current) drug therapy: Secondary | ICD-10-CM | POA: Diagnosis not present

## 2021-03-01 DIAGNOSIS — R5383 Other fatigue: Secondary | ICD-10-CM

## 2021-03-01 DIAGNOSIS — C55 Malignant neoplasm of uterus, part unspecified: Secondary | ICD-10-CM

## 2021-03-01 DIAGNOSIS — J910 Malignant pleural effusion: Secondary | ICD-10-CM | POA: Diagnosis not present

## 2021-03-01 DIAGNOSIS — G893 Neoplasm related pain (acute) (chronic): Secondary | ICD-10-CM | POA: Insufficient documentation

## 2021-03-01 DIAGNOSIS — E876 Hypokalemia: Secondary | ICD-10-CM

## 2021-03-01 DIAGNOSIS — C787 Secondary malignant neoplasm of liver and intrahepatic bile duct: Secondary | ICD-10-CM | POA: Diagnosis not present

## 2021-03-01 DIAGNOSIS — E1169 Type 2 diabetes mellitus with other specified complication: Secondary | ICD-10-CM

## 2021-03-01 DIAGNOSIS — Z5111 Encounter for antineoplastic chemotherapy: Secondary | ICD-10-CM | POA: Diagnosis present

## 2021-03-01 LAB — CBC WITH DIFFERENTIAL (CANCER CENTER ONLY)
Abs Immature Granulocytes: 0.12 10*3/uL — ABNORMAL HIGH (ref 0.00–0.07)
Basophils Absolute: 0 10*3/uL (ref 0.0–0.1)
Basophils Relative: 0 %
Eosinophils Absolute: 0 10*3/uL (ref 0.0–0.5)
Eosinophils Relative: 0 %
HCT: 35.3 % — ABNORMAL LOW (ref 36.0–46.0)
Hemoglobin: 11.5 g/dL — ABNORMAL LOW (ref 12.0–15.0)
Immature Granulocytes: 2 %
Lymphocytes Relative: 17 %
Lymphs Abs: 1 10*3/uL (ref 0.7–4.0)
MCH: 27.4 pg (ref 26.0–34.0)
MCHC: 32.6 g/dL (ref 30.0–36.0)
MCV: 84 fL (ref 80.0–100.0)
Monocytes Absolute: 0.1 10*3/uL (ref 0.1–1.0)
Monocytes Relative: 1 %
Neutro Abs: 4.8 10*3/uL (ref 1.7–7.7)
Neutrophils Relative %: 80 %
Platelet Count: 178 10*3/uL (ref 150–400)
RBC: 4.2 MIL/uL (ref 3.87–5.11)
RDW: 16.5 % — ABNORMAL HIGH (ref 11.5–15.5)
WBC Count: 6 10*3/uL (ref 4.0–10.5)
nRBC: 0 % (ref 0.0–0.2)

## 2021-03-01 LAB — CMP (CANCER CENTER ONLY)
ALT: 20 U/L (ref 0–44)
AST: 26 U/L (ref 15–41)
Albumin: 3.1 g/dL — ABNORMAL LOW (ref 3.5–5.0)
Alkaline Phosphatase: 90 U/L (ref 38–126)
Anion gap: 12 (ref 5–15)
BUN: 7 mg/dL — ABNORMAL LOW (ref 8–23)
CO2: 29 mmol/L (ref 22–32)
Calcium: 9.1 mg/dL (ref 8.9–10.3)
Chloride: 94 mmol/L — ABNORMAL LOW (ref 98–111)
Creatinine: 0.67 mg/dL (ref 0.44–1.00)
GFR, Estimated: >60 mL/min (ref >60)
Glucose, Bld: 198 mg/dL — ABNORMAL HIGH (ref 70–99)
Potassium: 3.5 mmol/L (ref 3.5–5.1)
Sodium: 135 mmol/L (ref 135–145)
Total Bilirubin: 0.5 mg/dL (ref 0.3–1.2)
Total Protein: 7.5 g/dL (ref 6.5–8.1)

## 2021-03-01 LAB — MAGNESIUM: Magnesium: 1.1 mg/dL — ABNORMAL LOW (ref 1.7–2.4)

## 2021-03-01 LAB — TSH: TSH: 0.601 u[IU]/mL (ref 0.350–4.500)

## 2021-03-01 LAB — HEMOGLOBIN A1C
Hgb A1c MFr Bld: 5.7 % — ABNORMAL HIGH (ref 4.8–5.6)
Mean Plasma Glucose: 116.89 mg/dL

## 2021-03-01 MED ORDER — SODIUM CHLORIDE 0.9% FLUSH
10.0000 mL | Freq: Once | INTRAVENOUS | Status: AC
  Administered 2021-03-01: 10 mL
  Filled 2021-03-01: qty 10

## 2021-03-01 MED ORDER — MAGNESIUM OXIDE -MG SUPPLEMENT 400 (240 MG) MG PO TABS
400.0000 mg | ORAL_TABLET | Freq: Two times a day (BID) | ORAL | 1 refills | Status: DC
  Filled 2021-03-01: qty 60, 30d supply, fill #0

## 2021-03-01 MED ORDER — PALONOSETRON HCL INJECTION 0.25 MG/5ML
INTRAVENOUS | Status: AC
  Filled 2021-03-01: qty 5

## 2021-03-01 MED ORDER — MAGNESIUM SULFATE 2 GM/50ML IV SOLN
2.0000 g | Freq: Once | INTRAVENOUS | Status: AC
  Administered 2021-03-01: 2 g via INTRAVENOUS

## 2021-03-01 MED ORDER — HEPARIN SOD (PORK) LOCK FLUSH 100 UNIT/ML IV SOLN
500.0000 [IU] | Freq: Once | INTRAVENOUS | Status: AC | PRN
  Administered 2021-03-01: 500 [IU]
  Filled 2021-03-01: qty 5

## 2021-03-01 MED ORDER — LIDOCAINE 5 % EX PTCH
1.0000 | MEDICATED_PATCH | CUTANEOUS | 0 refills | Status: DC
  Filled 2021-03-01: qty 30, 30d supply, fill #0

## 2021-03-01 MED ORDER — SODIUM CHLORIDE 0.9 % IV SOLN
600.0000 mg | Freq: Once | INTRAVENOUS | Status: AC
  Administered 2021-03-01: 600 mg via INTRAVENOUS
  Filled 2021-03-01: qty 60

## 2021-03-01 MED ORDER — HYDROMORPHONE HCL 4 MG PO TABS
ORAL_TABLET | ORAL | 0 refills | Status: DC | PRN
  Filled 2021-03-01: qty 60, 10d supply, fill #0

## 2021-03-01 MED ORDER — SODIUM CHLORIDE 0.9% FLUSH
10.0000 mL | INTRAVENOUS | Status: DC | PRN
  Administered 2021-03-01: 10 mL
  Filled 2021-03-01: qty 10

## 2021-03-01 MED ORDER — SODIUM CHLORIDE 0.9 % IV SOLN
140.0000 mg/m2 | Freq: Once | INTRAVENOUS | Status: AC
  Administered 2021-03-01: 276 mg via INTRAVENOUS
  Filled 2021-03-01: qty 46

## 2021-03-01 MED ORDER — PALONOSETRON HCL INJECTION 0.25 MG/5ML
0.2500 mg | Freq: Once | INTRAVENOUS | Status: AC
  Administered 2021-03-01: 0.25 mg via INTRAVENOUS

## 2021-03-01 MED ORDER — MAGNESIUM SULFATE 2 GM/50ML IV SOLN
INTRAVENOUS | Status: AC
  Filled 2021-03-01: qty 50

## 2021-03-01 MED ORDER — FAMOTIDINE 20 MG IN NS 100 ML IVPB
20.0000 mg | Freq: Once | INTRAVENOUS | Status: AC
Start: 2021-03-01 — End: 2021-03-01
  Administered 2021-03-01: 20 mg via INTRAVENOUS

## 2021-03-01 MED ORDER — SODIUM CHLORIDE 0.9 % IV SOLN
Freq: Once | INTRAVENOUS | Status: AC
  Filled 2021-03-01: qty 250

## 2021-03-01 MED ORDER — DIPHENHYDRAMINE HCL 50 MG/ML IJ SOLN
25.0000 mg | Freq: Once | INTRAMUSCULAR | Status: AC
  Administered 2021-03-01: 25 mg via INTRAVENOUS

## 2021-03-01 MED ORDER — SODIUM CHLORIDE 0.9 % IV SOLN
150.0000 mg | Freq: Once | INTRAVENOUS | Status: AC
  Administered 2021-03-01: 150 mg via INTRAVENOUS
  Filled 2021-03-01: qty 150

## 2021-03-01 MED ORDER — SODIUM CHLORIDE 0.9 % IV SOLN
10.0000 mg | Freq: Once | INTRAVENOUS | Status: AC
  Administered 2021-03-01: 10 mg via INTRAVENOUS
  Filled 2021-03-01: qty 10

## 2021-03-01 MED ORDER — FAMOTIDINE 20 MG IN NS 100 ML IVPB
INTRAVENOUS | Status: AC
  Filled 2021-03-01: qty 100

## 2021-03-01 MED ORDER — DIPHENHYDRAMINE HCL 50 MG/ML IJ SOLN
INTRAMUSCULAR | Status: AC
  Filled 2021-03-01: qty 1

## 2021-03-01 NOTE — Assessment & Plan Note (Signed)
Her pain control is stable I refill her prescription pain medicine

## 2021-03-01 NOTE — Assessment & Plan Note (Signed)
Overall, she tolerated treatment well Her recent CT imaging showed positive response to treatment I recommend we proceed with treatment today, dose adjusted to her weight and we will proceed with PET/CT imaging in 2 weeks for objective assessment of response to therapy

## 2021-03-01 NOTE — Assessment & Plan Note (Signed)
The hypomagnesemia is contributing to her recent hypokalemia I recommend IV magnesium and oral magnesium supplement

## 2021-03-01 NOTE — Progress Notes (Signed)
[facility] OFFICE PROGRESS NOTE  Patient Care Team: [name], FNP as PCP - General (Family Medicine) Awanda Mink Craige Cotta, RN as Oncology Nurse Navigator (Oncology)  ASSESSMENT & PLAN:  Uterine cancer [facility]) Overall, she tolerated treatment well Her recent CT imaging showed positive response to treatment I recommend we proceed with treatment today, dose adjusted to her weight and we will proceed with PET/CT imaging in 2 weeks for objective assessment of response to therapy  Hypomagnesemia The hypomagnesemia is contributing to her recent hypokalemia I recommend IV magnesium and oral magnesium supplement  Malignant pleural effusion Her recent CT imaging of the chest showed stable pleural effusion She does not need thoracentesis  Abnormal weight loss She continues to have mild progressive weight loss but that was due to recent nausea She is eating better I will reduce the dose of her treatment a little bit  Cancer associated pain Her pain control is stable I refill her prescription pain medicine   Orders Placed This Encounter  Procedures  . NM PET Image Restage (PS) Skull Base to Thigh    Standing Status:   Future    Standing Expiration Date:   03/01/2022    Order Specific Question:   If indicated for the ordered procedure, I authorize the administration of a radiopharmaceutical per Radiology protocol    Answer:   Yes    Order Specific Question:   Preferred imaging location?    Answer:   [facility]    Order Specific Question:   Radiology Contrast Protocol - do NOT remove file path    Answer:   [email or web address]    All questions were answered. The patient knows to call the clinic with any problems, questions or concerns. The total time spent in the appointment was 30 minutes encounter with patients including review of chart and various tests results, discussions about plan of care and coordination of care  plan   [name], MD 03/01/2021 11:51 AM  INTERVAL HISTORY: Please see below for problem oriented charting. She returns for cycle 3 of treatment She went to the emergency department recently with weakness and nausea and was found to have severe hypokalemia She could not tolerate oral potassium supplement She has lost some weight due to recent nausea Denies constipation Her pain is well controlled  SUMMARY OF ONCOLOGIC HISTORY: Oncology History Overview Note  Adenocarcinoma with component of squamous differentiation and serous component  HER2 negative IHC MMR intact   Uterine cancer ([place])  11/23/2020 Pathology Results   FINAL MICROSCOPIC DIAGNOSIS:  - Malignant cells consistent with non-small cell carcinoma  - See comment   SPECIMEN ADEQUACY:  Satisfactory for evaluation   DIAGNOSTIC COMMENTS:  Morphologically, the malignant cells are suggestive of an adenocarcinoma.  However, immunohistochemical stains show that the tumor cells are positive for CK7 with patchy labeling for p63 and CK 5/6 whereas they are negative for TTF-1, Napsin-A, CK20, calretinin, D2-40, GATA3, ER and CDX2.  Ki-67 stain shows increased proliferative index. Positive staining for CK5/6 and p63 is suggestive of elements of squamous differentiation.   11/23/2020 Imaging   Ct chest 1. Near complete evacuation of the right pleural effusion after right-sided pigtail drainage catheter placement. Trace residual effusion is partially loculated. 2. Dense consolidation throughout the right lung, greatest at the right lung base. Findings are consistent with a combination of pneumonia and atelectasis. Follow-up imaging after completion of appropriate medical management is recommended to exclude underlying neoplasm. 3. Indeterminate hypodensity right lobe liver. If further  evaluation is desired, dedicated liver CT or MRI may be useful.   12/08/2020 PET scan   1. Unusual pattern of hypermetabolic metastatic disease. 2.  Intense metabolic activity associated with pleural thickening in the RIGHT hemithorax consistent with pleural metastasis versus primary pleural malignancy. 3. Isolated LEFT internal mammary nodal metastasis. 4. Intensely hypermetabolic lesion within the liver beneath the hypermetabolic lesions within the diaphragm. Differential includes local extension of pleural metastasis, hematogenous liver metastasis versus hepatic abscess. Favor hematogenous liver metastasis.  5. Hypermetabolic retroperitoneal metastatic adenopathy and internal iliac adenopathy. 6. Hypermetabolic thickening within the LEFT rectus muscle consistent with muscular skeletal metastasis. 7. Intense metabolic activity within enlarged uterus. Recommend gyn evaluation and potential MRI of the uterus to evaluate for primary uterine carcinoma. Unusual site of metastasis. Benign leiomyoma can be hypermetabolic but typically not this intense. 8. Rim of hypermetabolic activity in the vagina towards the introitus. Recommend evaluation as above.   12/20/2020 Initial Diagnosis   Uterine cancer ([place])   12/26/2020 Pathology Results   A. ENDOCERVIX, CURETTAGE:  - Scant benign squamous epithelium.   B. ENDOMETRIUM, BIOPSY:  - Adenocarcinoma, see comment.   COMMENT:   B. There are malignant glands some which have marked cytologic atypia. There if focal squamous differentiation. The tumor is positive for cytokeratin 7, PAX-8, GATA-3, ER (10% strong), p53, and WT-1. The cells are negative for cytokeratin 20, CDX-2, TTF-1, and NapsinA. The overall findings are consistent with a gynecologic adenocarcinoma. While squamous differentiation is most commonly seen in endometrioid/endocervical adenocarcinoma, the strong p53 staining and focal marked atypia is suggestive of a serous component.    01/03/2021 Cancer Staging   Staging form: Corpus Uteri - Carcinoma and Carcinosarcoma, AJCC 8th Edition - Clinical: FIGO Stage IVB (cT3b, cN2a, pM1) - Signed by  [name], MD on 01/03/2021   01/16/2021 Procedure   Successful placement of a right internal jugular approach power injectable Port-A-Cath. The catheter is ready for immediate use.   01/17/2021 -  Chemotherapy    Patient is on Treatment Plan: UTERINE CARBOPLATIN AUC 6 / PACLITAXEL Q21D      02/25/2021 Imaging   1. No evidence of pulmonary embolus. 2. Persistent right pleural thickening and loculated right pleural effusion, without significant change since previous PET scan. 3. Decreased prominence of the hypodensities along the superior aspect right lobe liver, likely representing response to therapy. 4. Indeterminate sclerotic focus along the inferior endplate of the T70 vertebral body, new since prior study. This would be an unusual location for bony metastases, and may reflect developing Schmorl's node.   Metastasis to liver ([place])  01/03/2021 Initial Diagnosis   Metastasis to liver ([place])   01/17/2021 -  Chemotherapy    Patient is on Treatment Plan: UTERINE CARBOPLATIN AUC 6 / PACLITAXEL Q21D        REVIEW OF SYSTEMS:   Constitutional: Denies fevers, chills  Eyes: Denies blurriness of vision Ears, nose, mouth, throat, and face: Denies mucositis or sore throat Respiratory: Denies cough, dyspnea or wheezes Cardiovascular: Denies palpitation, chest discomfort or lower extremity swelling Skin: Denies abnormal skin rashes Lymphatics: Denies new lymphadenopathy or easy bruising Neurological:Denies numbness, tingling or new weaknesses Behavioral/Psych: Mood is stable, no new changes  All other systems were reviewed with the patient and are negative.  I have reviewed the past medical history, past surgical history, social history and family history with the patient and they are unchanged from previous note.  ALLERGIES:  is allergic to eggs or egg-derived products, influenza vaccines, peanut-containing drug products,  penicillins, sulfa antibiotics, and codeine.  MEDICATIONS:  Current  Outpatient Medications  Medication Sig Dispense Refill  . magnesium oxide (MAG-OX) 400 (240 Mg) MG tablet Take 1 tablet (400 mg total) by mouth 2 (two) times daily. 60 tablet 1  . Alum & Mag Hydroxide-Simeth (MYLANTA PO) Take 1 Dose by mouth as needed (stomach upset, heartburn, indigestion.).    Marland Kitchen amLODipine (NORVASC) 5 MG tablet Take 5 mg by mouth daily.    Marland Kitchen aspirin 81 MG tablet Take 81 mg by mouth daily.    . carvedilol (COREG) 12.5 MG tablet Take 12.5 mg by mouth 2 (two) times daily with a meal.    . Cholecalciferol (VITAMIN D) 125 MCG (5000 UT) CAPS Take 5,000 Units by mouth daily.    Marland Kitchen dexamethasone (DECADRON) 4 MG tablet TAKE 2 TABLETS BY MOUTH NIGHT BEFORE AND 2 TABLETS IN THE MORNING OF CHEMO EVERY 3 WEEKS FOR 6 CYCLES 36 tablet 6  . dicyclomine (BENTYL) 20 MG tablet Take 20 mg by mouth 2 (two) times daily as needed for spasms.    Marland Kitchen FLUoxetine (PROZAC) 40 MG capsule TAKE 1 CAPSULE(40 MG) BY MOUTH DAILY (Patient taking differently: Take 40 mg by mouth daily.) 15 capsule 0  . HYDROmorphone (DILAUDID) 4 MG tablet TAKE 1 TABLET BY MOUTH EVERY 4 HOURS AS NEEDED FOR SEVERE PAIN. 60 tablet 0  . lidocaine (LIDODERM) 5 % Place 1 patch onto the skin daily. Remove & Discard patch within 12 hours. 30 patch 0  . lidocaine-prilocaine (EMLA) cream APPLY TO AFFECTED AREA ONCE AS DIRECTED (Patient taking differently: Apply 1 application topically once.) 30 g 3  . MELATONIN PO Take 10 mg by mouth at bedtime.    Marland Kitchen omeprazole (PRILOSEC) 40 MG capsule TAKE 1 CAPSULE(40 MG) BY MOUTH DAILY (Patient taking differently: Take 40 mg by mouth daily.) 30 capsule 0  . ondansetron (ZOFRAN) 8 MG tablet TAKE 1 TABLET BY MOUTH EVERY 8 HOURS AS NEEDED START ON THE THIRD DAY AFTER CHEMO (Patient taking differently: Take 8 mg by mouth every 8 (eight) hours as needed for nausea or vomiting.) 30 tablet 1  . polyethylene glycol (MIRALAX / GLYCOLAX) 17 g packet Take 17 g by mouth daily.    . prochlorperazine (COMPAZINE) 10 MG  tablet TAKE 1 TABLET BY MOUTH EVERY 6 HOURS AS NEEDED FOR NAUSEA OR VOMITING (Patient taking differently: Take 10 mg by mouth every 6 (six) hours as needed for nausea or vomiting.) 30 tablet 1  . traZODone (DESYREL) 50 MG tablet TAKE 1 TABLET (50 MG TOTAL) BY MOUTH AT BEDTIME AS NEEDED FOR SLEEP. (Patient not taking: No sig reported) 30 tablet 0   No current facility-administered medications for this visit.   Facility-Administered Medications Ordered in Other Visits  Medication Dose Route Frequency Provider Last Rate Last Admin  . CARBOplatin (PARAPLATIN) 600 mg in sodium chloride 0.9 % 250 mL chemo infusion  600 mg Intravenous Once Alvy Bimler, Jandel Patriarca, MD      . dexamethasone (DECADRON) 10 mg in sodium chloride 0.9 % 50 mL IVPB  10 mg Intravenous Once Alvy Bimler, Laneisha Mino, MD 204 mL/hr at 03/01/21 1150 10 mg at 03/01/21 1150  . fosaprepitant (EMEND) 150 mg in sodium chloride 0.9 % 145 mL IVPB  150 mg Intravenous Once Fatisha Rabalais, MD      . heparin lock flush 100 unit/mL  500 Units Intracatheter Once PRN Alvy Bimler, Safira Proffit, MD      . magnesium sulfate IVPB 2 g 50 mL  2 g Intravenous Once  [name], MD      . PACLitaxel (TAXOL) 276 mg in sodium chloride 0.9 % 250 mL chemo infusion (> $RemoveBef'80mg'hudsRAnFiD$ /m2)  140 mg/m2 (Treatment Plan Recorded) Intravenous Once Cassady Turano, MD      . sodium chloride flush (NS) 0.9 % injection 10 mL  10 mL Intracatheter PRN Alvy Bimler, Richerd Grime, MD        PHYSICAL EXAMINATION: ECOG PERFORMANCE STATUS: 2 - Symptomatic, <50% confined to bed  Vitals:   03/01/21 1042  BP: 128/69  Pulse: 77  Resp: 18  SpO2: 97%   Filed Weights   03/01/21 1042  Weight: 186 lb (84.4 kg)    GENERAL:alert, no distress and comfortable SKIN: skin color, texture, turgor are normal, no rashes or significant lesions EYES: normal, Conjunctiva are pink and non-injected, sclera clear OROPHARYNX:no exudate, no erythema and lips, buccal mucosa, and tongue normal  NECK: supple, thyroid normal size, non-tender, without  nodularity LYMPH:  no palpable lymphadenopathy in the cervical, axillary or inguinal LUNGS: clear to auscultation and percussion with normal breathing effort HEART: regular rate & rhythm and no murmurs and no lower extremity edema ABDOMEN:abdomen soft, non-tender and normal bowel sounds Musculoskeletal:no cyanosis of digits and no clubbing  NEURO: alert & oriented x 3 with fluent speech, no focal motor/sensory deficits  LABORATORY DATA:  I have reviewed the data as listed    Component Value Date/Time   NA 135 03/01/2021 0959   K 3.5 03/01/2021 0959   CL 94 (L) 03/01/2021 0959   CO2 29 03/01/2021 0959   GLUCOSE 198 (H) 03/01/2021 0959   BUN 7 (L) 03/01/2021 0959   CREATININE 0.67 03/01/2021 0959   CALCIUM 9.1 03/01/2021 0959   PROT 7.5 03/01/2021 0959   ALBUMIN 3.1 (L) 03/01/2021 0959   AST 26 03/01/2021 0959   ALT 20 03/01/2021 0959   ALKPHOS 90 03/01/2021 0959   BILITOT 0.5 03/01/2021 0959   GFRNONAA >60 03/01/2021 0959    No results found for: SPEP, UPEP  Lab Results  Component Value Date   WBC 6.0 03/01/2021   NEUTROABS 4.8 03/01/2021   HGB 11.5 (L) 03/01/2021   HCT 35.3 (L) 03/01/2021   MCV 84.0 03/01/2021   PLT 178 03/01/2021      Chemistry      Component Value Date/Time   NA 135 03/01/2021 0959   K 3.5 03/01/2021 0959   CL 94 (L) 03/01/2021 0959   CO2 29 03/01/2021 0959   BUN 7 (L) 03/01/2021 0959   CREATININE 0.67 03/01/2021 0959      Component Value Date/Time   CALCIUM 9.1 03/01/2021 0959   ALKPHOS 90 03/01/2021 0959   AST 26 03/01/2021 0959   ALT 20 03/01/2021 0959   BILITOT 0.5 03/01/2021 0959       RADIOGRAPHIC STUDIES: I have personally reviewed the radiological images as listed and agreed with the findings in the report. DG Chest 2 View  Result Date: 02/25/2021 CLINICAL DATA:  Shortness of breath.  History of pleural effusion. EXAM: CHEST - 2 VIEW COMPARISON:  Chest x-ray dated 02/08/2021 and chest CT dated 11/23/2020. FINDINGS: Borderline  cardiomegaly, stable. RIGHT chest wall Port-A-Cath is stable in position with tip at the expected level of the cavoatrial junction. LEFT lung is clear. Persistent opacity within the RIGHT lower lobe, corresponding to the mass/consolidation demonstrated on earlier chest CT of 11/23/2020 and subsequent PET CT of 12/08/2020. Small RIGHT pleural effusion IMPRESSION: 1. Persistent opacity within the RIGHT lower lobe, corresponding to the mass/consolidation demonstrated on  earlier chest CT of 11/23/2020 and subsequent PET-CT which described this finding as presumed pleural malignancy/metastasis. 2. Small RIGHT pleural effusion. 3. No new lung findings.  No acute findings. Electronically Signed   By: Franki Cabot M.D.   On: 02/25/2021 14:08   DG Chest 2 View  Result Date: 02/08/2021 CLINICAL DATA:  Hypoxia, malignant pleural effusion. EXAM: CHEST - 2 VIEW COMPARISON:  January 08, 2021. FINDINGS: The heart size and mediastinal contours are within normal limits. Interval placement of right internal jugular Port-A-Cath with distal tip in expected position of cavoatrial junction. No pneumothorax is noted. Stable right-sided pleural thickening is noted. Grossly stable small bilateral pleural effusions are noted. Stable right lung opacity is noted concerning for infiltrate or possibly atelectasis. The visualized skeletal structures are unremarkable. IMPRESSION: Interval placement of right internal jugular Port-A-Cath with distal tip in expected position of cavoatrial junction. Stable small bilateral pleural effusions. Stable right-sided pleural thickening. Electronically Signed   By: Marijo Conception M.D.   On: 02/08/2021 09:54   CT Angio Chest PE W and/or Wo Contrast  Result Date: 02/25/2021 CLINICAL DATA:  History of metastatic uterine cancer with malignant right pleural effusion, short of breath EXAM: CT ANGIOGRAPHY CHEST WITH CONTRAST TECHNIQUE: Multidetector CT imaging of the chest was performed using the standard  protocol during bolus administration of intravenous contrast. Multiplanar CT image reconstructions and MIPs were obtained to evaluate the vascular anatomy. CONTRAST:  160mL OMNIPAQUE IOHEXOL 350 MG/ML SOLN COMPARISON:  11/23/2020, 12/08/2020 FINDINGS: Cardiovascular: This is a technically adequate evaluation of the pulmonary vasculature. No filling defects or pulmonary emboli. The heart is unremarkable without pericardial effusion. No evidence of thoracic aortic aneurysm or dissection. Right chest wall port via internal jugular approach, tip within the superior vena cava. Mediastinum/Nodes: No enlarged mediastinal, hilar, or axillary lymph nodes. Thyroid gland, trachea, and esophagus demonstrate no significant findings. Lungs/Pleura: Diffuse right pleural thickening consistent with known pleural metastases. Loculated right pleural effusion is noted, not appreciably changed since prior PET scan. Minimal atelectasis within the right lower lobe. No acute airspace disease. No pneumothorax. The central airways are patent. Upper Abdomen: The hypodensities within the right lobe liver seen on previous studies are not as well visualized on this exam, may reflect decreased size due to response to therapy. The subcapsular hypodensity measures 2 cm on today's exam, previously having measured up to 4 cm. Trace Peri hepatic fluid under the right hemidiaphragm. Musculoskeletal: There is new sclerosis along the inferior endplate of the M42 vertebral body, without overlying bony destruction. This is nonspecific. No other acute or destructive bony lesions. Reconstructed images demonstrate no additional findings. Review of the MIP images confirms the above findings. IMPRESSION: 1. No evidence of pulmonary embolus. 2. Persistent right pleural thickening and loculated right pleural effusion, without significant change since previous PET scan. 3. Decreased prominence of the hypodensities along the superior aspect right lobe liver, likely  representing response to therapy. 4. Indeterminate sclerotic focus along the inferior endplate of the A83 vertebral body, new since prior study. This would be an unusual location for bony metastases, and may reflect developing Schmorl's node. Electronically Signed   By: Randa Ngo M.D.   On: 02/25/2021 17:32

## 2021-03-01 NOTE — Assessment & Plan Note (Signed)
She continues to have mild progressive weight loss but that was due to recent nausea She is eating better I will reduce the dose of her treatment a little bit

## 2021-03-01 NOTE — Patient Instructions (Signed)
[facility]COL[facility]  Discharge Instructions: Thank you for choosing [facility] to provide your oncology and hematology care.   If you have a lab appointment with the [facility], please go directly to the [facility] and check in at the registration area.   Wear comfortable clothing and clothing appropriate for easy access to any Portacath or PICC line.   We strive to give you quality time with your provider. You may need to reschedule your appointment if you arrive late (15 or more minutes).  Arriving late affects you and other patients whose appointments are after yours.  Also, if you miss three or more appointments without notifying the office, you may be dismissed from the clinic at the provider's discretion.      For prescription refill requests, have your pharmacy contact our office and allow 72 hours for refills to be completed.    Today you received the following chemotherapy and/or immunotherapy agents Taxol, carboplatin, and magnesium.    To help prevent nausea and vomiting after your treatment, we encourage you to take your nausea medication as directed.  BELOW ARE SYMPTOMS THAT SHOULD BE REPORTED IMMEDIATELY: . *FEVER GREATER THAN 100.4 F (38 C) OR HIGHER . *CHILLS OR SWEATING . *NAUSEA AND VOMITING THAT IS NOT CONTROLLED WITH YOUR NAUSEA MEDICATION . *UNUSUAL SHORTNESS OF BREATH . *UNUSUAL BRUISING OR BLEEDING . *URINARY PROBLEMS (pain or burning when urinating, or frequent urination) . *BOWEL PROBLEMS (unusual diarrhea, constipation, pain near the anus) . TENDERNESS IN MOUTH AND THROAT WITH OR WITHOUT PRESENCE OF ULCERS (sore throat, sores in mouth, or a toothache) . UNUSUAL RASH, SWELLING OR PAIN  . UNUSUAL VAGINAL DISCHARGE OR ITCHING   Items with * indicate a potential emergency and should be followed up as soon as possible or go to the Emergency Department if any problems should occur.  Please show the CHEMOTHERAPY ALERT CARD  or IMMUNOTHERAPY ALERT CARD at check-in to the Emergency Department and triage nurse.  Should you have questions after your visit or need to cancel or reschedule your appointment, please contact [facility]  Dept: [phone]  and follow the prompts.  Office hours are 8:00 a.m. to 4:30 p.m. Monday - Friday. Please note that voicemails left after 4:00 p.m. may not be returned until the following business day.  We are closed weekends and major holidays. You have access to a nurse at all times for urgent questions. Please call the main number to the clinic Dept: [phone] and follow the prompts.   For any non-urgent questions, you may also contact your provider using MyChart. We now offer e-Visits for anyone 11 and older to request care online for non-urgent symptoms. For details visit [email or web address].   Also download the MyChart app! Go to the app store, search "MyChart", open the app, select [facility], and log in with your MyChart username and password.  Due to Covid, a mask is required upon entering the hospital/clinic. If you do not have a mask, one will be given to you upon arrival. For doctor visits, patients may have 1 support person aged 93 or older with them. For treatment visits, patients cannot have anyone with them due to current Covid guidelines and our immunocompromised population.

## 2021-03-01 NOTE — Assessment & Plan Note (Signed)
Her recent CT imaging of the chest showed stable pleural effusion She does not need thoracentesis

## 2021-03-02 ENCOUNTER — Other Ambulatory Visit (HOSPITAL_COMMUNITY): Payer: Self-pay

## 2021-03-02 ENCOUNTER — Telehealth: Payer: Self-pay

## 2021-03-02 MED FILL — Prochlorperazine Maleate Tab 10 MG (Base Equivalent): ORAL | 6 days supply | Qty: 30 | Fill #0 | Status: CN

## 2021-03-02 NOTE — Telephone Encounter (Signed)
Pt called to verify Rx's that were called in yesterday by Dr Alvy Bimler, This LPN explained medications to pt. Pt verbalized thanks and understanding.

## 2021-03-05 ENCOUNTER — Telehealth: Payer: Self-pay

## 2021-03-05 NOTE — Telephone Encounter (Signed)
Patient aware, virtual visit requested.  Patient scheduled.

## 2021-03-05 NOTE — Telephone Encounter (Signed)
-----   Message from [name], MD sent at 03/05/2021  2:16 PM EDT ----- Regarding: RE: Medication Managment/Trouble Swallowing First time I heard about this. I can see her tomorrow at 1015 or virtual visit to discuss this ----- Message ----- From: Rennis Harding, RN Sent: 03/05/2021   1:43 PM EDT To: [name], MD Subject: Medication Managment/Trouble Swallowing        Patient was put on Magnesium Oxide recently and patient called to report she is having a hard time swallowing.   I educated her to use applesauce or pudding to take medication with, patient stated she tried that as well with no improvement.   Patient says she is having trouble swallowing even with food, and liquids.   Denies a sore throat, fever.  Does report phlegm, clear.   I'm unsure if this medication can be in a suspension form since it can't be crushed or chewed.  Any recommendations?

## 2021-03-06 ENCOUNTER — Inpatient Hospital Stay (HOSPITAL_BASED_OUTPATIENT_CLINIC_OR_DEPARTMENT_OTHER): Payer: Medicare Other | Admitting: Hematology and Oncology

## 2021-03-06 ENCOUNTER — Encounter: Payer: Self-pay | Admitting: Hematology and Oncology

## 2021-03-06 ENCOUNTER — Other Ambulatory Visit: Payer: Self-pay

## 2021-03-06 DIAGNOSIS — R1310 Dysphagia, unspecified: Secondary | ICD-10-CM | POA: Diagnosis not present

## 2021-03-06 DIAGNOSIS — C55 Malignant neoplasm of uterus, part unspecified: Secondary | ICD-10-CM | POA: Diagnosis not present

## 2021-03-06 DIAGNOSIS — K209 Esophagitis, unspecified without bleeding: Secondary | ICD-10-CM | POA: Diagnosis not present

## 2021-03-06 DIAGNOSIS — K5909 Other constipation: Secondary | ICD-10-CM | POA: Diagnosis not present

## 2021-03-06 NOTE — Assessment & Plan Note (Signed)
She has clinical signs and symptoms of esophagitis causing swallowing difficulties and reflux symptoms I went through all her medications at home She has problems swallowing certain medications Recommend discontinuation of aspirin I recommend the patient to increase Mylanta to twice a day and to take Tums twice a day She is not able to swallow Prilosec I will schedule another virtual visit next week to check on her If her symptoms does not improve, I will have to prescribe more aggressive treatment

## 2021-03-06 NOTE — Progress Notes (Signed)
HEMATOLOGY-ONCOLOGY ELECTRONIC VISIT PROGRESS NOTE  Patient Care Team: [name], FNP as PCP - General (Family Medicine) Awanda Mink Craige Cotta, RN as Oncology Nurse Navigator (Oncology)  I connected with the patient by telephone  ASSESSMENT & PLAN:  Uterine cancer [facility]) She tolerated recent chemotherapy well, complicated by slight constipation and esophagitis We will continue aggressive supportive care I plan to reduce premed steroids in her next visit  Esophagitis She has clinical signs and symptoms of esophagitis causing swallowing difficulties and reflux symptoms I went through all her medications at home She has problems swallowing certain medications Recommend discontinuation of aspirin I recommend the patient to increase Mylanta to twice a day and to take Tums twice a day She is not able to swallow Prilosec I will schedule another virtual visit next week to check on her If her symptoms does not improve, I will have to prescribe more aggressive treatment  Dysphagia She has pill dysphagia She also have difficulty swallowing certain food Based on what I gather, her symptoms are due to esophagitis She will continue frequent small meals I have made some changes to her medications and I am hopeful next week her symptoms will improve  Other constipation We have extensive discussions about the importance of aggressive laxative therapy   No orders of the defined types were placed in this encounter.   INTERVAL HISTORY: Please see below for problem oriented charting. The purpose of today's virtual visit is to assess new onset of dysphagia Since chemotherapy, she started to have problems swallowing her pills She is able to swallow most food and water but complain of esophageal pain and reflux She is also mildly constipated She denies recent nausea or vomiting I went through all her medications She was prescribed omeprazole several months ago but has not been taking it She  is not able to swallow amlodipine and her other medications  SUMMARY OF ONCOLOGIC HISTORY: Oncology History Overview Note  Adenocarcinoma with component of squamous differentiation and serous component  HER2 negative IHC MMR intact   Uterine cancer ([place])  11/23/2020 Pathology Results   FINAL MICROSCOPIC DIAGNOSIS:  - Malignant cells consistent with non-small cell carcinoma  - See comment   SPECIMEN ADEQUACY:  Satisfactory for evaluation   DIAGNOSTIC COMMENTS:  Morphologically, the malignant cells are suggestive of an adenocarcinoma.  However, immunohistochemical stains show that the tumor cells are positive for CK7 with patchy labeling for p63 and CK 5/6 whereas they are negative for TTF-1, Napsin-A, CK20, calretinin, D2-40, GATA3, ER and CDX2.  Ki-67 stain shows increased proliferative index. Positive staining for CK5/6 and p63 is suggestive of elements of squamous differentiation.   11/23/2020 Imaging   Ct chest 1. Near complete evacuation of the right pleural effusion after right-sided pigtail drainage catheter placement. Trace residual effusion is partially loculated. 2. Dense consolidation throughout the right lung, greatest at the right lung base. Findings are consistent with a combination of pneumonia and atelectasis. Follow-up imaging after completion of appropriate medical management is recommended to exclude underlying neoplasm. 3. Indeterminate hypodensity right lobe liver. If further evaluation is desired, dedicated liver CT or MRI may be useful.   12/08/2020 PET scan   1. Unusual pattern of hypermetabolic metastatic disease. 2. Intense metabolic activity associated with pleural thickening in the RIGHT hemithorax consistent with pleural metastasis versus primary pleural malignancy. 3. Isolated LEFT internal mammary nodal metastasis. 4. Intensely hypermetabolic lesion within the liver beneath the hypermetabolic lesions within the diaphragm. Differential includes local extension  of pleural metastasis,  hematogenous liver metastasis versus hepatic abscess. Favor hematogenous liver metastasis.  5. Hypermetabolic retroperitoneal metastatic adenopathy and internal iliac adenopathy. 6. Hypermetabolic thickening within the LEFT rectus muscle consistent with muscular skeletal metastasis. 7. Intense metabolic activity within enlarged uterus. Recommend gyn evaluation and potential MRI of the uterus to evaluate for primary uterine carcinoma. Unusual site of metastasis. Benign leiomyoma can be hypermetabolic but typically not this intense. 8. Rim of hypermetabolic activity in the vagina towards the introitus. Recommend evaluation as above.   12/20/2020 Initial Diagnosis   Uterine cancer ([place])   12/26/2020 Pathology Results   A. ENDOCERVIX, CURETTAGE:  - Scant benign squamous epithelium.   B. ENDOMETRIUM, BIOPSY:  - Adenocarcinoma, see comment.   COMMENT:   B. There are malignant glands some which have marked cytologic atypia. There if focal squamous differentiation. The tumor is positive for cytokeratin 7, PAX-8, GATA-3, ER (10% strong), p53, and WT-1. The cells are negative for cytokeratin 20, CDX-2, TTF-1, and NapsinA. The overall findings are consistent with a gynecologic adenocarcinoma. While squamous differentiation is most commonly seen in endometrioid/endocervical adenocarcinoma, the strong p53 staining and focal marked atypia is suggestive of a serous component.    01/03/2021 Cancer Staging   Staging form: Corpus Uteri - Carcinoma and Carcinosarcoma, AJCC 8th Edition - Clinical: FIGO Stage IVB (cT3b, cN2a, pM1) - Signed by [name], MD on 01/03/2021   01/16/2021 Procedure   Successful placement of a right internal jugular approach power injectable Port-A-Cath. The catheter is ready for immediate use.   01/17/2021 -  Chemotherapy    Patient is on Treatment Plan: UTERINE CARBOPLATIN AUC 6 / PACLITAXEL Q21D      02/25/2021 Imaging   1. No evidence of pulmonary embolus. 2.  Persistent right pleural thickening and loculated right pleural effusion, without significant change since previous PET scan. 3. Decreased prominence of the hypodensities along the superior aspect right lobe liver, likely representing response to therapy. 4. Indeterminate sclerotic focus along the inferior endplate of the P37 vertebral body, new since prior study. This would be an unusual location for bony metastases, and may reflect developing Schmorl's node.   Metastasis to liver ([place])  01/03/2021 Initial Diagnosis   Metastasis to liver ([place])   01/17/2021 -  Chemotherapy    Patient is on Treatment Plan: UTERINE CARBOPLATIN AUC 6 / PACLITAXEL Q21D        REVIEW OF SYSTEMS:   Constitutional: Denies fevers, chills or abnormal weight loss Eyes: Denies blurriness of vision Ears, nose, mouth, throat, and face: Denies mucositis or sore throat Respiratory: Denies cough, dyspnea or wheezes Cardiovascular: Denies palpitation, chest discomfort Skin: Denies abnormal skin rashes Lymphatics: Denies new lymphadenopathy or easy bruising Neurological:Denies numbness, tingling or new weaknesses Behavioral/Psych: Mood is stable, no new changes  Extremities: No lower extremity edema All other systems were reviewed with the patient and are negative.  I have reviewed the past medical history, past surgical history, social history and family history with the patient and they are unchanged from previous note.  ALLERGIES:  is allergic to eggs or egg-derived products, influenza vaccines, peanut-containing drug products, penicillins, sulfa antibiotics, and codeine.  MEDICATIONS:  Current Outpatient Medications  Medication Sig Dispense Refill  . calcium carbonate (TUMS - DOSED IN MG ELEMENTAL CALCIUM) 500 MG chewable tablet Chew 1 tablet by mouth in the morning and at bedtime.    . Alum & Mag Hydroxide-Simeth (MYLANTA PO) Take 1 Dose by mouth as needed (stomach upset, heartburn, indigestion.).    Marland Kitchen  carvedilol (COREG)  12.5 MG tablet Take 12.5 mg by mouth 2 (two) times daily with a meal.    . Cholecalciferol (VITAMIN D) 125 MCG (5000 UT) CAPS Take 5,000 Units by mouth daily.    Marland Kitchen dexamethasone (DECADRON) 4 MG tablet TAKE 2 TABLETS BY MOUTH NIGHT BEFORE AND 2 TABLETS IN THE MORNING OF CHEMO EVERY 3 WEEKS FOR 6 CYCLES 36 tablet 6  . FLUoxetine (PROZAC) 40 MG capsule TAKE 1 CAPSULE(40 MG) BY MOUTH DAILY (Patient taking differently: Take 40 mg by mouth daily.) 15 capsule 0  . HYDROmorphone (DILAUDID) 4 MG tablet TAKE 1 TABLET BY MOUTH EVERY 4 HOURS AS NEEDED FOR SEVERE PAIN. 60 tablet 0  . lidocaine (LIDODERM) 5 % Place 1 patch onto the skin daily. Remove & Discard patch within 12 hours or as directed by MD 30 patch 0  . lidocaine-prilocaine (EMLA) cream APPLY TO AFFECTED AREA ONCE AS DIRECTED (Patient taking differently: Apply 1 application topically once.) 30 g 3  . MELATONIN PO Take 10 mg by mouth at bedtime.    . ondansetron (ZOFRAN) 8 MG tablet TAKE 1 TABLET BY MOUTH EVERY 8 HOURS AS NEEDED START ON THE THIRD DAY AFTER CHEMO (Patient taking differently: Take 8 mg by mouth every 8 (eight) hours as needed for nausea or vomiting.) 30 tablet 1  . polyethylene glycol (MIRALAX / GLYCOLAX) 17 g packet Take 17 g by mouth daily.    . prochlorperazine (COMPAZINE) 10 MG tablet TAKE 1 TABLET BY MOUTH EVERY 6 HOURS AS NEEDED FOR NAUSEA OR VOMITING (Patient taking differently: Take 10 mg by mouth every 6 (six) hours as needed for nausea or vomiting.) 30 tablet 1   No current facility-administered medications for this visit.    PHYSICAL EXAMINATION: ECOG PERFORMANCE STATUS: 2 - Symptomatic, <50% confined to bed  LABORATORY DATA:  I have reviewed the data as listed CMP Latest Ref Rng & Units 03/01/2021 02/25/2021 02/08/2021  Glucose 70 - 99 mg/dL 198(H) 122(H) 160(H)  BUN 8 - 23 mg/dL 7(L) 6(L) 6(L)  Creatinine 0.44 - 1.00 mg/dL 0.67 0.52 0.57  Sodium 135 - 145 mmol/L 135 137 138  Potassium 3.5 - 5.1 mmol/L  3.5 2.5(LL) 3.2(L)  Chloride 98 - 111 mmol/L 94(L) 95(L) 92(L)  CO2 22 - 32 mmol/L _0 Calcium 8.9 - 10.3 mg/dL 9.1 9.3 9.2  Total Protein 6.5 - 8.1 g/dL 7.5 - 7.1  Total Bilirubin 0.3 - 1.2 mg/dL 0.5 - 0.5  Alkaline Phos 38 - 126 U/L 90 - 92  AST 15 - 41 U/L 26 - 15  ALT 0 - 44 U/L 20 - 9    Lab Results  Component Value Date   WBC 6.0 03/01/2021   HGB 11.5 (L) 03/01/2021   HCT 35.3 (L) 03/01/2021   MCV 84.0 03/01/2021   PLT 178 03/01/2021   NEUTROABS 4.8 03/01/2021     RADIOGRAPHIC STUDIES: I have personally reviewed the radiological images as listed and agreed with the findings in the report. DG Chest 2 View  Result Date: 02/25/2021 CLINICAL DATA:  Shortness of breath.  History of pleural effusion. EXAM: CHEST - 2 VIEW COMPARISON:  Chest x-ray dated 02/08/2021 and chest CT dated 11/23/2020. FINDINGS: Borderline cardiomegaly, stable. RIGHT chest wall Port-A-Cath is stable in position with tip at the expected level of the cavoatrial junction. LEFT lung is clear. Persistent opacity within the RIGHT lower lobe, corresponding to the mass/consolidation demonstrated on earlier chest CT of 11/23/2020 and subsequent PET CT of 12/08/2020. Small RIGHT  pleural effusion IMPRESSION: 1. Persistent opacity within the RIGHT lower lobe, corresponding to the mass/consolidation demonstrated on earlier chest CT of 11/23/2020 and subsequent PET-CT which described this finding as presumed pleural malignancy/metastasis. 2. Small RIGHT pleural effusion. 3. No new lung findings.  No acute findings. Electronically Signed   By: Franki Cabot M.D.   On: 02/25/2021 14:08   DG Chest 2 View  Result Date: 02/08/2021 CLINICAL DATA:  Hypoxia, malignant pleural effusion. EXAM: CHEST - 2 VIEW COMPARISON:  January 08, 2021. FINDINGS: The heart size and mediastinal contours are within normal limits. Interval placement of right internal jugular Port-A-Cath with distal tip in expected position of cavoatrial junction. No  pneumothorax is noted. Stable right-sided pleural thickening is noted. Grossly stable small bilateral pleural effusions are noted. Stable right lung opacity is noted concerning for infiltrate or possibly atelectasis. The visualized skeletal structures are unremarkable. IMPRESSION: Interval placement of right internal jugular Port-A-Cath with distal tip in expected position of cavoatrial junction. Stable small bilateral pleural effusions. Stable right-sided pleural thickening. Electronically Signed   By: Marijo Conception M.D.   On: 02/08/2021 09:54   CT Angio Chest PE W and/or Wo Contrast  Result Date: 02/25/2021 CLINICAL DATA:  History of metastatic uterine cancer with malignant right pleural effusion, short of breath EXAM: CT ANGIOGRAPHY CHEST WITH CONTRAST TECHNIQUE: Multidetector CT imaging of the chest was performed using the standard protocol during bolus administration of intravenous contrast. Multiplanar CT image reconstructions and MIPs were obtained to evaluate the vascular anatomy. CONTRAST:  158m OMNIPAQUE IOHEXOL 350 MG/ML SOLN COMPARISON:  11/23/2020, 12/08/2020 FINDINGS: Cardiovascular: This is a technically adequate evaluation of the pulmonary vasculature. No filling defects or pulmonary emboli. The heart is unremarkable without pericardial effusion. No evidence of thoracic aortic aneurysm or dissection. Right chest wall port via internal jugular approach, tip within the superior vena cava. Mediastinum/Nodes: No enlarged mediastinal, hilar, or axillary lymph nodes. Thyroid gland, trachea, and esophagus demonstrate no significant findings. Lungs/Pleura: Diffuse right pleural thickening consistent with known pleural metastases. Loculated right pleural effusion is noted, not appreciably changed since prior PET scan. Minimal atelectasis within the right lower lobe. No acute airspace disease. No pneumothorax. The central airways are patent. Upper Abdomen: The hypodensities within the right lobe liver  seen on previous studies are not as well visualized on this exam, may reflect decreased size due to response to therapy. The subcapsular hypodensity measures 2 cm on today's exam, previously having measured up to 4 cm. Trace Peri hepatic fluid under the right hemidiaphragm. Musculoskeletal: There is new sclerosis along the inferior endplate of the TB51vertebral body, without overlying bony destruction. This is nonspecific. No other acute or destructive bony lesions. Reconstructed images demonstrate no additional findings. Review of the MIP images confirms the above findings. IMPRESSION: 1. No evidence of pulmonary embolus. 2. Persistent right pleural thickening and loculated right pleural effusion, without significant change since previous PET scan. 3. Decreased prominence of the hypodensities along the superior aspect right lobe liver, likely representing response to therapy. 4. Indeterminate sclerotic focus along the inferior endplate of the TW25vertebral body, new since prior study. This would be an unusual location for bony metastases, and may reflect developing Schmorl's node. Electronically Signed   By: MRanda NgoM.D.   On: 02/25/2021 17:32    I discussed the assessment and treatment plan with the patient. The patient was provided an opportunity to ask questions and all were answered. The patient agreed with the plan and demonstrated an  understanding of the instructions. The patient was advised to call back or seek an in-person evaluation if the symptoms worsen or if the condition fails to improve as anticipated.    I spent 20 minutes for the appointment reviewing test results, discuss management and coordination of care.  [name], MD 03/06/2021 10:49 AM

## 2021-03-06 NOTE — Assessment & Plan Note (Signed)
We have extensive discussions about the importance of aggressive laxative therapy

## 2021-03-06 NOTE — Assessment & Plan Note (Signed)
She has pill dysphagia She also have difficulty swallowing certain food Based on what I gather, her symptoms are due to esophagitis She will continue frequent small meals I have made some changes to her medications and I am hopeful next week her symptoms will improve

## 2021-03-06 NOTE — Assessment & Plan Note (Signed)
She tolerated recent chemotherapy well, complicated by slight constipation and esophagitis We will continue aggressive supportive care I plan to reduce premed steroids in her next visit

## 2021-03-07 ENCOUNTER — Other Ambulatory Visit (HOSPITAL_COMMUNITY): Payer: Self-pay

## 2021-03-08 ENCOUNTER — Other Ambulatory Visit (HOSPITAL_COMMUNITY): Payer: Self-pay

## 2021-03-10 ENCOUNTER — Other Ambulatory Visit (HOSPITAL_COMMUNITY): Payer: Self-pay

## 2021-03-12 ENCOUNTER — Other Ambulatory Visit (HOSPITAL_COMMUNITY): Payer: Self-pay

## 2021-03-12 MED ORDER — FLUOXETINE HCL 20 MG/5ML PO SOLN
ORAL | 3 refills | Status: DC
  Filled 2021-03-12 – 2021-03-13 (×2): qty 300, 30d supply, fill #0

## 2021-03-12 MED ORDER — FAMOTIDINE 40 MG/5ML PO SUSR
ORAL | 3 refills | Status: DC
  Filled 2021-03-12 – 2021-03-13 (×2): qty 150, 30d supply, fill #0

## 2021-03-12 MED ORDER — TRAZODONE HCL 50 MG PO TABS
ORAL_TABLET | ORAL | 0 refills | Status: DC
  Filled 2021-03-12: qty 90, 90d supply, fill #0

## 2021-03-13 ENCOUNTER — Inpatient Hospital Stay (HOSPITAL_BASED_OUTPATIENT_CLINIC_OR_DEPARTMENT_OTHER): Payer: Medicare Other | Admitting: Hematology and Oncology

## 2021-03-13 ENCOUNTER — Encounter: Payer: Self-pay | Admitting: Hematology and Oncology

## 2021-03-13 ENCOUNTER — Other Ambulatory Visit: Payer: Self-pay

## 2021-03-13 ENCOUNTER — Other Ambulatory Visit (HOSPITAL_COMMUNITY): Payer: Self-pay

## 2021-03-13 DIAGNOSIS — C55 Malignant neoplasm of uterus, part unspecified: Secondary | ICD-10-CM | POA: Diagnosis not present

## 2021-03-13 DIAGNOSIS — G893 Neoplasm related pain (acute) (chronic): Secondary | ICD-10-CM

## 2021-03-13 DIAGNOSIS — G4700 Insomnia, unspecified: Secondary | ICD-10-CM

## 2021-03-13 DIAGNOSIS — R1310 Dysphagia, unspecified: Secondary | ICD-10-CM

## 2021-03-13 DIAGNOSIS — K209 Esophagitis, unspecified without bleeding: Secondary | ICD-10-CM | POA: Diagnosis not present

## 2021-03-13 MED ORDER — HYDROMORPHONE HCL 4 MG PO TABS
ORAL_TABLET | ORAL | 0 refills | Status: DC | PRN
  Filled 2021-03-13: qty 60, 10d supply, fill #0

## 2021-03-13 MED FILL — Ondansetron HCl Tab 8 MG: ORAL | 10 days supply | Qty: 30 | Fill #0 | Status: AC

## 2021-03-13 NOTE — Assessment & Plan Note (Addendum)
Overall, she is not doing well I have reviewed documentation by her primary care doctor from her office visit note yesterday She have lost another 10 pounds since last time she was seen here due to difficulties with dysphagia Her primary care doctor has switched most of her medications into a liquid form and she felt better today I will see her again as scheduled next week for her next cycle of treatment She will require significant dose adjustment due to her weight loss With her mucositis and esophagitis, I plan to reduce the dose of steroids and her chemotherapy dose for both.  I plan to reduce carboplatin to AUC of 5 and Taxol dose further

## 2021-03-13 NOTE — Assessment & Plan Note (Signed)
Her right upper quadrant pain is stable with current prescribed Dilaudid Per patient request, I have refilled her prescription today

## 2021-03-13 NOTE — Assessment & Plan Note (Signed)
His dysphagia has improved somewhat I recommend frequent small meals

## 2021-03-13 NOTE — Assessment & Plan Note (Signed)
This is multifactorial I do not recommend additional medication because she have trouble swallowing medications already

## 2021-03-13 NOTE — Progress Notes (Signed)
HEMATOLOGY-ONCOLOGY ELECTRONIC VISIT PROGRESS NOTE  Patient Care Team: [name], FNP as PCP - General (Family Medicine) Awanda Mink Craige Cotta, RN as Oncology Nurse Navigator (Oncology)  I connected with  the patient through telephone call  ASSESSMENT & PLAN:  Uterine cancer ([place]) Overall, she is not doing well I have reviewed documentation by her primary care doctor from her office visit note yesterday She have lost another 10 pounds since last time she was seen here due to difficulties with dysphagia Her primary care doctor has switched most of her medications into a liquid form and she felt better today I will see her again as scheduled next week for her next cycle of treatment She will require significant dose adjustment due to her weight loss With her mucositis and esophagitis, I plan to reduce the dose of steroids and her chemotherapy dose for both.  I plan to reduce carboplatin to AUC of 5 and Taxol dose further  Dysphagia His dysphagia has improved somewhat I recommend frequent small meals  Esophagitis Her esophagitis has resolved/improved She will continue the antiacid medicine as directed  Cancer associated pain Her right upper quadrant pain is stable with current prescribed Dilaudid Per patient request, I have refilled her prescription today  Insomnia This is multifactorial I do not recommend additional medication because she have trouble swallowing medications already   No orders of the defined types were placed in this encounter.   INTERVAL HISTORY: Please see below for problem oriented charting. The purpose of today's visit is to follow-up on her symptoms of dysphagia and esophagitis I noticed that the patient was seen by her primary care doctor yesterday I reviewed her documentation Several of her medications were switched to liquid form Overall, her symptoms of dysphagia has improved She is able to swallow majority of her medications as well as food  and water Her right upper quadrant pain is stable Overall, she is weak and has not moved around much She has difficulties sleeping at night but during daytime, she is very sedentary  SUMMARY OF ONCOLOGIC HISTORY: Oncology History Overview Note  Adenocarcinoma with component of squamous differentiation and serous component  HER2 negative IHC MMR intact   Uterine cancer ([place])  11/23/2020 Pathology Results   FINAL MICROSCOPIC DIAGNOSIS:  - Malignant cells consistent with non-small cell carcinoma  - See comment   SPECIMEN ADEQUACY:  Satisfactory for evaluation   DIAGNOSTIC COMMENTS:  Morphologically, the malignant cells are suggestive of an adenocarcinoma.  However, immunohistochemical stains show that the tumor cells are positive for CK7 with patchy labeling for p63 and CK 5/6 whereas they are negative for TTF-1, Napsin-A, CK20, calretinin, D2-40, GATA3, ER and CDX2.  Ki-67 stain shows increased proliferative index. Positive staining for CK5/6 and p63 is suggestive of elements of squamous differentiation.   11/23/2020 Imaging   Ct chest 1. Near complete evacuation of the right pleural effusion after right-sided pigtail drainage catheter placement. Trace residual effusion is partially loculated. 2. Dense consolidation throughout the right lung, greatest at the right lung base. Findings are consistent with a combination of pneumonia and atelectasis. Follow-up imaging after completion of appropriate medical management is recommended to exclude underlying neoplasm. 3. Indeterminate hypodensity right lobe liver. If further evaluation is desired, dedicated liver CT or MRI may be useful.   12/08/2020 PET scan   1. Unusual pattern of hypermetabolic metastatic disease. 2. Intense metabolic activity associated with pleural thickening in the RIGHT hemithorax consistent with pleural metastasis versus primary pleural malignancy. 3. Isolated LEFT  internal mammary nodal metastasis. 4. Intensely  hypermetabolic lesion within the liver beneath the hypermetabolic lesions within the diaphragm. Differential includes local extension of pleural metastasis, hematogenous liver metastasis versus hepatic abscess. Favor hematogenous liver metastasis.  5. Hypermetabolic retroperitoneal metastatic adenopathy and internal iliac adenopathy. 6. Hypermetabolic thickening within the LEFT rectus muscle consistent with muscular skeletal metastasis. 7. Intense metabolic activity within enlarged uterus. Recommend gyn evaluation and potential MRI of the uterus to evaluate for primary uterine carcinoma. Unusual site of metastasis. Benign leiomyoma can be hypermetabolic but typically not this intense. 8. Rim of hypermetabolic activity in the vagina towards the introitus. Recommend evaluation as above.   12/20/2020 Initial Diagnosis   Uterine cancer ([place])   12/26/2020 Pathology Results   A. ENDOCERVIX, CURETTAGE:  - Scant benign squamous epithelium.   B. ENDOMETRIUM, BIOPSY:  - Adenocarcinoma, see comment.   COMMENT:   B. There are malignant glands some which have marked cytologic atypia. There if focal squamous differentiation. The tumor is positive for cytokeratin 7, PAX-8, GATA-3, ER (10% strong), p53, and WT-1. The cells are negative for cytokeratin 20, CDX-2, TTF-1, and NapsinA. The overall findings are consistent with a gynecologic adenocarcinoma. While squamous differentiation is most commonly seen in endometrioid/endocervical adenocarcinoma, the strong p53 staining and focal marked atypia is suggestive of a serous component.    01/03/2021 Cancer Staging   Staging form: Corpus Uteri - Carcinoma and Carcinosarcoma, AJCC 8th Edition - Clinical: FIGO Stage IVB (cT3b, cN2a, pM1) - Signed by [name], MD on 01/03/2021   01/16/2021 Procedure   Successful placement of a right internal jugular approach power injectable Port-A-Cath. The catheter is ready for immediate use.   01/17/2021 -  Chemotherapy    Patient  is on Treatment Plan: UTERINE CARBOPLATIN AUC 6 / PACLITAXEL Q21D      02/25/2021 Imaging   1. No evidence of pulmonary embolus. 2. Persistent right pleural thickening and loculated right pleural effusion, without significant change since previous PET scan. 3. Decreased prominence of the hypodensities along the superior aspect right lobe liver, likely representing response to therapy. 4. Indeterminate sclerotic focus along the inferior endplate of the Z56 vertebral body, new since prior study. This would be an unusual location for bony metastases, and may reflect developing Schmorl's node.   Metastasis to liver ([place])  01/03/2021 Initial Diagnosis   Metastasis to liver ([place])   01/17/2021 -  Chemotherapy    Patient is on Treatment Plan: UTERINE CARBOPLATIN AUC 6 / PACLITAXEL Q21D        REVIEW OF SYSTEMS:   Constitutional: Denies fevers, chills or abnormal weight loss Eyes: Denies blurriness of vision Ears, nose, mouth, throat, and face: Denies mucositis or sore throat Respiratory: Denies cough, dyspnea or wheezes Cardiovascular: Denies palpitation, chest discomfort Gastrointestinal:  Denies nausea, heartburn or change in bowel habits Skin: Denies abnormal skin rashes Lymphatics: Denies new lymphadenopathy or easy bruising Neurological:Denies numbness, tingling or new weaknesses Behavioral/Psych: Mood is stable, no new changes  Extremities: No lower extremity edema All other systems were reviewed with the patient and are negative.  I have reviewed the past medical history, past surgical history, social history and family history with the patient and they are unchanged from previous note.  ALLERGIES:  is allergic to eggs or egg-derived products, influenza vaccines, peanut-containing drug products, penicillins, sulfa antibiotics, and codeine.  MEDICATIONS:  Current Outpatient Medications  Medication Sig Dispense Refill  . Alum & Mag Hydroxide-Simeth (MYLANTA PO) Take 1 Dose by mouth  as needed (stomach upset,  heartburn, indigestion.).    Marland Kitchen calcium carbonate (TUMS - DOSED IN MG ELEMENTAL CALCIUM) 500 MG chewable tablet Chew 1 tablet by mouth in the morning and at bedtime.    . carvedilol (COREG) 12.5 MG tablet Take 12.5 mg by mouth 2 (two) times daily with a meal.    . Cholecalciferol (VITAMIN D) 125 MCG (5000 UT) CAPS Take 5,000 Units by mouth daily.    Marland Kitchen dexamethasone (DECADRON) 4 MG tablet TAKE 2 TABLETS BY MOUTH NIGHT BEFORE AND 2 TABLETS IN THE MORNING OF CHEMO EVERY 3 WEEKS FOR 6 CYCLES 36 tablet 6  . famotidine (PEPCID) 40 MG/5ML suspension Take 2.5 mLs (20 mg total) by mouth 2 (two) times daily as needed (Heartburn). 150 mL 3  . FLUoxetine (PROZAC) 20 MG/5ML solution Take 10 mLs (40 mg total) by mouth daily. 300 mL 3  . HYDROmorphone (DILAUDID) 4 MG tablet TAKE 1 TABLET BY MOUTH EVERY 4 HOURS AS NEEDED FOR SEVERE PAIN. 60 tablet 0  . lidocaine (LIDODERM) 5 % Place 1 patch onto the skin daily. Remove & Discard patch within 12 hours or as directed by MD 30 patch 0  . lidocaine-prilocaine (EMLA) cream APPLY TO AFFECTED AREA ONCE AS DIRECTED (Patient taking differently: Apply 1 application topically once.) 30 g 3  . MELATONIN PO Take 10 mg by mouth at bedtime.    . ondansetron (ZOFRAN) 8 MG tablet TAKE 1 TABLET BY MOUTH EVERY 8 HOURS AS NEEDED START ON THE THIRD DAY AFTER CHEMO (Patient taking differently: Take 8 mg by mouth every 8 (eight) hours as needed for nausea or vomiting.) 30 tablet 1  . polyethylene glycol (MIRALAX / GLYCOLAX) 17 g packet Take 17 g by mouth daily.    . prochlorperazine (COMPAZINE) 10 MG tablet TAKE 1 TABLET BY MOUTH EVERY 6 HOURS AS NEEDED FOR NAUSEA OR VOMITING (Patient taking differently: Take 10 mg by mouth every 6 (six) hours as needed for nausea or vomiting.) 30 tablet 1  . traZODone (DESYREL) 50 MG tablet Take 1 tablet (50 mg total) by mouth nightly. 90 tablet 0   No current facility-administered medications for this visit.    PHYSICAL  EXAMINATION: ECOG PERFORMANCE STATUS: 2 - Symptomatic, <50% confined to bed  LABORATORY DATA:  I have reviewed the data as listed CMP Latest Ref Rng & Units 03/01/2021 02/25/2021 02/08/2021  Glucose 70 - 99 mg/dL 198(H) 122(H) 160(H)  BUN 8 - 23 mg/dL 7(L) 6(L) 6(L)  Creatinine 0.44 - 1.00 mg/dL 0.67 0.52 0.57  Sodium 135 - 145 mmol/L 135 137 138  Potassium 3.5 - 5.1 mmol/L 3.5 2.5(LL) 3.2(L)  Chloride 98 - 111 mmol/L 94(L) 95(L) 92(L)  CO2 22 - 32 mmol/L _0 Calcium 8.9 - 10.3 mg/dL 9.1 9.3 9.2  Total Protein 6.5 - 8.1 g/dL 7.5 - 7.1  Total Bilirubin 0.3 - 1.2 mg/dL 0.5 - 0.5  Alkaline Phos 38 - 126 U/L 90 - 92  AST 15 - 41 U/L 26 - 15  ALT 0 - 44 U/L 20 - 9    Lab Results  Component Value Date   WBC 6.0 03/01/2021   HGB 11.5 (L) 03/01/2021   HCT 35.3 (L) 03/01/2021   MCV 84.0 03/01/2021   PLT 178 03/01/2021   NEUTROABS 4.8 03/01/2021     RADIOGRAPHIC STUDIES: I have personally reviewed the radiological images as listed and agreed with the findings in the report. DG Chest 2 View  Result Date: 02/25/2021 CLINICAL DATA:  Shortness of breath.  History of pleural effusion. EXAM: CHEST - 2 VIEW COMPARISON:  Chest x-ray dated 02/08/2021 and chest CT dated 11/23/2020. FINDINGS: Borderline cardiomegaly, stable. RIGHT chest wall Port-A-Cath is stable in position with tip at the expected level of the cavoatrial junction. LEFT lung is clear. Persistent opacity within the RIGHT lower lobe, corresponding to the mass/consolidation demonstrated on earlier chest CT of 11/23/2020 and subsequent PET CT of 12/08/2020. Small RIGHT pleural effusion IMPRESSION: 1. Persistent opacity within the RIGHT lower lobe, corresponding to the mass/consolidation demonstrated on earlier chest CT of 11/23/2020 and subsequent PET-CT which described this finding as presumed pleural malignancy/metastasis. 2. Small RIGHT pleural effusion. 3. No new lung findings.  No acute findings. Electronically Signed   By: Franki Cabot M.D.   On: 02/25/2021 14:08   CT Angio Chest PE W and/or Wo Contrast  Result Date: 02/25/2021 CLINICAL DATA:  History of metastatic uterine cancer with malignant right pleural effusion, short of breath EXAM: CT ANGIOGRAPHY CHEST WITH CONTRAST TECHNIQUE: Multidetector CT imaging of the chest was performed using the standard protocol during bolus administration of intravenous contrast. Multiplanar CT image reconstructions and MIPs were obtained to evaluate the vascular anatomy. CONTRAST:  125m OMNIPAQUE IOHEXOL 350 MG/ML SOLN COMPARISON:  11/23/2020, 12/08/2020 FINDINGS: Cardiovascular: This is a technically adequate evaluation of the pulmonary vasculature. No filling defects or pulmonary emboli. The heart is unremarkable without pericardial effusion. No evidence of thoracic aortic aneurysm or dissection. Right chest wall port via internal jugular approach, tip within the superior vena cava. Mediastinum/Nodes: No enlarged mediastinal, hilar, or axillary lymph nodes. Thyroid gland, trachea, and esophagus demonstrate no significant findings. Lungs/Pleura: Diffuse right pleural thickening consistent with known pleural metastases. Loculated right pleural effusion is noted, not appreciably changed since prior PET scan. Minimal atelectasis within the right lower lobe. No acute airspace disease. No pneumothorax. The central airways are patent. Upper Abdomen: The hypodensities within the right lobe liver seen on previous studies are not as well visualized on this exam, may reflect decreased size due to response to therapy. The subcapsular hypodensity measures 2 cm on today's exam, previously having measured up to 4 cm. Trace Peri hepatic fluid under the right hemidiaphragm. Musculoskeletal: There is new sclerosis along the inferior endplate of the TG26vertebral body, without overlying bony destruction. This is nonspecific. No other acute or destructive bony lesions. Reconstructed images demonstrate no additional  findings. Review of the MIP images confirms the above findings. IMPRESSION: 1. No evidence of pulmonary embolus. 2. Persistent right pleural thickening and loculated right pleural effusion, without significant change since previous PET scan. 3. Decreased prominence of the hypodensities along the superior aspect right lobe liver, likely representing response to therapy. 4. Indeterminate sclerotic focus along the inferior endplate of the TR48vertebral body, new since prior study. This would be an unusual location for bony metastases, and may reflect developing Schmorl's node. Electronically Signed   By: MRanda NgoM.D.   On: 02/25/2021 17:32    I discussed the assessment and treatment plan with the patient. The patient was provided an opportunity to ask questions and all were answered. The patient agreed with the plan and demonstrated an understanding of the instructions. The patient was advised to call back or seek an in-person evaluation if the symptoms worsen or if the condition fails to improve as anticipated.    I spent 25 minutes for the appointment reviewing test results, discuss management and coordination of care.  NHeath Lark MD 03/13/2021 12:34 PM

## 2021-03-13 NOTE — Assessment & Plan Note (Signed)
Her esophagitis has resolved/improved She will continue the antiacid medicine as directed

## 2021-03-20 ENCOUNTER — Other Ambulatory Visit (HOSPITAL_COMMUNITY): Payer: Self-pay

## 2021-03-21 ENCOUNTER — Telehealth: Payer: Self-pay

## 2021-03-21 NOTE — Telephone Encounter (Signed)
Pt called and LVM stating she needs a call back; left no details. This LPN attempted to call pt, no answer and no VM set up.

## 2021-03-22 ENCOUNTER — Other Ambulatory Visit: Payer: Self-pay

## 2021-03-22 ENCOUNTER — Encounter: Payer: Self-pay | Admitting: Hematology and Oncology

## 2021-03-22 ENCOUNTER — Other Ambulatory Visit (HOSPITAL_COMMUNITY): Payer: Self-pay

## 2021-03-22 ENCOUNTER — Inpatient Hospital Stay: Payer: Medicare Other

## 2021-03-22 ENCOUNTER — Other Ambulatory Visit: Payer: Self-pay | Admitting: Hematology and Oncology

## 2021-03-22 ENCOUNTER — Inpatient Hospital Stay (HOSPITAL_BASED_OUTPATIENT_CLINIC_OR_DEPARTMENT_OTHER): Payer: Medicare Other | Admitting: Hematology and Oncology

## 2021-03-22 VITALS — BP 114/73 | HR 74 | Temp 97.7°F | Resp 18 | Ht 66.0 in | Wt 169.6 lb

## 2021-03-22 DIAGNOSIS — C55 Malignant neoplasm of uterus, part unspecified: Secondary | ICD-10-CM | POA: Diagnosis not present

## 2021-03-22 DIAGNOSIS — G893 Neoplasm related pain (acute) (chronic): Secondary | ICD-10-CM | POA: Diagnosis not present

## 2021-03-22 DIAGNOSIS — K5909 Other constipation: Secondary | ICD-10-CM

## 2021-03-22 DIAGNOSIS — R634 Abnormal weight loss: Secondary | ICD-10-CM

## 2021-03-22 DIAGNOSIS — E876 Hypokalemia: Secondary | ICD-10-CM

## 2021-03-22 DIAGNOSIS — J910 Malignant pleural effusion: Secondary | ICD-10-CM

## 2021-03-22 DIAGNOSIS — G4700 Insomnia, unspecified: Secondary | ICD-10-CM

## 2021-03-22 DIAGNOSIS — C787 Secondary malignant neoplasm of liver and intrahepatic bile duct: Secondary | ICD-10-CM

## 2021-03-22 DIAGNOSIS — Z5111 Encounter for antineoplastic chemotherapy: Secondary | ICD-10-CM | POA: Diagnosis not present

## 2021-03-22 DIAGNOSIS — K209 Esophagitis, unspecified without bleeding: Secondary | ICD-10-CM

## 2021-03-22 LAB — CBC WITH DIFFERENTIAL (CANCER CENTER ONLY)
Abs Immature Granulocytes: 0.12 10*3/uL — ABNORMAL HIGH (ref 0.00–0.07)
Basophils Absolute: 0 10*3/uL (ref 0.0–0.1)
Basophils Relative: 0 %
Eosinophils Absolute: 0 10*3/uL (ref 0.0–0.5)
Eosinophils Relative: 0 %
HCT: 32.5 % — ABNORMAL LOW (ref 36.0–46.0)
Hemoglobin: 11.1 g/dL — ABNORMAL LOW (ref 12.0–15.0)
Immature Granulocytes: 2 %
Lymphocytes Relative: 20 %
Lymphs Abs: 1.2 10*3/uL (ref 0.7–4.0)
MCH: 28.1 pg (ref 26.0–34.0)
MCHC: 34.2 g/dL (ref 30.0–36.0)
MCV: 82.3 fL (ref 80.0–100.0)
Monocytes Absolute: 0.2 10*3/uL (ref 0.1–1.0)
Monocytes Relative: 3 %
Neutro Abs: 4.4 10*3/uL (ref 1.7–7.7)
Neutrophils Relative %: 75 %
Platelet Count: 153 10*3/uL (ref 150–400)
RBC: 3.95 MIL/uL (ref 3.87–5.11)
RDW: 16 % — ABNORMAL HIGH (ref 11.5–15.5)
WBC Count: 5.9 10*3/uL (ref 4.0–10.5)
nRBC: 0 % (ref 0.0–0.2)

## 2021-03-22 LAB — CMP (CANCER CENTER ONLY)
ALT: 34 U/L (ref 0–44)
AST: 33 U/L (ref 15–41)
Albumin: 3 g/dL — ABNORMAL LOW (ref 3.5–5.0)
Alkaline Phosphatase: 72 U/L (ref 38–126)
Anion gap: 16 — ABNORMAL HIGH (ref 5–15)
BUN: 10 mg/dL (ref 8–23)
CO2: 32 mmol/L (ref 22–32)
Calcium: 9.5 mg/dL (ref 8.9–10.3)
Chloride: 91 mmol/L — ABNORMAL LOW (ref 98–111)
Creatinine: 0.71 mg/dL (ref 0.44–1.00)
GFR, Estimated: >60 mL/min (ref >60)
Glucose, Bld: 170 mg/dL — ABNORMAL HIGH (ref 70–99)
Potassium: 2.9 mmol/L — ABNORMAL LOW (ref 3.5–5.1)
Sodium: 139 mmol/L (ref 135–145)
Total Bilirubin: 0.5 mg/dL (ref 0.3–1.2)
Total Protein: 7.2 g/dL (ref 6.5–8.1)

## 2021-03-22 LAB — MAGNESIUM: Magnesium: 1.5 mg/dL — ABNORMAL LOW (ref 1.7–2.4)

## 2021-03-22 MED ORDER — DEXAMETHASONE 4 MG PO TABS
ORAL_TABLET | ORAL | 6 refills | Status: AC
  Filled 2021-03-22: qty 16, 84d supply, fill #0

## 2021-03-22 MED ORDER — PACLITAXEL CHEMO INJECTION 300 MG/50ML
140.0000 mg/m2 | Freq: Once | INTRAVENOUS | Status: AC
  Administered 2021-03-22: 264 mg via INTRAVENOUS
  Filled 2021-03-22: qty 44

## 2021-03-22 MED ORDER — HEPARIN SOD (PORK) LOCK FLUSH 100 UNIT/ML IV SOLN
500.0000 [IU] | Freq: Once | INTRAVENOUS | Status: AC | PRN
  Administered 2021-03-22: 500 [IU]
  Filled 2021-03-22: qty 5

## 2021-03-22 MED ORDER — DEXAMETHASONE SODIUM PHOSPHATE 10 MG/ML IJ SOLN
INTRAMUSCULAR | Status: AC
  Filled 2021-03-22: qty 1

## 2021-03-22 MED ORDER — FAMOTIDINE 20 MG IN NS 100 ML IVPB
INTRAVENOUS | Status: AC
  Filled 2021-03-22: qty 100

## 2021-03-22 MED ORDER — SODIUM CHLORIDE 0.9% FLUSH
10.0000 mL | INTRAVENOUS | Status: DC | PRN
  Administered 2021-03-22: 10 mL
  Filled 2021-03-22: qty 10

## 2021-03-22 MED ORDER — DIPHENHYDRAMINE HCL 50 MG/ML IJ SOLN
25.0000 mg | Freq: Once | INTRAMUSCULAR | Status: AC
  Administered 2021-03-22: 25 mg via INTRAVENOUS

## 2021-03-22 MED ORDER — PALONOSETRON HCL INJECTION 0.25 MG/5ML
INTRAVENOUS | Status: AC
  Filled 2021-03-22: qty 5

## 2021-03-22 MED ORDER — DIPHENHYDRAMINE HCL 50 MG/ML IJ SOLN
INTRAMUSCULAR | Status: AC
  Filled 2021-03-22: qty 1

## 2021-03-22 MED ORDER — SODIUM CHLORIDE 0.9 % IV SOLN
150.0000 mg | Freq: Once | INTRAVENOUS | Status: AC
  Administered 2021-03-22: 150 mg via INTRAVENOUS
  Filled 2021-03-22: qty 150

## 2021-03-22 MED ORDER — FAMOTIDINE 20 MG IN NS 100 ML IVPB
20.0000 mg | Freq: Once | INTRAVENOUS | Status: AC
Start: 2021-03-22 — End: 2021-03-22
  Administered 2021-03-22: 20 mg via INTRAVENOUS

## 2021-03-22 MED ORDER — SODIUM CHLORIDE 0.9 % IV SOLN
465.5000 mg | Freq: Once | INTRAVENOUS | Status: AC
  Administered 2021-03-22: 470 mg via INTRAVENOUS
  Filled 2021-03-22: qty 47

## 2021-03-22 MED ORDER — MIRTAZAPINE 15 MG PO TBDP
15.0000 mg | ORAL_TABLET | Freq: Every day | ORAL | 3 refills | Status: DC
  Filled 2021-03-22: qty 30, 30d supply, fill #0

## 2021-03-22 MED ORDER — PALONOSETRON HCL INJECTION 0.25 MG/5ML
0.2500 mg | Freq: Once | INTRAVENOUS | Status: AC
  Administered 2021-03-22: 0.25 mg via INTRAVENOUS

## 2021-03-22 MED ORDER — SODIUM CHLORIDE 0.9% FLUSH
10.0000 mL | Freq: Once | INTRAVENOUS | Status: AC
Start: 2021-03-22 — End: 2021-03-22
  Administered 2021-03-22: 10 mL
  Filled 2021-03-22: qty 10

## 2021-03-22 MED ORDER — DEXAMETHASONE SODIUM PHOSPHATE 10 MG/ML IJ SOLN
5.0000 mg | Freq: Once | INTRAMUSCULAR | Status: AC
  Administered 2021-03-22: 5 mg via INTRAVENOUS

## 2021-03-22 MED ORDER — SODIUM CHLORIDE 0.9 % IV SOLN
Freq: Once | INTRAVENOUS | Status: AC
Start: 2021-03-22 — End: 2021-03-22
  Filled 2021-03-22: qty 250

## 2021-03-22 NOTE — Assessment & Plan Note (Signed)
She will continue laxatives therapy

## 2021-03-22 NOTE — Assessment & Plan Note (Signed)
She continues to have profound weight loss with each cycle of treatment I will prescribe dose adjustment to her treatment I will also reduce IV premed dexamethasone before each cycle of therapy She was supposed to call for PET CT scan staging; it was scheduled but then she canceled it due to lack of transportation She will call and reschedule We will assess progress with PET/CT imaging Clinically, she appears to be responding to treatment

## 2021-03-22 NOTE — Progress Notes (Signed)
[facility] OFFICE PROGRESS NOTE  Patient Care Team: [name], FNP as PCP - General (Family Medicine) Awanda Mink Craige Cotta, RN as Oncology Nurse Navigator (Oncology)  ASSESSMENT & PLAN:  Uterine cancer [facility]) She continues to have profound weight loss with each cycle of treatment I will prescribe dose adjustment to her treatment I will also reduce IV premed dexamethasone before each cycle of therapy She was supposed to call for PET CT scan staging; it was scheduled but then she canceled it due to lack of transportation She will call and reschedule We will assess progress with PET/CT imaging Clinically, she appears to be responding to treatment  Cancer associated pain Her pain is multifactorial I refill her prescription hydromorphone today  Abnormal weight loss She continues to have progressive weight loss due to malignant cachexia and swallowing difficulties Recommend frequent small meals and high-protein intake I will also prescribe her with Remeron to see if that would help with the sleep and appetite  Hypomagnesemia I will prescribe magnesium replacement therapy  Insomnia She is bothered by insomnia and unable to swallow trazodone I will prescribe sublingual mirtazapine  Other constipation She will continue laxatives therapy  Metastasis to liver [facility]) Her liver enzymes are okay Observe closely Will order PET CT scan as above  Esophagitis She continues to have symptoms of esophagitis She will continue antiacid treatment   No orders of the defined types were placed in this encounter.   All questions were answered. The patient knows to call the clinic with any problems, questions or concerns. The total time spent in the appointment was 40 minutes encounter with patients including review of chart and various tests results, discussions about plan of care and coordination of care plan   [name], MD 03/22/2021 12:06 PM  INTERVAL HISTORY: Please  see below for problem oriented charting. She returns for chemotherapy and follow-up She continues to have intermittent abdominal pain but better with Dilaudid She is troubled by lack of sleep She used to take trazodone but unable to swallow Her esophagitis is slightly improved She denies recent constipation She takes pain medicine daily for abdominal pain She felt weak overall.  She is unable to increase her oral intake She appears pleased by her recent weight loss and does not want to gain back any weight Denies worsening peripheral neuropathy  SUMMARY OF ONCOLOGIC HISTORY: Oncology History Overview Note  Adenocarcinoma with component of squamous differentiation and serous component  HER2 negative IHC MMR intact   Uterine cancer ([place])  11/23/2020 Pathology Results   FINAL MICROSCOPIC DIAGNOSIS:  - Malignant cells consistent with non-small cell carcinoma  - See comment   SPECIMEN ADEQUACY:  Satisfactory for evaluation   DIAGNOSTIC COMMENTS:  Morphologically, the malignant cells are suggestive of an adenocarcinoma.  However, immunohistochemical stains show that the tumor cells are positive for CK7 with patchy labeling for p63 and CK 5/6 whereas they are negative for TTF-1, Napsin-A, CK20, calretinin, D2-40, GATA3, ER and CDX2.  Ki-67 stain shows increased proliferative index. Positive staining for CK5/6 and p63 is suggestive of elements of squamous differentiation.   11/23/2020 Imaging   Ct chest 1. Near complete evacuation of the right pleural effusion after right-sided pigtail drainage catheter placement. Trace residual effusion is partially loculated. 2. Dense consolidation throughout the right lung, greatest at the right lung base. Findings are consistent with a combination of pneumonia and atelectasis. Follow-up imaging after completion of appropriate medical management is recommended to exclude underlying neoplasm. 3. Indeterminate hypodensity  right lobe liver. If further  evaluation is desired, dedicated liver CT or MRI may be useful.   12/08/2020 PET scan   1. Unusual pattern of hypermetabolic metastatic disease. 2. Intense metabolic activity associated with pleural thickening in the RIGHT hemithorax consistent with pleural metastasis versus primary pleural malignancy. 3. Isolated LEFT internal mammary nodal metastasis. 4. Intensely hypermetabolic lesion within the liver beneath the hypermetabolic lesions within the diaphragm. Differential includes local extension of pleural metastasis, hematogenous liver metastasis versus hepatic abscess. Favor hematogenous liver metastasis.  5. Hypermetabolic retroperitoneal metastatic adenopathy and internal iliac adenopathy. 6. Hypermetabolic thickening within the LEFT rectus muscle consistent with muscular skeletal metastasis. 7. Intense metabolic activity within enlarged uterus. Recommend gyn evaluation and potential MRI of the uterus to evaluate for primary uterine carcinoma. Unusual site of metastasis. Benign leiomyoma can be hypermetabolic but typically not this intense. 8. Rim of hypermetabolic activity in the vagina towards the introitus. Recommend evaluation as above.   12/20/2020 Initial Diagnosis   Uterine cancer ([place])   12/26/2020 Pathology Results   A. ENDOCERVIX, CURETTAGE:  - Scant benign squamous epithelium.   B. ENDOMETRIUM, BIOPSY:  - Adenocarcinoma, see comment.   COMMENT:   B. There are malignant glands some which have marked cytologic atypia. There if focal squamous differentiation. The tumor is positive for cytokeratin 7, PAX-8, GATA-3, ER (10% strong), p53, and WT-1. The cells are negative for cytokeratin 20, CDX-2, TTF-1, and NapsinA. The overall findings are consistent with a gynecologic adenocarcinoma. While squamous differentiation is most commonly seen in endometrioid/endocervical adenocarcinoma, the strong p53 staining and focal marked atypia is suggestive of a serous component.    01/03/2021  Cancer Staging   Staging form: Corpus Uteri - Carcinoma and Carcinosarcoma, AJCC 8th Edition - Clinical: FIGO Stage IVB (cT3b, cN2a, pM1) - Signed by [name], MD on 01/03/2021   01/16/2021 Procedure   Successful placement of a right internal jugular approach power injectable Port-A-Cath. The catheter is ready for immediate use.   01/17/2021 -  Chemotherapy    Patient is on Treatment Plan: UTERINE CARBOPLATIN AUC 6 / PACLITAXEL Q21D      02/25/2021 Imaging   1. No evidence of pulmonary embolus. 2. Persistent right pleural thickening and loculated right pleural effusion, without significant change since previous PET scan. 3. Decreased prominence of the hypodensities along the superior aspect right lobe liver, likely representing response to therapy. 4. Indeterminate sclerotic focus along the inferior endplate of the Q75 vertebral body, new since prior study. This would be an unusual location for bony metastases, and may reflect developing Schmorl's node.   Metastasis to liver ([place])  01/03/2021 Initial Diagnosis   Metastasis to liver ([place])   01/17/2021 -  Chemotherapy    Patient is on Treatment Plan: UTERINE CARBOPLATIN AUC 6 / PACLITAXEL Q21D        REVIEW OF SYSTEMS:   Constitutional: Denies fevers, chills  Eyes: Denies blurriness of vision Ears, nose, mouth, throat, and face: Denies mucositis or sore throat Respiratory: Denies cough, dyspnea or wheezes Cardiovascular: Denies palpitation, chest discomfort or lower extremity swelling Skin: Denies abnormal skin rashes Lymphatics: Denies new lymphadenopathy or easy bruising Behavioral/Psych: Mood is stable, no new changes  All other systems were reviewed with the patient and are negative.  I have reviewed the past medical history, past surgical history, social history and family history with the patient and they are unchanged from previous note.  ALLERGIES:  is allergic to eggs or egg-derived products, influenza vaccines,  peanut-containing drug products,  penicillins, sulfa antibiotics, and codeine.  MEDICATIONS:  Current Outpatient Medications  Medication Sig Dispense Refill  . mirtazapine (REMERON SOL-TAB) 15 MG disintegrating tablet Dissolve  1 tablet (15 mg total) in mouth at bedtime. 30 tablet 3  . Alum & Mag Hydroxide-Simeth (MYLANTA PO) Take 1 Dose by mouth as needed (stomach upset, heartburn, indigestion.).    Marland Kitchen calcium carbonate (TUMS - DOSED IN MG ELEMENTAL CALCIUM) 500 MG chewable tablet Chew 1 tablet by mouth in the morning and at bedtime.    . carvedilol (COREG) 12.5 MG tablet Take 12.5 mg by mouth 2 (two) times daily with a meal.    . Cholecalciferol (VITAMIN D) 125 MCG (5000 UT) CAPS Take 5,000 Units by mouth daily.    Marland Kitchen dexamethasone (DECADRON) 4 MG tablet TAKE 2 TABLETS BY MOUTH NIGHT BEFORE AND 2 TABLETS IN THE MORNING OF CHEMO EVERY 3 WEEKS FOR 6 CYCLES 36 tablet 6  . famotidine (PEPCID) 40 MG/5ML suspension Take 2.5 mLs (20 mg total) by mouth 2 (two) times daily as needed (Heartburn). 150 mL 3  . FLUoxetine (PROZAC) 20 MG/5ML solution Take 10 mLs (40 mg total) by mouth daily. 300 mL 3  . HYDROmorphone (DILAUDID) 4 MG tablet TAKE 1 TABLET BY MOUTH EVERY 4 HOURS AS NEEDED FOR SEVERE PAIN. 60 tablet 0  . lidocaine (LIDODERM) 5 % Place 1 patch onto the skin daily. Remove & Discard patch within 12 hours or as directed by MD 30 patch 0  . lidocaine-prilocaine (EMLA) cream APPLY TO AFFECTED AREA ONCE AS DIRECTED (Patient taking differently: Apply 1 application topically once.) 30 g 3  . MELATONIN PO Take 10 mg by mouth at bedtime.    . ondansetron (ZOFRAN) 8 MG tablet TAKE 1 TABLET BY MOUTH EVERY 8 HOURS AS NEEDED START ON THE THIRD DAY AFTER CHEMO (Patient taking differently: Take 8 mg by mouth every 8 (eight) hours as needed for nausea or vomiting.) 30 tablet 1  . polyethylene glycol (MIRALAX / GLYCOLAX) 17 g packet Take 17 g by mouth daily.    . prochlorperazine (COMPAZINE) 10 MG tablet TAKE 1 TABLET  BY MOUTH EVERY 6 HOURS AS NEEDED FOR NAUSEA OR VOMITING (Patient taking differently: Take 10 mg by mouth every 6 (six) hours as needed for nausea or vomiting.) 30 tablet 1   No current facility-administered medications for this visit.   Facility-Administered Medications Ordered in Other Visits  Medication Dose Route Frequency Provider Last Rate Last Admin  . CARBOplatin (PARAPLATIN) 470 mg in sodium chloride 0.9 % 250 mL chemo infusion  470 mg Intravenous Once Alvy Bimler, Bailee Thall, MD      . dexamethasone (DECADRON) injection 5 mg  5 mg Intravenous Once Alvy Bimler, Jefrey Raburn, MD      . diphenhydrAMINE (BENADRYL) injection 25 mg  25 mg Intravenous Once Alvy Bimler, Rosabelle Jupin, MD      . famotidine (PEPCID) IVPB 20 mg in NS 100 mL IVPB  20 mg Intravenous Once Alvy Bimler, Savilla Turbyfill, MD      . fosaprepitant (EMEND) 150 mg in sodium chloride 0.9 % 145 mL IVPB  150 mg Intravenous Once Alvy Bimler, Blanca Thornton, MD      . heparin lock flush 100 unit/mL  500 Units Intracatheter Once PRN Alvy Bimler, Royden Bulman, MD      . PACLitaxel (TAXOL) 264 mg in sodium chloride 0.9 % 250 mL chemo infusion (> 23m/m2)  140 mg/m2 (Treatment Plan Recorded) Intravenous Once Karman Biswell, MD      . palonosetron (ALOXI) injection 0.25 mg  0.25 mg Intravenous  Once [name], MD      . sodium chloride flush (NS) 0.9 % injection 10 mL  10 mL Intracatheter PRN Alvy Bimler, Freddy Kinne, MD        PHYSICAL EXAMINATION: ECOG PERFORMANCE STATUS: 1 - Symptomatic but completely ambulatory  Vitals:   03/22/21 1120  BP: 114/73  Pulse: 74  Resp: 18  Temp: 97.7 F (36.5 C)  SpO2: 100%   Filed Weights   03/22/21 1120  Weight: 169 lb 9.6 oz (76.9 kg)    GENERAL:alert, no distress and comfortable SKIN: skin color, texture, turgor are normal, no rashes or significant lesions EYES: normal, Conjunctiva are pink and non-injected, sclera clear OROPHARYNX:no exudate, no erythema and lips, buccal mucosa, and tongue normal  NECK: supple, thyroid normal size, non-tender, without nodularity LYMPH:  no  palpable lymphadenopathy in the cervical, axillary or inguinal LUNGS: clear to auscultation and percussion with normal breathing effort HEART: regular rate & rhythm and no murmurs and no lower extremity edema ABDOMEN:abdomen soft, non-tender and normal bowel sounds Musculoskeletal:no cyanosis of digits and no clubbing  NEURO: alert & oriented x 3 with fluent speech, no focal motor/sensory deficits  LABORATORY DATA:  I have reviewed the data as listed    Component Value Date/Time   NA 139 03/22/2021 1059   K 2.9 (L) 03/22/2021 1059   CL 91 (L) 03/22/2021 1059   CO2 32 03/22/2021 1059   GLUCOSE 170 (H) 03/22/2021 1059   BUN 10 03/22/2021 1059   CREATININE 0.71 03/22/2021 1059   CALCIUM 9.5 03/22/2021 1059   PROT 7.2 03/22/2021 1059   ALBUMIN 3.0 (L) 03/22/2021 1059   AST 33 03/22/2021 1059   ALT 34 03/22/2021 1059   ALKPHOS 72 03/22/2021 1059   BILITOT 0.5 03/22/2021 1059   GFRNONAA >60 03/22/2021 1059    No results found for: SPEP, UPEP  Lab Results  Component Value Date   WBC 5.9 03/22/2021   NEUTROABS 4.4 03/22/2021   HGB 11.1 (L) 03/22/2021   HCT 32.5 (L) 03/22/2021   MCV 82.3 03/22/2021   PLT 153 03/22/2021      Chemistry      Component Value Date/Time   NA 139 03/22/2021 1059   K 2.9 (L) 03/22/2021 1059   CL 91 (L) 03/22/2021 1059   CO2 32 03/22/2021 1059   BUN 10 03/22/2021 1059   CREATININE 0.71 03/22/2021 1059      Component Value Date/Time   CALCIUM 9.5 03/22/2021 1059   ALKPHOS 72 03/22/2021 1059   AST 33 03/22/2021 1059   ALT 34 03/22/2021 1059   BILITOT 0.5 03/22/2021 1059       RADIOGRAPHIC STUDIES: I have personally reviewed the radiological images as listed and agreed with the findings in the report. DG Chest 2 View  Result Date: 02/25/2021 CLINICAL DATA:  Shortness of breath.  History of pleural effusion. EXAM: CHEST - 2 VIEW COMPARISON:  Chest x-ray dated 02/08/2021 and chest CT dated 11/23/2020. FINDINGS: Borderline cardiomegaly, stable.  RIGHT chest wall Port-A-Cath is stable in position with tip at the expected level of the cavoatrial junction. LEFT lung is clear. Persistent opacity within the RIGHT lower lobe, corresponding to the mass/consolidation demonstrated on earlier chest CT of 11/23/2020 and subsequent PET CT of 12/08/2020. Small RIGHT pleural effusion IMPRESSION: 1. Persistent opacity within the RIGHT lower lobe, corresponding to the mass/consolidation demonstrated on earlier chest CT of 11/23/2020 and subsequent PET-CT which described this finding as presumed pleural malignancy/metastasis. 2. Small RIGHT pleural effusion. 3. No new  lung findings.  No acute findings. Electronically Signed   By: Franki Cabot M.D.   On: 02/25/2021 14:08   CT Angio Chest PE W and/or Wo Contrast  Result Date: 02/25/2021 CLINICAL DATA:  History of metastatic uterine cancer with malignant right pleural effusion, short of breath EXAM: CT ANGIOGRAPHY CHEST WITH CONTRAST TECHNIQUE: Multidetector CT imaging of the chest was performed using the standard protocol during bolus administration of intravenous contrast. Multiplanar CT image reconstructions and MIPs were obtained to evaluate the vascular anatomy. CONTRAST:  13m OMNIPAQUE IOHEXOL 350 MG/ML SOLN COMPARISON:  11/23/2020, 12/08/2020 FINDINGS: Cardiovascular: This is a technically adequate evaluation of the pulmonary vasculature. No filling defects or pulmonary emboli. The heart is unremarkable without pericardial effusion. No evidence of thoracic aortic aneurysm or dissection. Right chest wall port via internal jugular approach, tip within the superior vena cava. Mediastinum/Nodes: No enlarged mediastinal, hilar, or axillary lymph nodes. Thyroid gland, trachea, and esophagus demonstrate no significant findings. Lungs/Pleura: Diffuse right pleural thickening consistent with known pleural metastases. Loculated right pleural effusion is noted, not appreciably changed since prior PET scan. Minimal  atelectasis within the right lower lobe. No acute airspace disease. No pneumothorax. The central airways are patent. Upper Abdomen: The hypodensities within the right lobe liver seen on previous studies are not as well visualized on this exam, may reflect decreased size due to response to therapy. The subcapsular hypodensity measures 2 cm on today's exam, previously having measured up to 4 cm. Trace Peri hepatic fluid under the right hemidiaphragm. Musculoskeletal: There is new sclerosis along the inferior endplate of the TV94vertebral body, without overlying bony destruction. This is nonspecific. No other acute or destructive bony lesions. Reconstructed images demonstrate no additional findings. Review of the MIP images confirms the above findings. IMPRESSION: 1. No evidence of pulmonary embolus. 2. Persistent right pleural thickening and loculated right pleural effusion, without significant change since previous PET scan. 3. Decreased prominence of the hypodensities along the superior aspect right lobe liver, likely representing response to therapy. 4. Indeterminate sclerotic focus along the inferior endplate of the TI01vertebral body, new since prior study. This would be an unusual location for bony metastases, and may reflect developing Schmorl's node. Electronically Signed   By: MRanda NgoM.D.   On: 02/25/2021 17:32

## 2021-03-22 NOTE — Assessment & Plan Note (Signed)
She is bothered by insomnia and unable to swallow trazodone I will prescribe sublingual mirtazapine

## 2021-03-22 NOTE — Assessment & Plan Note (Signed)
She continues to have progressive weight loss due to malignant cachexia and swallowing difficulties Recommend frequent small meals and high-protein intake I will also prescribe her with Remeron to see if that would help with the sleep and appetite

## 2021-03-22 NOTE — Assessment & Plan Note (Signed)
She continues to have symptoms of esophagitis She will continue antiacid treatment

## 2021-03-22 NOTE — Patient Instructions (Signed)
[facility]  Discharge Instructions: Thank you for choosing [facility] to provide your oncology and hematology care.   If you have a lab appointment with the [facility], please go directly to the [facility] and check in at the registration area.   Wear comfortable clothing and clothing appropriate for easy access to any Portacath or PICC line.   We strive to give you quality time with your provider. You may need to reschedule your appointment if you arrive late (15 or more minutes).  Arriving late affects you and other patients whose appointments are after yours.  Also, if you miss three or more appointments without notifying the office, you may be dismissed from the clinic at the provider's discretion.      For prescription refill requests, have your pharmacy contact our office and allow 72 hours for refills to be completed.    Today you received the following chemotherapy and/or immunotherapy agents Taxol & Carboplatin     To help prevent nausea and vomiting after your treatment, we encourage you to take your nausea medication as directed.  BELOW ARE SYMPTOMS THAT SHOULD BE REPORTED IMMEDIATELY: . *FEVER GREATER THAN 100.4 F (38 C) OR HIGHER . *CHILLS OR SWEATING . *NAUSEA AND VOMITING THAT IS NOT CONTROLLED WITH YOUR NAUSEA MEDICATION . *UNUSUAL SHORTNESS OF BREATH . *UNUSUAL BRUISING OR BLEEDING . *URINARY PROBLEMS (pain or burning when urinating, or frequent urination) . *BOWEL PROBLEMS (unusual diarrhea, constipation, pain near the anus) . TENDERNESS IN MOUTH AND THROAT WITH OR WITHOUT PRESENCE OF ULCERS (sore throat, sores in mouth, or a toothache) . UNUSUAL RASH, SWELLING OR PAIN  . UNUSUAL VAGINAL DISCHARGE OR ITCHING   Items with * indicate a potential emergency and should be followed up as soon as possible or go to the Emergency Department if any problems should occur.  Please show the CHEMOTHERAPY ALERT CARD or  IMMUNOTHERAPY ALERT CARD at check-in to the Emergency Department and triage nurse.  Should you have questions after your visit or need to cancel or reschedule your appointment, please contact [facility]  Dept: [phone]  and follow the prompts.  Office hours are 8:00 a.m. to 4:30 p.m. Monday - Friday. Please note that voicemails left after 4:00 p.m. may not be returned until the following business day.  We are closed weekends and major holidays. You have access to a nurse at all times for urgent questions. Please call the main number to the clinic Dept: [phone] and follow the prompts.   For any non-urgent questions, you may also contact your provider using MyChart. We now offer e-Visits for anyone 50 and older to request care online for non-urgent symptoms. For details visit [email or web address].   Also download the MyChart app! Go to the app store, search "MyChart", open the app, select [facility], and log in with your MyChart username and password.  Due to Covid, a mask is required upon entering the hospital/clinic. If you do not have a mask, one will be given to you upon arrival. For doctor visits, patients may have 1 support person aged 63 or older with them. For treatment visits, patients cannot have anyone with them due to current Covid guidelines and our immunocompromised population.   Paclitaxel injection What is this medicine? PACLITAXEL (PAK li TAX el) is a chemotherapy drug. It targets fast dividing cells, like cancer cells, and causes these cells to die. This medicine is used to treat ovarian cancer, breast  cancer, lung cancer, Kaposi's sarcoma, and other cancers. This medicine may be used for other purposes; ask your health care provider or pharmacist if you have questions. COMMON BRAND NAME(S): Onxol, Taxol What should I tell my health care provider before I take this medicine? They need to know if you have any of these conditions:  history  of irregular heartbeat  liver disease  low blood counts, like low white cell, platelet, or red cell counts  lung or breathing disease, like asthma  tingling of the fingers or toes, or other nerve disorder  an unusual or allergic reaction to paclitaxel, alcohol, polyoxyethylated castor oil, other chemotherapy, other medicines, foods, dyes, or preservatives  pregnant or trying to get pregnant  breast-feeding How should I use this medicine? This drug is given as an infusion into a vein. It is administered in a hospital or clinic by a specially trained health care professional. Talk to your pediatrician regarding the use of this medicine in children. Special care may be needed. Overdosage: If you think you have taken too much of this medicine contact a poison control center or emergency room at once. NOTE: This medicine is only for you. Do not share this medicine with others. What if I miss a dose? It is important not to miss your dose. Call your doctor or health care professional if you are unable to keep an appointment. What may interact with this medicine? Do not take this medicine with any of the following medications:  live virus vaccines This medicine may also interact with the following medications:  antiviral medicines for hepatitis, HIV or AIDS  certain antibiotics like erythromycin and clarithromycin  certain medicines for fungal infections like ketoconazole and itraconazole  certain medicines for seizures like carbamazepine, phenobarbital, phenytoin  gemfibrozil  nefazodone  rifampin  St. John's wort This list may not describe all possible interactions. Give your health care provider a list of all the medicines, herbs, non-prescription drugs, or dietary supplements you use. Also tell them if you smoke, drink alcohol, or use illegal drugs. Some items may interact with your medicine. What should I watch for while using this medicine? Your condition will be monitored  carefully while you are receiving this medicine. You will need important blood work done while you are taking this medicine. This medicine can cause serious allergic reactions. To reduce your risk you will need to take other medicine(s) before treatment with this medicine. If you experience allergic reactions like skin rash, itching or hives, swelling of the face, lips, or tongue, tell your doctor or health care professional right away. In some cases, you may be given additional medicines to help with side effects. Follow all directions for their use. This drug may make you feel generally unwell. This is not uncommon, as chemotherapy can affect healthy cells as well as cancer cells. Report any side effects. Continue your course of treatment even though you feel ill unless your doctor tells you to stop. Call your doctor or health care professional for advice if you get a fever, chills or sore throat, or other symptoms of a cold or flu. Do not treat yourself. This drug decreases your body's ability to fight infections. Try to avoid being around people who are sick. This medicine may increase your risk to bruise or bleed. Call your doctor or health care professional if you notice any unusual bleeding. Be careful brushing and flossing your teeth or using a toothpick because you may get an infection or bleed more easily. If  you have any dental work done, tell your dentist you are receiving this medicine. Avoid taking products that contain aspirin, acetaminophen, ibuprofen, naproxen, or ketoprofen unless instructed by your doctor. These medicines may hide a fever. Do not become pregnant while taking this medicine. Women should inform their doctor if they wish to become pregnant or think they might be pregnant. There is a potential for serious side effects to an unborn child. Talk to your health care professional or pharmacist for more information. Do not breast-feed an infant while taking this medicine. Men are  advised not to father a child while receiving this medicine. This product may contain alcohol. Ask your pharmacist or healthcare provider if this medicine contains alcohol. Be sure to tell all healthcare providers you are taking this medicine. Certain medicines, like metronidazole and disulfiram, can cause an unpleasant reaction when taken with alcohol. The reaction includes flushing, headache, nausea, vomiting, sweating, and increased thirst. The reaction can last from 30 minutes to several hours. What side effects may I notice from receiving this medicine? Side effects that you should report to your doctor or health care professional as soon as possible:  allergic reactions like skin rash, itching or hives, swelling of the face, lips, or tongue  breathing problems  changes in vision  fast, irregular heartbeat  high or low blood pressure  mouth sores  pain, tingling, numbness in the hands or feet  signs of decreased platelets or bleeding - bruising, pinpoint red spots on the skin, black, tarry stools, blood in the urine  signs of decreased red blood cells - unusually weak or tired, feeling faint or lightheaded, falls  signs of infection - fever or chills, cough, sore throat, pain or difficulty passing urine  signs and symptoms of liver injury like dark yellow or brown urine; general ill feeling or flu-like symptoms; light-colored stools; loss of appetite; nausea; right upper belly pain; unusually weak or tired; yellowing of the eyes or skin  swelling of the ankles, feet, hands  unusually slow heartbeat Side effects that usually do not require medical attention (report to your doctor or health care professional if they continue or are bothersome):  diarrhea  hair loss  loss of appetite  muscle or joint pain  nausea, vomiting  pain, redness, or irritation at site where injected  tiredness This list may not describe all possible side effects. Call your doctor for medical  advice about side effects. You may report side effects to FDA at 1-800-FDA-1088. Where should I keep my medicine? This drug is given in a hospital or clinic and will not be stored at home. NOTE: This sheet is a summary. It may not cover all possible information. If you have questions about this medicine, talk to your doctor, pharmacist, or health care provider.  2021 Elsevier/Gold Standard (2019-09-15 13:37:23)  Carboplatin injection What is this medicine? CARBOPLATIN (KAR boe pla tin) is a chemotherapy drug. It targets fast dividing cells, like cancer cells, and causes these cells to die. This medicine is used to treat ovarian cancer and many other cancers. This medicine may be used for other purposes; ask your health care provider or pharmacist if you have questions. COMMON BRAND NAME(S): Paraplatin What should I tell my health care provider before I take this medicine? They need to know if you have any of these conditions:  blood disorders  hearing problems  kidney disease  recent or ongoing radiation therapy  an unusual or allergic reaction to carboplatin, cisplatin, other chemotherapy, other medicines,  foods, dyes, or preservatives  pregnant or trying to get pregnant  breast-feeding How should I use this medicine? This drug is usually given as an infusion into a vein. It is administered in a hospital or clinic by a specially trained health care professional. Talk to your pediatrician regarding the use of this medicine in children. Special care may be needed. Overdosage: If you think you have taken too much of this medicine contact a poison control center or emergency room at once. NOTE: This medicine is only for you. Do not share this medicine with others. What if I miss a dose? It is important not to miss a dose. Call your doctor or health care professional if you are unable to keep an appointment. What may interact with this medicine?  medicines for seizures  medicines  to increase blood counts like filgrastim, pegfilgrastim, sargramostim  some antibiotics like amikacin, gentamicin, neomycin, streptomycin, tobramycin  vaccines Talk to your doctor or health care professional before taking any of these medicines:  acetaminophen  aspirin  ibuprofen  ketoprofen  naproxen This list may not describe all possible interactions. Give your health care provider a list of all the medicines, herbs, non-prescription drugs, or dietary supplements you use. Also tell them if you smoke, drink alcohol, or use illegal drugs. Some items may interact with your medicine. What should I watch for while using this medicine? Your condition will be monitored carefully while you are receiving this medicine. You will need important blood work done while you are taking this medicine. This drug may make you feel generally unwell. This is not uncommon, as chemotherapy can affect healthy cells as well as cancer cells. Report any side effects. Continue your course of treatment even though you feel ill unless your doctor tells you to stop. In some cases, you may be given additional medicines to help with side effects. Follow all directions for their use. Call your doctor or health care professional for advice if you get a fever, chills or sore throat, or other symptoms of a cold or flu. Do not treat yourself. This drug decreases your body's ability to fight infections. Try to avoid being around people who are sick. This medicine may increase your risk to bruise or bleed. Call your doctor or health care professional if you notice any unusual bleeding. Be careful brushing and flossing your teeth or using a toothpick because you may get an infection or bleed more easily. If you have any dental work done, tell your dentist you are receiving this medicine. Avoid taking products that contain aspirin, acetaminophen, ibuprofen, naproxen, or ketoprofen unless instructed by your doctor. These medicines  may hide a fever. Do not become pregnant while taking this medicine. Women should inform their doctor if they wish to become pregnant or think they might be pregnant. There is a potential for serious side effects to an unborn child. Talk to your health care professional or pharmacist for more information. Do not breast-feed an infant while taking this medicine. What side effects may I notice from receiving this medicine? Side effects that you should report to your doctor or health care professional as soon as possible:  allergic reactions like skin rash, itching or hives, swelling of the face, lips, or tongue  signs of infection - fever or chills, cough, sore throat, pain or difficulty passing urine  signs of decreased platelets or bleeding - bruising, pinpoint red spots on the skin, black, tarry stools, nosebleeds  signs of decreased red blood cells -  unusually weak or tired, fainting spells, lightheadedness  breathing problems  changes in hearing  changes in vision  chest pain  high blood pressure  low blood counts - This drug may decrease the number of white blood cells, red blood cells and platelets. You may be at increased risk for infections and bleeding.  nausea and vomiting  pain, swelling, redness or irritation at the injection site  pain, tingling, numbness in the hands or feet  problems with balance, talking, walking  trouble passing urine or change in the amount of urine Side effects that usually do not require medical attention (report to your doctor or health care professional if they continue or are bothersome):  hair loss  loss of appetite  metallic taste in the mouth or changes in taste This list may not describe all possible side effects. Call your doctor for medical advice about side effects. You may report side effects to FDA at 1-800-FDA-1088. Where should I keep my medicine? This drug is given in a hospital or clinic and will not be stored at  home. NOTE: This sheet is a summary. It may not cover all possible information. If you have questions about this medicine, talk to your doctor, pharmacist, or health care provider.  2021 Elsevier/Gold Standard (2008-01-19 14:38:05)

## 2021-03-22 NOTE — Assessment & Plan Note (Signed)
I will prescribe magnesium replacement therapy

## 2021-03-22 NOTE — Assessment & Plan Note (Signed)
Her liver enzymes are okay Observe closely Will order PET CT scan as above

## 2021-03-22 NOTE — Assessment & Plan Note (Signed)
Her pain is multifactorial I refill her prescription hydromorphone today

## 2021-03-23 ENCOUNTER — Telehealth: Payer: Self-pay

## 2021-03-23 ENCOUNTER — Other Ambulatory Visit (HOSPITAL_COMMUNITY): Payer: Self-pay

## 2021-03-23 DIAGNOSIS — C787 Secondary malignant neoplasm of liver and intrahepatic bile duct: Secondary | ICD-10-CM | POA: Diagnosis not present

## 2021-03-23 DIAGNOSIS — Z79899 Other long term (current) drug therapy: Secondary | ICD-10-CM | POA: Diagnosis not present

## 2021-03-23 DIAGNOSIS — Z5111 Encounter for antineoplastic chemotherapy: Secondary | ICD-10-CM | POA: Diagnosis present

## 2021-03-23 DIAGNOSIS — C55 Malignant neoplasm of uterus, part unspecified: Secondary | ICD-10-CM | POA: Diagnosis present

## 2021-03-23 DIAGNOSIS — G893 Neoplasm related pain (acute) (chronic): Secondary | ICD-10-CM | POA: Diagnosis not present

## 2021-03-23 NOTE — Telephone Encounter (Signed)
She called and left a message to call her.  Called back. She had multiple diarrhea stools last night after she got home from treatment. X 2 stools today. Instructed to start Imodium tablets up to 8 per day if needed, x2 tablets the first stool. Instructed to push oral fluids today and start Gatorade/ Pedialyte. Ask her to call the office back if needed. She verbalized understanding.

## 2021-03-27 ENCOUNTER — Other Ambulatory Visit: Payer: Self-pay

## 2021-03-27 ENCOUNTER — Inpatient Hospital Stay: Payer: Medicare Other

## 2021-03-27 ENCOUNTER — Inpatient Hospital Stay (HOSPITAL_BASED_OUTPATIENT_CLINIC_OR_DEPARTMENT_OTHER): Payer: Medicare Other | Admitting: Hematology and Oncology

## 2021-03-27 ENCOUNTER — Inpatient Hospital Stay (HOSPITAL_COMMUNITY): Payer: Medicare Other

## 2021-03-27 ENCOUNTER — Encounter: Payer: Self-pay | Admitting: Hematology and Oncology

## 2021-03-27 ENCOUNTER — Telehealth: Payer: Self-pay | Admitting: Family Medicine

## 2021-03-27 ENCOUNTER — Inpatient Hospital Stay (HOSPITAL_COMMUNITY)
Admission: AD | Admit: 2021-03-27 | Discharge: 2021-04-28 | DRG: 368 | Disposition: A | Discharge status: Skilled Nursing Facility | Payer: Medicare Other | Source: Ambulatory Visit | Attending: Internal Medicine | Admitting: Internal Medicine

## 2021-03-27 ENCOUNTER — Telehealth: Payer: Self-pay

## 2021-03-27 ENCOUNTER — Other Ambulatory Visit: Payer: Self-pay | Admitting: Hematology and Oncology

## 2021-03-27 VITALS — BP 105/75 | HR 63 | Temp 98.7°F | Resp 16

## 2021-03-27 DIAGNOSIS — C55 Malignant neoplasm of uterus, part unspecified: Secondary | ICD-10-CM

## 2021-03-27 DIAGNOSIS — K209 Esophagitis, unspecified without bleeding: Secondary | ICD-10-CM | POA: Diagnosis present

## 2021-03-27 DIAGNOSIS — J449 Chronic obstructive pulmonary disease, unspecified: Secondary | ICD-10-CM | POA: Diagnosis present

## 2021-03-27 DIAGNOSIS — R627 Adult failure to thrive: Secondary | ICD-10-CM | POA: Diagnosis present

## 2021-03-27 DIAGNOSIS — L89311 Pressure ulcer of right buttock, stage 1: Secondary | ICD-10-CM | POA: Diagnosis not present

## 2021-03-27 DIAGNOSIS — R5081 Fever presenting with conditions classified elsewhere: Secondary | ICD-10-CM | POA: Diagnosis not present

## 2021-03-27 DIAGNOSIS — C787 Secondary malignant neoplasm of liver and intrahepatic bile duct: Secondary | ICD-10-CM | POA: Diagnosis present

## 2021-03-27 DIAGNOSIS — Z7189 Other specified counseling: Secondary | ICD-10-CM

## 2021-03-27 DIAGNOSIS — M5136 Other intervertebral disc degeneration, lumbar region: Secondary | ICD-10-CM | POA: Diagnosis present

## 2021-03-27 DIAGNOSIS — J910 Malignant pleural effusion: Secondary | ICD-10-CM | POA: Diagnosis present

## 2021-03-27 DIAGNOSIS — G6181 Chronic inflammatory demyelinating polyneuritis: Secondary | ICD-10-CM | POA: Diagnosis present

## 2021-03-27 DIAGNOSIS — R0602 Shortness of breath: Secondary | ICD-10-CM

## 2021-03-27 DIAGNOSIS — R1310 Dysphagia, unspecified: Secondary | ICD-10-CM

## 2021-03-27 DIAGNOSIS — E119 Type 2 diabetes mellitus without complications: Secondary | ICD-10-CM | POA: Diagnosis present

## 2021-03-27 DIAGNOSIS — L89152 Pressure ulcer of sacral region, stage 2: Secondary | ICD-10-CM | POA: Diagnosis present

## 2021-03-27 DIAGNOSIS — Z20822 Contact with and (suspected) exposure to covid-19: Secondary | ICD-10-CM | POA: Diagnosis present

## 2021-03-27 DIAGNOSIS — D61818 Other pancytopenia: Secondary | ICD-10-CM | POA: Diagnosis present

## 2021-03-27 DIAGNOSIS — B965 Pseudomonas (aeruginosa) (mallei) (pseudomallei) as the cause of diseases classified elsewhere: Secondary | ICD-10-CM | POA: Diagnosis not present

## 2021-03-27 DIAGNOSIS — G7281 Critical illness myopathy: Secondary | ICD-10-CM | POA: Diagnosis present

## 2021-03-27 DIAGNOSIS — G893 Neoplasm related pain (acute) (chronic): Secondary | ICD-10-CM | POA: Diagnosis not present

## 2021-03-27 DIAGNOSIS — Z885 Allergy status to narcotic agent status: Secondary | ICD-10-CM

## 2021-03-27 DIAGNOSIS — Z515 Encounter for palliative care: Secondary | ICD-10-CM | POA: Diagnosis not present

## 2021-03-27 DIAGNOSIS — Z79899 Other long term (current) drug therapy: Secondary | ICD-10-CM

## 2021-03-27 DIAGNOSIS — R109 Unspecified abdominal pain: Secondary | ICD-10-CM

## 2021-03-27 DIAGNOSIS — R34 Anuria and oliguria: Secondary | ICD-10-CM

## 2021-03-27 DIAGNOSIS — E639 Nutritional deficiency, unspecified: Secondary | ICD-10-CM | POA: Diagnosis present

## 2021-03-27 DIAGNOSIS — C7951 Secondary malignant neoplasm of bone: Secondary | ICD-10-CM | POA: Diagnosis present

## 2021-03-27 DIAGNOSIS — T451X5A Adverse effect of antineoplastic and immunosuppressive drugs, initial encounter: Secondary | ICD-10-CM | POA: Diagnosis present

## 2021-03-27 DIAGNOSIS — S00521A Blister (nonthermal) of lip, initial encounter: Secondary | ICD-10-CM | POA: Diagnosis present

## 2021-03-27 DIAGNOSIS — B3781 Candidal esophagitis: Secondary | ICD-10-CM | POA: Diagnosis present

## 2021-03-27 DIAGNOSIS — M21371 Foot drop, right foot: Secondary | ICD-10-CM | POA: Diagnosis present

## 2021-03-27 DIAGNOSIS — J90 Pleural effusion, not elsewhere classified: Secondary | ICD-10-CM

## 2021-03-27 DIAGNOSIS — Z23 Encounter for immunization: Secondary | ICD-10-CM

## 2021-03-27 DIAGNOSIS — I10 Essential (primary) hypertension: Secondary | ICD-10-CM | POA: Diagnosis present

## 2021-03-27 DIAGNOSIS — R531 Weakness: Secondary | ICD-10-CM | POA: Diagnosis not present

## 2021-03-27 DIAGNOSIS — E43 Unspecified severe protein-calorie malnutrition: Secondary | ICD-10-CM | POA: Diagnosis present

## 2021-03-27 DIAGNOSIS — Z880 Allergy status to penicillin: Secondary | ICD-10-CM

## 2021-03-27 DIAGNOSIS — K319 Disease of stomach and duodenum, unspecified: Secondary | ICD-10-CM | POA: Diagnosis present

## 2021-03-27 DIAGNOSIS — Z887 Allergy status to serum and vaccine status: Secondary | ICD-10-CM

## 2021-03-27 DIAGNOSIS — Z7401 Bed confinement status: Secondary | ICD-10-CM

## 2021-03-27 DIAGNOSIS — Z91010 Allergy to peanuts: Secondary | ICD-10-CM

## 2021-03-27 DIAGNOSIS — G8250 Quadriplegia, unspecified: Secondary | ICD-10-CM | POA: Diagnosis not present

## 2021-03-27 DIAGNOSIS — D709 Neutropenia, unspecified: Secondary | ICD-10-CM | POA: Diagnosis not present

## 2021-03-27 DIAGNOSIS — R634 Abnormal weight loss: Secondary | ICD-10-CM

## 2021-03-27 DIAGNOSIS — R339 Retention of urine, unspecified: Secondary | ICD-10-CM | POA: Diagnosis not present

## 2021-03-27 DIAGNOSIS — Z91012 Allergy to eggs: Secondary | ICD-10-CM

## 2021-03-27 DIAGNOSIS — R070 Pain in throat: Secondary | ICD-10-CM | POA: Diagnosis present

## 2021-03-27 DIAGNOSIS — Z882 Allergy status to sulfonamides status: Secondary | ICD-10-CM

## 2021-03-27 DIAGNOSIS — Z6827 Body mass index (BMI) 27.0-27.9, adult: Secondary | ICD-10-CM

## 2021-03-27 DIAGNOSIS — N390 Urinary tract infection, site not specified: Secondary | ICD-10-CM | POA: Diagnosis not present

## 2021-03-27 DIAGNOSIS — F32A Depression, unspecified: Secondary | ICD-10-CM | POA: Diagnosis present

## 2021-03-27 DIAGNOSIS — E876 Hypokalemia: Secondary | ICD-10-CM | POA: Diagnosis present

## 2021-03-27 DIAGNOSIS — L8990 Pressure ulcer of unspecified site, unspecified stage: Secondary | ICD-10-CM | POA: Insufficient documentation

## 2021-03-27 DIAGNOSIS — K2970 Gastritis, unspecified, without bleeding: Secondary | ICD-10-CM | POA: Diagnosis present

## 2021-03-27 DIAGNOSIS — Z5111 Encounter for antineoplastic chemotherapy: Secondary | ICD-10-CM | POA: Diagnosis not present

## 2021-03-27 DIAGNOSIS — F419 Anxiety disorder, unspecified: Secondary | ICD-10-CM | POA: Diagnosis present

## 2021-03-27 DIAGNOSIS — K2100 Gastro-esophageal reflux disease with esophagitis, without bleeding: Secondary | ICD-10-CM | POA: Diagnosis present

## 2021-03-27 DIAGNOSIS — R1010 Upper abdominal pain, unspecified: Secondary | ICD-10-CM | POA: Diagnosis not present

## 2021-03-27 DIAGNOSIS — M21372 Foot drop, left foot: Secondary | ICD-10-CM | POA: Diagnosis present

## 2021-03-27 LAB — COMPREHENSIVE METABOLIC PANEL
ALT: 57 U/L — ABNORMAL HIGH (ref 0–44)
AST: 57 U/L — ABNORMAL HIGH (ref 15–41)
Albumin: 3 g/dL — ABNORMAL LOW (ref 3.5–5.0)
Alkaline Phosphatase: 42 U/L (ref 38–126)
Anion gap: 9 (ref 5–15)
BUN: 11 mg/dL (ref 8–23)
CO2: 34 mmol/L — ABNORMAL HIGH (ref 22–32)
Calcium: 8.3 mg/dL — ABNORMAL LOW (ref 8.9–10.3)
Chloride: 92 mmol/L — ABNORMAL LOW (ref 98–111)
Creatinine, Ser: 0.43 mg/dL — ABNORMAL LOW (ref 0.44–1.00)
GFR, Estimated: >60 mL/min (ref >60)
Glucose, Bld: 114 mg/dL — ABNORMAL HIGH (ref 70–99)
Potassium: 2.2 mmol/L — CL (ref 3.5–5.1)
Sodium: 135 mmol/L (ref 135–145)
Total Bilirubin: 1.1 mg/dL (ref 0.3–1.2)
Total Protein: 6.1 g/dL — ABNORMAL LOW (ref 6.5–8.1)

## 2021-03-27 LAB — CBC
HCT: 29 % — ABNORMAL LOW (ref 36.0–46.0)
Hemoglobin: 9.4 g/dL — ABNORMAL LOW (ref 12.0–15.0)
MCH: 27.9 pg (ref 26.0–34.0)
MCHC: 32.4 g/dL (ref 30.0–36.0)
MCV: 86.1 fL (ref 80.0–100.0)
Platelets: 108 10*3/uL — ABNORMAL LOW (ref 150–400)
RBC: 3.37 MIL/uL — ABNORMAL LOW (ref 3.87–5.11)
RDW: 16.1 % — ABNORMAL HIGH (ref 11.5–15.5)
WBC: 2.6 10*3/uL — ABNORMAL LOW (ref 4.0–10.5)
nRBC: 0 % (ref 0.0–0.2)

## 2021-03-27 LAB — PREALBUMIN: Prealbumin: 16.1 mg/dL — ABNORMAL LOW (ref 18–38)

## 2021-03-27 MED ORDER — SODIUM CHLORIDE 0.9 % IV SOLN: INTRAVENOUS | Status: AC

## 2021-03-27 MED ORDER — FLUOXETINE HCL 20 MG/5ML PO SOLN
20.0000 mg | Freq: Every day | ORAL | Status: DC
  Administered 2021-03-28 – 2021-04-18 (×22): 20 mg via ORAL
  Filled 2021-03-27 (×22): qty 5

## 2021-03-27 MED ORDER — SODIUM CHLORIDE 0.9 % IV SOLN
INTRAVENOUS | Status: DC
  Filled 2021-03-27: qty 250

## 2021-03-27 MED ORDER — ENOXAPARIN SODIUM 40 MG/0.4ML IJ SOSY
40.0000 mg | PREFILLED_SYRINGE | INTRAMUSCULAR | Status: DC
  Administered 2021-03-27 – 2021-04-14 (×18): 40 mg via SUBCUTANEOUS
  Filled 2021-03-27 (×19): qty 0.4

## 2021-03-27 MED ORDER — FLUCONAZOLE IN SODIUM CHLORIDE 200-0.9 MG/100ML-% IV SOLN
200.0000 mg | Freq: Every day | INTRAVENOUS | Status: DC
  Administered 2021-03-27 – 2021-03-29 (×3): 200 mg via INTRAVENOUS
  Filled 2021-03-27 (×3): qty 100

## 2021-03-27 MED ORDER — PANTOPRAZOLE SODIUM 40 MG IV SOLR
40.0000 mg | Freq: Every day | INTRAVENOUS | Status: DC
  Administered 2021-03-27 – 2021-03-28 (×2): 40 mg via INTRAVENOUS
  Filled 2021-03-27 (×2): qty 40

## 2021-03-27 MED ORDER — POLYETHYLENE GLYCOL 3350 17 G PO PACK
17.0000 g | PACK | Freq: Every day | ORAL | Status: DC
  Administered 2021-03-28 – 2021-04-28 (×21): 17 g via ORAL
  Filled 2021-03-27 (×26): qty 1

## 2021-03-27 MED ORDER — MELATONIN 5 MG PO TABS
10.0000 mg | ORAL_TABLET | Freq: Every day | ORAL | Status: DC
  Administered 2021-03-27 – 2021-04-27 (×32): 10 mg via ORAL
  Filled 2021-03-27 (×33): qty 2

## 2021-03-27 MED ORDER — ONDANSETRON HCL 4 MG/2ML IJ SOLN
4.0000 mg | Freq: Four times a day (QID) | INTRAMUSCULAR | Status: DC | PRN
  Administered 2021-03-27 – 2021-04-05 (×5): 4 mg via INTRAVENOUS
  Filled 2021-03-27 (×5): qty 2

## 2021-03-27 MED ORDER — HYDROMORPHONE HCL 1 MG/ML IJ SOLN
1.0000 mg | INTRAMUSCULAR | Status: DC | PRN
  Administered 2021-03-27 – 2021-04-04 (×19): 1 mg via INTRAVENOUS
  Filled 2021-03-27 (×19): qty 1

## 2021-03-27 MED ORDER — SODIUM CHLORIDE 0.9 % IV SOLN
Freq: Once | INTRAVENOUS | Status: AC
  Filled 2021-03-27: qty 250

## 2021-03-27 MED ORDER — HEPARIN SOD (PORK) LOCK FLUSH 100 UNIT/ML IV SOLN
500.0000 [IU] | Freq: Once | INTRAVENOUS | Status: DC
  Filled 2021-03-27: qty 5

## 2021-03-27 MED ORDER — CARVEDILOL 12.5 MG PO TABS
12.5000 mg | ORAL_TABLET | Freq: Two times a day (BID) | ORAL | Status: DC
  Administered 2021-03-28 – 2021-04-28 (×61): 12.5 mg via ORAL
  Filled 2021-03-27 (×61): qty 1

## 2021-03-27 MED ORDER — ONDANSETRON HCL 4 MG PO TABS
4.0000 mg | ORAL_TABLET | Freq: Four times a day (QID) | ORAL | Status: DC | PRN
  Administered 2021-04-05: 4 mg via ORAL
  Filled 2021-03-27: qty 1

## 2021-03-27 MED ORDER — HYDROMORPHONE HCL 1 MG/ML IJ SOLN
2.0000 mg | Freq: Once | INTRAMUSCULAR | Status: AC
  Administered 2021-03-27: 2 mg via INTRAVENOUS

## 2021-03-27 MED ORDER — MIRTAZAPINE 15 MG PO TBDP
15.0000 mg | ORAL_TABLET | Freq: Every day | ORAL | Status: DC
  Administered 2021-03-28 – 2021-04-27 (×29): 15 mg via ORAL
  Filled 2021-03-27 (×31): qty 1

## 2021-03-27 MED ORDER — PHENOL 1.4 % MT LIQD
1.0000 | OROMUCOSAL | Status: DC | PRN
  Administered 2021-03-30: 1 via OROMUCOSAL
  Filled 2021-03-27: qty 177

## 2021-03-27 MED ORDER — SODIUM CHLORIDE 0.9% FLUSH
10.0000 mL | Freq: Once | INTRAVENOUS | Status: DC
  Filled 2021-03-27: qty 10

## 2021-03-27 MED ORDER — FLUOXETINE HCL 20 MG/5ML PO SOLN: 20.0000 mg | Freq: Every day | ORAL | Status: DC

## 2021-03-27 NOTE — Progress Notes (Signed)
RN called report to nurse, Lenna Sciara.

## 2021-03-27 NOTE — Assessment & Plan Note (Signed)
She has severe, profound weight loss, initially due to undiagnosed cancer and now inability to swallow with cachexia On February 23, she weighed approximately 223 pounds As of last week, she weighed only 169 pounds She would benefit from dietitian consultation while hospitalized and may need parenteral nutritional support

## 2021-03-27 NOTE — Telephone Encounter (Signed)
RN spoke with patient to follow up with recent issues of diarrhea.   Pt reports diarrhea has resolved and she is no longer using Imodium.   Patient reports she is having trouble swallowing and is unable to drink fluids at home.   RN reviewed with MD -  Apts scheduled for IVF's and MD will assess during infusion.    Patient aware of appointments.

## 2021-03-27 NOTE — Progress Notes (Signed)
Pt taken to admitting with port still accessed, but saline locked. 1L NS finished, pt to receive other 1L on floor. Report called by Katheren Puller, RN, pt family present at time of discharge to hospital.

## 2021-03-27 NOTE — Telephone Encounter (Signed)
Call from Dr. Alvy Bimler for Direct Admit from [facility]  64yow metastatic uterine cancer, clinically responding to chemo per Dr. Alvy Bimler, seen in [facility] today, has not been taking meds greater than a month (liquid form too expensive), can't eat, can't swallow, has lost 60lb in last 3 months from not eating and cancer. Profound symptoms of esophagitis.  Getting fluids and pain medicine in clinic  Exam per Dr Alvy Bimler VSS 117/80, 72, 98% RA, 16 Exam nontoxic  Will accept to telemetry bed Plan per Dr Alvy Bimler   CT imaging of the chest, abdomen and pelvis for staging and assessment of response to chemotherapy (noncontrast based on national shortage)  Continuous IV fluid support for dehydration  Dietitian consultation for severe malnutrition; she will likely need further electrolyte replacement therapy  GI consult for evaluation of esophagitis and dysphagia  IV pain management for supportive care  Murray Hodgkins, MD [facility]

## 2021-03-27 NOTE — Progress Notes (Signed)
[facility] OFFICE PROGRESS NOTE  Patient Care Team: [name], FNP as PCP - General (Family Medicine) Awanda Mink Craige Cotta, RN as Oncology Nurse Navigator (Oncology)  ASSESSMENT & PLAN:  Uterine cancer [facility]) She is getting worse clinically She has not been able to eat and drink over the weekend and is profoundly dehydrated She is very weak and could hardly talk Her niece is concerned about her wellbeing Apparently, the patient has not been truthful in her previous visit She has not been able to fill all her medications that were switched to liquid preparation due to increased cost with liquid preparation She is in pain today Due to her inability to swallow whatsoever, it is not feasible to let her go home Recommend admission to the hospital for further evaluation She will need CT imaging of the chest, abdomen and pelvis to assess response to therapy She would need continuous IV fluid hydration, electrolyte replacement therapy and IV pain management along with antiacid medication for esophagitis She would need GI consult for further evaluation of her severe dysphagia and esophagitis The patient and her niece are in agreement to be admitted I will make arrangement to see if she can be admitted to the hospitalist service and if not, she would need to be directed to the emergency department   Esophagitis She has profound symptoms of esophagitis She has not been taking any of her antiacid medications due to inability to fill those prescriptions She would need IV antiacid preparation while admitted as well as GI work-up  Dysphagia This could be related to esophagitis or cancer She would need GI work-up  Cancer associated pain She has severe esophageal pain and right upper quadrant pain and not able to take pain medicine We will start her on some IV pain medicine at the cancer center  Abnormal weight loss She has severe, profound weight loss, initially due to  undiagnosed cancer and now inability to swallow with cachexia On February 23, she weighed approximately 223 pounds As of last week, she weighed only 169 pounds She would benefit from dietitian consultation while hospitalized and may need parenteral nutritional support  Goals of care, counseling/discussion I have reviewed goals of care discussion in the purpose of admission with the patient and her niece and they are both in agreement to be admitted She would need CT imaging of the chest, abdomen and pelvis for staging and assessment of response to chemotherapy She would need continuous IV fluid support for dehydration She would need dietitian consultation for severe malnutrition; she will likely need further electrolyte replacement therapy She would need GI consult for evaluation of esophagitis and dysphagia She would need IV pain management for supportive care The risk of not admitting her would be risk of death   No orders of the defined types were placed in this encounter.   All questions were answered. The patient knows to call the clinic with any problems, questions or concerns. The total time spent in the appointment was 40 minutes encounter with patients including review of chart and various tests results, discussions about plan of care and coordination of care plan   [name], MD 03/27/2021 2:48 PM  INTERVAL HISTORY: Please see below for problem oriented charting. She is seen in the infusion room This morning, we received telephone call that the patient has not been feeling well Since chemotherapy, she has severe diarrhea and when she start taking Imodium, the diarrhea has stopped She is here accompanied by her niece  who reported that the patient has not been eating whatsoever since last week She could barely drink a cup of water due to severe esophageal pain She has not been taking any of her pain medicine because of inability to swallow Recently, her antiacid medicine was  switched to liquid preparation but due to the cost associated with liquid preparation, she did not fill those prescription The patient nodded and concur with the narrative from her niece She has significant throat pain and unable to speak  SUMMARY OF ONCOLOGIC HISTORY: Oncology History Overview Note  Adenocarcinoma with component of squamous differentiation and serous component  HER2 negative IHC MMR intact   Uterine cancer (HCC)  11/23/2020 Pathology Results   FINAL MICROSCOPIC DIAGNOSIS:  - Malignant cells consistent with non-small cell carcinoma  - See comment   SPECIMEN ADEQUACY:  Satisfactory for evaluation   DIAGNOSTIC COMMENTS:  Morphologically, the malignant cells are suggestive of an adenocarcinoma.  However, immunohistochemical stains show that the tumor cells are positive for CK7 with patchy labeling for p63 and CK 5/6 whereas they are negative for TTF-1, Napsin-A, CK20, calretinin, D2-40, GATA3, ER and CDX2.  Ki-67 stain shows increased proliferative index. Positive staining for CK5/6 and p63 is suggestive of elements of squamous differentiation.   11/23/2020 Imaging   Ct chest 1. Near complete evacuation of the right pleural effusion after right-sided pigtail drainage catheter placement. Trace residual effusion is partially loculated. 2. Dense consolidation throughout the right lung, greatest at the right lung base. Findings are consistent with a combination of pneumonia and atelectasis. Follow-up imaging after completion of appropriate medical management is recommended to exclude underlying neoplasm. 3. Indeterminate hypodensity right lobe liver. If further evaluation is desired, dedicated liver CT or MRI may be useful.   12/08/2020 PET scan   1. Unusual pattern of hypermetabolic metastatic disease. 2. Intense metabolic activity associated with pleural thickening in the RIGHT hemithorax consistent with pleural metastasis versus primary pleural malignancy. 3. Isolated LEFT  internal mammary nodal metastasis. 4. Intensely hypermetabolic lesion within the liver beneath the hypermetabolic lesions within the diaphragm. Differential includes local extension of pleural metastasis, hematogenous liver metastasis versus hepatic abscess. Favor hematogenous liver metastasis.  5. Hypermetabolic retroperitoneal metastatic adenopathy and internal iliac adenopathy. 6. Hypermetabolic thickening within the LEFT rectus muscle consistent with muscular skeletal metastasis. 7. Intense metabolic activity within enlarged uterus. Recommend gyn evaluation and potential MRI of the uterus to evaluate for primary uterine carcinoma. Unusual site of metastasis. Benign leiomyoma can be hypermetabolic but typically not this intense. 8. Rim of hypermetabolic activity in the vagina towards the introitus. Recommend evaluation as above.   12/20/2020 Initial Diagnosis   Uterine cancer (HCC)   12/26/2020 Pathology Results   A. ENDOCERVIX, CURETTAGE:  - Scant benign squamous epithelium.   B. ENDOMETRIUM, BIOPSY:  - Adenocarcinoma, see comment.   COMMENT:   B. There are malignant glands some which have marked cytologic atypia. There if focal squamous differentiation. The tumor is positive for cytokeratin 7, PAX-8, GATA-3, ER (10% strong), p53, and WT-1. The cells are negative for cytokeratin 20, CDX-2, TTF-1, and NapsinA. The overall findings are consistent with a gynecologic adenocarcinoma. While squamous differentiation is most commonly seen in endometrioid/endocervical adenocarcinoma, the strong p53 staining and focal marked atypia is suggestive of a serous component.    01/03/2021 Cancer Staging   Staging form: Corpus Uteri - Carcinoma and Carcinosarcoma, AJCC 8th Edition - Clinical: FIGO Stage IVB (cT3b, cN2a, pM1) - Signed by Artis Delay, MD on 01/03/2021  01/16/2021 Procedure   Successful placement of a right internal jugular approach power injectable Port-A-Cath. The catheter is ready for immediate  use.   01/17/2021 -  Chemotherapy    Patient is on Treatment Plan: UTERINE CARBOPLATIN AUC 6 / PACLITAXEL Q21D      02/25/2021 Imaging   1. No evidence of pulmonary embolus. 2. Persistent right pleural thickening and loculated right pleural effusion, without significant change since previous PET scan. 3. Decreased prominence of the hypodensities along the superior aspect right lobe liver, likely representing response to therapy. 4. Indeterminate sclerotic focus along the inferior endplate of the P29 vertebral body, new since prior study. This would be an unusual location for bony metastases, and may reflect developing Schmorl's node.   Metastasis to liver ([place])  01/03/2021 Initial Diagnosis   Metastasis to liver ([place])   01/17/2021 -  Chemotherapy    Patient is on Treatment Plan: UTERINE CARBOPLATIN AUC 6 / PACLITAXEL Q21D        REVIEW OF SYSTEMS:   Constitutional: Denies fevers, chills Eyes: Denies blurriness of vision Ears, nose, mouth, throat, and face: Denies mucositis or sore throat Respiratory: Denies cough, dyspnea or wheezes Cardiovascular: Denies palpitation, chest discomfort or lower extremity swelling Skin: Denies abnormal skin rashes Lymphatics: Denies new lymphadenopathy or easy bruising Neurological:Denies numbness, tingling or new weaknesses Behavioral/Psych: Mood is stable, no new changes  All other systems were reviewed with the patient and are negative.  I have reviewed the past medical history, past surgical history, social history and family history with the patient and they are unchanged from previous note.  ALLERGIES:  is allergic to eggs or egg-derived products, influenza vaccines, peanut-containing drug products, penicillins, sulfa antibiotics, and codeine.  MEDICATIONS:  Current Outpatient Medications  Medication Sig Dispense Refill  . Alum & Mag Hydroxide-Simeth (MYLANTA PO) Take 1 Dose by mouth as needed (stomach upset, heartburn, indigestion.).    Marland Kitchen  calcium carbonate (TUMS - DOSED IN MG ELEMENTAL CALCIUM) 500 MG chewable tablet Chew 1 tablet by mouth in the morning and at bedtime.    . carvedilol (COREG) 12.5 MG tablet Take 12.5 mg by mouth 2 (two) times daily with a meal.    . Cholecalciferol (VITAMIN D) 125 MCG (5000 UT) CAPS Take 5,000 Units by mouth daily.    Marland Kitchen dexamethasone (DECADRON) 4 MG tablet TAKE 2 TABLETS BY MOUTH NIGHT BEFORE AND 2 TABLETS IN THE MORNING OF CHEMO EVERY 3 WEEKS FOR 6 CYCLES 36 tablet 6  . famotidine (PEPCID) 40 MG/5ML suspension Take 2.5 mLs (20 mg total) by mouth 2 (two) times daily as needed (Heartburn). 150 mL 3  . FLUoxetine (PROZAC) 20 MG/5ML solution Take 10 mLs (40 mg total) by mouth daily. 300 mL 3  . HYDROmorphone (DILAUDID) 4 MG tablet TAKE 1 TABLET BY MOUTH EVERY 4 HOURS AS NEEDED FOR SEVERE PAIN. 60 tablet 0  . lidocaine (LIDODERM) 5 % Place 1 patch onto the skin daily. Remove & Discard patch within 12 hours or as directed by MD 30 patch 0  . lidocaine-prilocaine (EMLA) cream APPLY TO AFFECTED AREA ONCE AS DIRECTED (Patient taking differently: Apply 1 application topically once.) 30 g 3  . MELATONIN PO Take 10 mg by mouth at bedtime.    . mirtazapine (REMERON SOL-TAB) 15 MG disintegrating tablet Dissolve  1 tablet (15 mg total) in mouth at bedtime. 30 tablet 3  . ondansetron (ZOFRAN) 8 MG tablet TAKE 1 TABLET BY MOUTH EVERY 8 HOURS AS NEEDED START ON THE THIRD DAY  AFTER CHEMO (Patient taking differently: Take 8 mg by mouth every 8 (eight) hours as needed for nausea or vomiting.) 30 tablet 1  . polyethylene glycol (MIRALAX / GLYCOLAX) 17 g packet Take 17 g by mouth daily.    . prochlorperazine (COMPAZINE) 10 MG tablet TAKE 1 TABLET BY MOUTH EVERY 6 HOURS AS NEEDED FOR NAUSEA OR VOMITING (Patient taking differently: Take 10 mg by mouth every 6 (six) hours as needed for nausea or vomiting.) 30 tablet 1   No current facility-administered medications for this visit.   Facility-Administered Medications Ordered  in Other Visits  Medication Dose Route Frequency Provider Last Rate Last Admin  . heparin lock flush 100 unit/mL  500 Units Intracatheter Once Alvy Bimler, Mathhew Buysse, MD      . sodium chloride flush (NS) 0.9 % injection 10 mL  10 mL Intracatheter Once Alvy Bimler, Sharren Schnurr, MD        PHYSICAL EXAMINATION: ECOG PERFORMANCE STATUS: 2 - Symptomatic, <50% confined to bed Blood pressure 117/80 Heart rate 72 Respiration rate 16 Oxygen saturation 98% GENERAL:alert, no distress and comfortable SKIN: skin color, texture, turgor are normal, no rashes or significant lesions EYES: normal, Conjunctiva are pale and non-injected, sclera clear OROPHARYNX:no exudate, noted dry mucous membrane NECK: supple, thyroid normal size, non-tender, without nodularity LYMPH:  no palpable lymphadenopathy in the cervical, axillary or inguinal LUNGS: clear to auscultation and percussion with normal breathing effort HEART: regular rate & rhythm and no murmurs and no lower extremity edema ABDOMEN:abdomen soft, mild tenderness to palpation in the right upper quadrant  musculoskeletal:no cyanosis of digits and no clubbing  NEURO: alert & oriented x 3 with fluent speech, no focal motor/sensory deficits  LABORATORY DATA:  I have reviewed the data as listed    Component Value Date/Time   NA 139 03/22/2021 1059   K 2.9 (L) 03/22/2021 1059   CL 91 (L) 03/22/2021 1059   CO2 32 03/22/2021 1059   GLUCOSE 170 (H) 03/22/2021 1059   BUN 10 03/22/2021 1059   CREATININE 0.71 03/22/2021 1059   CALCIUM 9.5 03/22/2021 1059   PROT 7.2 03/22/2021 1059   ALBUMIN 3.0 (L) 03/22/2021 1059   AST 33 03/22/2021 1059   ALT 34 03/22/2021 1059   ALKPHOS 72 03/22/2021 1059   BILITOT 0.5 03/22/2021 1059   GFRNONAA >60 03/22/2021 1059    No results found for: SPEP, UPEP  Lab Results  Component Value Date   WBC 5.9 03/22/2021   NEUTROABS 4.4 03/22/2021   HGB 11.1 (L) 03/22/2021   HCT 32.5 (L) 03/22/2021   MCV 82.3 03/22/2021   PLT 153 03/22/2021       Chemistry      Component Value Date/Time   NA 139 03/22/2021 1059   K 2.9 (L) 03/22/2021 1059   CL 91 (L) 03/22/2021 1059   CO2 32 03/22/2021 1059   BUN 10 03/22/2021 1059   CREATININE 0.71 03/22/2021 1059      Component Value Date/Time   CALCIUM 9.5 03/22/2021 1059   ALKPHOS 72 03/22/2021 1059   AST 33 03/22/2021 1059   ALT 34 03/22/2021 1059   BILITOT 0.5 03/22/2021 1059       RADIOGRAPHIC STUDIES: I have personally reviewed the radiological images as listed and agreed with the findings in the report. CT Angio Chest PE W and/or Wo Contrast  Result Date: 02/25/2021 CLINICAL DATA:  History of metastatic uterine cancer with malignant right pleural effusion, short of breath EXAM: CT ANGIOGRAPHY CHEST WITH CONTRAST TECHNIQUE: Multidetector CT imaging  of the chest was performed using the standard protocol during bolus administration of intravenous contrast. Multiplanar CT image reconstructions and MIPs were obtained to evaluate the vascular anatomy. CONTRAST:  153mL OMNIPAQUE IOHEXOL 350 MG/ML SOLN COMPARISON:  11/23/2020, 12/08/2020 FINDINGS: Cardiovascular: This is a technically adequate evaluation of the pulmonary vasculature. No filling defects or pulmonary emboli. The heart is unremarkable without pericardial effusion. No evidence of thoracic aortic aneurysm or dissection. Right chest wall port via internal jugular approach, tip within the superior vena cava. Mediastinum/Nodes: No enlarged mediastinal, hilar, or axillary lymph nodes. Thyroid gland, trachea, and esophagus demonstrate no significant findings. Lungs/Pleura: Diffuse right pleural thickening consistent with known pleural metastases. Loculated right pleural effusion is noted, not appreciably changed since prior PET scan. Minimal atelectasis within the right lower lobe. No acute airspace disease. No pneumothorax. The central airways are patent. Upper Abdomen: The hypodensities within the right lobe liver seen on previous  studies are not as well visualized on this exam, may reflect decreased size due to response to therapy. The subcapsular hypodensity measures 2 cm on today's exam, previously having measured up to 4 cm. Trace Peri hepatic fluid under the right hemidiaphragm. Musculoskeletal: There is new sclerosis along the inferior endplate of the J25 vertebral body, without overlying bony destruction. This is nonspecific. No other acute or destructive bony lesions. Reconstructed images demonstrate no additional findings. Review of the MIP images confirms the above findings. IMPRESSION: 1. No evidence of pulmonary embolus. 2. Persistent right pleural thickening and loculated right pleural effusion, without significant change since previous PET scan. 3. Decreased prominence of the hypodensities along the superior aspect right lobe liver, likely representing response to therapy. 4. Indeterminate sclerotic focus along the inferior endplate of the G87 vertebral body, new since prior study. This would be an unusual location for bony metastases, and may reflect developing Schmorl's node. Electronically Signed   By: Randa Ngo M.D.   On: 02/25/2021 17:32

## 2021-03-27 NOTE — Assessment & Plan Note (Signed)
She has severe esophageal pain and right upper quadrant pain and not able to take pain medicine We will start her on some IV pain medicine at the cancer center

## 2021-03-27 NOTE — Assessment & Plan Note (Signed)
She has profound symptoms of esophagitis She has not been taking any of her antiacid medications due to inability to fill those prescriptions She would need IV antiacid preparation while admitted as well as GI work-up

## 2021-03-27 NOTE — Assessment & Plan Note (Signed)
This could be related to esophagitis or cancer She would need GI work-up

## 2021-03-27 NOTE — H&P (Signed)
History and Physical    Yvette Roberts JJO:[id number] DOB: 04-15-1956 DOA: 03/27/2021  Referring MD/NP/PA: Dr. [facility] PCP:  Patient coming from: Cancer center  Chief Complaint: Weakness, ongoing weight loss, failure to thrive, minimal p.o. intake  HPI: Yvette Roberts is a 65 year old female with prior history of hypertension and type 2 diabetes mellitus, no longer on meds, stage IV uterine cancer, history of malignant pleural effusion diagnosed in 2/22, followed by Dr. Alvy Bimler currently on chemotherapy, has had multiple symptoms most notably severe odynophagia, with resultant minimal oral intake and profound weight loss. -She had been prescribed liquid medications for supportive care however unable to get them. -Reportedly lost 50 to 60 pounds in the last 3 months, her main complaint is severe odynophagia and pain with any attempt at swallowing, she also has mild lower abdominal discomfort which is somewhat chronic and unchanged. -Sent from the cancer center directly to the floor, labs and work-up pending  Review of Systems: As per HPI otherwise 14 point review of systems negative.   Past Medical History:  Diagnosis Date  . Allergy   . Arthritis   . Cancer ([place])   . COPD (chronic obstructive pulmonary disease) ([place])   . Depression   . Esophageal reflux 07/21/2014  . Frozen shoulder 05/10/2015   Injected 05/10/2015   . Obesity, unspecified 07/21/2014  . Resistant hypertension 07/21/2014  . Type 2 diabetes mellitus without complication ([place]) 1/60/1093    Past Surgical History:  Procedure Laterality Date  . IR IMAGING GUIDED PORT INSERTION  01/16/2021  . LAPAROSCOPIC ABDOMINAL EXPLORATION    . miscarr       reports that she has never smoked. She has never used smokeless tobacco. No history on file for alcohol use and drug use.  Allergies  Allergen Reactions  . Eggs Or Egg-Derived Products Hives  . Influenza Vaccines Hives  . Peanut-Containing Drug  Products Hives  . Penicillins Hives  . Sulfa Antibiotics Hives  . Codeine Rash    Family History  Problem Relation Age of Onset  . Breast cancer Sister        ? age of onset  . Breast cancer Maternal Aunt   . Breast cancer Maternal Aunt   . Breast cancer Maternal Aunt      Prior to Admission medications   Medication Sig Start Date End Date Taking? Authorizing Provider  Alum & Mag Hydroxide-Simeth (MYLANTA PO) Take 1 Dose by mouth as needed (stomach upset, heartburn, indigestion.).    [provider]  amLODipine (NORVASC) 5 MG tablet Take 5 mg by mouth daily. 03/16/21   [provider]  calcium carbonate (TUMS - DOSED IN MG ELEMENTAL CALCIUM) 500 MG chewable tablet Chew 1 tablet by mouth in the morning and at bedtime.    [provider]  carvedilol (COREG) 12.5 MG tablet Take 12.5 mg by mouth 2 (two) times daily with a meal.    [provider]  Cholecalciferol (VITAMIN D) 125 MCG (5000 UT) CAPS Take 5,000 Units by mouth daily.    [provider]  dexamethasone (DECADRON) 4 MG tablet TAKE 2 TABLETS BY MOUTH NIGHT BEFORE AND 2 TABLETS IN THE MORNING OF CHEMO EVERY 3 WEEKS FOR 6 CYCLES 03/22/21 03/22/22  [name], MD  famotidine (PEPCID) 20 MG tablet Take 20 mg by mouth 2 (two) times daily. 03/19/21   [provider]  famotidine (PEPCID) 40 MG/5ML suspension Take 2.5 mLs (20 mg total) by mouth 2 (two) times daily  as needed (Heartburn). 03/12/21     FLUoxetine (PROZAC) 20 MG/5ML solution Take 10 mLs (40 mg total) by mouth daily. 03/12/21     HYDROmorphone (DILAUDID) 4 MG tablet TAKE 1 TABLET BY MOUTH EVERY 4 HOURS AS NEEDED FOR SEVERE PAIN. 03/13/21 09/09/21  [name], MD  lidocaine (LIDODERM) 5 % Place 1 patch onto the skin daily. Remove & Discard patch within 12 hours or as directed by MD 03/01/21   [name], MD  lidocaine-prilocaine (EMLA) cream APPLY TO AFFECTED AREA ONCE AS DIRECTED Patient taking differently: Apply 1 application  topically once. 01/08/21 01/08/22  [name], MD  MELATONIN PO Take 10 mg by mouth at bedtime.    [provider]  mirtazapine (REMERON SOL-TAB) 15 MG disintegrating tablet Dissolve  1 tablet (15 mg total) in mouth at bedtime. 03/22/21   [name], MD  omeprazole (PRILOSEC) 40 MG capsule Take 40 mg by mouth daily. 03/13/21   [provider]  ondansetron (ZOFRAN) 8 MG tablet TAKE 1 TABLET BY MOUTH EVERY 8 HOURS AS NEEDED START ON THE THIRD DAY AFTER CHEMO Patient taking differently: Take 8 mg by mouth every 8 (eight) hours as needed for nausea or vomiting. 01/08/21 01/08/22  [name], MD  polyethylene glycol (MIRALAX / GLYCOLAX) 17 g packet Take 17 g by mouth daily.    [provider]  prochlorperazine (COMPAZINE) 10 MG tablet TAKE 1 TABLET BY MOUTH EVERY 6 HOURS AS NEEDED FOR NAUSEA OR VOMITING Patient taking differently: Take 10 mg by mouth every 6 (six) hours as needed for nausea or vomiting. 01/08/21 01/08/22  [name], MD    Physical Exam: There were no vitals filed for this visit.  Frail chronically ill female appears much older than stated age,  HEENT: Positive pallor, neck no JVD, oral mucosa with thick whitish coating on her tongue and thrush noted on her palate CVS: S1-S2, regular rhythm Lungs: Decreased breath sounds to bases otherwise clear Abdomen: Soft, nontender, bowel sounds present Extremities: No edema Skin: No rashes on exposed skin Neuro: Moves all extremities, no localizing signs Psych: Flat affect  Labs on Admission: I have personally reviewed following labs and imaging studies  CBC: Recent Labs  Lab 03/22/21 1059  WBC 5.9  NEUTROABS 4.4  HGB 11.1*  HCT 32.5*  MCV 82.3  PLT 009   Basic Metabolic Panel: Recent Labs  Lab 03/22/21 1059  NA 139  K 2.9*  CL 91*  CO2 32  GLUCOSE 170*  BUN 10  CREATININE 0.71  CALCIUM 9.5  MG 1.5*   GFR: Estimated Creatinine Clearance: 73.4 mL/min (by C-G formula based on SCr of 0.71  mg/dL). Liver Function Tests: Recent Labs  Lab 03/22/21 1059  AST 33  ALT 34  ALKPHOS 72  BILITOT 0.5  PROT 7.2  ALBUMIN 3.0*   No results for input(s): LIPASE, AMYLASE in the last 168 hours. No results for input(s): AMMONIA in the last 168 hours. Coagulation Profile: No results for input(s): INR, PROTIME in the last 168 hours. Cardiac Enzymes: No results for input(s): CKTOTAL, CKMB, CKMBINDEX, TROPONINI in the last 168 hours. BNP (last 3 results) No results for input(s): PROBNP in the last 8760 hours. HbA1C: No results for input(s): HGBA1C in the last 72 hours. CBG: No results for input(s): GLUCAP in the last 168 hours. Lipid Profile: No results for input(s): CHOL, HDL, LDLCALC, TRIG, CHOLHDL, LDLDIRECT in the last 72 hours. Thyroid Function Tests: No results for input(s): TSH, T4TOTAL, FREET4, T3FREE, THYROIDAB in  the last 72 hours. Anemia Panel: No results for input(s): VITAMINB12, FOLATE, FERRITIN, TIBC, IRON, RETICCTPCT in the last 72 hours. Urine analysis:    Component Value Date/Time   BILIRUBINUR n 03/06/2015 1138   PROTEINUR n 03/06/2015 1138   UROBILINOGEN 1.0 03/06/2015 1138   NITRITE n 03/06/2015 1138   LEUKOCYTESUR Negative 03/06/2015 1138   Sepsis Labs: @LABRCNTIP (procalcitonin:4,lacticidven:4) )No results found for this or any previous visit (from the past 240 hour(s)).   Radiological Exams on Admission: No results found.   Assessment/Plan  Severe odynophagia Multifactorial weight loss, adult failure to thrive Severe protein calorie malnutrition -Clinically suspect Candida esophagitis, she has oral thrush -Start IV fluconazole, IV PPI, clear liquids -Obtain labs today, if kidney function is stable, Will need CT chest abdomen pelvis for staging -Gastroenterology consulted for endoscopy, [facility] will see in a.m. -Dietitian consulted, check prealbumin -Oncology Dr.Gorsuch to follow  Stage IV uterine cancer History of malignant pleural  effusion -Obtain staging CT for response to therapy after creatinine/labs back -Supportive care, pain control  Type 2 diabetes mellitus without complication (HCC) -No longer on meds, check hemoglobin A1c  Further work-up based on labs as well  DVT prophylaxis: Lovenox Code Status: Full code, wants to discuss CODE STATUS further with her son and let us know tomorrow Family Communication: No family at bedside, discussed with patient in detail Disposition Plan: Home pending clinical improvement Consults called: [facility], Dr. Alessandra Bevels Admission status: Inpatient  Domenic Polite MD [facility]   03/27/2021, 5:40 PM

## 2021-03-27 NOTE — Assessment & Plan Note (Signed)
I have reviewed goals of care discussion in the purpose of admission with the patient and her niece and they are both in agreement to be admitted She would need CT imaging of the chest, abdomen and pelvis for staging and assessment of response to chemotherapy She would need continuous IV fluid support for dehydration She would need dietitian consultation for severe malnutrition; she will likely need further electrolyte replacement therapy She would need GI consult for evaluation of esophagitis and dysphagia She would need IV pain management for supportive care The risk of not admitting her would be risk of death

## 2021-03-27 NOTE — Assessment & Plan Note (Signed)
She is getting worse clinically She has not been able to eat and drink over the weekend and is profoundly dehydrated She is very weak and could hardly talk Her niece is concerned about her wellbeing Apparently, the patient has not been truthful in her previous visit She has not been able to fill all her medications that were switched to liquid preparation due to increased cost with liquid preparation She is in pain today Due to her inability to swallow whatsoever, it is not feasible to let her go home Recommend admission to the hospital for further evaluation She will need CT imaging of the chest, abdomen and pelvis to assess response to therapy She would need continuous IV fluid hydration, electrolyte replacement therapy and IV pain management along with antiacid medication for esophagitis She would need GI consult for further evaluation of her severe dysphagia and esophagitis The patient and her niece are in agreement to be admitted I will make arrangement to see if she can be admitted to the hospitalist service and if not, she would need to be directed to the emergency department

## 2021-03-27 NOTE — Patient Instructions (Signed)
Rehydration, Adult Rehydration is the replacement of body fluids, salts, and minerals (electrolytes) that are lost during dehydration. Dehydration is when there is not enough water or other fluids in the body. This happens when you lose more fluids than you take in. Common causes of dehydration include:  Not drinking enough fluids. This can occur when you are ill or doing activities that require a lot of energy, especially in hot weather.  Conditions that cause loss of water or other fluids, such as diarrhea, vomiting, sweating, or urinating a lot.  Other illnesses, such as fever or infection.  Certain medicines, such as those that remove excess fluid from the body (diuretics). Symptoms of mild or moderate dehydration may include thirst, dry lips and mouth, and dizziness. Symptoms of severe dehydration may include increased heart rate, confusion, fainting, and not urinating. For severe dehydration, you may need to get fluids through an IV at the hospital. For mild or moderate dehydration, you can usually rehydrate at home by drinking certain fluids as told by your health care provider. What are the risks? Generally, rehydration is safe. However, taking in too much fluid (overhydration) can be a problem. This is rare. Overhydration can cause an electrolyte imbalance, kidney failure, or a decrease in salt (sodium) levels in the body. Supplies needed You will need an oral rehydration solution (ORS) if your health care provider tells you to use one. This is a drink to treat dehydration. It can be found in pharmacies and retail stores. How to rehydrate Fluids Follow instructions from your health care provider for rehydration. The kind of fluid and the amount you should drink depend on your condition. In general, you should choose drinks that you prefer.  If told by your health care provider, drink an ORS. ? Make an ORS by following instructions on the package. ? Start by drinking small amounts,  about  cup (120 mL) every 5-10 minutes. ? Slowly increase how much you drink until you have taken the amount recommended by your health care provider.  Drink enough clear fluids to keep your urine pale yellow. If you were told to drink an ORS, finish it first, then start slowly drinking other clear fluids. Drink fluids such as: ? Water. This includes sparkling water and flavored water. Drinking only water can lead to having too little sodium in your body (hyponatremia). Follow the advice of your health care provider. ? Water from ice chips you suck on. ? Fruit juice with water you add to it (diluted). ? Sports drinks. ? Hot or cold herbal teas. ? Broth-based soups. ? Milk or milk products. Food Follow instructions from your health care provider about what to eat while you rehydrate. Your health care provider may recommend that you slowly begin eating regular foods in small amounts.  Eat foods that contain a healthy balance of electrolytes, such as bananas, oranges, potatoes, tomatoes, and spinach.  Avoid foods that are greasy or contain a lot of sugar. In some cases, you may get nutrition through a feeding tube that is passed through your nose and into your stomach (nasogastric tube, or NG tube). This may be done if you have uncontrolled vomiting or diarrhea.   Beverages to avoid Certain beverages may make dehydration worse. While you rehydrate, avoid drinking alcohol.   How to tell if you are recovering from dehydration You may be recovering from dehydration if:  You are urinating more often than before you started rehydrating.  Your urine is pale yellow.  Your energy level   improves.  You vomit less frequently.  You have diarrhea less frequently.  Your appetite improves or returns to normal.  You feel less dizzy or less light-headed.  Your skin tone and color start to look more normal. Follow these instructions at home:  Take over-the-counter and prescription medicines only  as told by your health care provider.  Do not take sodium tablets. Doing this can lead to having too much sodium in your body (hypernatremia). Contact a health care provider if:  You continue to have symptoms of mild or moderate dehydration, such as: ? Thirst. ? Dry lips. ? Slightly dry mouth. ? Dizziness. ? Dark urine or less urine than normal. ? Muscle cramps.  You continue to vomit or have diarrhea. Get help right away if you:  Have symptoms of dehydration that get worse.  Have a fever.  Have a severe headache.  Have been vomiting and the following happens: ? Your vomiting gets worse or does not go away. ? Your vomit includes blood or green matter (bile). ? You cannot eat or drink without vomiting.  Have problems with urination or bowel movements, such as: ? Diarrhea that gets worse or does not go away. ? Blood in your stool (feces). This may cause stool to look black and tarry. ? Not urinating, or urinating only a small amount of very dark urine, within 6-8 hours.  Have trouble breathing.  Have symptoms that get worse with treatment. These symptoms may represent a serious problem that is an emergency. Do not wait to see if the symptoms will go away. Get medical help right away. Call your local emergency services (911 in the [place] Do not drive yourself to the hospital. Summary  Rehydration is the replacement of body fluids and minerals (electrolytes) that are lost during dehydration.  Follow instructions from your health care provider for rehydration. The kind of fluid and amount you should drink depend on your condition.  Slowly increase how much you drink until you have taken the amount recommended by your health care provider.  Contact your health care provider if you continue to show signs of mild or moderate dehydration. This information is not intended to replace advice given to you by your health care provider. Make sure you discuss any questions you have with  your health care provider. Document Revised: 12/15/2019 Document Reviewed: 10/25/2019 Elsevier Patient Education  2021 [employer]  

## 2021-03-27 NOTE — Progress Notes (Signed)
Patient presented to infusion room for IVF's per Dr. Calton Dach request. Patient called earlier in the day c/o difficulty swallowing leading to decreased oral intake. IVF's given and plans made for inpatient admission. Patient aware of plan of care and agrees.

## 2021-03-28 ENCOUNTER — Encounter (HOSPITAL_COMMUNITY): Payer: Self-pay | Admitting: Internal Medicine

## 2021-03-28 ENCOUNTER — Inpatient Hospital Stay (HOSPITAL_COMMUNITY): Payer: Medicare Other

## 2021-03-28 ENCOUNTER — Other Ambulatory Visit: Payer: Self-pay

## 2021-03-28 LAB — CBC
HCT: 29.8 % — ABNORMAL LOW (ref 36.0–46.0)
Hemoglobin: 9.6 g/dL — ABNORMAL LOW (ref 12.0–15.0)
MCH: 27.8 pg (ref 26.0–34.0)
MCHC: 32.2 g/dL (ref 30.0–36.0)
MCV: 86.4 fL (ref 80.0–100.0)
Platelets: 110 10*3/uL — ABNORMAL LOW (ref 150–400)
RBC: 3.45 MIL/uL — ABNORMAL LOW (ref 3.87–5.11)
RDW: 16.1 % — ABNORMAL HIGH (ref 11.5–15.5)
WBC: 1.9 10*3/uL — ABNORMAL LOW (ref 4.0–10.5)
nRBC: 0 % (ref 0.0–0.2)

## 2021-03-28 LAB — COMPREHENSIVE METABOLIC PANEL
ALT: 67 U/L — ABNORMAL HIGH (ref 0–44)
AST: 70 U/L — ABNORMAL HIGH (ref 15–41)
Albumin: 3.1 g/dL — ABNORMAL LOW (ref 3.5–5.0)
Alkaline Phosphatase: 46 U/L (ref 38–126)
Anion gap: 11 (ref 5–15)
BUN: 10 mg/dL (ref 8–23)
CO2: 33 mmol/L — ABNORMAL HIGH (ref 22–32)
Calcium: 8.5 mg/dL — ABNORMAL LOW (ref 8.9–10.3)
Chloride: 93 mmol/L — ABNORMAL LOW (ref 98–111)
Creatinine, Ser: 0.47 mg/dL (ref 0.44–1.00)
GFR, Estimated: >60 mL/min (ref >60)
Glucose, Bld: 105 mg/dL — ABNORMAL HIGH (ref 70–99)
Potassium: 2.2 mmol/L — CL (ref 3.5–5.1)
Sodium: 137 mmol/L (ref 135–145)
Total Bilirubin: 1 mg/dL (ref 0.3–1.2)
Total Protein: 6.2 g/dL — ABNORMAL LOW (ref 6.5–8.1)

## 2021-03-28 LAB — HEMOGLOBIN A1C
Hgb A1c MFr Bld: 6.1 % — ABNORMAL HIGH (ref 4.8–5.6)
Mean Plasma Glucose: 128 mg/dL

## 2021-03-28 LAB — SARS CORONAVIRUS 2 (TAT 6-24 HRS): SARS Coronavirus 2: NEGATIVE

## 2021-03-28 LAB — MAGNESIUM: Magnesium: 1.6 mg/dL — ABNORMAL LOW (ref 1.7–2.4)

## 2021-03-28 MED ORDER — GABAPENTIN 100 MG PO CAPS
200.0000 mg | ORAL_CAPSULE | Freq: Two times a day (BID) | ORAL | Status: DC
  Administered 2021-03-28 – 2021-04-28 (×63): 200 mg via ORAL
  Filled 2021-03-28 (×63): qty 2

## 2021-03-28 MED ORDER — IOHEXOL 9 MG/ML PO SOLN: 500.0000 mL | ORAL | Status: AC

## 2021-03-28 MED ORDER — SODIUM CHLORIDE (PF) 0.9 % IJ SOLN
INTRAMUSCULAR | Status: AC
  Filled 2021-03-28: qty 50

## 2021-03-28 MED ORDER — POTASSIUM CHLORIDE CRYS ER 20 MEQ PO TBCR
40.0000 meq | EXTENDED_RELEASE_TABLET | ORAL | Status: DC
  Filled 2021-03-28: qty 2

## 2021-03-28 MED ORDER — MAGNESIUM SULFATE 2 GM/50ML IV SOLN
2.0000 g | Freq: Once | INTRAVENOUS | Status: AC
  Administered 2021-03-28: 2 g via INTRAVENOUS
  Filled 2021-03-28: qty 50

## 2021-03-28 MED ORDER — POTASSIUM CHLORIDE 10 MEQ/100ML IV SOLN
10.0000 meq | INTRAVENOUS | Status: AC
  Administered 2021-03-28 (×3): 10 meq via INTRAVENOUS
  Filled 2021-03-28: qty 100

## 2021-03-28 MED ORDER — SODIUM CHLORIDE 0.9 % IV SOLN: INTRAVENOUS | Status: AC

## 2021-03-28 MED ORDER — IOHEXOL 300 MG/ML  SOLN
100.0000 mL | Freq: Once | INTRAMUSCULAR | Status: AC | PRN
  Administered 2021-03-28: 100 mL via INTRAVENOUS

## 2021-03-28 MED ORDER — IOHEXOL 9 MG/ML PO SOLN
ORAL | Status: AC
  Administered 2021-03-28: 500 mL
  Filled 2021-03-28: qty 1000

## 2021-03-28 MED ORDER — NYSTATIN 100000 UNIT/ML MT SUSP
5.0000 mL | Freq: Four times a day (QID) | OROMUCOSAL | Status: DC
  Administered 2021-03-28 – 2021-04-08 (×39): 500000 [IU] via ORAL
  Filled 2021-03-28 (×38): qty 5

## 2021-03-28 MED ORDER — PANTOPRAZOLE SODIUM 40 MG IV SOLR
40.0000 mg | Freq: Two times a day (BID) | INTRAVENOUS | Status: DC
  Administered 2021-03-28 – 2021-03-29 (×3): 40 mg via INTRAVENOUS
  Filled 2021-03-28 (×2): qty 40

## 2021-03-28 MED ORDER — CHLORHEXIDINE GLUCONATE CLOTH 2 % EX PADS
6.0000 | MEDICATED_PAD | Freq: Every day | CUTANEOUS | Status: DC
  Administered 2021-03-28 – 2021-04-28 (×32): 6 via TOPICAL

## 2021-03-28 MED ORDER — MAGIC MOUTHWASH W/LIDOCAINE
2.0000 mL | Freq: Three times a day (TID) | ORAL | Status: DC
  Administered 2021-03-28 – 2021-04-18 (×55): 2 mL via ORAL
  Filled 2021-03-28 (×65): qty 5

## 2021-03-28 MED ORDER — CHLORHEXIDINE GLUCONATE 0.12 % MT SOLN
15.0000 mL | Freq: Two times a day (BID) | OROMUCOSAL | Status: DC
  Administered 2021-03-28 – 2021-04-04 (×12): 15 mL via OROMUCOSAL
  Filled 2021-03-28 (×12): qty 15

## 2021-03-28 MED ORDER — POTASSIUM CHLORIDE 20 MEQ PO PACK
40.0000 meq | PACK | ORAL | Status: AC
  Administered 2021-03-28: 40 meq via ORAL
  Filled 2021-03-28 (×2): qty 2

## 2021-03-28 MED ORDER — ORAL CARE MOUTH RINSE
15.0000 mL | Freq: Two times a day (BID) | OROMUCOSAL | Status: DC
  Administered 2021-03-28 – 2021-04-28 (×24): 15 mL via OROMUCOSAL

## 2021-03-28 NOTE — Progress Notes (Signed)
Patient was given lovenox on MAR.

## 2021-03-28 NOTE — Consult Note (Signed)
Referring Provider: Dr. Domenic Polite Primary Care Physician:  [name], FNP Primary Gastroenterologist:  Althia Forts  Reason for Consultation: Odynophagia  HPI: Yvette Roberts is a 65 y.o. female with past medical history of DM type 2, HTN, stage IV uterine cancer (on chemotherapy), malignant pleural effusion presenting for consultation of odynophagia.  Patient reports painful swallowing for 1 week. She also states she has intermittent dysphagia.  She is unable to swallow liquids (or solids) due to pain.  Reports weight loss of 25-30 lbs in the last 1 month.  Denies nausea, vomiting, abdominal pain, changes in stool, melena, or hematochezia.  Denies ASA, NSAID, or blood thinner use.  Patient believes mother had an esophageal stricture but no other known family history of gastrointestinal disorders or malignancy.  Patient has never had an EGD or colonoscopy.  Past Medical History:  Diagnosis Date  . Allergy   . Arthritis   . Cancer ([place])   . COPD (chronic obstructive pulmonary disease) ([place])   . Depression   . Esophageal reflux 07/21/2014  . Frozen shoulder 05/10/2015   Injected 05/10/2015   . Obesity, unspecified 07/21/2014  . Resistant hypertension 07/21/2014  . Type 2 diabetes mellitus without complication ([place]) 8/50/2774    Past Surgical History:  Procedure Laterality Date  . IR IMAGING GUIDED PORT INSERTION  01/16/2021  . LAPAROSCOPIC ABDOMINAL EXPLORATION    . miscarr      Prior to Admission medications   Medication Sig Start Date End Date Taking? Authorizing Provider  amLODipine (NORVASC) 5 MG tablet Take 5 mg by mouth daily. 03/16/21  Yes [provider]  carvedilol (COREG) 12.5 MG tablet Take 12.5 mg by mouth 2 (two) times daily with a meal.   Yes [provider]  Cholecalciferol (VITAMIN D) 125 MCG (5000 UT) CAPS Take 5,000 Units by mouth daily.   Yes [provider]  dexamethasone (DECADRON) 4 MG tablet TAKE 2 TABLETS BY MOUTH NIGHT  BEFORE AND 2 TABLETS IN THE MORNING OF CHEMO EVERY 3 WEEKS FOR 6 CYCLES 03/22/21 03/22/22 Yes Gorsuch, Ni, MD  famotidine (PEPCID) 20 MG tablet Take 20 mg by mouth 2 (two) times daily. 03/19/21  Yes [provider]  HYDROmorphone (DILAUDID) 4 MG tablet TAKE 1 TABLET BY MOUTH EVERY 4 HOURS AS NEEDED FOR SEVERE PAIN. Patient taking differently: Take 4 mg by mouth every 4 (four) hours as needed for severe pain. 03/13/21 09/09/21 Yes Gorsuch, Ni, MD  lidocaine (LIDODERM) 5 % Place 1 patch onto the skin daily. Remove & Discard patch within 12 hours or as directed by MD 03/01/21  Yes Alvy Bimler, Ni, MD  omeprazole (PRILOSEC) 40 MG capsule Take 40 mg by mouth daily. 03/13/21  Yes [provider]  ondansetron (ZOFRAN) 8 MG tablet TAKE 1 TABLET BY MOUTH EVERY 8 HOURS AS NEEDED START ON THE THIRD DAY AFTER CHEMO Patient taking differently: Take 8 mg by mouth every 8 (eight) hours as needed for nausea or vomiting. 01/08/21 01/08/22 Yes Gorsuch, Ni, MD  prochlorperazine (COMPAZINE) 10 MG tablet TAKE 1 TABLET BY MOUTH EVERY 6 HOURS AS NEEDED FOR NAUSEA OR VOMITING Patient taking differently: Take 10 mg by mouth every 6 (six) hours as needed for nausea or vomiting. 01/08/21 01/08/22 Yes Gorsuch, Ni, MD  famotidine (PEPCID) 40 MG/5ML suspension Take 2.5 mLs (20 mg total) by mouth 2 (two) times daily as needed (Heartburn). Patient not taking: Reported on 03/27/2021 03/12/21     FLUoxetine (PROZAC) 20 MG/5ML solution Take 10 mLs (40 mg  total) by mouth daily. Patient not taking: Reported on 03/27/2021 03/12/21     lidocaine-prilocaine (EMLA) cream APPLY TO AFFECTED AREA ONCE AS DIRECTED Patient not taking: Reported on 03/27/2021 01/08/21 01/08/22  [name], MD  mirtazapine (REMERON SOL-TAB) 15 MG disintegrating tablet Dissolve  1 tablet (15 mg total) in mouth at bedtime. Patient not taking: Reported on 03/27/2021 03/22/21   [name], MD    Scheduled Meds: . carvedilol  12.5 mg Oral BID WC  . chlorhexidine  15  mL Mouth Rinse BID  . Chlorhexidine Gluconate Cloth  6 each Topical Daily  . enoxaparin (LOVENOX) injection  40 mg Subcutaneous Q24H  . FLUoxetine  20 mg Oral Daily  . iohexol  500 mL Oral Q1H  . magic mouthwash w/lidocaine  2 mL Oral TID  . mouth rinse  15 mL Mouth Rinse q12n4p  . melatonin  10 mg Oral QHS  . mirtazapine  15 mg Oral QHS  . nystatin  5 mL Oral QID  . pantoprazole (PROTONIX) IV  40 mg Intravenous Daily  . polyethylene glycol  17 g Oral Daily  . potassium chloride  40 mEq Oral Q2H   Continuous Infusions: . sodium chloride 75 mL/hr at 03/28/21 0818  . fluconazole (DIFLUCAN) IV 200 mg (03/27/21 1800)  . potassium chloride 10 mEq (03/28/21 0819)   PRN Meds:.HYDROmorphone (DILAUDID) injection, ondansetron **OR** ondansetron (ZOFRAN) IV, phenol  Allergies as of 03/27/2021 - Review Complete 03/27/2021  Allergen Reaction Noted  . Eggs or egg-derived products Hives 10/15/2016  . Influenza vaccines Hives 10/15/2016  . Peanut-containing drug products Hives 10/15/2016  . Penicillins Hives 07/21/2014  . Sulfa antibiotics Hives 07/21/2014  . Codeine Rash 04/02/2012    Family History  Problem Relation Age of Onset  . Breast cancer Sister        ? age of onset  . Breast cancer Maternal Aunt   . Breast cancer Maternal Aunt   . Breast cancer Maternal Aunt     Social History   Socioeconomic History  . Marital status: Divorced    Spouse name: Not on file  . Number of children: Not on file  . Years of education: Not on file  . Highest education level: Not on file  Occupational History  . Not on file  Tobacco Use  . Smoking status: Never Smoker  . Smokeless tobacco: Never Used  Substance and Sexual Activity  . Alcohol use: Not on file  . Drug use: Not on file  . Sexual activity: Not on file  Other Topics Concern  . 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  HENT: Positive for sore throat. Negative for hearing loss.   Eyes: Negative for pain and redness.  Respiratory: Negative for cough and shortness of breath.   Cardiovascular: Negative for chest pain and palpitations.  Gastrointestinal: Negative for abdominal pain, blood in stool, constipation, diarrhea, heartburn, melena, nausea and vomiting.  Genitourinary: Negative for flank pain and hematuria.  Musculoskeletal: Negative for falls and joint pain.  Skin: Negative for itching and rash.  Neurological: Negative for seizures and loss of consciousness.  Endo/Heme/Allergies: Negative for polydipsia. Does not bruise/bleed easily.  Psychiatric/Behavioral: Negative for substance abuse. The patient is not nervous/anxious.     Physical Exam: Vital signs: Vitals:   03/28/21 0153 03/28/21 0541  BP: 121/63 120/66  Pulse: 68 64  Resp: 18 18  Temp:  98.7 F (37.1 C)  SpO2: 100% 100%   Last BM Date: 03/26/21  Physical Exam Vitals reviewed.  Constitutional:      General: She is not in acute distress. HENT:     Head: Normocephalic and atraumatic.     Nose: Nose normal. No congestion.     Mouth/Throat:     Mouth: Mucous membranes are moist.     Pharynx: Oropharynx is clear.  Eyes:     Extraocular Movements: Extraocular movements intact.     Conjunctiva/sclera: Conjunctivae normal.  Cardiovascular:     Rate and Rhythm: Normal rate and regular rhythm.     Heart sounds: Normal heart sounds.  Pulmonary:     Effort: Pulmonary effort is normal. No  respiratory distress.  Abdominal:     General: Bowel sounds are normal. There is no distension.     Palpations: Abdomen is soft.     Tenderness: There is no abdominal tenderness. There is no guarding or rebound.     Hernia: No hernia is present.  Musculoskeletal:        General: No swelling or tenderness.     Cervical back: Normal range of motion and neck supple.  Skin:    General: Skin is warm and dry.  Neurological:     General: No focal deficit present.     Mental Status: She is alert and oriented to person, place, and time.  Psychiatric:        Mood and Affect: Mood normal.        Behavior: Behavior normal. Behavior is cooperative.   '  GI:  Lab Results: Recent Labs    03/27/21 1755 03/28/21 0500  WBC 2.6* 1.9*  HGB 9.4* 9.6*  HCT 29.0* 29.8*  PLT 108* 110*   BMET Recent Labs    03/27/21 1755 03/28/21 0500  NA 135 137  K 2.2* 2.2*  CL 92* 93*  CO2 34* 33*  GLUCOSE 114* 105*  BUN 11 10  CREATININE 0.43* 0.47  CALCIUM 8.3* 8.5*   LFT Recent Labs    03/28/21 0500  PROT 6.2*  ALBUMIN 3.1*  AST 70*  ALT 67*  ALKPHOS 46  BILITOT 1.0   PT/INR No results for input(s): LABPROT, INR in the last 72 hours.   Studies/Results: DG Chest 2 View  Result Date: 03/27/2021 CLINICAL DATA:  65 year old female with hypertension and diabetes. Stage IV uterine cancer. EXAM: CHEST - 2 VIEW COMPARISON:  Chest radiograph dated 02/25/2021. FINDINGS: Right-sided Port-A-Cath similar position. Small right pleural effusion similar to prior radiograph. Interval decrease in the size of the opacity seen in the right mid lung field. The left lung is clear. No pneumothorax. Stable cardiomediastinal silhouette. Degenerative changes of the spine. No acute osseous pathology. IMPRESSION: Decrease in the size of opacity in the right mid lung field compared to prior radiograph. Unchanged small right pleural effusion. Electronically Signed   By: Laren Everts.D.  On: 03/27/2021 19:23     Impression: Odynophagia: suspected esophagitis. Possibly candida esophagitis, given current chemotherapy.  Patient on empiric diflucan. -Hgb 9.6  Stage IV uterine cancer (on chemotherapy), malignant pleural effusion -WBCs 1.9 K/uL -Platelets 110 K/uL -Liver metastasis: AST 70/ ALT 67  Hypokalemia (2.2)  Plan: Proceed with EGD tomorrow.  I thoroughly discussed the procedure with the patient to include nature, alternatives, benefits, and risks (including but not limited to bleeding, infection, perforation, anesthesia/cardiac and pulmonary complications). Patient verbalized understanding and gave verbal consent to proceed with EGD.  Increase Protonix to BID dosing.  Clear liquids OK if tolerated with NPO at midnight.  [facility] will follow.   LOS: 1 day   Salley Slaughter  PA-C 03/28/2021, 9:40 AM  Contact #  [phone]

## 2021-03-28 NOTE — Progress Notes (Signed)
PROGRESS NOTE    Yvette Roberts  JFH:[id number] DOB: [birth year] DOA: 03/27/2021 PCP: [name], FNP  Brief Narrative: Yvette Roberts is a 65 year old female with prior history of hypertension and type 2 diabetes mellitus, no longer on meds, stage IV uterine cancer, history of malignant pleural effusion diagnosed in 2/22, followed by Dr. Alvy Bimler currently on chemotherapy, has had multiple symptoms most notably severe odynophagia, with resultant minimal oral intake and profound weight loss. -She had been prescribed liquid medications for supportive care however unable to get them. -Reportedly lost 50 to 60 pounds in the last 3 months, her main complaint is severe odynophagia and pain with any attempt at swallowing, she also has mild lower abdominal discomfort which is somewhat chronic and unchanged.   Assessment & Plan:  Severe odynophagia Multifactorial weight loss, adult failure to thrive Severe protein calorie malnutrition -Clinically suspect Candida esophagitis, she has oral thrush -Started on IV fluconazole,, continue IV PPI, clear liquids -Add Magic mouthwash, nystatin -Gastroenterology consulted, plan for endoscopy tomorrow -Will obtain CT chest abdomen pelvis per oncology recommendation, to assess response to therapy -Dietitian consulted, prealbumin is 16  Stage IV uterine cancer History of malignant pleural effusion -CT chest abdomen pelvis today -Supportive care, pain control  Anemia, pancytopenia -Secondary to metastatic cancer and chemotherapy, continue to monitor  Severe hypokalemia Hypomagnesemia -Replace, recheck in a.m.  Type 2 diabetes mellitus without complication (H[facility]) -No longer on meds, check CBGs, hemoglobin A1c  DVT prophylaxis: Lovenox  code Status: Full code Family Communication: Discussed with patient in detail, no family at bedside Disposition Plan:  Status is: Inpatient  Remains inpatient appropriate because:Inpatient level of care  appropriate due to severity of illness   Dispo: The patient is from: Home              Anticipated d/c is to: Home              Patient currently is not medically stable to d/c.   Difficult to place patient No   Consultants:   [facility]   Procedures:   Antimicrobials:    Subjective: -Feels about the same, continues to have odynophagia, she feels like medications are starting to help  Objective: Vitals:   03/27/21 1748 03/27/21 2201 03/28/21 0153 03/28/21 0541  BP: 108/77 125/74 121/63 120/66  Pulse: 65 64 68 64  Resp: 16 17 18 18   Temp: 99 F (37.2 C) 98 F (36.7 C)  98.7 F (37.1 C)  TempSrc: Oral Oral  Oral  SpO2: 99% 97% 100% 100%    Intake/Output Summary (Last 24 hours) at 03/28/2021 1137 Last data filed at 03/28/2021 0323 Gross per 24 hour  Intake 802.5 ml  Output --  Net 802.5 ml   There were no vitals filed for this visit.  Examination:  General exam: Pleasant chronically ill female sitting up in bed, AAOx3, no distress HEENT: Whitish plaques on her tongue, thrush noted on palate, Port-A-Cath noted CVS: S1-S2, regular rate rhythm Lungs: Decreased breath sounds with bases Abdomen: Soft, nontender, nondistended, bowel sounds present Extremities: No edema Skin: No rashes on exposed skin Psychiatry:  Mood & affect appropriate.     Data Reviewed:   CBC: Recent Labs  Lab 03/22/21 1059 03/27/21 1755 03/28/21 0500  WBC 5.9 2.6* 1.9*  NEUTROABS 4.4  --   --   HGB 11.1* 9.4* 9.6*  HCT 32.5* 29.0* 29.8*  MCV 82.3 86.1 86.4  PLT 153 108* 937*   Basic Metabolic Panel: Recent Labs  Lab 03/22/21 1059 03/27/21 1755 03/28/21 0500 03/28/21 0900  NA 139 135 137  --   K 2.9* 2.2* 2.2*  --   CL 91* 92* 93*  --   CO2 32 34* 33*  --   GLUCOSE 170* 114* 105*  --   BUN 10 11 10   --   CREATININE 0.71 0.43* 0.47  --   CALCIUM 9.5 8.3* 8.5*  --   MG 1.5*  --   --  1.6*   GFR: Estimated Creatinine Clearance: 73.4 mL/min (by C-G formula  based on SCr of 0.47 mg/dL). Liver Function Tests: Recent Labs  Lab 03/22/21 1059 03/27/21 1755 03/28/21 0500  AST 33 57* 70*  ALT 34 57* 67*  ALKPHOS 72 42 46  BILITOT 0.5 1.1 1.0  PROT 7.2 6.1* 6.2*  ALBUMIN 3.0* 3.0* 3.1*   No results for input(s): LIPASE, AMYLASE in the last 168 hours. No results for input(s): AMMONIA in the last 168 hours. Coagulation Profile: No results for input(s): INR, PROTIME in the last 168 hours. Cardiac Enzymes: No results for input(s): CKTOTAL, CKMB, CKMBINDEX, TROPONINI in the last 168 hours. BNP (last 3 results) No results for input(s): PROBNP in the last 8760 hours. HbA1C: Recent Labs    03/27/21 1755  HGBA1C 6.1*   CBG: No results for input(s): GLUCAP in the last 168 hours. Lipid Profile: No results for input(s): CHOL, HDL, LDLCALC, TRIG, CHOLHDL, LDLDIRECT in the last 72 hours. Thyroid Function Tests: No results for input(s): TSH, T4TOTAL, FREET4, T3FREE, THYROIDAB in the last 72 hours. Anemia Panel: No results for input(s): VITAMINB12, FOLATE, FERRITIN, TIBC, IRON, RETICCTPCT in the last 72 hours. Urine analysis:    Component Value Date/Time   BILIRUBINUR n 03/06/2015 1138   PROTEINUR n 03/06/2015 1138   UROBILINOGEN 1.0 03/06/2015 1138   NITRITE n 03/06/2015 1138   LEUKOCYTESUR Negative 03/06/2015 1138   Sepsis Labs: @LABRCNTIP (procalcitonin:4,lacticidven:4)  )No results found for this or any previous visit (from the past 240 hour(s)).       Radiology Studies: DG Chest 2 View  Result Date: 03/27/2021 CLINICAL DATA:  65 year old female with hypertension and diabetes. Stage IV uterine cancer. EXAM: CHEST - 2 VIEW COMPARISON:  Chest radiograph dated 02/25/2021. FINDINGS: Right-sided Port-A-Cath similar position. Small right pleural effusion similar to prior radiograph. Interval decrease in the size of the opacity seen in the right mid lung field. The left lung is clear. No pneumothorax. Stable cardiomediastinal silhouette.  Degenerative changes of the spine. No acute osseous pathology. IMPRESSION: Decrease in the size of opacity in the right mid lung field compared to prior radiograph. Unchanged small right pleural effusion. Electronically Signed   By: Anner Crete M.D.   On: 03/27/2021 19:23        Scheduled Meds: . carvedilol  12.5 mg Oral BID WC  . chlorhexidine  15 mL Mouth Rinse BID  . Chlorhexidine Gluconate Cloth  6 each Topical Daily  . enoxaparin (LOVENOX) injection  40 mg Subcutaneous Q24H  . FLUoxetine  20 mg Oral Daily  . gabapentin  200 mg Oral BID  . magic mouthwash w/lidocaine  2 mL Oral TID  . mouth rinse  15 mL Mouth Rinse q12n4p  . melatonin  10 mg Oral QHS  . mirtazapine  15 mg Oral QHS  . nystatin  5 mL Oral QID  . pantoprazole (PROTONIX) IV  40 mg Intravenous Q12H  . polyethylene glycol  17 g Oral Daily  . potassium chloride  40 mEq Oral Q2H  Continuous Infusions: . sodium chloride 75 mL/hr at 03/28/21 0818  . fluconazole (DIFLUCAN) IV 200 mg (03/28/21 1112)  . magnesium sulfate bolus IVPB    . potassium chloride 10 mEq (03/28/21 1011)     LOS: 1 day    Time spent: 9min    Domenic Polite, MD [facility]   03/28/2021, 11:37 AM

## 2021-03-28 NOTE — Progress Notes (Signed)
Date and time results received: 03/28/21 5:59 AM     Test: Potassium Critical Value: 2.2  Name of Provider Notified: 6:00 AM   Orders Received? Or Actions Taken?:Waiting orders

## 2021-03-28 NOTE — H&P (View-Only) (Signed)
Referring Provider: Dr. Domenic Polite Primary Care Physician:  [name], FNP Primary Gastroenterologist:  Althia Forts  Reason for Consultation: Odynophagia  HPI: Yvette Roberts is a 65 y.o. female with past medical history of DM type 2, HTN, stage IV uterine cancer (on chemotherapy), malignant pleural effusion presenting for consultation of odynophagia.  Patient reports painful swallowing for 1 week. She also states she has intermittent dysphagia.  She is unable to swallow liquids (or solids) due to pain.  Reports weight loss of 25-30 lbs in the last 1 month.  Denies nausea, vomiting, abdominal pain, changes in stool, melena, or hematochezia.  Denies ASA, NSAID, or blood thinner use.  Patient believes mother had an esophageal stricture but no other known family history of gastrointestinal disorders or malignancy.  Patient has never had an EGD or colonoscopy.  Past Medical History:  Diagnosis Date  . Allergy   . Arthritis   . Cancer ([place])   . COPD (chronic obstructive pulmonary disease) ([place])   . Depression   . Esophageal reflux 07/21/2014  . Frozen shoulder 05/10/2015   Injected 05/10/2015   . Obesity, unspecified 07/21/2014  . Resistant hypertension 07/21/2014  . Type 2 diabetes mellitus without complication ([place]) 02/18/5360    Past Surgical History:  Procedure Laterality Date  . IR IMAGING GUIDED PORT INSERTION  01/16/2021  . LAPAROSCOPIC ABDOMINAL EXPLORATION    . miscarr      Prior to Admission medications   Medication Sig Start Date End Date Taking? Authorizing Provider  amLODipine (NORVASC) 5 MG tablet Take 5 mg by mouth daily. 03/16/21  Yes [provider]  carvedilol (COREG) 12.5 MG tablet Take 12.5 mg by mouth 2 (two) times daily with a meal.   Yes [provider]  Cholecalciferol (VITAMIN D) 125 MCG (5000 UT) CAPS Take 5,000 Units by mouth daily.   Yes [provider]  dexamethasone (DECADRON) 4 MG tablet TAKE 2 TABLETS BY MOUTH NIGHT  BEFORE AND 2 TABLETS IN THE MORNING OF CHEMO EVERY 3 WEEKS FOR 6 CYCLES 03/22/21 03/22/22 Yes Gorsuch, Ni, MD  famotidine (PEPCID) 20 MG tablet Take 20 mg by mouth 2 (two) times daily. 03/19/21  Yes [provider]  HYDROmorphone (DILAUDID) 4 MG tablet TAKE 1 TABLET BY MOUTH EVERY 4 HOURS AS NEEDED FOR SEVERE PAIN. Patient taking differently: Take 4 mg by mouth every 4 (four) hours as needed for severe pain. 03/13/21 09/09/21 Yes Gorsuch, Ni, MD  lidocaine (LIDODERM) 5 % Place 1 patch onto the skin daily. Remove & Discard patch within 12 hours or as directed by MD 03/01/21  Yes Alvy Bimler, Ni, MD  omeprazole (PRILOSEC) 40 MG capsule Take 40 mg by mouth daily. 03/13/21  Yes [provider]  ondansetron (ZOFRAN) 8 MG tablet TAKE 1 TABLET BY MOUTH EVERY 8 HOURS AS NEEDED START ON THE THIRD DAY AFTER CHEMO Patient taking differently: Take 8 mg by mouth every 8 (eight) hours as needed for nausea or vomiting. 01/08/21 01/08/22 Yes Gorsuch, Ni, MD  prochlorperazine (COMPAZINE) 10 MG tablet TAKE 1 TABLET BY MOUTH EVERY 6 HOURS AS NEEDED FOR NAUSEA OR VOMITING Patient taking differently: Take 10 mg by mouth every 6 (six) hours as needed for nausea or vomiting. 01/08/21 01/08/22 Yes Gorsuch, Ni, MD  famotidine (PEPCID) 40 MG/5ML suspension Take 2.5 mLs (20 mg total) by mouth 2 (two) times daily as needed (Heartburn). Patient not taking: Reported on 03/27/2021 03/12/21     FLUoxetine (PROZAC) 20 MG/5ML solution Take 10 mLs (40 mg  total) by mouth daily. Patient not taking: Reported on 03/27/2021 03/12/21     lidocaine-prilocaine (EMLA) cream APPLY TO AFFECTED AREA ONCE AS DIRECTED Patient not taking: Reported on 03/27/2021 01/08/21 01/08/22  [name], MD  mirtazapine (REMERON SOL-TAB) 15 MG disintegrating tablet Dissolve  1 tablet (15 mg total) in mouth at bedtime. Patient not taking: Reported on 03/27/2021 03/22/21   [name], MD    Scheduled Meds: . carvedilol  12.5 mg Oral BID WC  . chlorhexidine  15  mL Mouth Rinse BID  . Chlorhexidine Gluconate Cloth  6 each Topical Daily  . enoxaparin (LOVENOX) injection  40 mg Subcutaneous Q24H  . FLUoxetine  20 mg Oral Daily  . iohexol  500 mL Oral Q1H  . magic mouthwash w/lidocaine  2 mL Oral TID  . mouth rinse  15 mL Mouth Rinse q12n4p  . melatonin  10 mg Oral QHS  . mirtazapine  15 mg Oral QHS  . nystatin  5 mL Oral QID  . pantoprazole (PROTONIX) IV  40 mg Intravenous Daily  . polyethylene glycol  17 g Oral Daily  . potassium chloride  40 mEq Oral Q2H   Continuous Infusions: . sodium chloride 75 mL/hr at 03/28/21 0818  . fluconazole (DIFLUCAN) IV 200 mg (03/27/21 1800)  . potassium chloride 10 mEq (03/28/21 0819)   PRN Meds:.HYDROmorphone (DILAUDID) injection, ondansetron **OR** ondansetron (ZOFRAN) IV, phenol  Allergies as of 03/27/2021 - Review Complete 03/27/2021  Allergen Reaction Noted  . Eggs or egg-derived products Hives 10/15/2016  . Influenza vaccines Hives 10/15/2016  . Peanut-containing drug products Hives 10/15/2016  . Penicillins Hives 07/21/2014  . Sulfa antibiotics Hives 07/21/2014  . Codeine Rash 04/02/2012    Family History  Problem Relation Age of Onset  . Breast cancer Sister        ? age of onset  . Breast cancer Maternal Aunt   . Breast cancer Maternal Aunt   . Breast cancer Maternal Aunt     Social History   Socioeconomic History  . Marital status: Divorced    Spouse name: Not on file  . Number of children: Not on file  . Years of education: Not on file  . Highest education level: Not on file  Occupational History  . Not on file  Tobacco Use  . Smoking status: Never Smoker  . Smokeless tobacco: Never Used  Substance and Sexual Activity  . Alcohol use: Not on file  . Drug use: Not on file  . Sexual activity: Not on file  Other Topics Concern  . 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  HENT: Positive for sore throat. Negative for hearing loss.   Eyes: Negative for pain and redness.  Respiratory: Negative for cough and shortness of breath.   Cardiovascular: Negative for chest pain and palpitations.  Gastrointestinal: Negative for abdominal pain, blood in stool, constipation, diarrhea, heartburn, melena, nausea and vomiting.  Genitourinary: Negative for flank pain and hematuria.  Musculoskeletal: Negative for falls and joint pain.  Skin: Negative for itching and rash.  Neurological: Negative for seizures and loss of consciousness.  Endo/Heme/Allergies: Negative for polydipsia. Does not bruise/bleed easily.  Psychiatric/Behavioral: Negative for substance abuse. The patient is not nervous/anxious.     Physical Exam: Vital signs: Vitals:   03/28/21 0153 03/28/21 0541  BP: 121/63 120/66  Pulse: 68 64  Resp: 18 18  Temp:  98.7 F (37.1 C)  SpO2: 100% 100%   Last BM Date: 03/26/21  Physical Exam Vitals reviewed.  Constitutional:      General: She is not in acute distress. HENT:     Head: Normocephalic and atraumatic.     Nose: Nose normal. No congestion.     Mouth/Throat:     Mouth: Mucous membranes are moist.     Pharynx: Oropharynx is clear.  Eyes:     Extraocular Movements: Extraocular movements intact.     Conjunctiva/sclera: Conjunctivae normal.  Cardiovascular:     Rate and Rhythm: Normal rate and regular rhythm.     Heart sounds: Normal heart sounds.  Pulmonary:     Effort: Pulmonary effort is normal. No  respiratory distress.  Abdominal:     General: Bowel sounds are normal. There is no distension.     Palpations: Abdomen is soft.     Tenderness: There is no abdominal tenderness. There is no guarding or rebound.     Hernia: No hernia is present.  Musculoskeletal:        General: No swelling or tenderness.     Cervical back: Normal range of motion and neck supple.  Skin:    General: Skin is warm and dry.  Neurological:     General: No focal deficit present.     Mental Status: She is alert and oriented to person, place, and time.  Psychiatric:        Mood and Affect: Mood normal.        Behavior: Behavior normal. Behavior is cooperative.   '  GI:  Lab Results: Recent Labs    03/27/21 1755 03/28/21 0500  WBC 2.6* 1.9*  HGB 9.4* 9.6*  HCT 29.0* 29.8*  PLT 108* 110*   BMET Recent Labs    03/27/21 1755 03/28/21 0500  NA 135 137  K 2.2* 2.2*  CL 92* 93*  CO2 34* 33*  GLUCOSE 114* 105*  BUN 11 10  CREATININE 0.43* 0.47  CALCIUM 8.3* 8.5*   LFT Recent Labs    03/28/21 0500  PROT 6.2*  ALBUMIN 3.1*  AST 70*  ALT 67*  ALKPHOS 46  BILITOT 1.0   PT/INR No results for input(s): LABPROT, INR in the last 72 hours.   Studies/Results: DG Chest 2 View  Result Date: 03/27/2021 CLINICAL DATA:  65 year old female with hypertension and diabetes. Stage IV uterine cancer. EXAM: CHEST - 2 VIEW COMPARISON:  Chest radiograph dated 02/25/2021. FINDINGS: Right-sided Port-A-Cath similar position. Small right pleural effusion similar to prior radiograph. Interval decrease in the size of the opacity seen in the right mid lung field. The left lung is clear. No pneumothorax. Stable cardiomediastinal silhouette. Degenerative changes of the spine. No acute osseous pathology. IMPRESSION: Decrease in the size of opacity in the right mid lung field compared to prior radiograph. Unchanged small right pleural effusion. Electronically Signed   By: Laren Everts.D.  On: 03/27/2021 19:23     Impression: Odynophagia: suspected esophagitis. Possibly candida esophagitis, given current chemotherapy.  Patient on empiric diflucan. -Hgb 9.6  Stage IV uterine cancer (on chemotherapy), malignant pleural effusion -WBCs 1.9 K/uL -Platelets 110 K/uL -Liver metastasis: AST 70/ ALT 67  Hypokalemia (2.2)  Plan: Proceed with EGD tomorrow.  I thoroughly discussed the procedure with the patient to include nature, alternatives, benefits, and risks (including but not limited to bleeding, infection, perforation, anesthesia/cardiac and pulmonary complications). Patient verbalized understanding and gave verbal consent to proceed with EGD.  Increase Protonix to BID dosing.  Clear liquids OK if tolerated with NPO at midnight.  [facility] will follow.   LOS: 1 day   Salley Slaughter  PA-C 03/28/2021, 9:40 AM  Contact #  [phone]

## 2021-03-28 NOTE — Progress Notes (Signed)
Provider notified on pre-albumin level, Dr. Hal Hope.

## 2021-03-29 ENCOUNTER — Encounter (HOSPITAL_COMMUNITY)
Admission: AD | Disposition: A | Discharge status: Skilled Nursing Facility | Payer: Self-pay | Source: Ambulatory Visit | Attending: Family Medicine

## 2021-03-29 ENCOUNTER — Inpatient Hospital Stay (HOSPITAL_COMMUNITY): Payer: Medicare Other | Admitting: Certified Registered"

## 2021-03-29 ENCOUNTER — Encounter (HOSPITAL_COMMUNITY): Payer: Self-pay | Admitting: Internal Medicine

## 2021-03-29 DIAGNOSIS — K209 Esophagitis, unspecified without bleeding: Secondary | ICD-10-CM | POA: Diagnosis not present

## 2021-03-29 HISTORY — PX: ESOPHAGOGASTRODUODENOSCOPY: SHX5428

## 2021-03-29 HISTORY — PX: BIOPSY: SHX5522

## 2021-03-29 LAB — CBC
HCT: 27.3 % — ABNORMAL LOW (ref 36.0–46.0)
Hemoglobin: 8.8 g/dL — ABNORMAL LOW (ref 12.0–15.0)
MCH: 27.8 pg (ref 26.0–34.0)
MCHC: 32.2 g/dL (ref 30.0–36.0)
MCV: 86.1 fL (ref 80.0–100.0)
Platelets: 93 10*3/uL — ABNORMAL LOW (ref 150–400)
RBC: 3.17 MIL/uL — ABNORMAL LOW (ref 3.87–5.11)
RDW: 16 % — ABNORMAL HIGH (ref 11.5–15.5)
WBC: 1.5 10*3/uL — ABNORMAL LOW (ref 4.0–10.5)
nRBC: 0 % (ref 0.0–0.2)

## 2021-03-29 LAB — BASIC METABOLIC PANEL
Anion gap: 10 (ref 5–15)
BUN: 7 mg/dL — ABNORMAL LOW (ref 8–23)
CO2: 31 mmol/L (ref 22–32)
Calcium: 8.3 mg/dL — ABNORMAL LOW (ref 8.9–10.3)
Chloride: 94 mmol/L — ABNORMAL LOW (ref 98–111)
Creatinine, Ser: 0.42 mg/dL — ABNORMAL LOW (ref 0.44–1.00)
GFR, Estimated: >60 mL/min (ref >60)
Glucose, Bld: 96 mg/dL (ref 70–99)
Potassium: 2.7 mmol/L — CL (ref 3.5–5.1)
Sodium: 135 mmol/L (ref 135–145)

## 2021-03-29 LAB — MAGNESIUM
Magnesium: 1.6 mg/dL — ABNORMAL LOW (ref 1.7–2.4)
Magnesium: 1.8 mg/dL (ref 1.7–2.4)

## 2021-03-29 LAB — POTASSIUM: Potassium: 2.7 mmol/L — CL (ref 3.5–5.1)

## 2021-03-29 LAB — PHOSPHORUS: Phosphorus: 2.1 mg/dL — ABNORMAL LOW (ref 2.5–4.6)

## 2021-03-29 SURGERY — EGD (ESOPHAGOGASTRODUODENOSCOPY)
Anesthesia: Monitor Anesthesia Care

## 2021-03-29 MED ORDER — SUCRALFATE 1 G PO TABS
1.0000 g | ORAL_TABLET | Freq: Three times a day (TID) | ORAL | Status: DC
  Administered 2021-03-29 – 2021-04-28 (×108): 1 g via ORAL
  Filled 2021-03-29 (×110): qty 1

## 2021-03-29 MED ORDER — LIDOCAINE 2% (20 MG/ML) 5 ML SYRINGE
INTRAMUSCULAR | Status: DC | PRN
  Administered 2021-03-29: 60 mg via INTRAVENOUS

## 2021-03-29 MED ORDER — POTASSIUM CHLORIDE 20 MEQ PO PACK
40.0000 meq | PACK | Freq: Once | ORAL | Status: AC
  Administered 2021-03-29: 40 meq via ORAL
  Filled 2021-03-29: qty 2

## 2021-03-29 MED ORDER — MAGNESIUM SULFATE 4 GM/100ML IV SOLN
4.0000 g | Freq: Once | INTRAVENOUS | Status: AC
  Administered 2021-03-29: 4 g via INTRAVENOUS
  Filled 2021-03-29: qty 100

## 2021-03-29 MED ORDER — POTASSIUM CHLORIDE 10 MEQ/50ML IV SOLN
10.0000 meq | INTRAVENOUS | Status: AC
  Administered 2021-03-29 – 2021-03-30 (×6): 10 meq via INTRAVENOUS
  Filled 2021-03-29 (×6): qty 50

## 2021-03-29 MED ORDER — POTASSIUM CHLORIDE 10 MEQ/100ML IV SOLN
10.0000 meq | INTRAVENOUS | Status: AC
  Administered 2021-03-29 (×4): 10 meq via INTRAVENOUS
  Filled 2021-03-29: qty 100

## 2021-03-29 MED ORDER — POTASSIUM PHOSPHATES 15 MMOLE/5ML IV SOLN
10.0000 mmol | Freq: Once | INTRAVENOUS | Status: AC
  Administered 2021-03-29: 10 mmol via INTRAVENOUS
  Filled 2021-03-29: qty 3.33

## 2021-03-29 MED ORDER — MAGNESIUM SULFATE 2 GM/50ML IV SOLN: 2.0000 g | Freq: Once | INTRAVENOUS | Status: DC

## 2021-03-29 MED ORDER — PROPOFOL 500 MG/50ML IV EMUL
INTRAVENOUS | Status: DC | PRN
  Administered 2021-03-29: 100 ug/kg/min via INTRAVENOUS

## 2021-03-29 MED ORDER — LACTATED RINGERS IV SOLN: Freq: Once | INTRAVENOUS | Status: AC

## 2021-03-29 MED ORDER — PROPOFOL 10 MG/ML IV BOLUS
INTRAVENOUS | Status: DC | PRN
  Administered 2021-03-29: 20 mg via INTRAVENOUS
  Administered 2021-03-29: 70 mg via INTRAVENOUS
  Administered 2021-03-29: 20 mg via INTRAVENOUS

## 2021-03-29 MED ORDER — LACTATED RINGERS IV SOLN: INTRAVENOUS | Status: DC | PRN

## 2021-03-29 NOTE — Plan of Care (Signed)
  Problem: Education: Goal: Knowledge of General Education information will improve Description: Including pain rating scale, medication(s)/side effects and non-pharmacologic comfort measures Outcome: Progressing   Problem: Pain Managment: Goal: General experience of comfort will improve Outcome: Progressing   Problem: Safety: Goal: Ability to remain free from injury will improve Outcome: Progressing   

## 2021-03-29 NOTE — Transfer of Care (Signed)
Immediate Anesthesia Transfer of Care Note  Patient: Yvette Roberts  Procedure(s) Performed: ESOPHAGOGASTRODUODENOSCOPY (EGD) (N/A ) BIOPSY  Patient Location: PACU  Anesthesia Type:MAC  Level of Consciousness: drowsy  Airway & Oxygen Therapy: Patient Spontanous Breathing and Patient connected to nasal cannula oxygen  Post-op Assessment: Report given to RN and Post -op Vital signs reviewed and stable  Post vital signs: Reviewed and stable  Last Vitals:  Vitals Value Taken Time  BP 130/73 03/29/21 0830  Temp    Pulse 62 03/29/21 0830  Resp 21 03/29/21 0830  SpO2 97 % 03/29/21 0830  Vitals shown include unvalidated device data.  Last Pain:  Vitals:   03/29/21 0748  TempSrc: Oral  PainSc: 9          Complications: No complications documented.

## 2021-03-29 NOTE — Progress Notes (Signed)
Patient is for Potassium lab draw at this time, potassium phosphate currently infusing. V/S stable, on call provider notified of this, informed nurse that lab may be drawn when infusion is complete.

## 2021-03-29 NOTE — Interval H&P Note (Signed)
History and Physical Interval Note:  03/29/2021 8:07 AM  Yvette Roberts  has presented today for surgery, with the diagnosis of odynophagia.  The various methods of treatment have been discussed with the patient and family. After consideration of risks, benefits and other options for treatment, the patient has consented to  Procedure(s): ESOPHAGOGASTRODUODENOSCOPY (EGD) (N/A) as a surgical intervention.  The patient's history has been reviewed, patient examined, no change in status, stable for surgery.  I have reviewed the patient's chart and labs.  Questions were answered to the patient's satisfaction.     Andriy Sherk

## 2021-03-29 NOTE — Op Note (Signed)
[facility] Patient Name: Yvette Roberts Procedure Date: 03/29/2021 MRN: [id number] Attending MD: Otis Brace , MD Date of Birth: [birth year] CSN: [id number] Age: 65 Admit Type: Inpatient Procedure:                Upper GI endoscopy Indications:              Odynophagia Providers:                Otis Brace, MD, Tyna Jaksch Technician Referring MD:              Medicines:                Sedation Administered by an Anesthesia Professional Complications:            No immediate complications. Estimated Blood Loss:     Estimated blood loss was minimal. Procedure:                Pre-Anesthesia Assessment:                           - Prior to the procedure, a History and Physical                            was performed, and patient medications and                            allergies were reviewed. The patient's tolerance of                            previous anesthesia was also reviewed. The risks                            and benefits of the procedure and the sedation                            options and risks were discussed with the patient.                            All questions were answered, and informed consent                            was obtained. Prior Anticoagulants: The patient has                            taken no previous anticoagulant or antiplatelet                            agents. ASA Grade Assessment: III - A patient with                            severe systemic disease. After reviewing the risks                            and benefits, the patient was deemed in  satisfactory condition to undergo the procedure.                           After obtaining informed consent, the endoscope was                            passed under direct vision. Throughout the                            procedure, the patient's blood pressure, pulse, and                            oxygen saturations were monitored continuously.  The                            GIF-H190 ([id number]) Olympus gastroscope was                            introduced through the mouth, and advanced to the                            second part of duodenum. The upper GI endoscopy was                            accomplished without difficulty. The patient                            tolerated the procedure well. Scope In: Scope Out: Findings:      Non-severe esophagitis was found in the proximal and distal esophagus.       Biopsies were taken with a cold forceps for histology.      Scattered mild inflammation characterized by erythema was found in the       entire examined stomach. Biopsies were taken with a cold forceps for       histology.      The cardia and gastric fundus were normal on retroflexion.      The duodenal bulb, first portion of the duodenum and second portion of       the duodenum were normal. Impression:               - Non-severe esophagitis. Biopsied.                           - Gastritis. Biopsied.                           - Normal duodenal bulb, first portion of the                            duodenum and second portion of the duodenum. Moderate Sedation:      Moderate (conscious) sedation was personally administered by an       anesthesia professional. The following parameters were monitored: oxygen       saturation, heart rate, blood pressure, and response to care. Recommendation:           - Return patient to hospital ward for ongoing  care.                           - Full liquid diet.                           - Continue present medications.                           - Await pathology results. Procedure Code(s):        --- Professional ---                           [phone], Esophagogastroduodenoscopy, flexible,                            transoral; with biopsy, single or multiple Diagnosis Code(s):        --- Professional ---                           K20.90, Esophagitis, unspecified without bleeding                            K29.70, Gastritis, unspecified, without bleeding                           R13.10, Dysphagia, unspecified CPT copyright 2019 [employer]. All rights reserved. The codes documented in this report are preliminary and upon coder review may  be revised to meet current compliance requirements. Otis Brace, MD Otis Brace, MD 03/29/2021 8:41:14 AM Number of Addenda: 0

## 2021-03-29 NOTE — Progress Notes (Addendum)
PROGRESS NOTE    BIJAL SIGLIN  MKL:[id number]  DOB: 1955-12-06  DOA: 03/27/2021 PCP: [name], FNP Outpatient Specialists:   Hospital course:  Haiven Nardone Smithis a65 year old female with prior history of hypertension and type 2 diabetes mellitus, no longer on meds, stage IV uterine cancer, history of malignant pleural effusion diagnosed in 2/22, followed by Dr. Alvy Bimler currently on chemotherapy, was admitted 03/27/21 with severe odynophagia, with resultant minimal oral intake and profound weight loss, reportedly 50 lbs over 3 months.    Subjective:  Patient states that she tolerated her EGD earlier today okay.  She still has some throat plane.  She still has significant discomfort when she has eating or drinking liquids.  She notes her pain is well controlled.   Objective: Vitals:   03/29/21 0830 03/29/21 0840 03/29/21 0850 03/29/21 1441  BP: 130/73 137/74 (!) 143/82 118/72  Pulse: 64 65 65 67  Resp: (!) 22 19 (!) 22   Temp: 98.9 F (37.2 C)   98.7 F (37.1 C)  TempSrc: Oral   Oral  SpO2: 98% 92% 94% 98%    Intake/Output Summary (Last 24 hours) at 03/29/2021 1624 Last data filed at 03/29/2021 1441 Gross per 24 hour  Intake 667.4 ml  Output --  Net 667.4 ml   There were no vitals filed for this visit.   Exam:  General: Frail elderly patient lying in bed looking tired and somewhat pale. Eyes: sclera anicteric, conjuctiva mild injection bilaterally CVS: S1-S2, regular  Respiratory:  decreased air entry bilaterally secondary to decreased inspiratory effort, rales at bases  GI: NABS, soft, mild tenderness to deep palpation epigastric region without rebound. LE: No edema.  Neuro: A/O x 3, Moving all extremities equally with normal strength, CN 3-12 intact, grossly nonfocal.  Psych: patient is logical and coherent, judgement and insight appear normal, mood and affect appropriate to situation.   Assessment & Plan:   65 year old female with stage IV uterine  CA presents with odynophagia, decreased p.o. intake and significant weight loss with severe protein calorie malnutrition.  Odynophagia Patient underwent EGD earlier today which showed nonsevere esophagitis in the proximal distal esophagus as well as scattered mild inflammation with erythema in the entire stomach.  Biopsies were taken and are pending. Discontinue IV fluconazole which was started presumptively for possible candidal esophagitis. Continue IV pantoprazole and sucralfate Continue Magic mouthwash with lidocaine  Hypokalemia Patient has profound hypokalemia despite aggressive resuscitation Patient received 4 runs of KCL, will repeat potassium tonight and continue aggressive repletion Pharmacy consultation placed for assistance with potassium repletion.  Severe protein calorie malnutrition Dietitian has been consulted however patient is really unable to take p.o.'s right now Per Dr. Alvy Bimler, patient will need to stay in house until she is able to keep her self nutritionally replete either by p.o. or some other modality.  Stage IV uterine CA CT chest abdomen pelvis was done per oncology recommendations to assess response to treatment Patient is being closely followed by Dr. Rozetta Nunnery. Pain is well controlled on Dilaudid  Pancytopenia Secondary to chemotherapy, being managed by hematology oncology  DM2 Resolved with weight loss  Anxiety and depression Continue home medications of Remeron and Prozac  DVT prophylaxis: Lovenox Code Status: Full Family Communication: None Disposition Plan:   Patient is from: Home  Anticipated Discharge Location: Home  Barriers to Discharge: Unable to keep her self adequately hydrated or nutritionally replete orally  Is patient medically stable for Discharge: No   Consultants:  Medical oncology  Gastroenterology  Procedures:  EGD 03/29/2021  Antimicrobials:  None   Data Reviewed:  Basic Metabolic Panel: Recent Labs  Lab  03/27/21 1755 03/28/21 0500 03/28/21 0900 03/29/21 0502  NA 135 137  --  135  K 2.2* 2.2*  --  2.7*  CL 92* 93*  --  94*  CO2 34* 33*  --  31  GLUCOSE 114* 105*  --  96  BUN 11 10  --  7*  CREATININE 0.43* 0.47  --  0.42*  CALCIUM 8.3* 8.5*  --  8.3*  MG  --   --  1.6* 1.6*  PHOS  --   --   --  2.1*   Liver Function Tests: Recent Labs  Lab 03/27/21 1755 03/28/21 0500  AST 57* 70*  ALT 57* 67*  ALKPHOS 42 46  BILITOT 1.1 1.0  PROT 6.1* 6.2*  ALBUMIN 3.0* 3.1*   No results for input(s): LIPASE, AMYLASE in the last 168 hours. No results for input(s): AMMONIA in the last 168 hours. CBC: Recent Labs  Lab 03/27/21 1755 03/28/21 0500 03/29/21 0502  WBC 2.6* 1.9* 1.5*  HGB 9.4* 9.6* 8.8*  HCT 29.0* 29.8* 27.3*  MCV 86.1 86.4 86.1  PLT 108* 110* 93*   Cardiac Enzymes: No results for input(s): CKTOTAL, CKMB, CKMBINDEX, TROPONINI in the last 168 hours. BNP (last 3 results) No results for input(s): PROBNP in the last 8760 hours. CBG: No results for input(s): GLUCAP in the last 168 hours.  Recent Results (from the past 240 hour(s))  SARS CORONAVIRUS 2 (TAT 6-24 HRS) Nasopharyngeal Nasopharyngeal Swab     Status: None   Collection Time: 03/28/21  5:20 AM   Specimen: Nasopharyngeal Swab  Result Value Ref Range Status   SARS Coronavirus 2 NEGATIVE NEGATIVE Final    Comment: (NOTE) SARS-CoV-2 target nucleic acids are NOT DETECTED.  The SARS-CoV-2 RNA is generally detectable in upper and lower respiratory specimens during the acute phase of infection. Negative results do not preclude SARS-CoV-2 infection, do not rule out co-infections with other pathogens, and should not be used as the sole basis for treatment or other patient management decisions. Negative results must be combined with clinical observations, patient history, and epidemiological information. The expected result is Negative.  Fact Sheet for Patients: SugarRoll.be  Fact  Sheet for Healthcare Providers: https://www.woods-mathews.com/  This test is not yet approved or cleared by the [place] FDA and  has been authorized for detection and/or diagnosis of SARS-CoV-2 by FDA under an Emergency Use Authorization (EUA). This EUA will remain  in effect (meaning this test can be used) for the duration of the COVID-19 declaration under Se ction 564(b)(1) of the Act, 21 U.S.C. section 360bbb-3(b)(1), unless the authorization is terminated or revoked sooner.  Performed at [facility], [place] [address], [place], [place] [address]       Studies: DG Chest 2 View  Result Date: 03/27/2021 CLINICAL DATA:  65 year old female with hypertension and diabetes. Stage IV uterine cancer. EXAM: CHEST - 2 VIEW COMPARISON:  Chest radiograph dated 02/25/2021. FINDINGS: Right-sided Port-A-Cath similar position. Small right pleural effusion similar to prior radiograph. Interval decrease in the size of the opacity seen in the right mid lung field. The left lung is clear. No pneumothorax. Stable cardiomediastinal silhouette. Degenerative changes of the spine. No acute osseous pathology. IMPRESSION: Decrease in the size of opacity in the right mid lung field compared to prior radiograph. Unchanged small right pleural effusion. Electronically Signed   By: Milas Hock  Radparvar M.D.   On: 03/27/2021 19:23   CT CHEST ABDOMEN PELVIS W CONTRAST  Result Date: 03/28/2021 CLINICAL DATA:  Ovarian cancer assess treatment response. EXAM: CT CHEST, ABDOMEN, AND PELVIS WITH CONTRAST TECHNIQUE: Multidetector CT imaging of the chest, abdomen and pelvis was performed following the standard protocol during bolus administration of intravenous contrast. CONTRAST:  18mL OMNIPAQUE IOHEXOL 300 MG/ML  SOLN COMPARISON:  PET-CT from December 08, 2020 and CT of the chest of Feb 25, 2021. FINDINGS: CT CHEST FINDINGS Cardiovascular: RIGHT-sided Port-A-Cath terminates in the lower RIGHT atrium similar to  recent CT imaging, perhaps influenced by arm position as on the chest x-ray the tip is at the caval to atrial junction. Heart size is normal without substantial pericardial effusion. Normal caliber of the thoracic aorta. Normal caliber of the central pulmonary vasculature. Mediastinum/Nodes: No thoracic inlet adenopathy. No axillary adenopathy. No mediastinal lymphadenopathy. Esophagus mildly patulous. Lungs/Pleura: Loculated RIGHT-sided pleural fluid with 6.5 x 3.6 cm axial dimension as compared to 8.1 x 3.9 cm. Basilar volume loss and signs of pleural thickening with decreased pleural thickening, markedly diminished since the prior PET and with continued decreased pleural thickening particularly in the inferior RIGHT chest even since the recent CT of the chest. Over the RIGHT hemidiaphragm pleural thickness approximately 7 mm as compared to 12 mm greatest thickness. LEFT chest is clear. Airways are patent. Musculoskeletal: See below for full musculoskeletal details. CT ABDOMEN PELVIS FINDINGS Hepatobiliary: Low-density lesions along the cephalad margin of the RIGHT hemi liver and across the top of the RIGHT hemidiaphragm and diminished in size considerably compared to the PET exam of February of 2022. Pleural thickening across the surface of the liver also with decreased thickness (image 47/2) 7 mm greatest thickness as compared to 14 mm greatest thickness. Area of low attenuation along the capsule of the liver (image 45/2) overlying hepatic subsegment Roman numeral 8 measuring 18 mm as compared to 20 mm on the most recent comparison and is much as 43 mm on the prior CT evaluation. Capsular implant with extension into hepatic parenchyma much less conspicuous today than on the previous PET-CT from February and the CT from Feb 25, 2021. This area measuring approximately 1.8 as compared to 2.4 cm based on comparison with the most recent study. Much smaller than on the study of February of 2022 where it measured  approximately 41 mm. Cystic area along the gallbladder fossa and in the inferior aspect of the RIGHT hemi liver with stable appearance largest area measuring 3 cm. Portal vein is patent. Hepatic veins are patent. Cholelithiasis without pericholecystic stranding. No sign of biliary duct distension. Pancreas: Normal, without mass, inflammation or ductal dilatation. Spleen: Spleen normal size and contour. Adrenals/Urinary Tract: Adrenal glands are normal. Symmetric renal enhancement. No hydronephrosis. Smooth contour the urinary bladder. Stomach/Bowel: No acute gastrointestinal process soft tissue adjacent to the appendix in the RIGHT lower quadrant without signs of adjacent stranding is an unchanged caliber of the appendix dating back to February 2021 in the setting of known peritoneal disease with diminished fluid in the pelvis and omental and serosal nodularity throughout the abdomen. Vascular/Lymphatic: Normal caliber abdominal aorta and IVC. There is no gastrohepatic or hepatoduodenal ligament lymphadenopathy. No retroperitoneal or mesenteric lymphadenopathy. Reproductive: Decreased bulky appearance of the uterus. RIGHT adnexal cystic and solid area measuring 4.5 x 2.5 cm as compared to 5.9 x 3.1 cm. Decreased ascites in the pelvis. Other: Decreased ascites as above. Decreased signs of omental nodularity and serosal nodularity. Liver capsular disease as  described above. Discrete nodules in the LEFT omentum persist, largest approximately 6 mm (image 88/2) previously 6-7 mm. Focal thickening of the LEFT rectus muscle is diminished no measurable lesion in this area, thickness of the LEFT rectus muscle on image 103 of series 2 is 13 mm as compared to 24 mm. The adjacent omental and peritoneal nodularity and stranding is subjectively diminished since the prior study. Musculoskeletal: Spinal degenerative changes. New sclerotic focus in the RIGHT proximal femur in the femoral neck and head junction measuring 14 mm. New  sclerotic focus at the T12 level (image 60/2) 13 mm. Sclerosis at the T12 level is present however on the study of May of 2022. Not seen on the PET exam of February 2022. Spinal degenerative changes. IMPRESSION: 1. Decreased loculated pleural fluid and pleural thickening in the chest follows diminishing perihepatic soft tissue and capsular hepatic implants as described. 2. No adenopathy by size criteria in the abdomen or pelvis. Minimal soft tissue in the intra-aortocaval groove is all that remains on image 77 of series 2 in the retroperitoneum. 3. Decreased bulk of the uterus subjectively and of RIGHT adnexal structures as described. 4. New areas of bony sclerosis compatible with skeletal metastasis. No signs of increased FDG uptake and no visible sclerosis on the previous PET. In the context of improvement of other disease it is possible that this represents treated metastasis. Would suggest attention on follow-up. 5. Loculated RIGHT-sided pleural fluid with signs of pleural thickening in the lower RIGHT chest otherwise with similar appearance. 6. Cholelithiasis without evidence of acute cholecystitis. 7. Aortic atherosclerosis. Electronically Signed   By: Zetta Bills M.D.   On: 03/28/2021 15:52     Scheduled Meds: . carvedilol  12.5 mg Oral BID WC  . chlorhexidine  15 mL Mouth Rinse BID  . Chlorhexidine Gluconate Cloth  6 each Topical Daily  . enoxaparin (LOVENOX) injection  40 mg Subcutaneous Q24H  . FLUoxetine  20 mg Oral Daily  . gabapentin  200 mg Oral BID  . magic mouthwash w/lidocaine  2 mL Oral TID  . mouth rinse  15 mL Mouth Rinse q12n4p  . melatonin  10 mg Oral QHS  . mirtazapine  15 mg Oral QHS  . nystatin  5 mL Oral QID  . pantoprazole (PROTONIX) IV  40 mg Intravenous Q12H  . polyethylene glycol  17 g Oral Daily  . sucralfate  1 g Oral TID WC & HS   Continuous Infusions: . sodium chloride 75 mL/hr at 03/29/21 0342  . fluconazole (DIFLUCAN) IV 200 mg (03/29/21 0959)  .  potassium chloride 10 mEq (03/29/21 1544)  . potassium PHOSPHATE IVPB (in mmol)      Principal Problem:   Esophagitis Active Problems:   Type 2 diabetes mellitus without complication (HCC)   Hypertension   Uterine cancer ([place])   Malignant pleural effusion   Protein-calorie malnutrition, severe ([place])   Odynophagia     Anny Sayler [place], [facility]  If 7PM-7AM, please contact night-coverage [email or web address]   LOS: 2 days

## 2021-03-29 NOTE — Progress Notes (Signed)
Yvette Roberts   DOB:03-May-1956   IN#:[id number]    ASSESSMENT & PLAN:  Metastatic Uterine cancer ([place]) I have personally reviewed CT imaging She has good response to treatment Continue supportive care  Esophagitis She has profound symptoms of esophagitis Appreciated GI work-up I have reviewed EGD report She will continue antiacid medications as prescribed by GI service  Dysphagia This could be related to esophagitis  She will attempt to swallow more now that her pain is better controlled  Cancer associated pain She has severe esophageal pain and right upper quadrant pain and not able to take pain medicine She will continue pain medicine as prescribed  Abnormal weight loss She has severe, profound weight loss, initially due to undiagnosed cancer and now inability to swallow with cachexia On February 23, she weighed approximately 223 pounds As of last week, she weighed only 169 pounds She would benefit from dietitian consultation while hospitalized and may need parenteral nutritional support but I am hoping to avoid it due to her pancytopenia I encouraged the patient to start liquid or soft diet as tolerated  Acquired pancytopenia She does not need G-CSF support unless she has fever She does not need blood transfusion unless hemoglobin is less than 8 or platelet count less than 10 Monitor closely  Severe electrolyte imbalance Due to poor oral intake Continue replacement therapy  Goals of care, counseling/discussion I have reviewed goals of care discussion in the purpose of admission with the patient and her niece and they are both in agreement to be admitted  Discharge planning She is now ready to be discharged until she can tolerate oral diet and able to swallow her pills  All questions were answered. The patient knows to call the clinic with any problems, questions or concerns.   [name], MD 03/29/2021 10:52 AM  Subjective:  She is seen after her EGD today I  have reviewed her CT imaging, blood work as well as report from EGD She felt slightly better She continues to have significant throat pain She is able to swallow sips of water Her pain is reasonably controlled with intermittent IV pain medicine She has no constipation  Objective:  Vitals:   03/29/21 0840 03/29/21 0850  BP: 137/74 (!) 143/82  Pulse: 65 65  Resp: 19 (!) 22  Temp:    SpO2: 92% 94%     Intake/Output Summary (Last 24 hours) at 03/29/2021 1052 Last data filed at 03/29/2021 7096 Gross per 24 hour  Intake 1655.64 ml  Output --  Net 1655.64 ml    GENERAL:alert, no distress and comfortable SKIN: skin color, texture, turgor are normal, no rashes or significant lesions EYES: normal, Conjunctiva are pink and non-injected, sclera clear OROPHARYNX:no exudate, no erythema and lips, buccal mucosa, and tongue normal  NECK: supple, thyroid normal size, non-tender, without nodularity LYMPH:  no palpable lymphadenopathy in the cervical, axillary or inguinal LUNGS: clear to auscultation and percussion with normal breathing effort HEART: regular rate & rhythm and no murmurs and no lower extremity edema ABDOMEN:abdomen soft, non-tender and normal bowel sounds Musculoskeletal:no cyanosis of digits and no clubbing  NEURO: alert & oriented x 3 with fluent speech, no focal motor/sensory deficits   Labs:  Recent Labs    11/23/20 0324 11/23/20 0421 03/22/21 1059 03/27/21 1755 03/28/21 0500 03/29/21 0502  NA 132*   < > 139 135 137 135  K 4.5   < > 2.9* 2.2* 2.2* 2.7*  CL 96*   < > 91* 92* 93* 94*  CO2 22   < > 32 34* 33* 31  GLUCOSE 212*   < > 170* 114* 105* 96  BUN 21   < > 10 11 10  7*  CREATININE 1.28*   < > 0.71 0.43* 0.47 0.42*  CALCIUM 9.5   < > 9.5 8.3* 8.5* 8.3*  GFRNONAA 46*   < > >60 >60 >60 >60  PROT 8.2*   < > 7.2 6.1* 6.2*  --   ALBUMIN 3.4*   < > 3.0* 3.0* 3.1*  --   AST 23   < > 33 57* 70*  --   ALT 23   < > 34 57* 67*  --   ALKPHOS 63   < > 72 42 46  --    BILITOT 0.9   < > 0.5 1.1 1.0  --   BILIDIR 0.2  --   --   --   --   --   IBILI 0.7  --   --   --   --   --    < > = values in this interval not displayed.    Studies: I have personally reviewed his CT imaging DG Chest 2 View  Result Date: 03/27/2021 CLINICAL DATA:  65 year old female with hypertension and diabetes. Stage IV uterine cancer. EXAM: CHEST - 2 VIEW COMPARISON:  Chest radiograph dated 02/25/2021. FINDINGS: Right-sided Port-A-Cath similar position. Small right pleural effusion similar to prior radiograph. Interval decrease in the size of the opacity seen in the right mid lung field. The left lung is clear. No pneumothorax. Stable cardiomediastinal silhouette. Degenerative changes of the spine. No acute osseous pathology. IMPRESSION: Decrease in the size of opacity in the right mid lung field compared to prior radiograph. Unchanged small right pleural effusion. Electronically Signed   By: Anner Crete M.D.   On: 03/27/2021 19:23   CT CHEST ABDOMEN PELVIS W CONTRAST  Result Date: 03/28/2021 CLINICAL DATA:  Ovarian cancer assess treatment response. EXAM: CT CHEST, ABDOMEN, AND PELVIS WITH CONTRAST TECHNIQUE: Multidetector CT imaging of the chest, abdomen and pelvis was performed following the standard protocol during bolus administration of intravenous contrast. CONTRAST:  11mL OMNIPAQUE IOHEXOL 300 MG/ML  SOLN COMPARISON:  PET-CT from December 08, 2020 and CT of the chest of Feb 25, 2021. FINDINGS: CT CHEST FINDINGS Cardiovascular: RIGHT-sided Port-A-Cath terminates in the lower RIGHT atrium similar to recent CT imaging, perhaps influenced by arm position as on the chest x-ray the tip is at the caval to atrial junction. Heart size is normal without substantial pericardial effusion. Normal caliber of the thoracic aorta. Normal caliber of the central pulmonary vasculature. Mediastinum/Nodes: No thoracic inlet adenopathy. No axillary adenopathy. No mediastinal lymphadenopathy. Esophagus  mildly patulous. Lungs/Pleura: Loculated RIGHT-sided pleural fluid with 6.5 x 3.6 cm axial dimension as compared to 8.1 x 3.9 cm. Basilar volume loss and signs of pleural thickening with decreased pleural thickening, markedly diminished since the prior PET and with continued decreased pleural thickening particularly in the inferior RIGHT chest even since the recent CT of the chest. Over the RIGHT hemidiaphragm pleural thickness approximately 7 mm as compared to 12 mm greatest thickness. LEFT chest is clear. Airways are patent. Musculoskeletal: See below for full musculoskeletal details. CT ABDOMEN PELVIS FINDINGS Hepatobiliary: Low-density lesions along the cephalad margin of the RIGHT hemi liver and across the top of the RIGHT hemidiaphragm and diminished in size considerably compared to the PET exam of February of 2022. Pleural thickening across the surface of the liver also with  decreased thickness (image 47/2) 7 mm greatest thickness as compared to 14 mm greatest thickness. Area of low attenuation along the capsule of the liver (image 45/2) overlying hepatic subsegment Roman numeral 8 measuring 18 mm as compared to 20 mm on the most recent comparison and is much as 43 mm on the prior CT evaluation. Capsular implant with extension into hepatic parenchyma much less conspicuous today than on the previous PET-CT from February and the CT from Feb 25, 2021. This area measuring approximately 1.8 as compared to 2.4 cm based on comparison with the most recent study. Much smaller than on the study of February of 2022 where it measured approximately 41 mm. Cystic area along the gallbladder fossa and in the inferior aspect of the RIGHT hemi liver with stable appearance largest area measuring 3 cm. Portal vein is patent. Hepatic veins are patent. Cholelithiasis without pericholecystic stranding. No sign of biliary duct distension. Pancreas: Normal, without mass, inflammation or ductal dilatation. Spleen: Spleen normal size  and contour. Adrenals/Urinary Tract: Adrenal glands are normal. Symmetric renal enhancement. No hydronephrosis. Smooth contour the urinary bladder. Stomach/Bowel: No acute gastrointestinal process soft tissue adjacent to the appendix in the RIGHT lower quadrant without signs of adjacent stranding is an unchanged caliber of the appendix dating back to February 2021 in the setting of known peritoneal disease with diminished fluid in the pelvis and omental and serosal nodularity throughout the abdomen. Vascular/Lymphatic: Normal caliber abdominal aorta and IVC. There is no gastrohepatic or hepatoduodenal ligament lymphadenopathy. No retroperitoneal or mesenteric lymphadenopathy. Reproductive: Decreased bulky appearance of the uterus. RIGHT adnexal cystic and solid area measuring 4.5 x 2.5 cm as compared to 5.9 x 3.1 cm. Decreased ascites in the pelvis. Other: Decreased ascites as above. Decreased signs of omental nodularity and serosal nodularity. Liver capsular disease as described above. Discrete nodules in the LEFT omentum persist, largest approximately 6 mm (image 88/2) previously 6-7 mm. Focal thickening of the LEFT rectus muscle is diminished no measurable lesion in this area, thickness of the LEFT rectus muscle on image 103 of series 2 is 13 mm as compared to 24 mm. The adjacent omental and peritoneal nodularity and stranding is subjectively diminished since the prior study. Musculoskeletal: Spinal degenerative changes. New sclerotic focus in the RIGHT proximal femur in the femoral neck and head junction measuring 14 mm. New sclerotic focus at the T12 level (image 60/2) 13 mm. Sclerosis at the T12 level is present however on the study of May of 2022. Not seen on the PET exam of February 2022. Spinal degenerative changes. IMPRESSION: 1. Decreased loculated pleural fluid and pleural thickening in the chest follows diminishing perihepatic soft tissue and capsular hepatic implants as described. 2. No adenopathy by  size criteria in the abdomen or pelvis. Minimal soft tissue in the intra-aortocaval groove is all that remains on image 77 of series 2 in the retroperitoneum. 3. Decreased bulk of the uterus subjectively and of RIGHT adnexal structures as described. 4. New areas of bony sclerosis compatible with skeletal metastasis. No signs of increased FDG uptake and no visible sclerosis on the previous PET. In the context of improvement of other disease it is possible that this represents treated metastasis. Would suggest attention on follow-up. 5. Loculated RIGHT-sided pleural fluid with signs of pleural thickening in the lower RIGHT chest otherwise with similar appearance. 6. Cholelithiasis without evidence of acute cholecystitis. 7. Aortic atherosclerosis. Electronically Signed   By: Zetta Bills M.D.   On: 03/28/2021 15:52

## 2021-03-29 NOTE — Anesthesia Preprocedure Evaluation (Addendum)
Anesthesia Evaluation  Patient identified by MRN, date of birth, ID band Patient awake    Reviewed: Allergy & Precautions, NPO status , Patient's Chart, lab work & pertinent test results, reviewed documented beta blocker date and time   Airway Mallampati: III  TM Distance: >3 FB Neck ROM: Full    Dental  (+) Poor Dentition, Dental Advisory Given, Chipped   Pulmonary COPD,  COPD inhaler,    Pulmonary exam normal breath sounds clear to auscultation       Cardiovascular hypertension, Pt. on medications and Pt. on home beta blockers Normal cardiovascular exam Rhythm:Regular Rate:Normal     Neuro/Psych PSYCHIATRIC DISORDERS Anxiety Depression Cervical radiculopathy  Neuromuscular disease    GI/Hepatic GERD  Medicated and Controlled,Metastatic liver disease Odynophagia Dysphagia    Endo/Other  diabetes, Well Controlled, Type 2  Renal/GU Hypokalemia  negative genitourinary   Musculoskeletal  (+) Arthritis , Osteoarthritis,    Abdominal   Peds  Hematology  (+) anemia , Thrombocytopenia   Anesthesia Other Findings   Reproductive/Obstetrics Uterine Ca                            Anesthesia Physical Anesthesia Plan  ASA: III  Anesthesia Plan: MAC   Post-op Pain Management:    Induction: Intravenous  PONV Risk Score and Plan: 2 and Treatment may vary due to age or medical condition and Propofol infusion  Airway Management Planned: Natural Airway and Nasal Cannula  Additional Equipment:   Intra-op Plan:   Post-operative Plan:   Informed Consent: I have reviewed the patients History and Physical, chart, labs and discussed the procedure including the risks, benefits and alternatives for the proposed anesthesia with the patient or authorized representative who has indicated his/her understanding and acceptance.     Dental advisory given  Plan Discussed with: CRNA and  Anesthesiologist  Anesthesia Plan Comments:         Anesthesia Quick Evaluation

## 2021-03-29 NOTE — Anesthesia Postprocedure Evaluation (Signed)
Anesthesia Post Note  Patient: Yvette Roberts  Procedure(s) Performed: ESOPHAGOGASTRODUODENOSCOPY (EGD) (N/A ) BIOPSY     Patient location during evaluation: PACU Anesthesia Type: MAC Level of consciousness: awake and alert Pain management: pain level controlled Vital Signs Assessment: post-procedure vital signs reviewed and stable Respiratory status: spontaneous breathing, nonlabored ventilation and respiratory function stable Cardiovascular status: stable and blood pressure returned to baseline Postop Assessment: no apparent nausea or vomiting Anesthetic complications: no   No complications documented.  Last Vitals:  Vitals:   03/29/21 0840 03/29/21 0850  BP: 137/74 (!) 143/82  Pulse: 65 65  Resp: 19 (!) 22  Temp:    SpO2: 92% 94%    Last Pain:  Vitals:   03/29/21 0850  TempSrc:   PainSc: 0-No pain                 Darya Bigler A.

## 2021-03-30 ENCOUNTER — Encounter (HOSPITAL_COMMUNITY): Payer: Self-pay | Admitting: Gastroenterology

## 2021-03-30 ENCOUNTER — Telehealth: Payer: Self-pay | Admitting: Hematology and Oncology

## 2021-03-30 DIAGNOSIS — R1310 Dysphagia, unspecified: Secondary | ICD-10-CM

## 2021-03-30 DIAGNOSIS — E119 Type 2 diabetes mellitus without complications: Secondary | ICD-10-CM

## 2021-03-30 DIAGNOSIS — J910 Malignant pleural effusion: Secondary | ICD-10-CM

## 2021-03-30 DIAGNOSIS — K209 Esophagitis, unspecified without bleeding: Secondary | ICD-10-CM | POA: Diagnosis not present

## 2021-03-30 DIAGNOSIS — E43 Unspecified severe protein-calorie malnutrition: Secondary | ICD-10-CM

## 2021-03-30 DIAGNOSIS — I10 Essential (primary) hypertension: Secondary | ICD-10-CM

## 2021-03-30 DIAGNOSIS — C55 Malignant neoplasm of uterus, part unspecified: Secondary | ICD-10-CM

## 2021-03-30 LAB — URINALYSIS, ROUTINE W REFLEX MICROSCOPIC
Bacteria, UA: NONE SEEN
Bilirubin Urine: NEGATIVE
Glucose, UA: NEGATIVE mg/dL
Ketones, ur: 20 mg/dL — AB
Nitrite: NEGATIVE
Protein, ur: NEGATIVE mg/dL
Specific Gravity, Urine: 1.009 (ref 1.005–1.030)
pH: 7 (ref 5.0–8.0)

## 2021-03-30 LAB — CBC
HCT: 25.5 % — ABNORMAL LOW (ref 36.0–46.0)
Hemoglobin: 8.2 g/dL — ABNORMAL LOW (ref 12.0–15.0)
MCH: 27.8 pg (ref 26.0–34.0)
MCHC: 32.2 g/dL (ref 30.0–36.0)
MCV: 86.4 fL (ref 80.0–100.0)
Platelets: 76 10*3/uL — ABNORMAL LOW (ref 150–400)
RBC: 2.95 MIL/uL — ABNORMAL LOW (ref 3.87–5.11)
RDW: 16.1 % — ABNORMAL HIGH (ref 11.5–15.5)
WBC: 1.1 10*3/uL — CL (ref 4.0–10.5)
nRBC: 0 % (ref 0.0–0.2)

## 2021-03-30 LAB — BASIC METABOLIC PANEL
Anion gap: 8 (ref 5–15)
BUN: <5 mg/dL — ABNORMAL LOW (ref 8–23)
CO2: 28 mmol/L (ref 22–32)
Calcium: 8.2 mg/dL — ABNORMAL LOW (ref 8.9–10.3)
Chloride: 99 mmol/L (ref 98–111)
Creatinine, Ser: 0.44 mg/dL (ref 0.44–1.00)
GFR, Estimated: >60 mL/min (ref >60)
Glucose, Bld: 101 mg/dL — ABNORMAL HIGH (ref 70–99)
Potassium: 4.1 mmol/L (ref 3.5–5.1)
Sodium: 135 mmol/L (ref 135–145)

## 2021-03-30 MED ORDER — LIP MEDEX EX OINT: 1.0000 "application " | TOPICAL_OINTMENT | CUTANEOUS | Status: DC | PRN

## 2021-03-30 MED ORDER — PANTOPRAZOLE SODIUM 40 MG PO PACK
40.0000 mg | PACK | Freq: Two times a day (BID) | ORAL | Status: DC
  Administered 2021-03-30 – 2021-04-17 (×33): 40 mg via ORAL
  Filled 2021-03-30 (×39): qty 20

## 2021-03-30 MED ORDER — MAGNESIUM SULFATE 2 GM/50ML IV SOLN: 2.0000 g | Freq: Once | INTRAVENOUS | Status: DC

## 2021-03-30 MED ORDER — LIP MEDEX EX OINT
TOPICAL_OINTMENT | CUTANEOUS | Status: AC
  Filled 2021-03-30: qty 7

## 2021-03-30 NOTE — Progress Notes (Signed)
[facility] Progress Note  Yvette Roberts 65 y.o. December 28, 1955  CC:   Esophagitis   Subjective: Patient seen and examined at bedside.  She is feeling better.  Tolerating liquid diet.  Denies abdominal pain, diarrhea and constipation.  [name] : Negative for fever.  Negative for chest pain.   Objective: Vital signs in last 24 hours: Vitals:   03/29/21 1959 03/30/21 0649  BP: 121/71 (!) 146/88  Pulse: 67 76  Resp: 16   Temp: 98.5 F (36.9 C) 98.1 F (36.7 C)  SpO2: 98% 97%    Physical Exam:  General:  Alert, cooperative, no distress, appears stated age  Head:  Normocephalic, without obvious abnormality, atraumatic  Eyes:  , EOM's intact,   Lungs:    No visible respiratory distress  Heart:  Regular rate and rhythm, S1, S2 normal  Abdomen:   Soft, non-tender, nondistended, bowel sound present  Extremities: Extremities normal, atraumatic, no  edema       Lab Results: Recent Labs    03/29/21 0502 03/29/21 2303 03/30/21 0553  NA 135  --  135  K 2.7* 2.7* 4.1  CL 94*  --  99  CO2 31  --  28  GLUCOSE 96  --  101*  BUN 7*  --  <5*  CREATININE 0.42*  --  0.44  CALCIUM 8.3*  --  8.2*  MG 1.6* 1.8  --   PHOS 2.1*  --   --    Recent Labs    03/27/21 1755 03/28/21 0500  AST 57* 70*  ALT 57* 67*  ALKPHOS 42 46  BILITOT 1.1 1.0  PROT 6.1* 6.2*  ALBUMIN 3.0* 3.1*   Recent Labs    03/29/21 0502 03/30/21 0553  WBC 1.5* 1.1*  HGB 8.8* 8.2*  HCT 27.3* 25.5*  MCV 86.1 86.4  PLT 93* 76*   No results for input(s): LABPROT, INR in the last 72 hours.    Assessment/Plan: -Odynophagia.  EGD yesterday showed mild esophagitis.  Biopsies pending -Metastatic uterine cancer -Abnormal weight loss -Pancytopenia  Recommendations ------------------------ -Advance diet to soft.  Slowly advance diet as tolerated. -Continue Protonix twice daily and Carafate -GI will sign off.  Call us back if needed   Otis Brace MD, FACP 03/30/2021, 12:23 PM  Contact #   [phone]

## 2021-03-30 NOTE — Progress Notes (Signed)
Yvette Roberts   DOB:1955/12/25   WI#:[id number]    ASSESSMENT & PLAN:   Metastatic Uterine cancer ([place]) I have personally reviewed CT imaging She has good response to treatment Continue supportive care  Esophagitis She has profound symptoms of esophagitis Appreciated GI work-up I have reviewed EGD report She will continue antiacid medications as prescribed by GI service  Dysphagia This could be related to esophagitis  She will attempt to swallow more now that her pain is better controlled; however, it appears that the patient is somewhat reluctant to attend swallowing whatsoever I have reviewed this with her nursing staff to give her numbing medicine before attempt to swallow  Cancer associated pain She has severe esophageal pain and right upper quadrant pain and not able to take pain medicine She will continue pain medicine as prescribed  Abnormal weight loss She has severe, profound weight loss, initially due to undiagnosed cancer and now inability to swallow with cachexia On February 23, she weighed approximately 223 pounds As of last week, she weighed only 169 pounds I am somewhat reluctant to recommend parenteral nutrition; there is high risk of infection especially in the setting of severe pancytopenia We discussed the risk and benefits of feeding tube placement and the patient declined I warned the patient that she is not likely going to improve with near 0 nutritional intake  Acquired pancytopenia She does not need G-CSF support unless she has fever She does not need blood transfusion unless hemoglobin is less than 8 or platelet count less than 10 Monitor closely  Severe electrolyte imbalance Due to poor oral intake Continue replacement therapy  Goals of care, counseling/discussion I have reviewed goals of care discussion in the purpose of admission with the patient and her niece and they are both in agreement to be admitted  Discharge planning She is  not ready to be discharged until she can tolerate oral diet and able to swallow her pills I will return to check on her next week  All questions were answered. The patient knows to call the clinic with any problems, questions or concerns.  [name], MD 03/30/2021 7:50 AM  Subjective:  She is seen this morning The patient has been spitting her saliva every 10 to 15 minutes She is reluctant to swallow her own saliva because of pain She has not used any of the numbing medicine that was prescribed She is reluctant to swallow any food According to the nursing staff, she drank several sips of water and soup last night She denies nausea No documented fever  Objective:  Vitals:   03/29/21 1959 03/30/21 0649  BP: 121/71 (!) 146/88  Pulse: 67 76  Resp: 16   Temp: 98.5 F (36.9 C) 98.1 F (36.7 C)  SpO2: 98% 97%     Intake/Output Summary (Last 24 hours) at 03/30/2021 0750 Last data filed at 03/30/2021 0600 Gross per 24 hour  Intake 2903.03 ml  Output --  Net 2903.03 ml    GENERAL:alert, no distress and comfortable NEURO: alert & oriented x 3 with fluent speech, no focal motor/sensory deficits   Labs:  Recent Labs    11/23/20 0324 11/23/20 0421 03/22/21 1059 03/27/21 1755 03/28/21 0500 03/29/21 0502 03/29/21 2303 03/30/21 0553  NA 132*   < > 139 135 137 135  --  135  K 4.5   < > 2.9* 2.2* 2.2* 2.7* 2.7* 4.1  CL 96*   < > 91* 92* 93* 94*  --  99  CO2  22   < > 32 34* 33* 31  --  28  GLUCOSE 212*   < > 170* 114* 105* 96  --  101*  BUN 21   < > 10 11 10  7*  --  <5*  CREATININE 1.28*   < > 0.71 0.43* 0.47 0.42*  --  0.44  CALCIUM 9.5   < > 9.5 8.3* 8.5* 8.3*  --  8.2*  GFRNONAA 46*   < > >60 >60 >60 >60  --  >60  PROT 8.2*   < > 7.2 6.1* 6.2*  --   --   --   ALBUMIN 3.4*   < > 3.0* 3.0* 3.1*  --   --   --   AST 23   < > 33 57* 70*  --   --   --   ALT 23   < > 34 57* 67*  --   --   --   ALKPHOS 63   < > 72 42 46  --   --   --   BILITOT 0.9   < > 0.5 1.1 1.0  --   --   --    BILIDIR 0.2  --   --   --   --   --   --   --   IBILI 0.7  --   --   --   --   --   --   --    < > = values in this interval not displayed.    Studies:  DG Chest 2 View  Result Date: 03/27/2021 CLINICAL DATA:  65 year old female with hypertension and diabetes. Stage IV uterine cancer. EXAM: CHEST - 2 VIEW COMPARISON:  Chest radiograph dated 02/25/2021. FINDINGS: Right-sided Port-A-Cath similar position. Small right pleural effusion similar to prior radiograph. Interval decrease in the size of the opacity seen in the right mid lung field. The left lung is clear. No pneumothorax. Stable cardiomediastinal silhouette. Degenerative changes of the spine. No acute osseous pathology. IMPRESSION: Decrease in the size of opacity in the right mid lung field compared to prior radiograph. Unchanged small right pleural effusion. Electronically Signed   By: Anner Crete M.D.   On: 03/27/2021 19:23   CT CHEST ABDOMEN PELVIS W CONTRAST  Result Date: 03/28/2021 CLINICAL DATA:  Ovarian cancer assess treatment response. EXAM: CT CHEST, ABDOMEN, AND PELVIS WITH CONTRAST TECHNIQUE: Multidetector CT imaging of the chest, abdomen and pelvis was performed following the standard protocol during bolus administration of intravenous contrast. CONTRAST:  138mL OMNIPAQUE IOHEXOL 300 MG/ML  SOLN COMPARISON:  PET-CT from December 08, 2020 and CT of the chest of Feb 25, 2021. FINDINGS: CT CHEST FINDINGS Cardiovascular: RIGHT-sided Port-A-Cath terminates in the lower RIGHT atrium similar to recent CT imaging, perhaps influenced by arm position as on the chest x-ray the tip is at the caval to atrial junction. Heart size is normal without substantial pericardial effusion. Normal caliber of the thoracic aorta. Normal caliber of the central pulmonary vasculature. Mediastinum/Nodes: No thoracic inlet adenopathy. No axillary adenopathy. No mediastinal lymphadenopathy. Esophagus mildly patulous. Lungs/Pleura: Loculated RIGHT-sided pleural  fluid with 6.5 x 3.6 cm axial dimension as compared to 8.1 x 3.9 cm. Basilar volume loss and signs of pleural thickening with decreased pleural thickening, markedly diminished since the prior PET and with continued decreased pleural thickening particularly in the inferior RIGHT chest even since the recent CT of the chest. Over the RIGHT hemidiaphragm pleural thickness approximately 7 mm as compared to 12  mm greatest thickness. LEFT chest is clear. Airways are patent. Musculoskeletal: See below for full musculoskeletal details. CT ABDOMEN PELVIS FINDINGS Hepatobiliary: Low-density lesions along the cephalad margin of the RIGHT hemi liver and across the top of the RIGHT hemidiaphragm and diminished in size considerably compared to the PET exam of February of 2022. Pleural thickening across the surface of the liver also with decreased thickness (image 47/2) 7 mm greatest thickness as compared to 14 mm greatest thickness. Area of low attenuation along the capsule of the liver (image 45/2) overlying hepatic subsegment Roman numeral 8 measuring 18 mm as compared to 20 mm on the most recent comparison and is much as 43 mm on the prior CT evaluation. Capsular implant with extension into hepatic parenchyma much less conspicuous today than on the previous PET-CT from February and the CT from Feb 25, 2021. This area measuring approximately 1.8 as compared to 2.4 cm based on comparison with the most recent study. Much smaller than on the study of February of 2022 where it measured approximately 41 mm. Cystic area along the gallbladder fossa and in the inferior aspect of the RIGHT hemi liver with stable appearance largest area measuring 3 cm. Portal vein is patent. Hepatic veins are patent. Cholelithiasis without pericholecystic stranding. No sign of biliary duct distension. Pancreas: Normal, without mass, inflammation or ductal dilatation. Spleen: Spleen normal size and contour. Adrenals/Urinary Tract: Adrenal glands are normal.  Symmetric renal enhancement. No hydronephrosis. Smooth contour the urinary bladder. Stomach/Bowel: No acute gastrointestinal process soft tissue adjacent to the appendix in the RIGHT lower quadrant without signs of adjacent stranding is an unchanged caliber of the appendix dating back to February 2021 in the setting of known peritoneal disease with diminished fluid in the pelvis and omental and serosal nodularity throughout the abdomen. Vascular/Lymphatic: Normal caliber abdominal aorta and IVC. There is no gastrohepatic or hepatoduodenal ligament lymphadenopathy. No retroperitoneal or mesenteric lymphadenopathy. Reproductive: Decreased bulky appearance of the uterus. RIGHT adnexal cystic and solid area measuring 4.5 x 2.5 cm as compared to 5.9 x 3.1 cm. Decreased ascites in the pelvis. Other: Decreased ascites as above. Decreased signs of omental nodularity and serosal nodularity. Liver capsular disease as described above. Discrete nodules in the LEFT omentum persist, largest approximately 6 mm (image 88/2) previously 6-7 mm. Focal thickening of the LEFT rectus muscle is diminished no measurable lesion in this area, thickness of the LEFT rectus muscle on image 103 of series 2 is 13 mm as compared to 24 mm. The adjacent omental and peritoneal nodularity and stranding is subjectively diminished since the prior study. Musculoskeletal: Spinal degenerative changes. New sclerotic focus in the RIGHT proximal femur in the femoral neck and head junction measuring 14 mm. New sclerotic focus at the T12 level (image 60/2) 13 mm. Sclerosis at the T12 level is present however on the study of May of 2022. Not seen on the PET exam of February 2022. Spinal degenerative changes. IMPRESSION: 1. Decreased loculated pleural fluid and pleural thickening in the chest follows diminishing perihepatic soft tissue and capsular hepatic implants as described. 2. No adenopathy by size criteria in the abdomen or pelvis. Minimal soft tissue in  the intra-aortocaval groove is all that remains on image 77 of series 2 in the retroperitoneum. 3. Decreased bulk of the uterus subjectively and of RIGHT adnexal structures as described. 4. New areas of bony sclerosis compatible with skeletal metastasis. No signs of increased FDG uptake and no visible sclerosis on the previous PET. In the context of  improvement of other disease it is possible that this represents treated metastasis. Would suggest attention on follow-up. 5. Loculated RIGHT-sided pleural fluid with signs of pleural thickening in the lower RIGHT chest otherwise with similar appearance. 6. Cholelithiasis without evidence of acute cholecystitis. 7. Aortic atherosclerosis. Electronically Signed   By: Zetta Bills M.D.   On: 03/28/2021 15:52

## 2021-03-30 NOTE — Progress Notes (Signed)
CRITICAL LAB RESULT, WBC-1.1 ON CALL PROVIDER NOTIFIED

## 2021-03-30 NOTE — Care Management Important Message (Signed)
Important Message  Patient Details IM Letter given to the Patient. Name: Yvette Roberts MRN: [id number] Date of Birth: 1956/05/07   Medicare Important Message Given:  Yes     Kerin Salen 03/30/2021, 9:42 AM

## 2021-03-30 NOTE — Progress Notes (Addendum)
PROGRESS NOTE    Yvette Roberts  XFG:[id number] DOB: [birth year] DOA: 03/27/2021 PCP: [name], FNP   Brief Narrative:  Patient is a 65 year old African-American female with a past medical history significant for but not limited to hypertension, type 2 diabetes mellitus, stage IV uterine cancer, history malignant pleural effusion diagnosed in February 2022 followed by Dr. Simeon Craft such currently on chemotherapy who had multiple symptoms most notably severe odynophagia the resultant minimal intake and profound weight loss.  She had been prescribed liquid medications for supportive care however unable to get them.  She reports that she lost about 50 to 60 pounds in last 3 months and that her main complaint is severe odynophagia with any attempt at swallowing.  She also admitted to having some mild abdominal discomfort which is somewhat chronic and unchanged.  She underwent further work-up and had an EGD and found nonsevere esophagitis in the proximal and distal esophagus as well as scattered mild inflammation with erythema in the entire stomach.  Medical oncology has been consulted as well and recommending trying Magic mouthwash with lidocaine prior to attempting to eat.  She is on the PPI and sucralfate.  The out of her mouth and is reluctant swallow her own saliva because of the pain.  We cannot safely discharge the patient until she is able to tolerate opioid diet and able to swallow her pills  Assessment & Plan:   Principal Problem:   Esophagitis Active Problems:   Type 2 diabetes mellitus without complication (HCC)   Hypertension   Uterine cancer (HCC)   Malignant pleural effusion   Protein-calorie malnutrition, severe ([place])   Odynophagia  Odynophagia and Esophagitis GERD -GI was consulted and underwent EGD on 03/29/21 which showed mild nonsevere esophagitis in the proximal and distal esophagus as well as scattered mild inflammation with erythema in the entire stomach but she has  profound symptoms of Esophagitis  -Her Bx are pending -Her IV fluconazole has nos been discontinued but continuing Nystatin 500,000 units po 4x Daily  -Her Odynophagia could be related to Esophagitis and medical oncology encouraged her to try and swallow more now that her pain is better controlled; medical oncology recommends trying numbing medicine with Magic mouthwash and lidocaine before attempting to swallow given that the patient is somewhat reluctant to try swallowing -GI recommending continuing Pantoprazole BID and Carafate  1 gram po TIDwm and qHS as well as advancing diet to Soft and Slowly  Advance diet as tolerated  -Patient to follow up with GI in the outpatient setting   Pancytopenia -Patient's WBC is now 1.1, Hgb/Hct is now 8.2/25.5, and Platelet Count is 76 and in the setting of her chemotherapy -Oncology recommending not needing any G-CSF support unless she has a fever -They are also recommending no blood transfusion unless her hemoglobin less than 8 and her platelet count is less than 10 or if she has active bleeding -Continue monitor and trend and repeat CBC in a.m.  Foul Smelling Urine -Check U/A and Urine Cx -Her WBC is 1.1 but she is afebrile -Continue monitor and trend and follow-up on urine studies  Hypokalemia -Was replete and improved -Patient is potassium went from 2.7 is now 4.1 -Continue monitor and replete as necessary -Repeat CMP in a.m.  Severe Protein Calorie Malnutrition -Nutritionist was consulted for further evaluation and recommendations however she has not really taken p.o. was yesterday -Estimated body mass index is 27.37 kg/m as calculated from the following:   Height as of 03/22/21: 5\' 6"  (  1.676 m).   Weight as of 03/22/21: 76.9 kg.  -Her prealbumin was 16 -She refused a [place] tube feeding -She will need to stay in house until she is able to keep herself nutritionally replete either by p.o. or some other modality which is to be  discussed  Metastatic Uterine Cancer -She underwent a chest CT of the abdomen pelvis which showed "Decreased loculated pleural fluid and pleural thickening in the chest follows diminishing perihepatic soft tissue and capsular hepatic implants as described.2. No adenopathy by size criteria in the abdomen or pelvis. Minimal soft tissue in the intra-aortocaval groove is all that remains on image 77 of series 2 in the retroperitoneum. 3. Decreased bulk of the uterus subjectively and of RIGHT adnexal structures as described. 4. New areas of bony sclerosis compatible with skeletal metastasis. No signs of increased FDG uptake and no visible sclerosis on the  previous PET. In the context of improvement of other disease it is possible that this represents treated metastasis. Would suggest attention on follow-up. 5. Loculated RIGHT-sided pleural fluid with signs of pleural thickening in the lower RIGHT chest otherwise with similar appearance. 6. Cholelithiasis without evidence of acute cholecystitis. 7. Aortic atherosclerosis." -Her medical oncologist has reviewed the CT scans and feels that she has had good response to treatment and recommending continuing supportive care -Continue with pain control with Hydromorphone 1 mg IV q3 prn Severe Pain and Gabapentin 200 mg po BID -C/w Antiemetics and Supportive Care with Ondansetron 4 mg po/IV q6hprn Nausea   Malignant Pleural Effusion -CT Scan showed "Loculated RIGHT-sided pleural fluid with 6.5 x 3.6 cm axial dimension as compared to 8.1 x 3.9 cm. Basilar volume loss and signs of pleural thickening with decreased pleural thickening, markedly diminished since the prior PET and with continued decreased pleural thickening particularly in the inferior RIGHT chest even since the recent CT of the chest. Over the RIGHT hemidiaphragm pleural thickness approximately 7 mm as compared to 12 mm greatest thickness. LEFT chest is clear. Airways are patent." with the impression being  "Decreased loculated pleural fluid and pleural thickening in the chest follows diminishing perihepatic soft tissue and capsular hepatic implants as described" -No Respiratory Distress   Hypertension -C/w Carvedilol 12.5 mg po BID  Diabetes mellitus Type 2 -Last hemoglobin A1c was 6.1 -Blood sugars ranging from 96-101 on daily BMPs -continue to monitor blood sugars carefully and if necessary will add sensitive NovoLog sliding scale insulin AC  Anxiety and Depression -C/w mirtazapine 50 mg p.o. nightly and fluoxetine 20 mg p.o. daily  DVT prophylaxis: Enoxaparin 40 mg sq q24h Code Status: FULL CODE Family Communication: No family present at bedside  Disposition Plan: Pending further improvement and tolerance of diet and clearance by medical oncology  Status is: Inpatient  Remains inpatient appropriate because:Unsafe d/c plan, IV treatments appropriate due to intensity of illness or inability to take PO and Inpatient level of care appropriate due to severity of illness   Dispo: The patient is from: Home              Anticipated d/c is to: Home              Patient currently is not medically stable to d/c.   Difficult to place patient No  Consultants:   Medical Oncology   Gastroenterology    Procedures:  EGD Findings:      Non-severe esophagitis was found in the proximal and distal esophagus.       Biopsies were taken with a  cold forceps for histology.      Scattered mild inflammation characterized by erythema was found in the       entire examined stomach. Biopsies were taken with a cold forceps for       histology.      The cardia and gastric fundus were normal on retroflexion.      The duodenal bulb, first portion of the duodenum and second portion of       the duodenum were normal. Impression:               - Non-severe esophagitis. Biopsied.                           - Gastritis. Biopsied.                           - Normal duodenal bulb, first portion of the                             duodenum and second portion of the duodenum.  Antimicrobials:  Anti-infectives (From admission, onward)   Start     Dose/Rate Route Frequency Ordered Stop   03/27/21 1800  fluconazole (DIFLUCAN) IVPB 200 mg  Status:  Discontinued        200 mg 100 mL/hr over 60 Minutes Intravenous Daily 03/27/21 1734 03/29/21 1633        Subjective: Seen and examined and states that she is not having very much pain but continues to spit out her saliva.  Did have some discomfort.  No nausea or vomiting.  No lightheadedness or dizziness.  Has not really eaten much.  No other concerns or complaints at this time.  Objective: Vitals:   03/29/21 0850 03/29/21 1441 03/29/21 1959 03/30/21 0649  BP: (!) 143/82 118/72 121/71 (!) 146/88  Pulse: 65 67 67 76  Resp: (!) 22  16   Temp:  98.7 F (37.1 C) 98.5 F (36.9 C) 98.1 F (36.7 C)  TempSrc:  Oral Oral Oral  SpO2: 94% 98% 98% 97%    Intake/Output Summary (Last 24 hours) at 03/30/2021 1439 Last data filed at 03/30/2021 1030 Gross per 24 hour  Intake 2993.03 ml  Output --  Net 2993.03 ml   There were no vitals filed for this visit.  Examination: Physical Exam:  Constitutional: Chronically ill-appearing older appearing than her stated age African-American female currently in no acute distress appears calm but slightly withdrawn and states but now her saliva in her emesis bag Eyes: Lids and conjunctivae normal, sclerae anicteric  ENMT: External Ears, Nose appear normal. Grossly normal hearing. Neck: Appears normal, supple, no cervical masses, normal ROM, no appreciable thyromegaly; no JVD Respiratory: Diminished to auscultation bilaterally with coarse breath sounds worse on the right compared to the Left with Slight crackles, no wheezing, rales, rhonchi. Normal respiratory effort and patient is not tachypenic. No accessory muscle use. Not wearing Supplemental O2 via [place] Cardiovascular: RRR, no murmurs / rubs / gallops. No carotid  bruits.  Abdomen: Soft, non-tender, non-distended. No masses palpated. No appreciable hepatosplenomegaly. Bowel sounds positive.  GU: Deferred. Musculoskeletal: No clubbing / cyanosis of digits/nails. No joint deformity upper and lower extremities.  Skin: No rashes, lesions, ulcers on a limited skin evaluation. No induration; Warm and dry.  Neurologic: CN 2-12 grossly intact with no focal deficits.  Romberg sign and cerebellar reflexes not  assessed.  Psychiatric: Normal judgment and insight. Alert and oriented x 3. Normal mood and appropriate affect.   Data Reviewed: I have personally reviewed following labs and imaging studies  CBC: Recent Labs  Lab 03/27/21 1755 03/28/21 0500 03/29/21 0502 03/30/21 0553  WBC 2.6* 1.9* 1.5* 1.1*  HGB 9.4* 9.6* 8.8* 8.2*  HCT 29.0* 29.8* 27.3* 25.5*  MCV 86.1 86.4 86.1 86.4  PLT 108* 110* 93* 76*   Basic Metabolic Panel: Recent Labs  Lab 03/27/21 1755 03/28/21 0500 03/28/21 0900 03/29/21 0502 03/29/21 2303 03/30/21 0553  NA 135 137  --  135  --  135  K 2.2* 2.2*  --  2.7* 2.7* 4.1  CL 92* 93*  --  94*  --  99  CO2 34* 33*  --  31  --  28  GLUCOSE 114* 105*  --  96  --  101*  BUN 11 10  --  7*  --  <5*  CREATININE 0.43* 0.47  --  0.42*  --  0.44  CALCIUM 8.3* 8.5*  --  8.3*  --  8.2*  MG  --   --  1.6* 1.6* 1.8  --   PHOS  --   --   --  2.1*  --   --    GFR: Estimated Creatinine Clearance: 73.4 mL/min (by C-G formula based on SCr of 0.44 mg/dL). Liver Function Tests: Recent Labs  Lab 03/27/21 1755 03/28/21 0500  AST 57* 70*  ALT 57* 67*  ALKPHOS 42 46  BILITOT 1.1 1.0  PROT 6.1* 6.2*  ALBUMIN 3.0* 3.1*   No results for input(s): LIPASE, AMYLASE in the last 168 hours. No results for input(s): AMMONIA in the last 168 hours. Coagulation Profile: No results for input(s): INR, PROTIME in the last 168 hours. Cardiac Enzymes: No results for input(s): CKTOTAL, CKMB, CKMBINDEX, TROPONINI in the last 168 hours. BNP (last 3  results) No results for input(s): PROBNP in the last 8760 hours. HbA1C: Recent Labs    03/27/21 1755  HGBA1C 6.1*   CBG: No results for input(s): GLUCAP in the last 168 hours. Lipid Profile: No results for input(s): CHOL, HDL, LDLCALC, TRIG, CHOLHDL, LDLDIRECT in the last 72 hours. Thyroid Function Tests: No results for input(s): TSH, T4TOTAL, FREET4, T3FREE, THYROIDAB in the last 72 hours. Anemia Panel: No results for input(s): VITAMINB12, FOLATE, FERRITIN, TIBC, IRON, RETICCTPCT in the last 72 hours. Sepsis Labs: No results for input(s): PROCALCITON, LATICACIDVEN in the last 168 hours.  Recent Results (from the past 240 hour(s))  SARS CORONAVIRUS 2 (TAT 6-24 HRS) Nasopharyngeal Nasopharyngeal Swab     Status: None   Collection Time: 03/28/21  5:20 AM   Specimen: Nasopharyngeal Swab  Result Value Ref Range Status   SARS Coronavirus 2 NEGATIVE NEGATIVE Final    Comment: (NOTE) SARS-CoV-2 target nucleic acids are NOT DETECTED.  The SARS-CoV-2 RNA is generally detectable in upper and lower respiratory specimens during the acute phase of infection. Negative results do not preclude SARS-CoV-2 infection, do not rule out co-infections with other pathogens, and should not be used as the sole basis for treatment or other patient management decisions. Negative results must be combined with clinical observations, patient history, and epidemiological information. The expected result is Negative.  Fact Sheet for Patients: SugarRoll.be  Fact Sheet for Healthcare Providers: https://www.woods-mathews.com/  This test is not yet approved or cleared by the [place] FDA and  has been authorized for detection and/or diagnosis of SARS-CoV-2 by FDA under an  Emergency Use Authorization (EUA). This EUA will remain  in effect (meaning this test can be used) for the duration of the COVID-19 declaration under Se ction 564(b)(1) of the Act, 21  U.S.C. section 360bbb-3(b)(1), unless the authorization is terminated or revoked sooner.  Performed at [facility], [place] [address] [place], [place] [address]     RN Pressure Injury Documentation:     Estimated body mass index is 27.37 kg/m as calculated from the following:   Height as of 03/22/21: 5\' 6"  (1.676 m).   Weight as of 03/22/21: 76.9 kg.  Malnutrition Type:   Malnutrition Characteristics:   Nutrition Interventions:    Radiology Studies: No results found.  Scheduled Meds: . carvedilol  12.5 mg Oral BID WC  . chlorhexidine  15 mL Mouth Rinse BID  . Chlorhexidine Gluconate Cloth  6 each Topical Daily  . enoxaparin (LOVENOX) injection  40 mg Subcutaneous Q24H  . FLUoxetine  20 mg Oral Daily  . gabapentin  200 mg Oral BID  . magic mouthwash w/lidocaine  2 mL Oral TID  . mouth rinse  15 mL Mouth Rinse q12n4p  . melatonin  10 mg Oral QHS  . mirtazapine  15 mg Oral QHS  . nystatin  5 mL Oral QID  . pantoprazole sodium  40 mg Oral BID  . polyethylene glycol  17 g Oral Daily  . sucralfate  1 g Oral TID WC & HS   Continuous Infusions:   LOS: 3 days   Kerney Elbe, DO [facility] PAGER is on AMION  If 7PM-7AM, please contact night-coverage [email or web address]

## 2021-03-30 NOTE — Progress Notes (Deleted)
Critical Lab, K+- 2.7, Provider on call, (B.Kyere) notified. Awaiting orders

## 2021-03-30 NOTE — Progress Notes (Signed)
Critical Lab, K+ 2.7, Provider on call [facility]) notified. Awaiting orders.

## 2021-03-30 NOTE — Telephone Encounter (Signed)
I spoke with Yvette Roberts and gave her an update I have addressed all her questions

## 2021-03-30 NOTE — Plan of Care (Signed)
  Problem: Nutrition: Goal: Adequate nutrition will be maintained Outcome: Progressing   Problem: Coping: Goal: Level of anxiety will decrease Outcome: Progressing   Problem: Pain Managment: Goal: General experience of comfort will improve Outcome: Progressing   

## 2021-03-31 ENCOUNTER — Inpatient Hospital Stay (HOSPITAL_COMMUNITY): Payer: Medicare Other

## 2021-03-31 LAB — CBC WITH DIFFERENTIAL/PLATELET
Band Neutrophils: 0 %
Basophils Absolute: 0 10*3/uL (ref 0.0–0.1)
Basophils Relative: 4 %
Blasts: NONE SEEN %
Eosinophils Absolute: 0 10*3/uL (ref 0.0–0.5)
Eosinophils Relative: 0 %
HCT: 25.7 % — ABNORMAL LOW (ref 36.0–46.0)
Hemoglobin: 8.5 g/dL — ABNORMAL LOW (ref 12.0–15.0)
Lymphocytes Relative: 88 %
Lymphs Abs: 0.7 10*3/uL (ref 0.7–4.0)
MCH: 28.4 pg (ref 26.0–34.0)
MCHC: 33.1 g/dL (ref 30.0–36.0)
MCV: 86 fL (ref 80.0–100.0)
Metamyelocytes Relative: NONE SEEN %
Monocytes Absolute: 0.2 10*3/uL (ref 0.1–1.0)
Monocytes Relative: 4 %
Myelocytes: NONE SEEN %
Neutro Abs: 0.1 10*3/uL — CL (ref 1.7–7.7)
Neutrophils Relative %: 4 %
Platelets: 69 10*3/uL — ABNORMAL LOW (ref 150–400)
Promyelocytes Relative: NONE SEEN %
RBC Morphology: NORMAL
RBC: 2.99 MIL/uL — ABNORMAL LOW (ref 3.87–5.11)
RDW: 16.1 % — ABNORMAL HIGH (ref 11.5–15.5)
WBC Morphology: NORMAL
WBC: 1 10*3/uL — CL (ref 4.0–10.5)
nRBC: 0 % (ref 0.0–0.2)
nRBC: NONE SEEN /100 WBC

## 2021-03-31 LAB — COMPREHENSIVE METABOLIC PANEL
ALT: 38 U/L (ref 0–44)
AST: 23 U/L (ref 15–41)
Albumin: 2.6 g/dL — ABNORMAL LOW (ref 3.5–5.0)
Alkaline Phosphatase: 41 U/L (ref 38–126)
Anion gap: 9 (ref 5–15)
BUN: <5 mg/dL — ABNORMAL LOW (ref 8–23)
CO2: 32 mmol/L (ref 22–32)
Calcium: 8.5 mg/dL — ABNORMAL LOW (ref 8.9–10.3)
Chloride: 95 mmol/L — ABNORMAL LOW (ref 98–111)
Creatinine, Ser: 0.51 mg/dL (ref 0.44–1.00)
GFR, Estimated: >60 mL/min (ref >60)
Glucose, Bld: 122 mg/dL — ABNORMAL HIGH (ref 70–99)
Potassium: 3.1 mmol/L — ABNORMAL LOW (ref 3.5–5.1)
Sodium: 136 mmol/L (ref 135–145)
Total Bilirubin: 1.4 mg/dL — ABNORMAL HIGH (ref 0.3–1.2)
Total Protein: 5.8 g/dL — ABNORMAL LOW (ref 6.5–8.1)

## 2021-03-31 LAB — GLUCOSE, CAPILLARY
Glucose-Capillary: 119 mg/dL — ABNORMAL HIGH (ref 70–99)
Glucose-Capillary: 117 mg/dL — ABNORMAL HIGH (ref 70–99)
Glucose-Capillary: 150 mg/dL — ABNORMAL HIGH (ref 70–99)
Glucose-Capillary: 143 mg/dL — ABNORMAL HIGH (ref 70–99)

## 2021-03-31 LAB — PHOSPHORUS: Phosphorus: 2 mg/dL — ABNORMAL LOW (ref 2.5–4.6)

## 2021-03-31 LAB — MAGNESIUM: Magnesium: 1.4 mg/dL — ABNORMAL LOW (ref 1.7–2.4)

## 2021-03-31 MED ORDER — MAGNESIUM SULFATE 4 GM/100ML IV SOLN
4.0000 g | Freq: Once | INTRAVENOUS | Status: AC
  Administered 2021-03-31: 4 g via INTRAVENOUS
  Filled 2021-03-31: qty 100

## 2021-03-31 MED ORDER — POTASSIUM PHOSPHATES 15 MMOLE/5ML IV SOLN
20.0000 mmol | Freq: Once | INTRAVENOUS | Status: AC
  Administered 2021-03-31: 20 mmol via INTRAVENOUS
  Filled 2021-03-31 (×2): qty 6.67

## 2021-03-31 MED ORDER — ACETAMINOPHEN 325 MG PO TABS
650.0000 mg | ORAL_TABLET | Freq: Four times a day (QID) | ORAL | Status: DC | PRN
  Administered 2021-03-31 – 2021-04-19 (×9): 650 mg via ORAL
  Filled 2021-03-31 (×10): qty 2

## 2021-03-31 MED ORDER — ACETAMINOPHEN 650 MG RE SUPP: 650.0000 mg | RECTAL | Status: DC | PRN

## 2021-03-31 MED ORDER — POTASSIUM CHLORIDE 10 MEQ/100ML IV SOLN
10.0000 meq | INTRAVENOUS | Status: AC
  Administered 2021-03-31 (×3): 10 meq via INTRAVENOUS
  Filled 2021-03-31 (×2): qty 100

## 2021-03-31 NOTE — Progress Notes (Addendum)
Patient with temp of [place], Dr. Sharlet Salina made aware. Order for Tylenol put in and provided to patient.

## 2021-03-31 NOTE — Progress Notes (Signed)
Call from [facility], patient had a brief episode of SVT, Provider on call (M.Denny) notified.

## 2021-03-31 NOTE — Progress Notes (Signed)
PROGRESS NOTE    Yvette Roberts  VPX:[id number] DOB: [birth year] DOA: 03/27/2021 PCP: [name], FNP   Brief Narrative:  Patient is a 65 year old African-American female with a past medical history significant for but not limited to hypertension, type 2 diabetes mellitus, stage IV uterine cancer, history malignant pleural effusion diagnosed in February 2022 followed by Dr. Simeon Craft such currently on chemotherapy who had multiple symptoms most notably severe odynophagia the resultant minimal intake and profound weight loss.  She had been prescribed liquid medications for supportive care however unable to get them.  She reports that she lost about 50 to 60 pounds in last 3 months and that her main complaint is severe odynophagia with any attempt at swallowing.  She also admitted to having some mild abdominal discomfort which is somewhat chronic and unchanged.  She underwent further work-up and had an EGD and found nonsevere esophagitis in the proximal and distal esophagus as well as scattered mild inflammation with erythema in the entire stomach.  Medical oncology has been consulted as well and recommending trying Magic mouthwash with lidocaine prior to attempting to eat.  She is on the PPI and sucralfate but is reluctant swallow her own saliva because of the pain.  We cannot safely discharge the patient until she is able to tolerate opioid diet and able to swallow her pills.  The medical oncologist is reluctant to offer her peritoneal nutrition given the high risk of infection in the setting of severe pancytopenia.  We will order a calorie count to see how much that she is actually taking in.  Assessment & Plan:   Principal Problem:   Esophagitis Active Problems:   Type 2 diabetes mellitus without complication (HCC)   Hypertension   Uterine cancer (HCC)   Malignant pleural effusion   Protein-calorie malnutrition, severe (HCC)   Odynophagia  Odynophagia and Esophagitis GERD -GI was  consulted and underwent EGD on 03/29/21 which showed mild nonsevere esophagitis in the proximal and distal esophagus as well as scattered mild inflammation with erythema in the entire stomach but she has profound symptoms of Esophagitis  -Her Bx are pending -Her IV fluconazole has nos been discontinued but continuing Nystatin 500,000 units po 4x Daily  -Her Odynophagia could be related to Esophagitis and medical oncology encouraged her to try and swallow more now that her pain is better controlled; medical oncology recommends trying numbing medicine with Magic mouthwash and lidocaine before attempting to swallow given that the patient is somewhat reluctant to try swallowing -GI recommending continuing Pantoprazole BID and Carafate  1 gram po TIDwm and qHS as well as advancing diet to Soft and Slowly  Advance diet as tolerated  -Patient to follow up with GI in the outpatient setting  -We will order a calorie count given that she is still not taking much given her fear of swallowing and pain  Pancytopenia -Patient's WBC is now 1.0, Hgb/Hct is now 8.5/25.7, and Platelet Count is 69 and in the setting of her chemotherapy -Oncology recommending not needing any G-CSF support unless she has a fever -They are also recommending no blood transfusion unless her hemoglobin less than 8 and her platelet count is less than 10 or if she has active bleeding -Continue monitor and trend and repeat CBC in a.m.  Foul Smelling Urine -Check U/A and Urine Cx; urinalysis was relatively unremarkable showing moderate hemoglobin, 20 ketones, moderate leukocytes, no bacteria seen, 0-5 squamous epithelial cells, 0-5 WBCs but urine culture is growing 70,000 colony-forming units of  Pseudomonas aeruginosa -Her WBC is 1.0 but she is afebrile -Continue monitor and trend and follow-up on urine studies  Hypokalemia -Was replete and improved -Patient is potassium went from 2.7 is now 4.1 yesterday but is now back down to 3.1 -Replete  with IV KCl 30 mEq as well as IV K-Phos 20 mmol -Continue monitor and replete as necessary -Repeat CMP in a.m.  Hypomagnesemia -The patient magnesium level was extremely low at 1.4 -Replete with IV mag sulfate 4 g -Continue monitor and treat as necessary -Repeat magnesium level in a.m.  Hypophosphatemia -Patient's phosphorus level was 2.0 -Replete with IV K Phos 20 mmol -Continue to Monitor and Replete As necessary -Repeat Phos Level in the AM   Severe Protein Calorie Malnutrition -Nutritionist was consulted for further evaluation and recommendations however she has not really taken p.o. was yesterday -Estimated body mass index is 27.37 kg/m as calculated from the following:   Height as of 03/22/21: 5\' 6"  (1.676 m).   Weight as of 03/22/21: 76.9 kg.  -Her prealbumin was 16 -She refused a [place] tube feeding -She will need to stay in house until she is able to keep herself nutritionally replete either by p.o. or some other modality which is to be discussed  Metastatic Uterine Cancer -She underwent a chest CT of the abdomen pelvis which showed "Decreased loculated pleural fluid and pleural thickening in the chest follows diminishing perihepatic soft tissue and capsular hepatic implants as described.2. No adenopathy by size criteria in the abdomen or pelvis. Minimal soft tissue in the intra-aortocaval groove is all that remains on image 77 of series 2 in the retroperitoneum. 3. Decreased bulk of the uterus subjectively and of RIGHT adnexal structures as described. 4. New areas of bony sclerosis compatible with skeletal metastasis. No signs of increased FDG uptake and no visible sclerosis on the  previous PET. In the context of improvement of other disease it is possible that this represents treated metastasis. Would suggest attention on follow-up. 5. Loculated RIGHT-sided pleural fluid with signs of pleural thickening in the lower RIGHT chest otherwise with similar appearance. 6. Cholelithiasis  without evidence of acute cholecystitis. 7. Aortic atherosclerosis." -Her medical oncologist has reviewed the CT scans and feels that she has had good response to treatment and recommending continuing supportive care -Continue with pain control with Hydromorphone 1 mg IV q3 prn Severe Pain and Gabapentin 200 mg po BID -C/w Antiemetics and Supportive Care with Ondansetron 4 mg po/IV q6hprn Nausea   Malignant Pleural Effusion -CT Scan showed "Loculated RIGHT-sided pleural fluid with 6.5 x 3.6 cm axial dimension as compared to 8.1 x 3.9 cm. Basilar volume loss and signs of pleural thickening with decreased pleural thickening, markedly diminished since the prior PET and with continued decreased pleural thickening particularly in the inferior RIGHT chest even since the recent CT of the chest. Over the RIGHT hemidiaphragm pleural thickness approximately 7 mm as compared to 12 mm greatest thickness. LEFT chest is clear. Airways are patent." with the impression being "Decreased loculated pleural fluid and pleural thickening in the chest follows diminishing perihepatic soft tissue and capsular hepatic implants as described" -No Respiratory Distress   Hypertension -C/w Carvedilol 12.5 mg po BID  Diabetes mellitus Type 2 -Last hemoglobin A1c was 6.1 -Blood sugars ranging from 96-122 on daily BMPs/CMP's -CBGs ranging from 117-119 -continue to monitor blood sugars carefully and if necessary will add sensitive NovoLog sliding scale insulin AC  Anxiety and Depression -C/w Mirtazapine 50 mg p.o. nightly and Fluoxetine 20  mg p.o. daily  Hyperbilirubinemia -Likely reactive -Patient's T bili now 1.4 -Continue monitor and trend and repeat CMP in a.m.  DVT prophylaxis: Enoxaparin 40 mg sq q24h Code Status: FULL CODE Family Communication: No family present at bedside  Disposition Plan: Pending further improvement and tolerance of diet and clearance by medical oncology  Status is: Inpatient  Remains  inpatient appropriate because:Unsafe d/c plan, IV treatments appropriate due to intensity of illness or inability to take PO and Inpatient level of care appropriate due to severity of illness   Dispo: The patient is from: Home              Anticipated d/c is to: Home              Patient currently is not medically stable to d/c.   Difficult to place patient No  Consultants:   Medical Oncology   Gastroenterology    Procedures:  EGD Findings:      Non-severe esophagitis was found in the proximal and distal esophagus.       Biopsies were taken with a cold forceps for histology.      Scattered mild inflammation characterized by erythema was found in the       entire examined stomach. Biopsies were taken with a cold forceps for       histology.      The cardia and gastric fundus were normal on retroflexion.      The duodenal bulb, first portion of the duodenum and second portion of       the duodenum were normal. Impression:               - Non-severe esophagitis. Biopsied.                           - Gastritis. Biopsied.                           - Normal duodenal bulb, first portion of the                            duodenum and second portion of the duodenum.  Antimicrobials:  Anti-infectives (From admission, onward)   Start     Dose/Rate Route Frequency Ordered Stop   03/27/21 1800  fluconazole (DIFLUCAN) IVPB 200 mg  Status:  Discontinued        200 mg 100 mL/hr over 60 Minutes Intravenous Daily 03/27/21 1734 03/29/21 1633        Subjective: Seen and examined and still having discomfort in her esophagus and still not eating.  Still refusing to swallow her saliva and continues to spit up significantly.  Nursing states that she has not been eating very much.  No nausea or vomiting.  No other concerns or complaints this time.  Objective: Vitals:   03/30/21 1555 03/30/21 2117 03/31/21 0530 03/31/21 1323  BP: 121/77 116/70 123/79 131/78  Pulse: 75 76 80 79  Resp: 17 16 16  16   Temp: 99.4 F (37.4 C) 99 F (37.2 C) 99.5 F (37.5 C) 98.6 F (37 C)  TempSrc: Oral Oral Oral Oral  SpO2: 94% 98% 98% 97%    Intake/Output Summary (Last 24 hours) at 03/31/2021 1645 Last data filed at 03/31/2021 1627 Gross per 24 hour  Intake 360 ml  Output 400 ml  Net -40 ml   There were no vitals  filed for this visit.  Examination: Physical Exam:  Constitutional: Chronically ill-appearing older appearing than her stated age African-American female currently in no acute distress but continues to the be withdrawn and continues to continues to spit saliva into her emesis bag Eyes: Lids and conjunctivae normal, sclerae anicteric  ENMT: External Ears, Nose appear normal. Grossly normal hearing.  Neck: Appears normal, supple, no cervical masses, normal ROM, no appreciable thyromegaly; no JVD Respiratory: Diminished to auscultation bilaterally with coarse breath sounds worse on the right compared to left with slight crackles, no wheezing, rales, rhonchi. Normal respiratory effort and patient is not tachypenic. No accessory muscle use.  Not wearing supplemental oxygen via nasal cannula Cardiovascular: RRR, no murmurs / rubs / gallops. S1 and S2 auscultated. No extremity edema.  Abdomen: Soft, non-tender, non-distended. Bowel sounds positive.  GU: Deferred. Musculoskeletal: No clubbing / cyanosis of digits/nails. No joint deformity upper and lower extremities.  Skin: No rashes, lesions, ulcers on limited skin evaluation. No induration; Warm and dry.  Neurologic: CN 2-12 grossly intact with no focal deficits. Romberg sign and cerebellar reflexes not assessed.  Psychiatric: Normal judgment and insight. Alert and oriented x 3.  Withdrawn mood and appropriate affect.   Data Reviewed: I have personally reviewed following labs and imaging studies  CBC: Recent Labs  Lab 03/27/21 1755 03/28/21 0500 03/29/21 0502 03/30/21 0553 03/31/21 0518  WBC 2.6* 1.9* 1.5* 1.1* 1.0*  NEUTROABS  --    --   --   --  0.1*  HGB 9.4* 9.6* 8.8* 8.2* 8.5*  HCT 29.0* 29.8* 27.3* 25.5* 25.7*  MCV 86.1 86.4 86.1 86.4 86.0  PLT 108* 110* 93* 76* 69*   Basic Metabolic Panel: Recent Labs  Lab 03/27/21 1755 03/28/21 0500 03/28/21 0900 03/29/21 0502 03/29/21 2303 03/30/21 0553 03/31/21 0518  NA 135 137  --  135  --  135 136  K 2.2* 2.2*  --  2.7* 2.7* 4.1 3.1*  CL 92* 93*  --  94*  --  99 95*  CO2 34* 33*  --  31  --  28 32  GLUCOSE 114* 105*  --  96  --  101* 122*  BUN 11 10  --  7*  --  <5* <5*  CREATININE 0.43* 0.47  --  0.42*  --  0.44 0.51  CALCIUM 8.3* 8.5*  --  8.3*  --  8.2* 8.5*  MG  --   --  1.6* 1.6* 1.8  --  1.4*  PHOS  --   --   --  2.1*  --   --  2.0*   GFR: Estimated Creatinine Clearance: 73.4 mL/min (by C-G formula based on SCr of 0.51 mg/dL). Liver Function Tests: Recent Labs  Lab 03/27/21 1755 03/28/21 0500 03/31/21 0518  AST 57* 70* 23  ALT 57* 67* 38  ALKPHOS 42 46 41  BILITOT 1.1 1.0 1.4*  PROT 6.1* 6.2* 5.8*  ALBUMIN 3.0* 3.1* 2.6*   No results for input(s): LIPASE, AMYLASE in the last 168 hours. No results for input(s): AMMONIA in the last 168 hours. Coagulation Profile: No results for input(s): INR, PROTIME in the last 168 hours. Cardiac Enzymes: No results for input(s): CKTOTAL, CKMB, CKMBINDEX, TROPONINI in the last 168 hours. BNP (last 3 results) No results for input(s): PROBNP in the last 8760 hours. HbA1C: No results for input(s): HGBA1C in the last 72 hours. CBG: Recent Labs  Lab 03/31/21 0736 03/31/21 1156  GLUCAP 119* 117*   Lipid Profile: No results  for input(s): CHOL, HDL, LDLCALC, TRIG, CHOLHDL, LDLDIRECT in the last 72 hours. Thyroid Function Tests: No results for input(s): TSH, T4TOTAL, FREET4, T3FREE, THYROIDAB in the last 72 hours. Anemia Panel: No results for input(s): VITAMINB12, FOLATE, FERRITIN, TIBC, IRON, RETICCTPCT in the last 72 hours. Sepsis Labs: No results for input(s): PROCALCITON, LATICACIDVEN in the last 168  hours.  Recent Results (from the past 240 hour(s))  SARS CORONAVIRUS 2 (TAT 6-24 HRS) Nasopharyngeal Nasopharyngeal Swab     Status: None   Collection Time: 03/28/21  5:20 AM   Specimen: Nasopharyngeal Swab  Result Value Ref Range Status   SARS Coronavirus 2 NEGATIVE NEGATIVE Final    Comment: (NOTE) SARS-CoV-2 target nucleic acids are NOT DETECTED.  The SARS-CoV-2 RNA is generally detectable in upper and lower respiratory specimens during the acute phase of infection. Negative results do not preclude SARS-CoV-2 infection, do not rule out co-infections with other pathogens, and should not be used as the sole basis for treatment or other patient management decisions. Negative results must be combined with clinical observations, patient history, and epidemiological information. The expected result is Negative.  Fact Sheet for Patients: SugarRoll.be  Fact Sheet for Healthcare Providers: https://www.woods-mathews.com/  This test is not yet approved or cleared by the [place] FDA and  has been authorized for detection and/or diagnosis of SARS-CoV-2 by FDA under an Emergency Use Authorization (EUA). This EUA will remain  in effect (meaning this test can be used) for the duration of the COVID-19 declaration under Se ction 564(b)(1) of the Act, 21 U.S.C. section 360bbb-3(b)(1), unless the authorization is terminated or revoked sooner.  Performed at [facility], [place] [address], [place], [place] [address]   Culture, Urine     Status: Abnormal (Preliminary result)   Collection Time: 03/30/21 11:55 AM   Specimen: Urine, Clean Catch  Result Value Ref Range Status   Specimen Description   Final    URINE, CLEAN CATCH Performed at [facility], [place] [address], [place], [place] [address]    Special Requests   Final    NONE Performed at [facility], [place] [address] [place], [place] [address]     Culture (A)  Final    70,000 COLONIES/mL PSEUDOMONAS AERUGINOSA SUSCEPTIBILITIES TO FOLLOW Performed at [facility], [place] [address] [place], [place] [address]    Report Status PENDING  Incomplete    RN Pressure Injury Documentation:     Estimated body mass index is 27.37 kg/m as calculated from the following:   Height as of 03/22/21: 5\' 6"  (1.676 m).   Weight as of 03/22/21: 76.9 kg.  Malnutrition Type:   Malnutrition Characteristics:   Nutrition Interventions:    Radiology Studies: DG CHEST PORT 1 VIEW  Result Date: 03/31/2021 CLINICAL DATA:  Shortness of breath.  History of ovarian carcinoma EXAM: PORTABLE CHEST 1 VIEW COMPARISON:  Chest radiograph Mar 27, 2021; chest CT March 28, 2021 FINDINGS: Apparent loculated pleural effusion on the right remains. There is also an apparent free-flowing small right pleural effusion, not appreciably changed from recent CT. Atelectasis right base. Left lung clear. Heart is upper normal in size with pulmonary vascularity normal. Port-A-Cath tip is in the right atrium slightly beyond the superior vena cava-right atrium junction. No adenopathy appreciable. Degenerative change in each shoulder noted. No pneumothorax. IMPRESSION: Pleural effusion on the right, primarily loculated, essentially stable compared to recent CT examination. Right base atelectasis noted. Left lung clear. No new opacity evident. Stable  cardiac silhouette. Port-A-Cath present on the right. Electronically Signed   By: Lowella Grip III M.D.   On: 03/31/2021 08:12    Scheduled Meds: . carvedilol  12.5 mg Oral BID WC  . chlorhexidine  15 mL Mouth Rinse BID  . Chlorhexidine Gluconate Cloth  6 each Topical Daily  . enoxaparin (LOVENOX) injection  40 mg Subcutaneous Q24H  . FLUoxetine  20 mg Oral Daily  . gabapentin  200 mg Oral BID  . magic mouthwash w/lidocaine  2 mL Oral TID  . mouth rinse  15 mL Mouth Rinse q12n4p  . melatonin  10 mg Oral QHS  . mirtazapine  15 mg  Oral QHS  . nystatin  5 mL Oral QID  . pantoprazole sodium  40 mg Oral BID  . polyethylene glycol  17 g Oral Daily  . sucralfate  1 g Oral TID WC & HS   Continuous Infusions: . potassium chloride 10 mEq (03/31/21 1612)  . potassium PHOSPHATE IVPB (in mmol)      LOS: 4 days   Kerney Elbe, DO [facility] PAGER is on [place]  If 7PM-7AM, please contact night-coverage [email or web address]

## 2021-04-01 ENCOUNTER — Inpatient Hospital Stay (HOSPITAL_COMMUNITY): Payer: Medicare Other

## 2021-04-01 DIAGNOSIS — D709 Neutropenia, unspecified: Secondary | ICD-10-CM

## 2021-04-01 DIAGNOSIS — N390 Urinary tract infection, site not specified: Secondary | ICD-10-CM

## 2021-04-01 DIAGNOSIS — R5081 Fever presenting with conditions classified elsewhere: Secondary | ICD-10-CM

## 2021-04-01 DIAGNOSIS — B965 Pseudomonas (aeruginosa) (mallei) (pseudomallei) as the cause of diseases classified elsewhere: Secondary | ICD-10-CM

## 2021-04-01 LAB — CBC WITH DIFFERENTIAL/PLATELET
Band Neutrophils: 2 %
Basophils Relative: 0 %
Blasts: NONE SEEN %
Eosinophils Relative: 0 %
HCT: 25.3 % — ABNORMAL LOW (ref 36.0–46.0)
Hemoglobin: 8.2 g/dL — ABNORMAL LOW (ref 12.0–15.0)
Lymphocytes Relative: 68 %
MCH: 27.8 pg (ref 26.0–34.0)
MCHC: 32.4 g/dL (ref 30.0–36.0)
MCV: 85.8 fL (ref 80.0–100.0)
Metamyelocytes Relative: 2 %
Monocytes Relative: 22 %
Myelocytes: NONE SEEN %
Neutrophils Relative %: 6 %
Platelets: 67 10*3/uL — ABNORMAL LOW (ref 150–400)
Promyelocytes Relative: NONE SEEN %
RBC Morphology: NORMAL
RBC: 2.95 MIL/uL — ABNORMAL LOW (ref 3.87–5.11)
RDW: 16.3 % — ABNORMAL HIGH (ref 11.5–15.5)
WBC Morphology: NORMAL
WBC: 0.8 10*3/uL — CL (ref 4.0–10.5)
nRBC: 0 % (ref 0.0–0.2)
nRBC: 10 /100 WBC — ABNORMAL HIGH

## 2021-04-01 LAB — COMPREHENSIVE METABOLIC PANEL
ALT: 33 U/L (ref 0–44)
AST: 22 U/L (ref 15–41)
Albumin: 2.6 g/dL — ABNORMAL LOW (ref 3.5–5.0)
Alkaline Phosphatase: 43 U/L (ref 38–126)
Anion gap: 10 (ref 5–15)
BUN: 6 mg/dL — ABNORMAL LOW (ref 8–23)
CO2: 30 mmol/L (ref 22–32)
Calcium: 8.4 mg/dL — ABNORMAL LOW (ref 8.9–10.3)
Chloride: 94 mmol/L — ABNORMAL LOW (ref 98–111)
Creatinine, Ser: 0.51 mg/dL (ref 0.44–1.00)
GFR, Estimated: >60 mL/min (ref >60)
Glucose, Bld: 130 mg/dL — ABNORMAL HIGH (ref 70–99)
Potassium: 3.2 mmol/L — ABNORMAL LOW (ref 3.5–5.1)
Sodium: 134 mmol/L — ABNORMAL LOW (ref 135–145)
Total Bilirubin: 1.4 mg/dL — ABNORMAL HIGH (ref 0.3–1.2)
Total Protein: 5.9 g/dL — ABNORMAL LOW (ref 6.5–8.1)

## 2021-04-01 LAB — URINE CULTURE: Culture: 70000 — AB

## 2021-04-01 LAB — URINALYSIS, ROUTINE W REFLEX MICROSCOPIC
Bilirubin Urine: NEGATIVE
Glucose, UA: NEGATIVE mg/dL
Hgb urine dipstick: NEGATIVE
Ketones, ur: 5 mg/dL — AB
Leukocytes,Ua: NEGATIVE
Nitrite: NEGATIVE
Protein, ur: 30 mg/dL — AB
Specific Gravity, Urine: 1.016 (ref 1.005–1.030)
pH: 6 (ref 5.0–8.0)

## 2021-04-01 LAB — GLUCOSE, CAPILLARY
Glucose-Capillary: 132 mg/dL — ABNORMAL HIGH (ref 70–99)
Glucose-Capillary: 145 mg/dL — ABNORMAL HIGH (ref 70–99)
Glucose-Capillary: 123 mg/dL — ABNORMAL HIGH (ref 70–99)
Glucose-Capillary: 110 mg/dL — ABNORMAL HIGH (ref 70–99)

## 2021-04-01 LAB — PHOSPHORUS: Phosphorus: 2.8 mg/dL (ref 2.5–4.6)

## 2021-04-01 LAB — MAGNESIUM: Magnesium: 1.7 mg/dL (ref 1.7–2.4)

## 2021-04-01 MED ORDER — POTASSIUM CHLORIDE 10 MEQ/100ML IV SOLN
10.0000 meq | INTRAVENOUS | Status: AC
  Administered 2021-04-01 (×3): 10 meq via INTRAVENOUS
  Filled 2021-04-01: qty 100

## 2021-04-01 MED ORDER — TBO-FILGRASTIM 480 MCG/0.8ML ~~LOC~~ SOSY
480.0000 ug | PREFILLED_SYRINGE | Freq: Every day | SUBCUTANEOUS | Status: DC
  Administered 2021-04-01 – 2021-04-02 (×2): 480 ug via SUBCUTANEOUS
  Filled 2021-04-01 (×2): qty 0.8

## 2021-04-01 MED ORDER — OXYCODONE HCL 5 MG PO TABS
5.0000 mg | ORAL_TABLET | Freq: Four times a day (QID) | ORAL | Status: DC | PRN
  Administered 2021-04-01: 5 mg via ORAL
  Filled 2021-04-01 (×2): qty 1

## 2021-04-01 MED ORDER — SODIUM CHLORIDE 0.9 % IV BOLUS
1000.0000 mL | Freq: Once | INTRAVENOUS | Status: AC
  Administered 2021-04-01: 1000 mL via INTRAVENOUS

## 2021-04-01 MED ORDER — MAGNESIUM SULFATE 2 GM/50ML IV SOLN
2.0000 g | Freq: Once | INTRAVENOUS | Status: AC
  Administered 2021-04-01: 2 g via INTRAVENOUS
  Filled 2021-04-01: qty 50

## 2021-04-01 MED ORDER — POTASSIUM CHLORIDE 10 MEQ/100ML IV SOLN
10.0000 meq | INTRAVENOUS | Status: AC
  Administered 2021-04-01: 10 meq via INTRAVENOUS
  Filled 2021-04-01 (×2): qty 100

## 2021-04-01 MED ORDER — SODIUM CHLORIDE 0.9 % IV SOLN
2.0000 g | Freq: Three times a day (TID) | INTRAVENOUS | Status: DC
  Administered 2021-04-01 – 2021-04-07 (×19): 2 g via INTRAVENOUS
  Filled 2021-04-01 (×20): qty 2

## 2021-04-01 NOTE — Progress Notes (Signed)
Pharmacy Antibiotic Note  Yvette Roberts is a 65 y.o. female admitted on 03/27/2021 with febrile neutropenia.  Pharmacy has been consulted for cefepime dosing.  Plan: Cefepime 2 g iv q 8 hours  Will f/u renal function, culture results, and clinical course     Temp (24hrs), Avg:100.2 F (37.9 C), Min:98.6 F (37 C), Max:101 F (38.3 C)  Recent Labs  Lab 03/28/21 0500 03/29/21 0502 03/30/21 0553 03/31/21 0518 04/01/21 0459  WBC 1.9* 1.5* 1.1* 1.0* 0.8*  CREATININE 0.47 0.42* 0.44 0.51 0.51    Estimated Creatinine Clearance: 73.4 mL/min (by C-G formula based on SCr of 0.51 mg/dL).    Allergies  Allergen Reactions  . Eggs Or Egg-Derived Products Hives  . Influenza Vaccines Hives  . Peanut-Containing Drug Products Hives  . Penicillins Hives  . Sulfa Antibiotics Hives  . Codeine Rash    Antimicrobials this admission: 6/5 cefepime >>   Dose adjustments this admission:  Microbiology results: BCx:  6/3 UCx: 70 k Pseudomonas aeruginosa    Thank you for allowing pharmacy to be a part of this patient's care.  Napoleon Form 04/01/2021 8:39 AM

## 2021-04-01 NOTE — Progress Notes (Signed)
PROGRESS NOTE    Yvette Roberts  ZDG:[id number] DOB: 03-12-1956 DOA: 03/27/2021 PCP: [name], FNP   Brief Narrative:  Patient is a 65 year old African-American female with a past medical history significant for but not limited to hypertension, type 2 diabetes mellitus, stage IV uterine cancer, history malignant pleural effusion diagnosed in February 2022 followed by Dr. Simeon Craft such currently on chemotherapy who had multiple symptoms most notably severe odynophagia the resultant minimal intake and profound weight loss.  She had been prescribed liquid medications for supportive care however unable to get them.  She reports that she lost about 50 to 60 pounds in last 3 months and that her main complaint is severe odynophagia with any attempt at swallowing.  She also admitted to having some mild abdominal discomfort which is somewhat chronic and unchanged.  She underwent further work-up and had an EGD and found nonsevere esophagitis in the proximal and distal esophagus as well as scattered mild inflammation with erythema in the entire stomach.  Medical oncology has been consulted as well and recommending trying Magic mouthwash with lidocaine prior to attempting to eat.  She is on the PPI and sucralfate but is reluctant swallow her own saliva because of the pain.  We cannot safely discharge the patient until she is able to tolerate opioid diet and able to swallow her pills.  The medical oncologist is reluctant to offer her peritoneal nutrition given the high risk of infection in the setting of severe pancytopenia.  We will order a calorie count to see how much that she is actually taking in.  Overnight she developed a fever and now she is febrile neutropenic so we will panculture her and start IV cefepime.  Her urine cultured last time to grow out 70,000 colony-forming units of Pseudomonas aeruginosa will cover with IV cefepime and repeat a urinalysis and urine culture.  Patient continues to have some  burning and discomfort in her urine so likely this is the source of her infection  Assessment & Plan:   Principal Problem:   Esophagitis Active Problems:   Type 2 diabetes mellitus without complication (HCC)   Hypertension   Uterine cancer (HCC)   Malignant pleural effusion   Protein-calorie malnutrition, severe (HCC)   Odynophagia  Odynophagia and Esophagitis GERD -GI was consulted and underwent EGD on 03/29/21 which showed mild nonsevere esophagitis in the proximal and distal esophagus as well as scattered mild inflammation with erythema in the entire stomach but she has profound symptoms of Esophagitis  -Her Bx are pending -Her IV fluconazole has nos been discontinued but continuing Nystatin 500,000 units po 4x Daily  -Her Odynophagia could be related to Esophagitis and medical oncology encouraged her to try and swallow more now that her pain is better controlled; medical oncology recommends trying numbing medicine with Magic mouthwash and lidocaine before attempting to swallow given that the patient is somewhat reluctant to try swallowing -GI recommending continuing Pantoprazole BID and Carafate  1 gram po TIDwm and qHS as well as advancing diet to Soft and Slowly  Advance diet as tolerated  -Patient to follow up with GI in the outpatient setting  -We will order a calorie count given that she is still not taking much given her fear of swallowing and pain; overnight the nursing states that she is eating a little bit more and tolerating her food  Febrile Neutropenia -Patient had a T-max of 101 and she is neutropenic with a WBC of 0.8; unfortunately no ANC was obtained today  but yesterday was 100 -We will start the patient on IV cefepime and panculture and obtain blood cultures x2, urinalysis and urine culture again as well as a chest x-ray -May need to defer to oncology to start Granix or other  G-CSF -Follow cultures carefully and have oncology weigh in again  Pancytopenia -Patient's  WBC is now 0.8, Hgb/Hct is now 8.2/25.3 and Platelet Count is 67 and in the setting of her chemotherapy -Oncology recommending not needing any G-CSF support unless she has a fever and now that she has we will start per their Recc's  -They are also recommending no blood transfusion unless her hemoglobin less than 8 and her platelet count is less than 10 or if she has active bleeding -Continue monitor and trend and repeat CBC in a.m.  Foul Smelling Urine with concern for UTI -Check U/A and Urine Cx; initial urinalysis was relatively unremarkable showing moderate hemoglobin, 20 ketones, moderate leukocytes, no bacteria seen, 0-5 squamous epithelial cells, 0-5 WBCs but urine culture is growing 70,000 colony-forming units of Pseudomonas aeruginosa that was pansensitive -Her WBC was 1.0 but she was afebrile yesterday however spiked a temperature last evening of 101 and WBC has dropped to 0.8 -We will repeat urinalysis and urine culture and empirically start treatment with IV cefepime -Repeat urinalysis showed a clear appearance with amber color urine, negative glucose, 5 ketones, negative nitrites, negative leukocytes, rare bacteria, 0-5 squamous epithelial cells, 0-5 RBCs per high-power field, 0-5 WBCs  Hypokalemia -Was replete and improved but dropped again -Patient is potassium went from 2.7 is now 4.1 yesterday but is now back down to 3.2 -Replete with IV KCl 30 mEq and IV KCl 40 mEQ -Continue monitor and replete as necessary -Repeat CMP in a.m.  Hypomagnesemia -The patient magnesium level was extremely low at 1.4 and improved to 1.7 -Replete with IV mag sulfate 2 g -Continue monitor and treat as necessary -Repeat magnesium level in a.m.  Hypophosphatemia -Patient's phosphorus level was 2.0 and improved to 2.8 -Replete with IV K Phos 20 mmol yesterday  -Continue to Monitor and Replete As necessary -Repeat Phos Level in the AM   Severe Protein Calorie Malnutrition -Nutritionist was  consulted for further evaluation and recommendations however she has not really taken p.o. was yesterday -Estimated body mass index is 27.37 kg/m as calculated from the following:   Height as of 03/22/21: 5\' 6"  (1.676 m).   Weight as of 03/22/21: 76.9 kg.  -Her prealbumin was 16 -She refused a [place] tube feeding -She will need to stay in house until she is able to keep herself nutritionally replete either by p.o. or some other modality which is to be discussed -Per Nurse her po Intake is improving some what and she at her [place] last night and ordered breakfast  Metastatic Uterine Cancer -She underwent a chest CT of the abdomen pelvis which showed "Decreased loculated pleural fluid and pleural thickening in the chest follows diminishing perihepatic soft tissue and capsular hepatic implants as described.2. No adenopathy by size criteria in the abdomen or pelvis. Minimal soft tissue in the intra-aortocaval groove is all that remains on image 77 of series 2 in the retroperitoneum. 3. Decreased bulk of the uterus subjectively and of RIGHT adnexal structures as described. 4. New areas of bony sclerosis compatible with skeletal metastasis. No signs of increased FDG uptake and no visible sclerosis on the  previous PET. In the context of improvement of other disease it is possible that this represents treated metastasis. Would suggest  attention on follow-up. 5. Loculated RIGHT-sided pleural fluid with signs of pleural thickening in the lower RIGHT chest otherwise with similar appearance. 6. Cholelithiasis without evidence of acute cholecystitis. 7. Aortic atherosclerosis." -Her medical oncologist has reviewed the CT scans and feels that she has had good response to treatment and recommending continuing supportive care -Continue with pain control with Hydromorphone 1 mg IV q3 prn Severe Pain and Gabapentin 200 mg po BID -C/w Antiemetics and Supportive Care with Ondansetron 4 mg po/IV q6hprn Nausea   Malignant  Pleural Effusion -CT Scan showed "Loculated RIGHT-sided pleural fluid with 6.5 x 3.6 cm axial dimension as compared to 8.1 x 3.9 cm. Basilar volume loss and signs of pleural thickening with decreased pleural thickening, markedly diminished since the prior PET and with continued decreased pleural thickening particularly in the inferior RIGHT chest even since the recent CT of the chest. Over the RIGHT hemidiaphragm pleural thickness approximately 7 mm as compared to 12 mm greatest thickness. LEFT chest is clear. Airways are patent." with the impression being "Decreased loculated pleural fluid and pleural thickening in the chest follows diminishing perihepatic soft tissue and capsular hepatic implants as described" -No Respiratory Distress   Hypertension -C/w Carvedilol 12.5 mg po BID -Continue to Monitor BP per Protocol  -Last BP reading was 91/58 so will bolus 1 Liter   Diabetes mellitus Type 2 -Last hemoglobin A1c was 6.1 -Blood sugars ranging from 96-122 on daily BMPs/CMP's -CBGs ranging from 132-145 -continue to monitor blood sugars carefully and if necessary will add sensitive NovoLog sliding scale insulin AC  Anxiety and Depression -C/w Mirtazapine 50 mg p.o. nightly and Fluoxetine 20 mg p.o. daily  Hyperbilirubinemia -Likely reactive -Patient's T bili now 1.4 again  -Continue monitor and trend and repeat CMP in a.m.  DVT prophylaxis: Enoxaparin 40 mg sq q24h Code Status: FULL CODE Family Communication: No family present at bedside  Disposition Plan: Pending further improvement and tolerance of diet and clearance by medical oncology  Status is: Inpatient  Remains inpatient appropriate because:Unsafe d/c plan, IV treatments appropriate due to intensity of illness or inability to take PO and Inpatient level of care appropriate due to severity of illness   Dispo: The patient is from: Home              Anticipated d/c is to: Home              Patient currently is not medically  stable to d/c.   Difficult to place patient No  Consultants:   Medical Oncology   Gastroenterology    Procedures:  EGD Findings:      Non-severe esophagitis was found in the proximal and distal esophagus.       Biopsies were taken with a cold forceps for histology.      Scattered mild inflammation characterized by erythema was found in the       entire examined stomach. Biopsies were taken with a cold forceps for       histology.      The cardia and gastric fundus were normal on retroflexion.      The duodenal bulb, first portion of the duodenum and second portion of       the duodenum were normal. Impression:               - Non-severe esophagitis. Biopsied.                           -  Gastritis. Biopsied.                           - Normal duodenal bulb, first portion of the                            duodenum and second portion of the duodenum.  Antimicrobials:  Anti-infectives (From admission, onward)   Start     Dose/Rate Route Frequency Ordered Stop   04/01/21 0930  ceFEPIme (MAXIPIME) 2 g in sodium chloride 0.9 % 100 mL IVPB        2 g 200 mL/hr over 30 Minutes Intravenous Every 8 hours 04/01/21 0839     03/27/21 1800  fluconazole (DIFLUCAN) IVPB 200 mg  Status:  Discontinued        200 mg 100 mL/hr over 60 Minutes Intravenous Daily 03/27/21 1734 03/29/21 1633        Subjective: Seen and examined at bedside and had fever last night and this morning.  Still complaining of burning and discomfort in her urine.  No nausea or vomiting.  Thinks her appetite is doing little bit better and was able to tolerate her dinner last night and ordered food this morning.  No other concerns or complaints at this time he denies any chest pain or shortness of breath.  Objective: Vitals:   03/31/21 2012 04/01/21 0608 04/01/21 1051 04/01/21 1348  BP: (!) 107/56 107/67 118/69 (!) 91/58  Pulse: 82 88 84 75  Resp: 16 18 20 20   Temp: (!) 101 F (38.3 C) (!) 101 F (38.3 C) (!) 101.7 F  (38.7 C) 98.6 F (37 C)  TempSrc: Oral Oral Oral Oral  SpO2: 98% 99% 96% 94%    Intake/Output Summary (Last 24 hours) at 04/01/2021 1538 Last data filed at 04/01/2021 1100 Gross per 24 hour  Intake 507 ml  Output 400 ml  Net 107 ml   There were no vitals filed for this visit.  Examination: Physical Exam:  Constitutional: Chronically ill-appearing older appearing than her stated age African-American female currently in no acute distress appears calm and not as withdrawn and is not spitting up in her emesis bag today. Eyes: Lids and conjunctivae normal, sclerae anicteric  ENMT: External Ears, Nose appear normal. Grossly normal hearing.  Neck: Appears normal, supple, no cervical masses, normal ROM, no appreciable thyromegaly; no JVD Respiratory: Diminished to auscultation bilaterally with coarse breath sounds, no wheezing, rales, rhonchi or crackles. Normal respiratory effort and patient is not tachypenic. No accessory muscle use.  Not wearing supplemental oxygen via nasal cannula Cardiovascular: RRR, no murmurs / rubs / gallops. S1 and S2 auscultated.  Mild lower extremity edema Abdomen: Soft, non-tender, non-distended.  Bowel sounds positive.  GU: Deferred. Musculoskeletal: No clubbing / cyanosis of digits/nails. No joint deformity upper and lower extremities.   Skin: No rashes, lesions, ulcers on limited skin evaluation. No induration; Warm and dry.  Neurologic: CN 2-12 grossly intact with no focal deficits. Romberg sign and cerebellar reflexes not assessed.  Psychiatric: Normal judgment and insight. Alert and oriented x 3. Normal mood and appropriate affect.    Data Reviewed: I have personally reviewed following labs and imaging studies  CBC: Recent Labs  Lab 03/28/21 0500 03/29/21 0502 03/30/21 0553 03/31/21 0518 04/01/21 0459  WBC 1.9* 1.5* 1.1* 1.0* 0.8*  NEUTROABS  --   --   --  0.1*  --   HGB 9.6* 8.8*  8.2* 8.5* 8.2*  HCT 29.8* 27.3* 25.5* 25.7* 25.3*  MCV 86.4 86.1  86.4 86.0 85.8  PLT 110* 93* 76* 69* 67*   Basic Metabolic Panel: Recent Labs  Lab 03/28/21 0500 03/28/21 0900 03/29/21 0502 03/29/21 2303 03/30/21 0553 03/31/21 0518 04/01/21 0459  NA 137  --  135  --  135 136 134*  K 2.2*  --  2.7* 2.7* 4.1 3.1* 3.2*  CL 93*  --  94*  --  99 95* 94*  CO2 33*  --  31  --  28 32 30  GLUCOSE 105*  --  96  --  101* 122* 130*  BUN 10  --  7*  --  <5* <5* 6*  CREATININE 0.47  --  0.42*  --  0.44 0.51 0.51  CALCIUM 8.5*  --  8.3*  --  8.2* 8.5* 8.4*  MG  --  1.6* 1.6* 1.8  --  1.4* 1.7  PHOS  --   --  2.1*  --   --  2.0* 2.8   GFR: Estimated Creatinine Clearance: 73.4 mL/min (by C-G formula based on SCr of 0.51 mg/dL). Liver Function Tests: Recent Labs  Lab 03/27/21 1755 03/28/21 0500 03/31/21 0518 04/01/21 0459  AST 57* 70* 23 22  ALT 57* 67* 38 33  ALKPHOS 42 46 41 43  BILITOT 1.1 1.0 1.4* 1.4*  PROT 6.1* 6.2* 5.8* 5.9*  ALBUMIN 3.0* 3.1* 2.6* 2.6*   No results for input(s): LIPASE, AMYLASE in the last 168 hours. No results for input(s): AMMONIA in the last 168 hours. Coagulation Profile: No results for input(s): INR, PROTIME in the last 168 hours. Cardiac Enzymes: No results for input(s): CKTOTAL, CKMB, CKMBINDEX, TROPONINI in the last 168 hours. BNP (last 3 results) No results for input(s): PROBNP in the last 8760 hours. HbA1C: No results for input(s): HGBA1C in the last 72 hours. CBG: Recent Labs  Lab 03/31/21 1156 03/31/21 1542 03/31/21 2218 04/01/21 0720 04/01/21 1237  GLUCAP 117* 150* 143* 132* 145*   Lipid Profile: No results for input(s): CHOL, HDL, LDLCALC, TRIG, CHOLHDL, LDLDIRECT in the last 72 hours. Thyroid Function Tests: No results for input(s): TSH, T4TOTAL, FREET4, T3FREE, THYROIDAB in the last 72 hours. Anemia Panel: No results for input(s): VITAMINB12, FOLATE, FERRITIN, TIBC, IRON, RETICCTPCT in the last 72 hours. Sepsis Labs: No results for input(s): PROCALCITON, LATICACIDVEN in the last 168  hours.  Recent Results (from the past 240 hour(s))  SARS CORONAVIRUS 2 (TAT 6-24 HRS) Nasopharyngeal Nasopharyngeal Swab     Status: None   Collection Time: 03/28/21  5:20 AM   Specimen: Nasopharyngeal Swab  Result Value Ref Range Status   SARS Coronavirus 2 NEGATIVE NEGATIVE Final    Comment: (NOTE) SARS-CoV-2 target nucleic acids are NOT DETECTED.  The SARS-CoV-2 RNA is generally detectable in upper and lower respiratory specimens during the acute phase of infection. Negative results do not preclude SARS-CoV-2 infection, do not rule out co-infections with other pathogens, and should not be used as the sole basis for treatment or other patient management decisions. Negative results must be combined with clinical observations, patient history, and epidemiological information. The expected result is Negative.  Fact Sheet for Patients: SugarRoll.be  Fact Sheet for Healthcare Providers: https://www.woods-mathews.com/  This test is not yet approved or cleared by the [place] FDA and  has been authorized for detection and/or diagnosis of SARS-CoV-2 by FDA under an Emergency Use Authorization (EUA). This EUA will remain  in effect (meaning this test  can be used) for the duration of the COVID-19 declaration under Se ction 564(b)(1) of the Act, 21 U.S.C. section 360bbb-3(b)(1), unless the authorization is terminated or revoked sooner.  Performed at [facility], [place] [address] [place], [place] [address]   Culture, Urine     Status: Abnormal   Collection Time: 03/30/21 11:55 AM   Specimen: Urine, Clean Catch  Result Value Ref Range Status   Specimen Description   Final    URINE, CLEAN CATCH Performed at [facility], [place] [address] [place],  [address]    Special Requests   Final    NONE Performed at [facility], [place] [address] [place], [place] [address]    Culture 70,000  COLONIES/mL PSEUDOMONAS AERUGINOSA (A)  Final   Report Status 04/01/2021 FINAL  Final   Organism ID, Bacteria PSEUDOMONAS AERUGINOSA (A)  Final      Susceptibility   Pseudomonas aeruginosa - MIC*    CEFTAZIDIME 2 SENSITIVE Sensitive     CIPROFLOXACIN <=0.25 SENSITIVE Sensitive     GENTAMICIN 2 SENSITIVE Sensitive     IMIPENEM 2 SENSITIVE Sensitive     PIP/TAZO <=4 SENSITIVE Sensitive     CEFEPIME 2 SENSITIVE Sensitive     * 70,000 COLONIES/mL PSEUDOMONAS AERUGINOSA    RN Pressure Injury Documentation:     Estimated body mass index is 27.37 kg/m as calculated from the following:   Height as of 03/22/21: 5\' 6"  (1.676 m).   Weight as of 03/22/21: 76.9 kg.  Malnutrition Type:   Malnutrition Characteristics:   Nutrition Interventions:    Radiology Studies: DG CHEST PORT 1 VIEW  Result Date: 04/01/2021 CLINICAL DATA:  Neutropenic fever EXAM: PORTABLE CHEST 1 VIEW COMPARISON:  March 31, 2021 FINDINGS: Stable small right-sided loculated pleural effusion. Hazy opacification of the right hemithorax with fluid in the minor fissure likely representing a small free-flowing pleural effusion. Atelectasis in the right lung base. Left lung is clear. Right chest wall Port-A-Cath with tip overlying the right atrium. The heart size and mediastinal contours are unchanged. Thoracic spondylosis with degenerative changes shoulders. IMPRESSION: Stable right-sided pleural fluid with adjacent atelectasis. Left lung is clear. Electronically Signed   By: Dahlia Bailiff MD   On: 04/01/2021 11:37   DG CHEST PORT 1 VIEW  Result Date: 03/31/2021 CLINICAL DATA:  Shortness of breath.  History of ovarian carcinoma EXAM: PORTABLE CHEST 1 VIEW COMPARISON:  Chest radiograph Mar 27, 2021; chest CT March 28, 2021 FINDINGS: Apparent loculated pleural effusion on the right remains. There is also an apparent free-flowing small right pleural effusion, not appreciably changed from recent CT. Atelectasis right base. Left lung clear.  Heart is upper normal in size with pulmonary vascularity normal. Port-A-Cath tip is in the right atrium slightly beyond the superior vena cava-right atrium junction. No adenopathy appreciable. Degenerative change in each shoulder noted. No pneumothorax. IMPRESSION: Pleural effusion on the right, primarily loculated, essentially stable compared to recent CT examination. Right base atelectasis noted. Left lung clear. No new opacity evident. Stable cardiac silhouette. Port-A-Cath present on the right. Electronically Signed   By: Lowella Grip III M.D.   On: 03/31/2021 08:12    Scheduled Meds: . carvedilol  12.5 mg Oral BID WC  . chlorhexidine  15 mL Mouth Rinse BID  . Chlorhexidine Gluconate Cloth  6 each Topical Daily  . enoxaparin (LOVENOX) injection  40 mg Subcutaneous Q24H  . FLUoxetine  20 mg Oral Daily  . gabapentin  200 mg  Oral BID  . magic mouthwash w/lidocaine  2 mL Oral TID  . mouth rinse  15 mL Mouth Rinse q12n4p  . melatonin  10 mg Oral QHS  . mirtazapine  15 mg Oral QHS  . nystatin  5 mL Oral QID  . pantoprazole sodium  40 mg Oral BID  . polyethylene glycol  17 g Oral Daily  . sucralfate  1 g Oral TID WC & HS   Continuous Infusions: . ceFEPime (MAXIPIME) IV 2 g (04/01/21 1057)    LOS: 5 days   Kerney Elbe, DO [facility] PAGER is on [place]  If 7PM-7AM, please contact night-coverage [email or web address]

## 2021-04-01 NOTE — Progress Notes (Signed)
   04/01/21 1051  Assess: MEWS Score  Temp (!) 101.7 F (38.7 C)  BP 118/69  Pulse Rate 84  Resp 20  SpO2 96 %  Assess: MEWS Score  MEWS Temp 2  MEWS Systolic 0  MEWS Pulse 0  MEWS RR 0  MEWS LOC 0  MEWS Score 2  MEWS Score Color Yellow  Assess: if the MEWS score is Yellow or Red  Were vital signs taken at a resting state? Yes  Focused Assessment No change from prior assessment  Does the patient meet 2 or more of the SIRS criteria? No  MEWS guidelines implemented *See Row Information* Yes  Treat  MEWS Interventions Administered prn meds/treatments  Take Vital Signs  Increase Vital Sign Frequency  Yellow: Q 2hr X 2 then Q 4hr X 2, if remains yellow, continue Q 4hrs  Escalate  MEWS: Escalate Yellow: discuss with charge nurse/RN and consider discussing with provider and RRT  Notify: Charge Nurse/RN  Name of Charge Nurse/RN Notified kim  Date Charge Nurse/RN Notified 04/01/21  Time Charge Nurse/RN Notified 1055  Notify: Provider  Provider Name/Title Alfredia Ferguson  Date Provider Notified 04/01/21  Time Provider Notified 1055  Notification Type Page  Notification Reason Change in status  Provider response See new orders  Document  Patient Outcome Stabilized after interventions  Progress note created (see row info) Yes  Assess: SIRS CRITERIA  SIRS Temperature  1  SIRS Pulse 0  SIRS Respirations  0  SIRS WBC 0  SIRS Score Sum  1

## 2021-04-02 DIAGNOSIS — K209 Esophagitis, unspecified without bleeding: Secondary | ICD-10-CM | POA: Diagnosis not present

## 2021-04-02 LAB — CBC WITH DIFFERENTIAL/PLATELET
HCT: 23.5 % — ABNORMAL LOW (ref 36.0–46.0)
Hemoglobin: 7.8 g/dL — ABNORMAL LOW (ref 12.0–15.0)
MCH: 28.1 pg (ref 26.0–34.0)
MCHC: 33.2 g/dL (ref 30.0–36.0)
MCV: 84.5 fL (ref 80.0–100.0)
Platelets: 54 10*3/uL — ABNORMAL LOW (ref 150–400)
RBC: 2.78 MIL/uL — ABNORMAL LOW (ref 3.87–5.11)
RDW: 16.4 % — ABNORMAL HIGH (ref 11.5–15.5)
WBC: 1.2 10*3/uL — CL (ref 4.0–10.5)
nRBC: 0 % (ref 0.0–0.2)

## 2021-04-02 LAB — COMPREHENSIVE METABOLIC PANEL
ALT: 42 U/L (ref 0–44)
ALT: 37 U/L (ref 0–44)
AST: 26 U/L (ref 15–41)
AST: 18 U/L (ref 15–41)
Albumin: 2.4 g/dL — ABNORMAL LOW (ref 3.5–5.0)
Albumin: 2.4 g/dL — ABNORMAL LOW (ref 3.5–5.0)
Alkaline Phosphatase: 37 U/L — ABNORMAL LOW (ref 38–126)
Alkaline Phosphatase: 38 U/L (ref 38–126)
Anion gap: 9 (ref 5–15)
Anion gap: 8 (ref 5–15)
BUN: 12 mg/dL (ref 8–23)
BUN: 14 mg/dL (ref 8–23)
CO2: 28 mmol/L (ref 22–32)
CO2: 29 mmol/L (ref 22–32)
Calcium: 8.1 mg/dL — ABNORMAL LOW (ref 8.9–10.3)
Calcium: 8.1 mg/dL — ABNORMAL LOW (ref 8.9–10.3)
Chloride: 98 mmol/L (ref 98–111)
Chloride: 98 mmol/L (ref 98–111)
Creatinine, Ser: 0.51 mg/dL (ref 0.44–1.00)
Creatinine, Ser: 0.49 mg/dL (ref 0.44–1.00)
GFR, Estimated: >60 mL/min (ref >60)
GFR, Estimated: >60 mL/min (ref >60)
Glucose, Bld: 101 mg/dL — ABNORMAL HIGH (ref 70–99)
Glucose, Bld: 112 mg/dL — ABNORMAL HIGH (ref 70–99)
Potassium: 3.5 mmol/L (ref 3.5–5.1)
Potassium: 3.6 mmol/L (ref 3.5–5.1)
Sodium: 135 mmol/L (ref 135–145)
Sodium: 135 mmol/L (ref 135–145)
Total Bilirubin: 1 mg/dL (ref 0.3–1.2)
Total Bilirubin: 0.7 mg/dL (ref 0.3–1.2)
Total Protein: 5.8 g/dL — ABNORMAL LOW (ref 6.5–8.1)
Total Protein: 5.9 g/dL — ABNORMAL LOW (ref 6.5–8.1)

## 2021-04-02 LAB — PHOSPHORUS: Phosphorus: 1.9 mg/dL — ABNORMAL LOW (ref 2.5–4.6)

## 2021-04-02 LAB — SURGICAL PATHOLOGY

## 2021-04-02 LAB — MAGNESIUM: Magnesium: 2 mg/dL (ref 1.7–2.4)

## 2021-04-02 MED ORDER — HYDROMORPHONE HCL 2 MG/ML IJ SOLN
2.0000 mg | Freq: Three times a day (TID) | INTRAMUSCULAR | Status: DC
  Administered 2021-04-02 – 2021-04-03 (×5): 2 mg via INTRAVENOUS
  Filled 2021-04-02 (×5): qty 1

## 2021-04-02 MED ORDER — ADULT MULTIVITAMIN W/MINERALS CH
1.0000 | ORAL_TABLET | Freq: Every day | ORAL | Status: DC
  Administered 2021-04-02 – 2021-04-28 (×27): 1 via ORAL
  Filled 2021-04-02 (×27): qty 1

## 2021-04-02 MED ORDER — RESOURCE INSTANT PROTEIN PO PWD PACKET
1.0000 | Freq: Three times a day (TID) | ORAL | Status: DC
  Administered 2021-04-05 – 2021-04-28 (×27): 6 g via ORAL
  Filled 2021-04-02 (×79): qty 6

## 2021-04-02 MED ORDER — ENSURE ENLIVE PO LIQD: 237.0000 mL | Freq: Three times a day (TID) | ORAL | Status: DC

## 2021-04-02 MED ORDER — POTASSIUM PHOSPHATES 15 MMOLE/5ML IV SOLN
20.0000 mmol | Freq: Once | INTRAVENOUS | Status: AC
  Administered 2021-04-02: 20 mmol via INTRAVENOUS
  Filled 2021-04-02: qty 6.67

## 2021-04-02 NOTE — Progress Notes (Addendum)
RN notified by charge RN that patient had to be assisted to the floor by NT. NT was attempting to toilet patient when she stated she " felt weak." NT called for assistance and assisted patient to the floor. Durign this time RN of patient was assisting another patient with clean up and bed change. Advised use of steady to move patient with NT. [facility] made aware, safety zone placed per [facility]. Post fall huddle completed. Patient states it is not necessary to notify family.

## 2021-04-02 NOTE — Progress Notes (Signed)
Initial Nutrition Assessment  INTERVENTION:   -Calorie Count continues  -Ensure Enlive po TID, each supplement provides 350 kcal and 20 grams of protein  -Beneprotein TID with meals, each provides 25 kcals and 6g protein  NUTRITION DIAGNOSIS:   Inadequate oral intake related to cancer and cancer related treatments,decreased appetite (odynophagia) as evidenced by per patient/family report.  GOAL:   Patient will meet greater than or equal to 90% of their needs  MONITOR:   PO intake,Supplement acceptance,Labs,Weight trends,I & O's  REASON FOR ASSESSMENT:   Consult Assessment of nutrition requirement/status,Calorie Count  ASSESSMENT:   65 year old African-American female with a past medical history significant for but not limited to hypertension, type 2 diabetes mellitus, stage IV uterine cancer, history malignant pleural effusion diagnosed in February 2022 followed by Dr. Simeon Craft such currently on chemotherapy who had multiple symptoms most notably severe odynophagia the resultant minimal intake and profound weight loss.  Patient reports receiving the wrong breakfast 3 times this morning. States she received french toast but she has an egg allergy. Breakfast tray noted to be in room, no french toast was noted in [facility] system. Egg and shellfish allergy is in the system.  Pt denies nausea and pain with swallowing today. Pt answers some questions with just a nod of her head. Does not seem motivated to improve her PO intake.  Noted that only chicken broth was ordered for lunch.  Pt states she has drank Ensure in the past. Will order and was willing to try Beneprotein powder as well.   Calorie Count ordered.  6/5 results: B: 0% L: 60 kcals, 7g protein D: 0% No supplements ordered. Total: 60 kcals, 7g protein  Per weight records, pt has lost 53 lbs as of 5/26 (this is a 23% wt loss x 3 months).  Needs update weight for admission.  Medications: Magic mouthwash, Remeron,  Miralax, Carafate, K-Phos  Labs reviewed: CBGs: 110-145 Low Phos  NUTRITION - FOCUSED PHYSICAL EXAM:  No depletions noted.  Diet Order:   Diet Order            DIET SOFT Room service appropriate? Yes; Fluid consistency: Thin  Diet effective now                 EDUCATION NEEDS:   No education needs have been identified at this time  Skin:  Skin Assessment: Reviewed RN Assessment  Last BM:  6/4  Height:   Ht Readings from Last 1 Encounters:  03/22/21 5\' 6"  (1.676 m)    Weight:   Wt Readings from Last 1 Encounters:  03/22/21 76.9 kg   BMI:  There is no height or weight on file to calculate BMI.  Estimated Nutritional Needs:   Kcal:  2100-2300  Protein:  105-115g  Fluid:  2.1L/day  Clayton Bibles, MS, RD, LDN Inpatient Clinical Dietitian Contact information available via Amion

## 2021-04-02 NOTE — Progress Notes (Signed)
Called AC to consult about assist to the floor for this patient by Nurse Tech. This nurse assisted nurse tech in placing patient back to bed. NT was initially assisting patient to the [facility] and patient stated that she did not feel comfortable standing. When trying to assist patient back to bed her knees buckled. This nurse did not arrive until call bell was answered. Patient did not complain of new pain. Called AC to consult on whether to do a fall protocol. AC stated ok to document in flow sheet fall section and to complete a safety zone. Informed by the [facility] that is all that is needed since it was an assist to the floor.

## 2021-04-02 NOTE — Progress Notes (Signed)
PROGRESS NOTE    Yvette Roberts  FWY:[id number] DOB: 1956/02/24 DOA: 03/27/2021 PCP: [name], FNP   Brief Narrative:  Patient is a 65 year old African-American female with a past medical history significant for but not limited to hypertension, type 2 diabetes mellitus, stage IV uterine cancer, history malignant pleural effusion diagnosed in February 2022 followed by Dr. Simeon Craft such currently on chemotherapy who had multiple symptoms most notably severe odynophagia the resultant minimal intake and profound weight loss.  She had been prescribed liquid medications for supportive care however unable to get them.  She reports that she lost about 50 to 60 pounds in last 3 months and that her main complaint is severe odynophagia with any attempt at swallowing.  She also admitted to having some mild abdominal discomfort which is somewhat chronic and unchanged.  She underwent further work-up and had an EGD and found nonsevere esophagitis in the proximal and distal esophagus as well as scattered mild inflammation with erythema in the entire stomach.  Medical oncology has been consulted as well and recommending trying Magic mouthwash with lidocaine prior to attempting to eat.  She is on the PPI and sucralfate but is reluctant swallow her own saliva because of the pain.  We cannot safely discharge the patient until she is able to tolerate opioid diet and able to swallow her pills.  The medical oncologist is reluctant to offer her peritoneal nutrition given the high risk of infection in the setting of severe pancytopenia.  We will order a calorie count to see how much that she is actually taking in.  A few nights ago she developed a fever and now she is febrile neutropenic so we will panculture her and start IV cefepime.  Her urine cultured last time to grow out 70,000 colony-forming units of Pseudomonas aeruginosa will cover with IV cefepime and repeat a urinalysis and urine culture. Repeat Urinalysis showed  >100,000 GNR.  Patient continues to have some burning and discomfort in her urine so likely this is the source of her infection and will continue Treatment with IV Cefepime   Assessment & Plan:   Principal Problem:   Esophagitis Active Problems:   Type 2 diabetes mellitus without complication (HCC)   Hypertension   Uterine cancer (HCC)   Malignant pleural effusion   Protein-calorie malnutrition, severe (HCC)   Odynophagia  Odynophagia and Esophagitis, improving  GERD -GI was consulted and underwent EGD on 03/29/21 which showed mild nonsevere esophagitis in the proximal and distal esophagus as well as scattered mild inflammation with erythema in the entire stomach but she has profound symptoms of Esophagitis  -Her Bx are pending -Her IV fluconazole has nos been discontinued but continuing Nystatin 500,000 units po 4x Daily  -Her Odynophagia could be related to Esophagitis and medical oncology encouraged her to try and swallow more now that her pain is better controlled; medical oncology recommends trying numbing medicine with Magic mouthwash and lidocaine before attempting to swallow given that the patient is somewhat reluctant to try swallowing -GI recommending continuing Pantoprazole BID and Carafate  1 gram po TIDwm and qHS as well as advancing diet to Soft and Slowly  Advance diet as tolerated  -Patient to follow up with GI in the outpatient setting  -We will order a calorie count given that she is still not taking much given her fear of swallowing and pain; overnight the nursing states that she is eating a little bit more and tolerating her food  Febrile Neutropenia -Patient had a  T-max of 101 on 03/31/21 and she was neutropenic with a WBC of 0.8; Now WBC is 1.2 -We will start the patient on IV cefepime and panculture and obtain blood cultures x2, urinalysis and urine culture again as well as a chest x-ray -Repeat Urinalysis showing appearance with amber color urine, negative glucose,  negative hemoglobin, 5 ketones, negative leukocytes, negative nitrates, rare bacteria, 0-5 RBCs per high-powered field, 0-5 squamous epithelial cells, 0-5 WBCs, and repeat urinalysis and urine culture showing greater than 100,000 colony-forming units of Pseudomonas aeruginosa sensitivities pending -Blood cultures x2 showed no growth to date less than 24 hours -Have started G-CSF with Granix 480 mcg sq Daily -Follow cultures carefully and have oncology weigh in again  Pancytopenia, -Patient's WBC is now 1.2, Hgb/Hct is now 7.8/23.5 and Platelet Count is 54 and in the setting of her chemotherapy -Have started G-CSF support as above -Oncology are also recommending no blood transfusion unless her hemoglobin less than 8 and her platelet count is less than 10 or if she has active bleeding -Will consider giving a unit of blood if she continues to remain below 8 tomorrow -Continue monitor and trend and repeat CBC in a.m.  Pseudomonas aeruginosa UTI -Check U/A and Urine Cx; initial urinalysis was relatively unremarkable showing moderate hemoglobin, 20 ketones, moderate leukocytes, no bacteria seen, 0-5 squamous epithelial cells, 0-5 WBCs but urine culture is growing 70,000 colony-forming units of Pseudomonas aeruginosa that was pansensitive -Her WBC was 1.0 but she was afebrile yesterday however spiked a temperature last evening of 101 and WBC has dropped to 0.8 -We will repeat urinalysis and urine culture and empirically start treatment with IV cefepime -Repeat urinalysis showed a clear appearance with amber color urine, negative glucose, 5 ketones, negative nitrites, negative leukocytes, rare bacteria, 0-5 squamous epithelial cells, 0-5 RBCs per high-power field, 0-5 WBCs -Repeat urine culture showing greater than 100,000 colony-forming units of Pseudomonas aeruginosa with sensitivities pending  Hypokalemia -Was replete and improved but dropped again -Patient is now 3.5 -Replete with IV K-Phos 20  mmol -Continue monitor and replete as necessary -Repeat CMP in a.m.  Hypomagnesemia -The patient magnesium level is now 2.0 -Continue monitor and treat as necessary -Repeat magnesium level in a.m.  Hypophosphatemia -Patient's phosphorus level was is now 1.9 -Replete with IV K Phos 20 mmol  -Continue to Monitor and Replete As necessary -Repeat Phos Level in the AM   Severe Protein Calorie Malnutrition -Nutritionist was consulted for further evaluation and recommendations however she has not really taken p.o. was yesterday -Estimated body mass index is 27.37 kg/m as calculated from the following:   Height as of 03/22/21: 5\' 6"  (1.676 m).   Weight as of 03/22/21: 76.9 kg.  -Her prealbumin was 16 -She refused a [place] tube feeding -She will need to stay in house until she is able to keep herself nutritionally replete either by p.o. or some other modality which is to be discussed -Per Nurse her po Intake is improving some what and she at her [place] last night and ordered breakfast  Metastatic Uterine Cancer -She underwent a chest CT of the abdomen pelvis which showed "Decreased loculated pleural fluid and pleural thickening in the chest follows diminishing perihepatic soft tissue and capsular hepatic implants as described.2. No adenopathy by size criteria in the abdomen or pelvis. Minimal soft tissue in the intra-aortocaval groove is all that remains on image 77 of series 2 in the retroperitoneum. 3. Decreased bulk of the uterus subjectively and of RIGHT adnexal structures as  described. 4. New areas of bony sclerosis compatible with skeletal metastasis. No signs of increased FDG uptake and no visible sclerosis on the  previous PET. In the context of improvement of other disease it is possible that this represents treated metastasis. Would suggest attention on follow-up. 5. Loculated RIGHT-sided pleural fluid with signs of pleural thickening in the lower RIGHT chest otherwise with similar  appearance. 6. Cholelithiasis without evidence of acute cholecystitis. 7. Aortic atherosclerosis." -Her medical oncologist has reviewed the CT scans and feels that she has had good response to treatment and recommending continuing supportive care -Continue with pain control with Hydromorphone 1 mg IV q3 prn Severe Pain and Gabapentin 200 mg po BID -C/w Antiemetics and Supportive Care with Ondansetron 4 mg po/IV q6hprn Nausea   Malignant Pleural Effusion -CT Scan showed "Loculated RIGHT-sided pleural fluid with 6.5 x 3.6 cm axial dimension as compared to 8.1 x 3.9 cm. Basilar volume loss and signs of pleural thickening with decreased pleural thickening, markedly diminished since the prior PET and with continued decreased pleural thickening particularly in the inferior RIGHT chest even since the recent CT of the chest. Over the RIGHT hemidiaphragm pleural thickness approximately 7 mm as compared to 12 mm greatest thickness. LEFT chest is clear. Airways are patent." with the impression being "Decreased loculated pleural fluid and pleural thickening in the chest follows diminishing perihepatic soft tissue and capsular hepatic implants as described" -No Respiratory Distress   Hypertension -C/w Carvedilol 12.5 mg po BID -Continue to Monitor BP per Protocol  -Last BP reading was 115/77  Diabetes Mellitus Type 2 -Last hemoglobin A1c was 6.1 -Blood sugars ranging from 96-122 on daily BMPs/CMP's -CBGs ranging from 110-145 -continue to monitor blood sugars carefully and if necessary will add sensitive NovoLog sliding scale insulin AC  Anxiety and Depression -C/w Mirtazapine 50 mg p.o. nightly and Fluoxetine 20 mg p.o. daily  Hyperbilirubinemia -Likely reactive -Patient's T bili now gone from 1.4 -> 1.0  -Continue monitor and trend and repeat CMP in a.m.  DVT prophylaxis: Enoxaparin 40 mg sq q24h Code Status: FULL CODE Family Communication: No family present at bedside  Disposition Plan: Pending  further improvement and tolerance of diet and clearance by medical oncology  Status is: Inpatient  Remains inpatient appropriate because:Unsafe d/c plan, IV treatments appropriate due to intensity of illness or inability to take PO and Inpatient level of care appropriate due to severity of illness   Dispo: The patient is from: Home              Anticipated d/c is to: Home              Patient currently is not medically stable to d/c.   Difficult to place patient No  Consultants:   Medical Oncology   Gastroenterology    Procedures:  EGD Findings:      Non-severe esophagitis was found in the proximal and distal esophagus.       Biopsies were taken with a cold forceps for histology.      Scattered mild inflammation characterized by erythema was found in the       entire examined stomach. Biopsies were taken with a cold forceps for       histology.      The cardia and gastric fundus were normal on retroflexion.      The duodenal bulb, first portion of the duodenum and second portion of       the duodenum were normal. Impression:               -  Non-severe esophagitis. Biopsied.                           - Gastritis. Biopsied.                           - Normal duodenal bulb, first portion of the                            duodenum and second portion of the duodenum.  Antimicrobials:  Anti-infectives (From admission, onward)   Start     Dose/Rate Route Frequency Ordered Stop   04/01/21 0930  ceFEPIme (MAXIPIME) 2 g in sodium chloride 0.9 % 100 mL IVPB        2 g 200 mL/hr over 30 Minutes Intravenous Every 8 hours 04/01/21 0839     03/27/21 1800  fluconazole (DIFLUCAN) IVPB 200 mg  Status:  Discontinued        200 mg 100 mL/hr over 60 Minutes Intravenous Daily 03/27/21 1734 03/29/21 1633        Subjective: Seen and examined at bedside and fever has improved and she is eating a little bit better.  Still complains of some burning and discomfort in her urine.  Mildly coughing  up some sputum today and wanting another emesis bag.  No other concerns or complaints reported at this time.  Objective: Vitals:   04/01/21 1720 04/01/21 2133 04/02/21 0611 04/02/21 1451  BP: 100/66 110/72 124/72 115/77  Pulse: 71 69 75 66  Resp: 19   16  Temp: 98.2 F (36.8 C) 98 F (36.7 C) 97.8 F (36.6 C) 97.6 F (36.4 C)  TempSrc: Oral Oral Oral Oral  SpO2: 95% 96% 96% 95%    Intake/Output Summary (Last 24 hours) at 04/02/2021 1530 Last data filed at 04/02/2021 0331 Gross per 24 hour  Intake 300 ml  Output --  Net 300 ml   There were no vitals filed for this visit.  Examination: Physical Exam:  Constitutional: A[place] ill-appearing older appearing than her stated age African-American female currently in no acute distress appears calm Eyes: Lids and conjunctivae normal, sclerae anicteric  ENMT: External Ears, Nose appear normal. Grossly normal hearing.  Neck: Appears normal, supple, no cervical masses, normal ROM, no appreciable thyromegaly; no JVD Respiratory: Diminished to auscultation bilaterally, no wheezing, rales, rhonchi or crackles. Normal respiratory effort and patient is not tachypenic. No accessory muscle use.  Unlabored breathing and not wearing any supplemental oxygen via nasal cannula Cardiovascular: RRR, no murmurs / rubs / gallops. S1 and S2 auscultated.  Trace lower extremity edema Abdomen: Soft, non-tender, non-distended.  Bowel sounds positive.  GU: Deferred. Musculoskeletal: No clubbing / cyanosis of digits/nails. No joint deformity upper and lower extremities.  Skin: No rashes, lesions, ulcers on limited skin evaluation. No induration; Warm and dry.  Neurologic: CN 2-12 grossly intact with no focal deficits. Romberg sign and cerebellar reflexes not assessed.  Psychiatric: Normal judgment and insight. Alert and oriented x 3. Normal mood and appropriate affect.   Data Reviewed: I have personally reviewed following labs and imaging studies  CBC: Recent  Labs  Lab 03/29/21 0502 03/30/21 0553 03/31/21 0518 04/01/21 0459 04/02/21 0506  WBC 1.5* 1.1* 1.0* 0.8* 1.2*  NEUTROABS  --   --  0.1*  --  TOO FEW TO COUNT  HGB 8.8* 8.2* 8.5* 8.2* 7.8*  HCT 27.3* 25.5* 25.7* 25.3*  23.5*  MCV 86.1 86.4 86.0 85.8 84.5  PLT 93* 76* 69* 67* 54*   Basic Metabolic Panel: Recent Labs  Lab 03/29/21 0502 03/29/21 2303 03/30/21 0553 03/31/21 0518 04/01/21 0459 04/02/21 0506  NA 135  --  135 136 134* 135  K 2.7* 2.7* 4.1 3.1* 3.2* 3.5  CL 94*  --  99 95* 94* 98  CO2 31  --  28 32 30 28  GLUCOSE 96  --  101* 122* 130* 101*  BUN 7*  --  <5* <5* 6* 12  CREATININE 0.42*  --  0.44 0.51 0.51 0.51  CALCIUM 8.3*  --  8.2* 8.5* 8.4* 8.1*  MG 1.6* 1.8  --  1.4* 1.7 2.0  PHOS 2.1*  --   --  2.0* 2.8 1.9*   GFR: Estimated Creatinine Clearance: 73.4 mL/min (by C-G formula based on SCr of 0.51 mg/dL). Liver Function Tests: Recent Labs  Lab 03/27/21 1755 03/28/21 0500 03/31/21 0518 04/01/21 0459 04/02/21 0506  AST 57* 70* 23 22 26   ALT 57* 67* 38 33 42  ALKPHOS 42 46 41 43 37*  BILITOT 1.1 1.0 1.4* 1.4* 1.0  PROT 6.1* 6.2* 5.8* 5.9* 5.8*  ALBUMIN 3.0* 3.1* 2.6* 2.6* 2.4*   No results for input(s): LIPASE, AMYLASE in the last 168 hours. No results for input(s): AMMONIA in the last 168 hours. Coagulation Profile: No results for input(s): INR, PROTIME in the last 168 hours. Cardiac Enzymes: No results for input(s): CKTOTAL, CKMB, CKMBINDEX, TROPONINI in the last 168 hours. BNP (last 3 results) No results for input(s): PROBNP in the last 8760 hours. HbA1C: No results for input(s): HGBA1C in the last 72 hours. CBG: Recent Labs  Lab 03/31/21 2218 04/01/21 0720 04/01/21 1237 04/01/21 1547 04/01/21 2129  GLUCAP 143* 132* 145* 123* 110*   Lipid Profile: No results for input(s): CHOL, HDL, LDLCALC, TRIG, CHOLHDL, LDLDIRECT in the last 72 hours. Thyroid Function Tests: No results for input(s): TSH, T4TOTAL, FREET4, T3FREE, THYROIDAB in the  last 72 hours. Anemia Panel: No results for input(s): VITAMINB12, FOLATE, FERRITIN, TIBC, IRON, RETICCTPCT in the last 72 hours. Sepsis Labs: No results for input(s): PROCALCITON, LATICACIDVEN in the last 168 hours.  Recent Results (from the past 240 hour(s))  SARS CORONAVIRUS 2 (TAT 6-24 HRS) Nasopharyngeal Nasopharyngeal Swab     Status: None   Collection Time: 03/28/21  5:20 AM   Specimen: Nasopharyngeal Swab  Result Value Ref Range Status   SARS Coronavirus 2 NEGATIVE NEGATIVE Final    Comment: (NOTE) SARS-CoV-2 target nucleic acids are NOT DETECTED.  The SARS-CoV-2 RNA is generally detectable in upper and lower respiratory specimens during the acute phase of infection. Negative results do not preclude SARS-CoV-2 infection, do not rule out co-infections with other pathogens, and should not be used as the sole basis for treatment or other patient management decisions. Negative results must be combined with clinical observations, patient history, and epidemiological information. The expected result is Negative.  Fact Sheet for Patients: SugarRoll.be  Fact Sheet for Healthcare Providers: https://www.woods-mathews.com/  This test is not yet approved or cleared by the [place] FDA and  has been authorized for detection and/or diagnosis of SARS-CoV-2 by FDA under an Emergency Use Authorization (EUA). This EUA will remain  in effect (meaning this test can be used) for the duration of the COVID-19 declaration under Se ction 564(b)(1) of the Act, 21 U.S.C. section 360bbb-3(b)(1), unless the authorization is terminated or revoked sooner.  Performed at [facility],  [address] [place], [place] [address]   Culture, Urine     Status: Abnormal   Collection Time: 03/30/21 11:55 AM   Specimen: Urine, Clean Catch  Result Value Ref Range Status   Specimen Description   Final    URINE, CLEAN CATCH Performed at [facility], [place] [address] [place], [place] [address]    Special Requests   Final    NONE Performed at [facility], [place] [address] [place], [place] [address]    Culture 70,000 COLONIES/mL PSEUDOMONAS AERUGINOSA (A)  Final   Report Status 04/01/2021 FINAL  Final   Organism ID, Bacteria PSEUDOMONAS AERUGINOSA (A)  Final      Susceptibility   Pseudomonas aeruginosa - MIC*    CEFTAZIDIME 2 SENSITIVE Sensitive     CIPROFLOXACIN <=0.25 SENSITIVE Sensitive     GENTAMICIN 2 SENSITIVE Sensitive     IMIPENEM 2 SENSITIVE Sensitive     PIP/TAZO <=4 SENSITIVE Sensitive     CEFEPIME 2 SENSITIVE Sensitive     * 70,000 COLONIES/mL PSEUDOMONAS AERUGINOSA  Culture, Urine     Status: Abnormal (Preliminary result)   Collection Time: 04/01/21  8:56 AM   Specimen: Urine, Random  Result Value Ref Range Status   Specimen Description   Final    URINE, RANDOM Performed at [facility], [place] [address] [place], [place] [address]    Special Requests   Final    NONE Performed at [facility], [place] [address] [place], [place] [address]    Culture (A)  Final    >=100,000 COLONIES/mL PSEUDOMONAS AERUGINOSA CULTURE REINCUBATED FOR BETTER GROWTH Performed at [facility], [place] [address], [place], [place] [address]    Report Status PENDING  Incomplete  Culture, blood (routine x 2)     Status: None (Preliminary result)   Collection Time: 04/01/21  9:16 AM   Specimen: BLOOD  Result Value Ref Range Status   Specimen Description   Final    BLOOD BLOOD RIGHT HAND Performed at [place] [address] [place], [place] [address]    Special Requests   Final    BOTTLES DRAWN AEROBIC AND ANAEROBIC Blood Culture adequate volume Performed at [place] [address], [place], [place] [address]    Culture   Final    NO GROWTH < 24 HOURS Performed at [place] [address]  [place], [place] [address]    Report Status PENDING  Incomplete  Culture, blood (routine x 2)     Status: None (Preliminary result)   Collection Time: 04/01/21 10:38 AM   Specimen: BLOOD  Result Value Ref Range Status   Specimen Description   Final    BLOOD LEFT HAND Performed at [place] [address] [place], [place] [address]    Special Requests   Final    BOTTLES DRAWN AEROBIC ONLY Blood Culture results may not be optimal due to an inadequate volume of blood received in culture bottles Performed at [place] [address], [place], [place] [address]    Culture   Final    NO GROWTH < 24 HOURS Performed at [place] [address], [place],  [address]    Report Status PENDING  Incomplete    RN Pressure Injury Documentation:     Estimated body mass index is 27.37 kg/m as calculated from the following:   Height as  of 03/22/21: 5\' 6"  (1.676 m).   Weight as of 03/22/21: 76.9 kg.  Malnutrition Type: Nutrition Problem: Inadequate oral intake Etiology: cancer and cancer related treatments,decreased appetite (odynophagia) Malnutrition Characteristics: Signs/Symptoms: per patient/family report Nutrition Interventions: Interventions: Ensure Enlive (each supplement provides 350kcal and 20 grams of protein),Refer to RD note for recommendations  Radiology Studies: DG CHEST PORT 1 VIEW  Result Date: 04/01/2021 CLINICAL DATA:  Neutropenic fever EXAM: PORTABLE CHEST 1 VIEW COMPARISON:  March 31, 2021 FINDINGS: Stable small right-sided loculated pleural effusion. Hazy opacification of the right hemithorax with fluid in the minor fissure likely representing a small free-flowing pleural effusion. Atelectasis in the right lung base. Left lung is clear. Right chest wall Port-A-Cath with tip overlying the right atrium. The heart size and mediastinal contours are unchanged. Thoracic spondylosis with degenerative changes shoulders. IMPRESSION:  Stable right-sided pleural fluid with adjacent atelectasis. Left lung is clear. Electronically Signed   By: Dahlia Bailiff MD   On: 04/01/2021 11:37   Scheduled Meds: . carvedilol  12.5 mg Oral BID WC  . chlorhexidine  15 mL Mouth Rinse BID  . Chlorhexidine Gluconate Cloth  6 each Topical Daily  . enoxaparin (LOVENOX) injection  40 mg Subcutaneous Q24H  . feeding supplement  237 mL Oral TID BM  . FLUoxetine  20 mg Oral Daily  . gabapentin  200 mg Oral BID  .  HYDROmorphone (DILAUDID) injection  2 mg Intravenous TID  . magic mouthwash w/lidocaine  2 mL Oral TID  . mouth rinse  15 mL Mouth Rinse q12n4p  . melatonin  10 mg Oral QHS  . mirtazapine  15 mg Oral QHS  . multivitamin with minerals  1 tablet Oral Daily  . nystatin  5 mL Oral QID  . pantoprazole sodium  40 mg Oral BID  . polyethylene glycol  17 g Oral Daily  . protein supplement  1 Scoop Oral TID WC  . sucralfate  1 g Oral TID WC & HS  . Tbo-filgastrim (GRANIX) SQ  480 mcg Subcutaneous q1800   Continuous Infusions: . ceFEPime (MAXIPIME) IV 2 g (04/02/21 0931)  . potassium PHOSPHATE IVPB (in mmol) 20 mmol (04/02/21 1125)    LOS: 6 days   Kerney Elbe, DO [facility] PAGER is on [place]  If 7PM-7AM, please contact night-coverage [email or web address]

## 2021-04-02 NOTE — Care Management Important Message (Signed)
Important Message  Patient Details IM Letter given to the Patient. Name: Yvette Roberts MRN: [id number] Date of Birth: Nov 05, 1955   Medicare Important Message Given:  Yes     Kerin Salen 04/02/2021, 11:02 AM

## 2021-04-02 NOTE — Progress Notes (Addendum)
I have seen her, examined her and agreed with documentation as follows Yvette Roberts   DOB:[birth year]   ZO#:[id number]    ASSESSMENT & PLAN:   Metastatic Uterine cancer ([place]) I have personally reviewed CT imaging She has good response to treatment Continue supportive care: I have cancelled all her future treatment until she is fully recovered   Esophagitis She has profound symptoms of esophagitis Appreciated GI work-up I have reviewed EGD report She will continue antiacid medications as prescribed by GI service   Dysphagia This could be related to esophagitis  She will attempt to swallow more now that her pain is better controlled; however, it appears that the patient is somewhat reluctant to attend swallowing whatsoever I have reviewed this with her nursing staff to give her numbing medicine before attempt to swallow   Cancer associated pain She has severe esophageal pain and right upper quadrant pain  Per patient request, I will schedule dilaudid three times daily, she can continue as needed dilaudid as well   Abnormal weight loss She has severe, profound weight loss, initially due to undiagnosed cancer and now inability to swallow with cachexia On February 23, she weighed approximately 223 pounds As of last week, she weighed only 169 pounds I am somewhat reluctant to recommend parenteral nutrition; there is high risk of infection especially in the setting of severe pancytopenia We discussed the risk and benefits of feeding tube placement and the patient declined I warned the patient that she is not likely going to improve with near 0 nutritional intake The patient is attempting to take in more p.o. and calorie count in process  Acquired pancytopenia Developed fever over the weekend and has been started on Granix; WBC trending upward She does not need blood transfusion unless hemoglobin is less than 8 or platelet count less than 10 Monitor closely   Severe electrolyte  imbalance Due to poor oral intake Continue replacement therapy   Goals of care, counseling/discussion I have reviewed goals of care discussion in the purpose of admission with the patient and her niece and they are both in agreement to be admitted   Discharge planning  She is not ready to be discharged until she can tolerate oral diet and able to swallow her pills   Mikey Bussing, NP 04/02/2021 9:13 AM [name], MD  Subjective:  This morning, the patient reports that she is able to swallow her own saliva and take in small amounts of liquid She feels that pain is minimally improved She remains on IV Dilaudid and also takes oral oxycodone intermittently Had a fever up to 101.7 yesterday morning Cultures are pending She was started on Granix  Objective:  Vitals:   04/01/21 2133 04/02/21 0611  BP: 110/72 124/72  Pulse: 69 75  Resp:    Temp: 98 F (36.7 C) 97.8 F (36.6 C)  SpO2: 96% 96%     Intake/Output Summary (Last 24 hours) at 04/02/2021 0913 Last data filed at 04/02/2021 0331 Gross per 24 hour  Intake 535 ml  Output 300 ml  Net 235 ml    GENERAL:alert, no distress and comfortable OROPHARYNX: Small blisters around her lips but otherwise no mucositis or thrush NEURO: alert & oriented x 3 with fluent speech, no focal motor/sensory deficits   Labs:  Recent Labs    11/23/20 0324 11/23/20 0421 03/31/21 0518 04/01/21 0459 04/02/21 0506  NA 132*   < > 136 134* 135  K 4.5   < > 3.1* 3.2* 3.5  CL 96*   < > 95* 94* 98  CO2 22   < > 32 30 28  GLUCOSE 212*   < > 122* 130* 101*  BUN 21   < > <5* 6* 12  CREATININE 1.28*   < > 0.51 0.51 0.51  CALCIUM 9.5   < > 8.5* 8.4* 8.1*  GFRNONAA 46*   < > >60 >60 >60  PROT 8.2*   < > 5.8* 5.9* 5.8*  ALBUMIN 3.4*   < > 2.6* 2.6* 2.4*  AST 23   < > 23 22 26   ALT 23   < > 38 33 42  ALKPHOS 63   < > 41 43 37*  BILITOT 0.9   < > 1.4* 1.4* 1.0  BILIDIR 0.2  --   --   --   --   IBILI 0.7  --   --   --   --    < > = values in this  interval not displayed.    Studies:  DG Chest 2 View  Result Date: 03/27/2021 CLINICAL DATA:  65 year old female with hypertension and diabetes. Stage IV uterine cancer. EXAM: CHEST - 2 VIEW COMPARISON:  Chest radiograph dated 02/25/2021. FINDINGS: Right-sided Port-A-Cath similar position. Small right pleural effusion similar to prior radiograph. Interval decrease in the size of the opacity seen in the right mid lung field. The left lung is clear. No pneumothorax. Stable cardiomediastinal silhouette. Degenerative changes of the spine. No acute osseous pathology. IMPRESSION: Decrease in the size of opacity in the right mid lung field compared to prior radiograph. Unchanged small right pleural effusion. Electronically Signed   By: Anner Crete M.D.   On: 03/27/2021 19:23   CT CHEST ABDOMEN PELVIS W CONTRAST  Result Date: 03/28/2021 CLINICAL DATA:  Ovarian cancer assess treatment response. EXAM: CT CHEST, ABDOMEN, AND PELVIS WITH CONTRAST TECHNIQUE: Multidetector CT imaging of the chest, abdomen and pelvis was performed following the standard protocol during bolus administration of intravenous contrast. CONTRAST:  162mL OMNIPAQUE IOHEXOL 300 MG/ML  SOLN COMPARISON:  PET-CT from December 08, 2020 and CT of the chest of Feb 25, 2021. FINDINGS: CT CHEST FINDINGS Cardiovascular: RIGHT-sided Port-A-Cath terminates in the lower RIGHT atrium similar to recent CT imaging, perhaps influenced by arm position as on the chest x-ray the tip is at the caval to atrial junction. Heart size is normal without substantial pericardial effusion. Normal caliber of the thoracic aorta. Normal caliber of the central pulmonary vasculature. Mediastinum/Nodes: No thoracic inlet adenopathy. No axillary adenopathy. No mediastinal lymphadenopathy. Esophagus mildly patulous. Lungs/Pleura: Loculated RIGHT-sided pleural fluid with 6.5 x 3.6 cm axial dimension as compared to 8.1 x 3.9 cm. Basilar volume loss and signs of pleural thickening  with decreased pleural thickening, markedly diminished since the prior PET and with continued decreased pleural thickening particularly in the inferior RIGHT chest even since the recent CT of the chest. Over the RIGHT hemidiaphragm pleural thickness approximately 7 mm as compared to 12 mm greatest thickness. LEFT chest is clear. Airways are patent. Musculoskeletal: See below for full musculoskeletal details. CT ABDOMEN PELVIS FINDINGS Hepatobiliary: Low-density lesions along the cephalad margin of the RIGHT hemi liver and across the top of the RIGHT hemidiaphragm and diminished in size considerably compared to the PET exam of February of 2022. Pleural thickening across the surface of the liver also with decreased thickness (image 47/2) 7 mm greatest thickness as compared to 14 mm greatest thickness. Area of low attenuation along the capsule of the liver (image  45/2) overlying hepatic subsegment Roman numeral 8 measuring 18 mm as compared to 20 mm on the most recent comparison and is much as 43 mm on the prior CT evaluation. Capsular implant with extension into hepatic parenchyma much less conspicuous today than on the previous PET-CT from February and the CT from Feb 25, 2021. This area measuring approximately 1.8 as compared to 2.4 cm based on comparison with the most recent study. Much smaller than on the study of February of 2022 where it measured approximately 41 mm. Cystic area along the gallbladder fossa and in the inferior aspect of the RIGHT hemi liver with stable appearance largest area measuring 3 cm. Portal vein is patent. Hepatic veins are patent. Cholelithiasis without pericholecystic stranding. No sign of biliary duct distension. Pancreas: Normal, without mass, inflammation or ductal dilatation. Spleen: Spleen normal size and contour. Adrenals/Urinary Tract: Adrenal glands are normal. Symmetric renal enhancement. No hydronephrosis. Smooth contour the urinary bladder. Stomach/Bowel: No acute  gastrointestinal process soft tissue adjacent to the appendix in the RIGHT lower quadrant without signs of adjacent stranding is an unchanged caliber of the appendix dating back to February 2021 in the setting of known peritoneal disease with diminished fluid in the pelvis and omental and serosal nodularity throughout the abdomen. Vascular/Lymphatic: Normal caliber abdominal aorta and IVC. There is no gastrohepatic or hepatoduodenal ligament lymphadenopathy. No retroperitoneal or mesenteric lymphadenopathy. Reproductive: Decreased bulky appearance of the uterus. RIGHT adnexal cystic and solid area measuring 4.5 x 2.5 cm as compared to 5.9 x 3.1 cm. Decreased ascites in the pelvis. Other: Decreased ascites as above. Decreased signs of omental nodularity and serosal nodularity. Liver capsular disease as described above. Discrete nodules in the LEFT omentum persist, largest approximately 6 mm (image 88/2) previously 6-7 mm. Focal thickening of the LEFT rectus muscle is diminished no measurable lesion in this area, thickness of the LEFT rectus muscle on image 103 of series 2 is 13 mm as compared to 24 mm. The adjacent omental and peritoneal nodularity and stranding is subjectively diminished since the prior study. Musculoskeletal: Spinal degenerative changes. New sclerotic focus in the RIGHT proximal femur in the femoral neck and head junction measuring 14 mm. New sclerotic focus at the T12 level (image 60/2) 13 mm. Sclerosis at the T12 level is present however on the study of May of 2022. Not seen on the PET exam of February 2022. Spinal degenerative changes. IMPRESSION: 1. Decreased loculated pleural fluid and pleural thickening in the chest follows diminishing perihepatic soft tissue and capsular hepatic implants as described. 2. No adenopathy by size criteria in the abdomen or pelvis. Minimal soft tissue in the intra-aortocaval groove is all that remains on image 77 of series 2 in the retroperitoneum. 3. Decreased  bulk of the uterus subjectively and of RIGHT adnexal structures as described. 4. New areas of bony sclerosis compatible with skeletal metastasis. No signs of increased FDG uptake and no visible sclerosis on the previous PET. In the context of improvement of other disease it is possible that this represents treated metastasis. Would suggest attention on follow-up. 5. Loculated RIGHT-sided pleural fluid with signs of pleural thickening in the lower RIGHT chest otherwise with similar appearance. 6. Cholelithiasis without evidence of acute cholecystitis. 7. Aortic atherosclerosis. Electronically Signed   By: Zetta Bills M.D.   On: 03/28/2021 15:52   DG CHEST PORT 1 VIEW  Result Date: 04/01/2021 CLINICAL DATA:  Neutropenic fever EXAM: PORTABLE CHEST 1 VIEW COMPARISON:  March 31, 2021 FINDINGS: Stable small right-sided loculated  pleural effusion. Hazy opacification of the right hemithorax with fluid in the minor fissure likely representing a small free-flowing pleural effusion. Atelectasis in the right lung base. Left lung is clear. Right chest wall Port-A-Cath with tip overlying the right atrium. The heart size and mediastinal contours are unchanged. Thoracic spondylosis with degenerative changes shoulders. IMPRESSION: Stable right-sided pleural fluid with adjacent atelectasis. Left lung is clear. Electronically Signed   By: Dahlia Bailiff MD   On: 04/01/2021 11:37   DG CHEST PORT 1 VIEW  Result Date: 03/31/2021 CLINICAL DATA:  Shortness of breath.  History of ovarian carcinoma EXAM: PORTABLE CHEST 1 VIEW COMPARISON:  Chest radiograph Mar 27, 2021; chest CT March 28, 2021 FINDINGS: Apparent loculated pleural effusion on the right remains. There is also an apparent free-flowing small right pleural effusion, not appreciably changed from recent CT. Atelectasis right base. Left lung clear. Heart is upper normal in size with pulmonary vascularity normal. Port-A-Cath tip is in the right atrium slightly beyond the superior  vena cava-right atrium junction. No adenopathy appreciable. Degenerative change in each shoulder noted. No pneumothorax. IMPRESSION: Pleural effusion on the right, primarily loculated, essentially stable compared to recent CT examination. Right base atelectasis noted. Left lung clear. No new opacity evident. Stable cardiac silhouette. Port-A-Cath present on the right. Electronically Signed   By: Lowella Grip III M.D.   On: 03/31/2021 08:12

## 2021-04-03 DIAGNOSIS — R34 Anuria and oliguria: Secondary | ICD-10-CM

## 2021-04-03 DIAGNOSIS — K209 Esophagitis, unspecified without bleeding: Secondary | ICD-10-CM | POA: Diagnosis not present

## 2021-04-03 LAB — MAGNESIUM: Magnesium: 2.1 mg/dL (ref 1.7–2.4)

## 2021-04-03 LAB — CBC WITH DIFFERENTIAL/PLATELET
Abs Immature Granulocytes: 0.06 10*3/uL (ref 0.00–0.07)
Basophils Absolute: 0 10*3/uL (ref 0.0–0.1)
Basophils Relative: 1 %
Eosinophils Absolute: 0 10*3/uL (ref 0.0–0.5)
Eosinophils Relative: 0 %
HCT: 24.6 % — ABNORMAL LOW (ref 36.0–46.0)
Hemoglobin: 7.9 g/dL — ABNORMAL LOW (ref 12.0–15.0)
Immature Granulocytes: 2 %
Lymphocytes Relative: 31 %
Lymphs Abs: 1.1 10*3/uL (ref 0.7–4.0)
MCH: 27.7 pg (ref 26.0–34.0)
MCHC: 32.1 g/dL (ref 30.0–36.0)
MCV: 86.3 fL (ref 80.0–100.0)
Monocytes Absolute: 0.3 10*3/uL (ref 0.1–1.0)
Monocytes Relative: 8 %
Neutro Abs: 2 10*3/uL (ref 1.7–7.7)
Neutrophils Relative %: 58 %
Platelets: 74 10*3/uL — ABNORMAL LOW (ref 150–400)
RBC: 2.85 MIL/uL — ABNORMAL LOW (ref 3.87–5.11)
RDW: 17.2 % — ABNORMAL HIGH (ref 11.5–15.5)
WBC: 3.5 10*3/uL — ABNORMAL LOW (ref 4.0–10.5)
nRBC: 0 % (ref 0.0–0.2)

## 2021-04-03 LAB — COMPREHENSIVE METABOLIC PANEL
ALT: 29 U/L (ref 0–44)
AST: 15 U/L (ref 15–41)
Albumin: 2.2 g/dL — ABNORMAL LOW (ref 3.5–5.0)
Alkaline Phosphatase: 38 U/L (ref 38–126)
Anion gap: 11 (ref 5–15)
BUN: 12 mg/dL (ref 8–23)
CO2: 23 mmol/L (ref 22–32)
Calcium: 8 mg/dL — ABNORMAL LOW (ref 8.9–10.3)
Chloride: 101 mmol/L (ref 98–111)
Creatinine, Ser: 0.45 mg/dL (ref 0.44–1.00)
GFR, Estimated: >60 mL/min (ref >60)
Glucose, Bld: 82 mg/dL (ref 70–99)
Potassium: 3.3 mmol/L — ABNORMAL LOW (ref 3.5–5.1)
Sodium: 135 mmol/L (ref 135–145)
Total Bilirubin: 0.7 mg/dL (ref 0.3–1.2)
Total Protein: 5.3 g/dL — ABNORMAL LOW (ref 6.5–8.1)

## 2021-04-03 LAB — GLUCOSE, CAPILLARY
Glucose-Capillary: 102 mg/dL — ABNORMAL HIGH (ref 70–99)
Glucose-Capillary: 112 mg/dL — ABNORMAL HIGH (ref 70–99)
Glucose-Capillary: 105 mg/dL — ABNORMAL HIGH (ref 70–99)

## 2021-04-03 LAB — URINE CULTURE: Culture: >=100000 — AB

## 2021-04-03 LAB — PHOSPHORUS: Phosphorus: 1.9 mg/dL — ABNORMAL LOW (ref 2.5–4.6)

## 2021-04-03 MED ORDER — KCL IN DEXTROSE-NACL 20-5-0.9 MEQ/L-%-% IV SOLN
INTRAVENOUS | Status: DC
  Filled 2021-04-03 (×8): qty 1000

## 2021-04-03 MED ORDER — SODIUM CHLORIDE 0.9 % IV SOLN
Freq: Once | INTRAVENOUS | Status: AC
Start: 2021-04-03 — End: 2021-04-06

## 2021-04-03 MED ORDER — POTASSIUM PHOSPHATES 15 MMOLE/5ML IV SOLN
20.0000 mmol | Freq: Once | INTRAVENOUS | Status: AC
  Administered 2021-04-03: 20 mmol via INTRAVENOUS
  Filled 2021-04-03: qty 6.67

## 2021-04-03 MED ORDER — POTASSIUM CHLORIDE CRYS ER 20 MEQ PO TBCR
40.0000 meq | EXTENDED_RELEASE_TABLET | Freq: Once | ORAL | Status: AC
  Administered 2021-04-03: 40 meq via ORAL
  Filled 2021-04-03: qty 2

## 2021-04-03 MED ORDER — ENSURE ENLIVE PO LIQD
237.0000 mL | Freq: Three times a day (TID) | ORAL | Status: DC
  Administered 2021-04-03 – 2021-04-28 (×43): 237 mL via ORAL

## 2021-04-03 NOTE — Progress Notes (Signed)
PROGRESS NOTE    Yvette Roberts  VOZ:[id number] DOB: [birth year] DOA: 03/27/2021 PCP: [name], FNP   Brief Narrative:  Patient is a 65 year old African-American female with a past medical history significant for but not limited to hypertension, type 2 diabetes mellitus, stage IV uterine cancer, history malignant pleural effusion diagnosed in February 2022 followed by Dr. Simeon Craft such currently on chemotherapy who had multiple symptoms most notably severe odynophagia the resultant minimal intake and profound weight loss.  She had been prescribed liquid medications for supportive care however unable to get them.  She reports that she lost about 50 to 60 pounds in last 3 months and that her main complaint is severe odynophagia with any attempt at swallowing.  She also admitted to having some mild abdominal discomfort which is somewhat chronic and unchanged.  She underwent further work-up and had an EGD and found nonsevere esophagitis in the proximal and distal esophagus as well as scattered mild inflammation with erythema in the entire stomach.  Medical oncology has been consulted as well and recommending trying Magic mouthwash with lidocaine prior to attempting to eat.  She is on the PPI and sucralfate but is reluctant swallow her own saliva because of the pain.  We cannot safely discharge the patient until she is able to tolerate opioid diet and able to swallow her pills.  The medical oncologist is reluctant to offer her peritoneal nutrition given the high risk of infection in the setting of severe pancytopenia.  We will order a calorie count to see how much that she is actually taking in.  A few nights ago she developed a fever and now she is febrile neutropenic so we will panculture her and start IV cefepime.  Her urine cultured last time to grow out 70,000 colony-forming units of Pseudomonas aeruginosa will cover with IV cefepime and repeat a urinalysis and urine culture. Repeat Urinalysis showed  >100,000 GNR.  Patient continues to have some burning and discomfort in her urine so likely this is the source of her infection and will continue Treatment with IV Cefepime    Her ANC is improved so we will stop Granix tonight.  Patient's urine output has been diminished so we will start D5W normal saline +20 mEQ of KCl 75 MLS per hour  Assessment & Plan:   Principal Problem:   Esophagitis Active Problems:   Type 2 diabetes mellitus without complication (HCC)   Hypertension   Uterine cancer (HCC)   Malignant pleural effusion   Protein-calorie malnutrition, severe (HCC)   Odynophagia  Odynophagia and Esophagitis, improving  GERD -GI was consulted and underwent EGD on 03/29/21 which showed mild nonsevere esophagitis in the proximal and distal esophagus as well as scattered mild inflammation with erythema in the entire stomach but she has profound symptoms of Esophagitis  -Her Bx are pending -Her IV fluconazole has nos been discontinued but continuing Nystatin 500,000 units po 4x Daily  -Her Odynophagia could be related to Esophagitis and medical oncology encouraged her to try and swallow more now that her pain is better controlled; medical oncology recommends trying numbing medicine with Magic mouthwash and lidocaine before attempting to swallow given that the patient is somewhat reluctant to try swallowing -GI recommending continuing Pantoprazole BID and Carafate  1 gram po TIDwm and qHS as well as advancing diet to Soft and Slowly  Advance diet as tolerated  -Patient to follow up with GI in the outpatient setting  -We will order a calorie count given that she  is still not taking much given her fear of swallowing and pain; overnight the nursing states that she is eating a little bit more and tolerating her food -Started IVF with D5 NS + 20 mEQ of KCl at 75 mL/hr  Febrile Neutropenia -Patient had a T-max of 101 on 03/31/21 and she was neutropenic with a WBC of 0.8; Now WBC is 3.5 and ANC is  2.0 -We will start the patient on IV cefepime and panculture and obtain blood cultures x2, urinalysis and urine culture again as well as a chest x-ray -Repeat Urinalysis showing appearance with amber color urine, negative glucose, negative hemoglobin, 5 ketones, negative leukocytes, negative nitrates, rare bacteria, 0-5 RBCs per high-powered field, 0-5 squamous epithelial cells, 0-5 WBCs, and repeat urinalysis and urine culture showing greater than 100,000 colony-forming units of Pseudomonas aeruginosa sensitivities pending -Blood cultures x2 showed no growth to date at 2 Days -Have started G-CSF with Granix 480 mcg sq Daily but will now start since [place] is >1.5 -Follow cultures carefully and have oncology weigh in again  Pancytopenia, -Patient's WBC is now 3.5, Hgb/Hct is now 7.9/246 and Platelet Count is 74 and in the setting of her chemotherapy and infection -Had started G-CSF support as above but will stop -Oncology are also recommending no blood transfusion unless her hemoglobin less than 8 and her platelet count is less than 10 or if she has active bleeding -Will consider giving a unit of blood if she continues to remain below 8 tomorrow -Continue monitor and trend and repeat CBC in a.m.  Pseudomonas Aeruginosa UTI -Check U/A and Urine Cx; initial urinalysis was relatively unremarkable showing moderate hemoglobin, 20 ketones, moderate leukocytes, no bacteria seen, 0-5 squamous epithelial cells, 0-5 WBCs but urine culture is growing 70,000 colony-forming units of Pseudomonas aeruginosa that was pansensitive -Her WBC was 1.0 but she was afebrile yesterday however spiked a temperature last evening of 101 and WBC has dropped to 0.8 -We will repeat urinalysis and urine culture and empirically start treatment with IV cefepime -Repeat urinalysis showed a clear appearance with amber color urine, negative glucose, 5 ketones, negative nitrites, negative leukocytes, rare bacteria, 0-5 squamous epithelial  cells, 0-5 RBCs per high-power field, 0-5 WBCs -Repeat urine culture showing greater than 100,000 colony-forming units of Pseudomonas aeruginosa with sensitivities that are Pan-Sensitive   Decreased Urine Output -Started Fluids as above -Check Bladder Scan   Hypokalemia -K+ is now 3.3 -Replete with IV K-Phos 20 mmol and po KCL 40 mEQ x2 -Continue monitor and replete as necessary -Repeat CMP in a.m.  Hypomagnesemia -The patient magnesium level is now 2.1 -Continue monitor and treat as necessary -Repeat magnesium level in a.m.  Hypophosphatemia -Patient's phosphorus level was is now 1.9 still -Replete with IV K Phos 20 mmol again -Continue to Monitor and Replete As necessary -Repeat Phos Level in the AM   Severe Protein Calorie Malnutrition -Nutritionist was consulted for further evaluation and recommendations however she has not really taken p.o. was yesterday -Estimated body mass index is 27.37 kg/m as calculated from the following:   Height as of 03/22/21: 5\' 6"  (1.676 m).   Weight as of 03/22/21: 76.9 kg.  -Her prealbumin was 16 -She refused a [place] tube feeding -She will need to stay in house until she is able to keep herself nutritionally replete either by p.o. or some other modality which is to be discussed -PO intake improving -Started D5 NS + 20 mEQ KCl at 75 mLhr  Metastatic Uterine Cancer -She underwent a  chest CT of the abdomen pelvis which showed "Decreased loculated pleural fluid and pleural thickening in the chest follows diminishing perihepatic soft tissue and capsular hepatic implants as described.2. No adenopathy by size criteria in the abdomen or pelvis. Minimal soft tissue in the intra-aortocaval groove is all that remains on image 77 of series 2 in the retroperitoneum. 3. Decreased bulk of the uterus subjectively and of RIGHT adnexal structures as described. 4. New areas of bony sclerosis compatible with skeletal metastasis. No signs of increased FDG uptake and  no visible sclerosis on the  previous PET. In the context of improvement of other disease it is possible that this represents treated metastasis. Would suggest attention on follow-up. 5. Loculated RIGHT-sided pleural fluid with signs of pleural thickening in the lower RIGHT chest otherwise with similar appearance. 6. Cholelithiasis without evidence of acute cholecystitis. 7. Aortic atherosclerosis." -Her medical oncologist has reviewed the CT scans and feels that she has had good response to treatment and recommending continuing supportive care -Continue with pain control with Hydromorphone 1 mg IV q3 prn Severe Pain and Gabapentin 200 mg po BID -C/w Antiemetics and Supportive Care with Ondansetron 4 mg po/IV q6hprn Nausea   Malignant Pleural Effusion -CT Scan showed "Loculated RIGHT-sided pleural fluid with 6.5 x 3.6 cm axial dimension as compared to 8.1 x 3.9 cm. Basilar volume loss and signs of pleural thickening with decreased pleural thickening, markedly diminished since the prior PET and with continued decreased pleural thickening particularly in the inferior RIGHT chest even since the recent CT of the chest. Over the RIGHT hemidiaphragm pleural thickness approximately 7 mm as compared to 12 mm greatest thickness. LEFT chest is clear. Airways are patent." with the impression being "Decreased loculated pleural fluid and pleural thickening in the chest follows diminishing perihepatic soft tissue and capsular hepatic implants as described" -No Respiratory Distress   Hypertension -C/w Carvedilol 12.5 mg po BID -Continue to Monitor BP per Protocol  -Last BP reading was 119/81  Diabetes Mellitus Type 2 -Last hemoglobin A1c was 6.1 -Blood sugars ranging from 96-122 on daily BMPs/CMP's -CBGs ranging from 102-145 -continue to monitor blood sugars carefully and if necessary will add sensitive NovoLog sliding scale insulin AC  Anxiety and Depression -C/w Mirtazapine 50 mg p.o. nightly and Fluoxetine  20 mg p.o. daily  Hyperbilirubinemia -Likely reactive -Patient's T bili now gone from 1.4 -> 1.0 -> 0.7 x2 -Continue monitor and trend and repeat CMP in a.m.  DVT prophylaxis: Enoxaparin 40 mg sq q24h Code Status: FULL CODE Family Communication: No family present at bedside  Disposition Plan: Pending further improvement and tolerance of diet and clearance by medical oncology  Status is: Inpatient  Remains inpatient appropriate because:Unsafe d/c plan, IV treatments appropriate due to intensity of illness or inability to take PO and Inpatient level of care appropriate due to severity of illness   Dispo: The patient is from: Home              Anticipated d/c is to: Home              Patient currently is not medically stable to d/c.   Difficult to place patient No  Consultants:   Medical Oncology   Gastroenterology    Procedures:  EGD Findings:      Non-severe esophagitis was found in the proximal and distal esophagus.       Biopsies were taken with a cold forceps for histology.      Scattered mild inflammation characterized by erythema  was found in the       entire examined stomach. Biopsies were taken with a cold forceps for       histology.      The cardia and gastric fundus were normal on retroflexion.      The duodenal bulb, first portion of the duodenum and second portion of       the duodenum were normal. Impression:               - Non-severe esophagitis. Biopsied.                           - Gastritis. Biopsied.                           - Normal duodenal bulb, first portion of the                            duodenum and second portion of the duodenum.  Antimicrobials:  Anti-infectives (From admission, onward)   Start     Dose/Rate Route Frequency Ordered Stop   04/01/21 0930  ceFEPIme (MAXIPIME) 2 g in sodium chloride 0.9 % 100 mL IVPB        2 g 200 mL/hr over 30 Minutes Intravenous Every 8 hours 04/01/21 0839     03/27/21 1800  fluconazole (DIFLUCAN) IVPB  200 mg  Status:  Discontinued        200 mg 100 mL/hr over 60 Minutes Intravenous Daily 03/27/21 1734 03/29/21 1633        Subjective: Seen and examined at bedside and patient was doingi ok. Wanting to rest. No nausea or lightheadedness. Nursing stating her urine output is decreased and Bladder Scan this AM was only 100 mL. No other concerns or complaints at this time.   Objective: Vitals:   04/02/21 0611 04/02/21 1451 04/02/21 2053 04/03/21 0549  BP: 124/72 115/77 124/74 119/81  Pulse: 75 66 74 73  Resp:  16 16 18   Temp: 97.8 F (36.6 C) 97.6 F (36.4 C) 97.9 F (36.6 C) 98.9 F (37.2 C)  TempSrc: Oral Oral Oral Oral  SpO2: 96% 95% 94% 95%    Intake/Output Summary (Last 24 hours) at 04/03/2021 1143 Last data filed at 04/03/2021 1100 Gross per 24 hour  Intake 300 ml  Output 150 ml  Net 150 ml   There were no vitals filed for this visit.  Examination: Physical Exam:  Constitutional: Chronically ill appearing AAF in NAD and appears calm and comfortable Eyes: Lids and conjunctivae normal, sclerae anicteric  ENMT: External Ears, Nose appear normal. Grossly normal hearing. Neck: Appears normal, supple, no cervical masses, normal ROM, no appreciable thyromegaly; no JVD Respiratory: Diminished to auscultation bilaterally, no wheezing, rales, rhonchi or crackles. Normal respiratory effort and patient is not tachypenic. No accessory muscle use. Unlabored breathing and not wearing supplemental O2 via [place] Cardiovascular: RRR, no murmurs / rubs / gallops. S1 and S2 auscultated. Trace Lower Extremity Edema Abdomen: Soft, non-tender, non-distended. Bowel sounds positive.  GU: Deferred. Musculoskeletal: No clubbing / cyanosis of digits/nails. No joint deformity upper and lower extremities.  Skin: No rashes, lesions, ulcers on a limited skin evaluation. No induration; Warm and dry.  Neurologic: CN 2-12 grossly intact with no focal deficits. Romberg sign and cerebellar reflexes not assessed.   Psychiatric: Normal judgment and insight. Alert and oriented x 3. Normal mood and  appropriate affect.    Data Reviewed: I have personally reviewed following labs and imaging studies  CBC: Recent Labs  Lab 03/30/21 0553 03/31/21 0518 04/01/21 0459 04/02/21 0506 04/03/21 0758  WBC 1.1* 1.0* 0.8* 1.2* 3.5*  NEUTROABS  --  0.1*  --  TOO FEW TO COUNT 2.0  HGB 8.2* 8.5* 8.2* 7.8* 7.9*  HCT 25.5* 25.7* 25.3* 23.5* 24.6*  MCV 86.4 86.0 85.8 84.5 86.3  PLT 76* 69* 67* 54* 74*   Basic Metabolic Panel: Recent Labs  Lab 03/29/21 0502 03/29/21 2303 03/30/21 0553 03/31/21 0518 04/01/21 0459 04/02/21 0506 04/02/21 1630 04/03/21 0647  NA 135  --    < > 136 134* 135 135 135  K 2.7* 2.7*   < > 3.1* 3.2* 3.5 3.6 3.3*  CL 94*  --    < > 95* 94* 98 98 101  CO2 31  --    < > 32 30 28 29 23   GLUCOSE 96  --    < > 122* 130* 101* 112* 82  BUN 7*  --    < > <5* 6* 12 14 12   CREATININE 0.42*  --    < > 0.51 0.51 0.51 0.49 0.45  CALCIUM 8.3*  --    < > 8.5* 8.4* 8.1* 8.1* 8.0*  MG 1.6* 1.8  --  1.4* 1.7 2.0  --  2.1  PHOS 2.1*  --   --  2.0* 2.8 1.9*  --  1.9*   < > = values in this interval not displayed.   GFR: Estimated Creatinine Clearance: 73.4 mL/min (by C-G formula based on SCr of 0.45 mg/dL). Liver Function Tests: Recent Labs  Lab 03/31/21 0518 04/01/21 0459 04/02/21 0506 04/02/21 1630 04/03/21 0647  AST 23 22 26 18 15   ALT 38 33 42 37 29  ALKPHOS 41 43 37* 38 38  BILITOT 1.4* 1.4* 1.0 0.7 0.7  PROT 5.8* 5.9* 5.8* 5.9* 5.3*  ALBUMIN 2.6* 2.6* 2.4* 2.4* 2.2*   No results for input(s): LIPASE, AMYLASE in the last 168 hours. No results for input(s): AMMONIA in the last 168 hours. Coagulation Profile: No results for input(s): INR, PROTIME in the last 168 hours. Cardiac Enzymes: No results for input(s): CKTOTAL, CKMB, CKMBINDEX, TROPONINI in the last 168 hours. BNP (last 3 results) No results for input(s): PROBNP in the last 8760 hours. HbA1C: No results for input(s):  HGBA1C in the last 72 hours. CBG: Recent Labs  Lab 04/01/21 0720 04/01/21 1237 04/01/21 1547 04/01/21 2129 04/03/21 0725  GLUCAP 132* 145* 123* 110* 102*   Lipid Profile: No results for input(s): CHOL, HDL, LDLCALC, TRIG, CHOLHDL, LDLDIRECT in the last 72 hours. Thyroid Function Tests: No results for input(s): TSH, T4TOTAL, FREET4, T3FREE, THYROIDAB in the last 72 hours. Anemia Panel: No results for input(s): VITAMINB12, FOLATE, FERRITIN, TIBC, IRON, RETICCTPCT in the last 72 hours. Sepsis Labs: No results for input(s): PROCALCITON, LATICACIDVEN in the last 168 hours.  Recent Results (from the past 240 hour(s))  SARS CORONAVIRUS 2 (TAT 6-24 HRS) Nasopharyngeal Nasopharyngeal Swab     Status: None   Collection Time: 03/28/21  5:20 AM   Specimen: Nasopharyngeal Swab  Result Value Ref Range Status   SARS Coronavirus 2 NEGATIVE NEGATIVE Final    Comment: (NOTE) SARS-CoV-2 target nucleic acids are NOT DETECTED.  The SARS-CoV-2 RNA is generally detectable in upper and lower respiratory specimens during the acute phase of infection. Negative results do not preclude SARS-CoV-2 infection, do not rule out co-infections  with other pathogens, and should not be used as the sole basis for treatment or other patient management decisions. Negative results must be combined with clinical observations, patient history, and epidemiological information. The expected result is Negative.  Fact Sheet for Patients: SugarRoll.be  Fact Sheet for Healthcare Providers: https://www.woods-mathews.com/  This test is not yet approved or cleared by the [place] FDA and  has been authorized for detection and/or diagnosis of SARS-CoV-2 by FDA under an Emergency Use Authorization (EUA). This EUA will remain  in effect (meaning this test can be used) for the duration of the COVID-19 declaration under Se ction 564(b)(1) of the Act, 21 U.S.C. section  360bbb-3(b)(1), unless the authorization is terminated or revoked sooner.  Performed at [facility], [place] [address], [place], [place] [address]   Culture, Urine     Status: Abnormal   Collection Time: 03/30/21 11:55 AM   Specimen: Urine, Clean Catch  Result Value Ref Range Status   Specimen Description   Final    URINE, CLEAN CATCH Performed at [facility], [place] [address], [place], [place] [address]    Special Requests   Final    NONE Performed at [facility], [place] [address] [place], [place] [address]    Culture 70,000 COLONIES/mL PSEUDOMONAS AERUGINOSA (A)  Final   Report Status 04/01/2021 FINAL  Final   Organism ID, Bacteria PSEUDOMONAS AERUGINOSA (A)  Final      Susceptibility   Pseudomonas aeruginosa - MIC*    CEFTAZIDIME 2 SENSITIVE Sensitive     CIPROFLOXACIN <=0.25 SENSITIVE Sensitive     GENTAMICIN 2 SENSITIVE Sensitive     IMIPENEM 2 SENSITIVE Sensitive     PIP/TAZO <=4 SENSITIVE Sensitive     CEFEPIME 2 SENSITIVE Sensitive     * 70,000 COLONIES/mL PSEUDOMONAS AERUGINOSA  Culture, Urine     Status: Abnormal   Collection Time: 04/01/21  8:56 AM   Specimen: Urine, Random  Result Value Ref Range Status   Specimen Description   Final    URINE, RANDOM Performed at [facility], [place] [address] [place], [place] [address]    Special Requests   Final    NONE Performed at [facility], [place] [address] [place], [place] [address]    Culture >=100,000 COLONIES/mL PSEUDOMONAS AERUGINOSA (A)  Final   Report Status 04/03/2021 FINAL  Final   Organism ID, Bacteria PSEUDOMONAS AERUGINOSA (A)  Final      Susceptibility   Pseudomonas aeruginosa - MIC*    CEFTAZIDIME 2 SENSITIVE Sensitive     CIPROFLOXACIN <=0.25 SENSITIVE Sensitive     GENTAMICIN 2 SENSITIVE Sensitive     IMIPENEM 2 SENSITIVE Sensitive     PIP/TAZO 8 SENSITIVE Sensitive     CEFEPIME 2 SENSITIVE Sensitive     * >=100,000  COLONIES/mL PSEUDOMONAS AERUGINOSA  Culture, blood (routine x 2)     Status: None (Preliminary result)   Collection Time: 04/01/21  9:16 AM   Specimen: BLOOD  Result Value Ref Range Status   Specimen Description   Final    BLOOD BLOOD RIGHT HAND Performed at [place] [address], [place], [place] [address]    Special Requests   Final    BOTTLES DRAWN AEROBIC AND ANAEROBIC Blood Culture adequate volume Performed at [place] [address] [place], [place] [address]    Culture   Final    NO GROWTH 2 DAYS Performed at [facility], [place] [address], [place], [place] [address]    Report Status PENDING  Incomplete  Culture, blood (routine x 2)     Status: None (Preliminary result)   Collection Time: 04/01/21 10:38 AM   Specimen: BLOOD  Result Value Ref Range Status   Specimen Description   Final    BLOOD LEFT HAND Performed at [place] [address] [place], [place] [address]    Special Requests   Final    BOTTLES DRAWN AEROBIC ONLY Blood Culture results may not be optimal due to an inadequate volume of blood received in culture bottles Performed at [place] [address], [place], [place] [address]    Culture   Final    NO GROWTH 2 DAYS Performed at [place] [address] [place], [place] [address]    Report Status PENDING  Incomplete    RN Pressure Injury Documentation:     Estimated body mass index is 27.37 kg/m as calculated from the following:   Height as of 03/22/21: 5\' 6"  (1.676 m).   Weight as of 03/22/21: 76.9 kg.  Malnutrition Type: Nutrition Problem: Inadequate oral intake Etiology: cancer and cancer related treatments,decreased appetite (odynophagia) Malnutrition Characteristics: Signs/Symptoms: per patient/family report Nutrition Interventions: Interventions: Ensure Enlive (each supplement provides 350kcal and 20 grams of protein),Refer to RD  note for recommendations  Radiology Studies: No results found. Scheduled Meds: . carvedilol  12.5 mg Oral BID WC  . chlorhexidine  15 mL Mouth Rinse BID  . Chlorhexidine Gluconate Cloth  6 each Topical Daily  . enoxaparin (LOVENOX) injection  40 mg Subcutaneous Q24H  . feeding supplement  237 mL Oral TID BM  . FLUoxetine  20 mg Oral Daily  . gabapentin  200 mg Oral BID  .  HYDROmorphone (DILAUDID) injection  2 mg Intravenous TID  . magic mouthwash w/lidocaine  2 mL Oral TID  . mouth rinse  15 mL Mouth Rinse q12n4p  . melatonin  10 mg Oral QHS  . mirtazapine  15 mg Oral QHS  . multivitamin with minerals  1 tablet Oral Daily  . nystatin  5 mL Oral QID  . pantoprazole sodium  40 mg Oral BID  . polyethylene glycol  17 g Oral Daily  . protein supplement  1 Scoop Oral TID WC  . sucralfate  1 g Oral TID WC & HS   Continuous Infusions: . sodium chloride    . ceFEPime (MAXIPIME) IV 2 g (04/03/21 0855)  . dextrose 5 % and 0.9 % NaCl with KCl 20 mEq/L 75 mL/hr at 04/03/21 2694  . potassium PHOSPHATE IVPB (in mmol) 20 mmol (04/03/21 0959)    LOS: 7 days   Kerney Elbe, DO [facility] PAGER is on [place]  If 7PM-7AM, please contact night-coverage [email or web address]

## 2021-04-03 NOTE — Progress Notes (Addendum)
Yvette Roberts   DOB:12/16/1955   HQ#:[id number]    ASSESSMENT & PLAN:   I have seen the patient, examined her and agree with documentation as follows  Metastatic Uterine cancer ([place]) I have personally reviewed CT imaging She has good response to treatment Continue supportive care: I have cancelled all her future treatment until she is fully recovered   Esophagitis She has profound symptoms of esophagitis Appreciated GI work-up I have reviewed EGD report She will continue antiacid medications as prescribed by GI service Her esophagitis actually got better but she is still not swallowing   Dysphagia This could be related to esophagitis  She will attempt to swallow more now that her pain is better controlled; however, it appears that the patient is somewhat reluctant to attend swallowing whatsoever I have reviewed this with her nursing staff to give her numbing medicine before attempt to swallow This is improved but she is still not swallowing   Cancer associated pain She has severe esophageal pain and right upper quadrant pain  Per patient request, I will schedule dilaudid three times daily, she can continue as needed dilaudid as well Her pain is much improved   Abnormal weight loss She has severe, profound weight loss, initially due to undiagnosed cancer and now inability to swallow with cachexia On February 23, she weighed approximately 223 pounds As of last week, she weighed only 169 pounds I am somewhat reluctant to recommend parenteral nutrition; there is high risk of infection especially in the setting of severe pancytopenia We discussed the risk and benefits of feeding tube placement and the patient declined I warned the patient that she is not likely going to improve with near 0 nutritional intake The patient is attempting to take in more p.o. and calorie count in process I discussed with her the risk and benefits of feeding tube again today  Acquired  pancytopenia Developed fever over the weekend and has been started on Granix She does not need blood transfusion unless hemoglobin is less than 8 or platelet count less than 10 Monitor closely; CBC from today is better. She does not need transfusion support   Severe electrolyte imbalance Due to poor oral intake Continue replacement therapy No urine output overnight and will give normal saline at 75 cc/h x 1 L   Goals of care, counseling/discussion I have reviewed goals of care discussion in the purpose of admission with the patient and her niece and they are both in agreement to be admitted   Discharge planning  She is not ready to be discharged until she can tolerate oral diet and able to swallow her pills I reviewed the plan of care with her son I will not be checking on her tomorrow but will return to check on her on Thursday Please call me if questions arise   Yvette Bussing, NP 04/03/2021 7:29 AM Yvette Lark, MD  Subjective:  Reports ongoing odynophagia which is only minimally improved Continues to try to sip fluid is much she is able She remains on IV pain medication No fevers in the past 24 hours CBC from this morning not yet drawn Nursing reports that she has had no urine output overnight-I&O cath was performed and no urine resulted; nursing requesting order for IV fluid  Objective:  Vitals:   04/02/21 2053 04/03/21 0549  BP: 124/74 119/81  Pulse: 74 73  Resp: 16 18  Temp: 97.9 F (36.6 C) 98.9 F (37.2 C)  SpO2: 94% 95%     Intake/Output  Summary (Last 24 hours) at 04/03/2021 0729 Last data filed at 04/03/2021 0308 Gross per 24 hour  Intake 300 ml  Output 150 ml  Net 150 ml    GENERAL:alert, no distress and comfortable OROPHARYNX: Small blisters around her lips but otherwise no mucositis or thrush NEURO: alert & oriented x 3 with fluent speech, no focal motor/sensory deficits   Labs:  Recent Labs    11/23/20 0324 11/23/20 0421 04/02/21 0506 04/02/21 1630  04/03/21 0647  NA 132*   < > 135 135 135  K 4.5   < > 3.5 3.6 3.3*  CL 96*   < > 98 98 101  CO2 22   < > 28 29 23   GLUCOSE 212*   < > 101* 112* 82  BUN 21   < > 12 14 12   CREATININE 1.28*   < > 0.51 0.49 0.45  CALCIUM 9.5   < > 8.1* 8.1* 8.0*  GFRNONAA 46*   < > >60 >60 >60  PROT 8.2*   < > 5.8* 5.9* 5.3*  ALBUMIN 3.4*   < > 2.4* 2.4* 2.2*  AST 23   < > 26 18 15   ALT 23   < > 42 37 29  ALKPHOS 63   < > 37* 38 38  BILITOT 0.9   < > 1.0 0.7 0.7  BILIDIR 0.2  --   --   --   --   IBILI 0.7  --   --   --   --    < > = values in this interval not displayed.    Studies:  DG Chest 2 View  Result Date: 03/27/2021 CLINICAL DATA:  65 year old female with hypertension and diabetes. Stage IV uterine cancer. EXAM: CHEST - 2 VIEW COMPARISON:  Chest radiograph dated 02/25/2021. FINDINGS: Right-sided Port-A-Cath similar position. Small right pleural effusion similar to prior radiograph. Interval decrease in the size of the opacity seen in the right mid lung field. The left lung is clear. No pneumothorax. Stable cardiomediastinal silhouette. Degenerative changes of the spine. No acute osseous pathology. IMPRESSION: Decrease in the size of opacity in the right mid lung field compared to prior radiograph. Unchanged small right pleural effusion. Electronically Signed   By: Anner Crete M.D.   On: 03/27/2021 19:23   CT CHEST ABDOMEN PELVIS W CONTRAST  Result Date: 03/28/2021 CLINICAL DATA:  Ovarian cancer assess treatment response. EXAM: CT CHEST, ABDOMEN, AND PELVIS WITH CONTRAST TECHNIQUE: Multidetector CT imaging of the chest, abdomen and pelvis was performed following the standard protocol during bolus administration of intravenous contrast. CONTRAST:  169mL OMNIPAQUE IOHEXOL 300 MG/ML  SOLN COMPARISON:  PET-CT from December 08, 2020 and CT of the chest of Feb 25, 2021. FINDINGS: CT CHEST FINDINGS Cardiovascular: RIGHT-sided Port-A-Cath terminates in the lower RIGHT atrium similar to recent CT imaging,  perhaps influenced by arm position as on the chest x-ray the tip is at the caval to atrial junction. Heart size is normal without substantial pericardial effusion. Normal caliber of the thoracic aorta. Normal caliber of the central pulmonary vasculature. Mediastinum/Nodes: No thoracic inlet adenopathy. No axillary adenopathy. No mediastinal lymphadenopathy. Esophagus mildly patulous. Lungs/Pleura: Loculated RIGHT-sided pleural fluid with 6.5 x 3.6 cm axial dimension as compared to 8.1 x 3.9 cm. Basilar volume loss and signs of pleural thickening with decreased pleural thickening, markedly diminished since the prior PET and with continued decreased pleural thickening particularly in the inferior RIGHT chest even since the recent CT of the chest. Over the  RIGHT hemidiaphragm pleural thickness approximately 7 mm as compared to 12 mm greatest thickness. LEFT chest is clear. Airways are patent. Musculoskeletal: See below for full musculoskeletal details. CT ABDOMEN PELVIS FINDINGS Hepatobiliary: Low-density lesions along the cephalad margin of the RIGHT hemi liver and across the top of the RIGHT hemidiaphragm and diminished in size considerably compared to the PET exam of February of 2022. Pleural thickening across the surface of the liver also with decreased thickness (image 47/2) 7 mm greatest thickness as compared to 14 mm greatest thickness. Area of low attenuation along the capsule of the liver (image 45/2) overlying hepatic subsegment Roman numeral 8 measuring 18 mm as compared to 20 mm on the most recent comparison and is much as 43 mm on the prior CT evaluation. Capsular implant with extension into hepatic parenchyma much less conspicuous today than on the previous PET-CT from February and the CT from Feb 25, 2021. This area measuring approximately 1.8 as compared to 2.4 cm based on comparison with the most recent study. Much smaller than on the study of February of 2022 where it measured approximately 41 mm.  Cystic area along the gallbladder fossa and in the inferior aspect of the RIGHT hemi liver with stable appearance largest area measuring 3 cm. Portal vein is patent. Hepatic veins are patent. Cholelithiasis without pericholecystic stranding. No sign of biliary duct distension. Pancreas: Normal, without mass, inflammation or ductal dilatation. Spleen: Spleen normal size and contour. Adrenals/Urinary Tract: Adrenal glands are normal. Symmetric renal enhancement. No hydronephrosis. Smooth contour the urinary bladder. Stomach/Bowel: No acute gastrointestinal process soft tissue adjacent to the appendix in the RIGHT lower quadrant without signs of adjacent stranding is an unchanged caliber of the appendix dating back to February 2021 in the setting of known peritoneal disease with diminished fluid in the pelvis and omental and serosal nodularity throughout the abdomen. Vascular/Lymphatic: Normal caliber abdominal aorta and IVC. There is no gastrohepatic or hepatoduodenal ligament lymphadenopathy. No retroperitoneal or mesenteric lymphadenopathy. Reproductive: Decreased bulky appearance of the uterus. RIGHT adnexal cystic and solid area measuring 4.5 x 2.5 cm as compared to 5.9 x 3.1 cm. Decreased ascites in the pelvis. Other: Decreased ascites as above. Decreased signs of omental nodularity and serosal nodularity. Liver capsular disease as described above. Discrete nodules in the LEFT omentum persist, largest approximately 6 mm (image 88/2) previously 6-7 mm. Focal thickening of the LEFT rectus muscle is diminished no measurable lesion in this area, thickness of the LEFT rectus muscle on image 103 of series 2 is 13 mm as compared to 24 mm. The adjacent omental and peritoneal nodularity and stranding is subjectively diminished since the prior study. Musculoskeletal: Spinal degenerative changes. New sclerotic focus in the RIGHT proximal femur in the femoral neck and head junction measuring 14 mm. New sclerotic focus at the  T12 level (image 60/2) 13 mm. Sclerosis at the T12 level is present however on the study of May of 2022. Not seen on the PET exam of February 2022. Spinal degenerative changes. IMPRESSION: 1. Decreased loculated pleural fluid and pleural thickening in the chest follows diminishing perihepatic soft tissue and capsular hepatic implants as described. 2. No adenopathy by size criteria in the abdomen or pelvis. Minimal soft tissue in the intra-aortocaval groove is all that remains on image 77 of series 2 in the retroperitoneum. 3. Decreased bulk of the uterus subjectively and of RIGHT adnexal structures as described. 4. New areas of bony sclerosis compatible with skeletal metastasis. No signs of increased FDG uptake and  no visible sclerosis on the previous PET. In the context of improvement of other disease it is possible that this represents treated metastasis. Would suggest attention on follow-up. 5. Loculated RIGHT-sided pleural fluid with signs of pleural thickening in the lower RIGHT chest otherwise with similar appearance. 6. Cholelithiasis without evidence of acute cholecystitis. 7. Aortic atherosclerosis. Electronically Signed   By: Zetta Bills M.D.   On: 03/28/2021 15:52   DG CHEST PORT 1 VIEW  Result Date: 04/01/2021 CLINICAL DATA:  Neutropenic fever EXAM: PORTABLE CHEST 1 VIEW COMPARISON:  March 31, 2021 FINDINGS: Stable small right-sided loculated pleural effusion. Hazy opacification of the right hemithorax with fluid in the minor fissure likely representing a small free-flowing pleural effusion. Atelectasis in the right lung base. Left lung is clear. Right chest wall Port-A-Cath with tip overlying the right atrium. The heart size and mediastinal contours are unchanged. Thoracic spondylosis with degenerative changes shoulders. IMPRESSION: Stable right-sided pleural fluid with adjacent atelectasis. Left lung is clear. Electronically Signed   By: Dahlia Bailiff MD   On: 04/01/2021 11:37   DG CHEST PORT 1  VIEW  Result Date: 03/31/2021 CLINICAL DATA:  Shortness of breath.  History of ovarian carcinoma EXAM: PORTABLE CHEST 1 VIEW COMPARISON:  Chest radiograph Mar 27, 2021; chest CT March 28, 2021 FINDINGS: Apparent loculated pleural effusion on the right remains. There is also an apparent free-flowing small right pleural effusion, not appreciably changed from recent CT. Atelectasis right base. Left lung clear. Heart is upper normal in size with pulmonary vascularity normal. Port-A-Cath tip is in the right atrium slightly beyond the superior vena cava-right atrium junction. No adenopathy appreciable. Degenerative change in each shoulder noted. No pneumothorax. IMPRESSION: Pleural effusion on the right, primarily loculated, essentially stable compared to recent CT examination. Right base atelectasis noted. Left lung clear. No new opacity evident. Stable cardiac silhouette. Port-A-Cath present on the right. Electronically Signed   By: Lowella Grip III M.D.   On: 03/31/2021 08:12

## 2021-04-03 NOTE — Progress Notes (Signed)
Patient has not had any urine output for 5 hours. Patient has been straight cath with an output of 600 mL.

## 2021-04-03 NOTE — Progress Notes (Signed)
Nutrition Follow-up  INTERVENTION:   -Continue Calorie Count  -Requires a lot of encouragement to eat  -Ensure Enlive po TID, each supplement provides 350 kcal and 20 grams of protein  -Beneprotein TID with meals, each provides 25 kcals and 6g protein  If patient cannot improve intakes, need to consider feeding tube again or have palliative care consult.  NUTRITION DIAGNOSIS:   Inadequate oral intake related to cancer and cancer related treatments,decreased appetite (odynophagia) as evidenced by per patient/family report.  Ongoing.  GOAL:   Patient will meet greater than or equal to 90% of their needs  Not meeting.  MONITOR:   PO intake,Supplement acceptance,Labs,Weight trends,I & O's  ASSESSMENT:   65 year old African-American female with a past medical history significant for but not limited to hypertension, type 2 diabetes mellitus, stage IV uterine cancer, history malignant pleural effusion diagnosed in February 2022 followed by Dr. Simeon Craft such currently on chemotherapy who had multiple symptoms most notably severe odynophagia the resultant minimal intake and profound weight loss.  Calorie Count 6/6 results: B: 0% L: 30 kcals D: 30 kcals, 1g protein Supplements: 0% Total: 60 kcals, 1g protein  Pt reports having some nausea yesterday. Ate very little of each of her meal trays. Pt did not drink any Ensure -didn't receive any but states the chocolate was too "chocolatey". Pt agreed to try a strawberry one today and requests they be room temperature as she cannot do well with cold liquids right now.   Per tech, pt has had no urinary output and hasn't been drinking liquids. Ate toast and bacon this morning.   Pt denies any pain with swallowing currently. States her pain is well controlled. Agreed to try to eat more of meals and drink supplements.  If patient cannot improve intakes, need to consider feeding tube again or have palliative care consult.  Still needs a  weight for this admission. Per tech, pt is stating she cannot get up.  Medications: Magic mouthwash w/ lidocaine, Remeron, Multivitamin with minerals daily, Miralax, KLOR-CON, Carafate, D5 infusion, K-Phos  Labs reviewed: CBGs: 105-110 Low K, Phos  Diet Order:   Diet Order            DIET SOFT Room service appropriate? Yes; Fluid consistency: Thin  Diet effective now                 EDUCATION NEEDS:   No education needs have been identified at this time  Skin:  Skin Assessment: Reviewed RN Assessment  Last BM:  6/4  Height:   Ht Readings from Last 1 Encounters:  03/22/21 5\' 6"  (1.676 m)    Weight:   Wt Readings from Last 1 Encounters:  03/22/21 76.9 kg   BMI:  There is no height or weight on file to calculate BMI.  Estimated Nutritional Needs:   Kcal:  2100-2300  Protein:  105-115g  Fluid:  2.1L/day  Clayton Bibles, MS, RD, LDN Inpatient Clinical Dietitian Contact information available via Amion

## 2021-04-04 ENCOUNTER — Telehealth: Payer: Self-pay | Admitting: *Deleted

## 2021-04-04 LAB — GLUCOSE, CAPILLARY
Glucose-Capillary: 109 mg/dL — ABNORMAL HIGH (ref 70–99)
Glucose-Capillary: 133 mg/dL — ABNORMAL HIGH (ref 70–99)
Glucose-Capillary: 136 mg/dL — ABNORMAL HIGH (ref 70–99)
Glucose-Capillary: 129 mg/dL — ABNORMAL HIGH (ref 70–99)

## 2021-04-04 LAB — COMPREHENSIVE METABOLIC PANEL
ALT: 32 U/L (ref 0–44)
AST: 28 U/L (ref 15–41)
Albumin: 2.2 g/dL — ABNORMAL LOW (ref 3.5–5.0)
Alkaline Phosphatase: 42 U/L (ref 38–126)
Anion gap: 7 (ref 5–15)
BUN: 10 mg/dL (ref 8–23)
CO2: 29 mmol/L (ref 22–32)
Calcium: 8.5 mg/dL — ABNORMAL LOW (ref 8.9–10.3)
Chloride: 103 mmol/L (ref 98–111)
Creatinine, Ser: 0.49 mg/dL (ref 0.44–1.00)
GFR, Estimated: >60 mL/min (ref >60)
Glucose, Bld: 125 mg/dL — ABNORMAL HIGH (ref 70–99)
Potassium: 3.5 mmol/L (ref 3.5–5.1)
Sodium: 139 mmol/L (ref 135–145)
Total Bilirubin: 0.5 mg/dL (ref 0.3–1.2)
Total Protein: 5.6 g/dL — ABNORMAL LOW (ref 6.5–8.1)

## 2021-04-04 LAB — CBC WITH DIFFERENTIAL/PLATELET
Abs Immature Granulocytes: 0.21 10*3/uL — ABNORMAL HIGH (ref 0.00–0.07)
Basophils Absolute: 0 10*3/uL (ref 0.0–0.1)
Basophils Relative: 1 %
Eosinophils Absolute: 0 10*3/uL (ref 0.0–0.5)
Eosinophils Relative: 0 %
HCT: 24.6 % — ABNORMAL LOW (ref 36.0–46.0)
Hemoglobin: 7.9 g/dL — ABNORMAL LOW (ref 12.0–15.0)
Immature Granulocytes: 4 %
Lymphocytes Relative: 24 %
Lymphs Abs: 1.3 10*3/uL (ref 0.7–4.0)
MCH: 27.7 pg (ref 26.0–34.0)
MCHC: 32.1 g/dL (ref 30.0–36.0)
MCV: 86.3 fL (ref 80.0–100.0)
Monocytes Absolute: 0.3 10*3/uL (ref 0.1–1.0)
Monocytes Relative: 5 %
Neutro Abs: 3.4 10*3/uL (ref 1.7–7.7)
Neutrophils Relative %: 66 %
Platelets: 74 10*3/uL — ABNORMAL LOW (ref 150–400)
RBC: 2.85 MIL/uL — ABNORMAL LOW (ref 3.87–5.11)
RDW: 17.3 % — ABNORMAL HIGH (ref 11.5–15.5)
WBC: 5.2 10*3/uL (ref 4.0–10.5)
nRBC: 0 % (ref 0.0–0.2)

## 2021-04-04 LAB — MAGNESIUM: Magnesium: 1.6 mg/dL — ABNORMAL LOW (ref 1.7–2.4)

## 2021-04-04 LAB — PHOSPHORUS: Phosphorus: 1.7 mg/dL — ABNORMAL LOW (ref 2.5–4.6)

## 2021-04-04 MED ORDER — ONDANSETRON HCL 4 MG/2ML IJ SOLN: 4.0000 mg | Freq: Four times a day (QID) | INTRAMUSCULAR | Status: DC | PRN

## 2021-04-04 MED ORDER — BISACODYL 10 MG RE SUPP
10.0000 mg | Freq: Once | RECTAL | Status: AC
  Administered 2021-04-04: 10 mg via RECTAL
  Filled 2021-04-04: qty 1

## 2021-04-04 MED ORDER — OXYCODONE HCL 5 MG PO TABS
10.0000 mg | ORAL_TABLET | Freq: Four times a day (QID) | ORAL | Status: DC | PRN
  Administered 2021-04-04 – 2021-04-28 (×27): 10 mg via ORAL
  Filled 2021-04-04 (×27): qty 2

## 2021-04-04 MED ORDER — CHLORHEXIDINE GLUCONATE 0.12 % MT SOLN
15.0000 mL | Freq: Three times a day (TID) | OROMUCOSAL | Status: DC
  Administered 2021-04-04 – 2021-04-05 (×3): 15 mL via OROMUCOSAL
  Filled 2021-04-04 (×3): qty 15

## 2021-04-04 MED ORDER — FENTANYL 12 MCG/HR TD PT72
1.0000 | MEDICATED_PATCH | TRANSDERMAL | Status: DC
  Administered 2021-04-04: 1 via TRANSDERMAL
  Filled 2021-04-04: qty 1

## 2021-04-04 MED ORDER — MAGNESIUM SULFATE 2 GM/50ML IV SOLN
2.0000 g | Freq: Once | INTRAVENOUS | Status: AC
  Administered 2021-04-04: 2 g via INTRAVENOUS
  Filled 2021-04-04: qty 50

## 2021-04-04 MED ORDER — POTASSIUM PHOSPHATES 15 MMOLE/5ML IV SOLN
30.0000 mmol | Freq: Once | INTRAVENOUS | Status: AC
  Administered 2021-04-04: 30 mmol via INTRAVENOUS
  Filled 2021-04-04: qty 10

## 2021-04-04 NOTE — Progress Notes (Signed)
Nutrition Follow-up  INTERVENTION:   -Recommend placement of feeding tube vs palliative care consult  -Continue Ensure Enlive po TID, each supplement provides 350 kcal and 20 grams of protein  -Beneprotein TID with meals, each provides 25 kcals and 6g protein  TF recommendations: -Osmolite 1.5 @ 20 ml/hr, advance by 10 ml every 12 hours to goal rate of 65 ml/hr. -Provides 2340 kcals, 97g protein and 1188 ml H2O  NUTRITION DIAGNOSIS:   Inadequate oral intake related to cancer and cancer related treatments,decreased appetite (odynophagia) as evidenced by per patient/family report.  Ongoing.  GOAL:   Patient will meet greater than or equal to 90% of their needs  Not meeting.  MONITOR:   PO intake,Supplement acceptance,Labs,Weight trends,I & O's  ASSESSMENT:   65 year old African-American female with a past medical history significant for but not limited to hypertension, type 2 diabetes mellitus, stage IV uterine cancer, history malignant pleural effusion diagnosed in February 2022 followed by Dr. Simeon Craft such currently on chemotherapy who had multiple symptoms most notably severe odynophagia the resultant minimal intake and profound weight loss.  6/7 Calorie Count results: B: 55 kcals, 1g protein L: 100 kcals, 5g protein D: 0% Supplements: 150 kcals, 10g protein Total: 305 kcals (14% of needs), 16g protein (15% of needs)  So far today, pt only consumed 10% of breakfast and has had no supplements.  Patient not meeting needs and does not seem to be making progress. Recommend placement of feeding tube for nutrition support if within [place].   Still no weight for this admission.  Medications: Magic mouthwash w/ lidocaine, Remeron, Multivitamin with minerals daily, Carafate, D5 infusion   Labs reviewed: CBGs: 105-136 Low Phos, Mg  Diet Order:   Diet Order            DIET SOFT Room service appropriate? Yes; Fluid consistency: Thin  Diet effective now                  EDUCATION NEEDS:   No education needs have been identified at this time  Skin:  Skin Assessment: Reviewed RN Assessment  Last BM:  6/4  Height:   Ht Readings from Last 1 Encounters:  03/22/21 5\' 6"  (1.676 m)    Weight:   Wt Readings from Last 1 Encounters:  03/22/21 76.9 kg    BMI:  There is no height or weight on file to calculate BMI.  Estimated Nutritional Needs:   Kcal:  2100-2300  Protein:  105-115g  Fluid:  2.1L/day  Clayton Bibles, MS, RD, LDN Inpatient Clinical Dietitian Contact information available via Amion

## 2021-04-04 NOTE — Progress Notes (Addendum)
PROGRESS NOTE    Yvette Roberts  XVQ:[id number] DOB: May 04, 1956 DOA: 03/27/2021 PCP: [name], FNP   Brief Narrative:65 year old African-American female with a past medical history significant for but not limited to hypertension, type 2 diabetes mellitus, stage IV uterine cancer, history malignant pleural effusion diagnosed in February 2022 followed by Dr. Simeon Craft such currently on chemotherapy who had multiple symptoms most notably severe odynophagia the resultant minimal intake and profound weight loss.  She had been prescribed liquid medications for supportive care however unable to get them.  She reports that she lost about 50 to 60 pounds in last 3 months and that her main complaint is severe odynophagia with any attempt at swallowing.  She also admitted to having some mild abdominal discomfort which is somewhat chronic and unchanged.  She underwent further work-up and had an EGD and found nonsevere esophagitis in the proximal and distal esophagus as well as scattered mild inflammation with erythema in the entire stomach.  Medical oncology has been consulted as well and recommending trying Magic mouthwash with lidocaine prior to attempting to eat.  She is on the PPI and sucralfate but is reluctant swallow her own saliva because of the pain.  We cannot safely discharge the patient until she is able to tolerate opioid diet and able to swallow her pills.  The medical oncologist is reluctant to offer her peritoneal nutrition given the high risk of infection in the setting of severe pancytopenia.  We will order a calorie count to see how much that she is actually taking in.  A few nights ago she developed a fever and now she is febrile neutropenic so we will panculture her and start IV cefepime.  Her urine cultured last time to grow out 70,000 colony-forming units of Pseudomonas aeruginosa will cover with IV cefepime and repeat a urinalysis and urine culture. Repeat Urinalysis showed >100,000 GNR.   Patient continues to have some burning and discomfort in her urine so likely this is the source of her infection and will continue Treatment with IV Cefepime    Her ANC is improved so we will stop Granix tonight.  Patient's urine output has been diminished so we will start D5W normal saline +20 mEQ of KCl 75 MLS per hour Assessment & Plan:   Principal Problem:   Esophagitis Active Problems:   Type 2 diabetes mellitus without complication (HCC)   Hypertension   Uterine cancer (HCC)   Malignant pleural effusion   Protein-calorie malnutrition, severe (HCC)   Odynophagia   Decreased urine output   Odynophagia and Esophagitis, improving  GERD -GI was consulted and underwent EGD on 03/29/21 which showed mild nonsevere esophagitis in the proximal and distal esophagus as well as scattered mild inflammation with erythema in the entire stomach but she has profound symptoms of Esophagitis  -Her Bx are pending -Continue nystatin add chlorhexidine gluconate, continue Magic mouthwash and lidocaine prior to eating. Started a fentanyl patch 04/04/2021 Stopped IV Dilaudid and increased oxycodone to 10 mg every 6 as needed  -GI recommending continuing Pantoprazole BID and Carafate  1 gram po TIDwm and qHS as well as advancing diet to Soft and Slowly  Advance diet as tolerated  -Patient to follow up with GI in the outpatient setting  -We will order a calorie count given that she is still not taking much given her fear of swallowing and pain; overnight the nursing states that she is eating a little bit more and tolerating her food -Started IVF with D5 NS +  20 mEQ of KCl at 75 mL/hr  Febrile Neutropenia -Patient had a T-max of 101 on 03/31/21 and she was neutropenic with a WBC of 0.8; Now WBC is 3.5 and ANC is 2.0 No further fevers noted. Patient has been started on cefepime and panculture has been done again. -  Pancytopenia, -Patient's WBC is now 3.5, Hgb/Hct is now 7.9/246 and Platelet Count is 74 and  in the setting of her chemotherapy and infection -Ha-Oncology are also recommending no blood transfusion unless her hemoglobin less than 8 and her platelet count is less than 10 or if she has active bleeding --Continue monitor and trend and repeat CBC in a.m.  Pseudomonas Aeruginosa UTI continue cefepime.     Hypokalemia potassium 3.5 with repletion  Hypomagnesemia -The patient magnesium level is now 2.1 Hypophosphatemia -Patient's phosphorus level was is now 1.7 still -Replete with IV K Phos 20 mmol again -Continue to Monitor and Replete As necessary -Repeat Phos Level in the AM   Severe Protein Calorie Malnutrition -Nutritionist was consulted for further evaluation and recommendations however she has not really taken p.o. was yesterday -Estimated body mass index is 27.37 kg/m as calculated from the following:   Height as of 03/22/21: 5\' 6"  (1.676 m).   Weight as of 03/22/21: 76.9 kg.  -Her prealbumin was 16 -She refused a [place] tube feeding -She will need to stay in house until she is able to keep herself nutritionally replete either by p.o. or some other modality which is to be discussed -PO intake improving -Started D5 NS + 20 mEQ KCl at 75 mLhr  Metastatic Uterine Cancer -She underwent a chest CT of the abdomen pelvis which showed "Decreased loculated pleural fluid and pleural thickening in the chest follows diminishing perihepatic soft tissue and capsular hepatic implants as described.2. No adenopathy by size criteria in the abdomen or pelvis. Minimal soft tissue in the intra-aortocaval groove is all that remains on image 77 of series 2 in the retroperitoneum. 3. Decreased bulk of the uterus subjectively and of RIGHT adnexal structures as described. 4. New areas of bony sclerosis compatible with skeletal metastasis. No signs of increased FDG uptake and no visible sclerosis on the  previous PET. In the context of improvement of other disease it is possible that this represents  treated metastasis. Would suggest attention on follow-up. 5. Loculated RIGHT-sided pleural fluid with signs of pleural thickening in the lower RIGHT chest otherwise with similar appearance. 6. Cholelithiasis without evidence of acute cholecystitis. 7. Aortic atherosclerosis." -Her medical oncologist has reviewed the CT scans and feels that she has had good response to treatment and recommending continuing supportive care -Continue with pain control with Hydromorphone 1 mg IV q3 prn Severe Pain and Gabapentin 200 mg po BID -C/w Antiemetics and Supportive Care with Ondansetron 4 mg po/IV q6hprn Nausea   Malignant Pleural Effusion -CT Scan showed "Loculated RIGHT-sided pleural fluid with 6.5 x 3.6 cm axial dimension as compared to 8.1 x 3.9 cm. Basilar volume loss and signs of pleural thickening with decreased pleural thickening, markedly diminished since the prior PET and with continued decreased pleural thickening particularly in the inferior RIGHT chest even since the recent CT of the chest. Over the RIGHT hemidiaphragm pleural thickness approximately 7 mm as compared to 12 mm greatest thickness. LEFT chest is clear. Airways are patent." with the impression being "Decreased loculated pleural fluid and pleural thickening in the chest follows diminishing perihepatic soft tissue and capsular hepatic implants as described" -No Respiratory Distress  Hypertension -C/w Carvedilol 12.5 mg po BID -Continue to Monitor BP per Protocol  -Last BP reading was 138/90  Diabetes Mellitus Type 2 -Last hemoglobin A1c was 6.1 -Blood sugars ranging from 96-122 on daily BMPs/CMP's -CBGs ranging from 102-145 -continue to monitor blood sugars carefully and if necessary will add sensitive NovoLog sliding scale insulin AC  Anxiety and Depression -C/w Mirtazapine 50 mg p.o. nightly and Fluoxetine 20 mg p.o. daily  Nutrition Problem: Inadequate oral intake Etiology: cancer and cancer related treatments,decreased  appetite (odynophagia)     Signs/Symptoms: per patient/family report    Interventions: Ensure Enlive (each supplement provides 350kcal and 20 grams of protein),Refer to RD note for recommendations  Estimated body mass index is 27.37 kg/m as calculated from the following:   Height as of 03/22/21: 5\' 6"  (1.676 m).   Weight as of 03/22/21: 76.9 kg.  DVT prophylaxis: Enoxaparin  Code Status: FULL CODE Family Communication: No family present at bedside  Disposition Plan: Pending further improvement and tolerance of diet and clearance by medical oncology  Status is: Inpatient  Remains inpatient appropriate because:Unsafe d/c plan, IV treatments appropriate due to intensity of illness or inability to take PO and Inpatient level of care appropriate due to severity of illness   Dispo: The patient is from: Home  Anticipated d/c is to: Home  Patient currently is not medically stable to d/c.              Difficult to place patient No  Consultants:   Medical Oncology   Gastroenterology    Subjective: Initially when I saw her she was resting in bed breakfast had just come into the room she had not eaten yet however she thought her swallow was better Later when I saw her staff was transferring her from chair back to bed She reported her belly is bigger than yesterday and she had not had a bowel movement She did report her swallowing is better and less painful compared to yesterday and was asking for p.o. pain medications instead of IV narcotics.  Objective: Vitals:   04/03/21 0549 04/03/21 1303 04/03/21 2312 04/04/21 0502  BP: 119/81 122/78 123/74 138/90  Pulse: 73 73 75 78  Resp: 18 (!) 22 17 17   Temp: 98.9 F (37.2 C) 98.4 F (36.9 C) 98.6 F (37 C) 97.7 F (36.5 C)  TempSrc: Oral Oral Oral Oral  SpO2: 95% 93% 95% 96%    Intake/Output Summary (Last 24 hours) at 04/04/2021 1648 Last data filed at 04/04/2021 1416 Gross per 24 hour  Intake 2729.91  ml  Output 900 ml  Net 1829.91 ml   There were no vitals filed for this visit.  Examination:  General exam: Appears calm and comfortable, port in right upper chest Respiratory system: Clear to auscultation. Respiratory effort normal. Cardiovascular system: S1 & S2 heard, RRR. No JVD, murmurs, rubs, gallops or clicks. No pedal edema. Gastrointestinal system: Abdomen is distended, soft and nontender. No organomegaly or masses felt. Normal bowel sounds heard. Central nervous system: Alert and oriented. No focal neurological deficits. Extremities: Symmetric 5 x 5 power. Skin: No rashes, lesions or ulcers Psychiatry: Judgement and insight appear normal. Mood & affect appropriate.     Data Reviewed: I have personally reviewed following labs and imaging studies  CBC: Recent Labs  Lab 03/31/21 0518 04/01/21 0459 04/02/21 0506 04/03/21 0758 04/04/21 0500  WBC 1.0* 0.8* 1.2* 3.5* 5.2  NEUTROABS 0.1*  --  TOO FEW TO COUNT 2.0 3.4  HGB 8.5* 8.2* 7.8*  7.9* 7.9*  HCT 25.7* 25.3* 23.5* 24.6* 24.6*  MCV 86.0 85.8 84.5 86.3 86.3  PLT 69* 67* 54* 74* 74*   Basic Metabolic Panel: Recent Labs  Lab 03/31/21 0518 04/01/21 0459 04/02/21 0506 04/02/21 1630 04/03/21 0647 04/04/21 0500  NA 136 134* 135 135 135 139  K 3.1* 3.2* 3.5 3.6 3.3* 3.5  CL 95* 94* 98 98 101 103  CO2 32 30 28 29 23 29   GLUCOSE 122* 130* 101* 112* 82 125*  BUN <5* 6* 12 14 12 10   CREATININE 0.51 0.51 0.51 0.49 0.45 0.49  CALCIUM 8.5* 8.4* 8.1* 8.1* 8.0* 8.5*  MG 1.4* 1.7 2.0  --  2.1 1.6*  PHOS 2.0* 2.8 1.9*  --  1.9* 1.7*   GFR: Estimated Creatinine Clearance: 73.4 mL/min (by C-G formula based on SCr of 0.49 mg/dL). Liver Function Tests: Recent Labs  Lab 04/01/21 0459 04/02/21 0506 04/02/21 1630 04/03/21 0647 04/04/21 0500  AST 22 26 18 15 28   ALT 33 42 37 29 32  ALKPHOS 43 37* 38 38 42  BILITOT 1.4* 1.0 0.7 0.7 0.5  PROT 5.9* 5.8* 5.9* 5.3* 5.6*  ALBUMIN 2.6* 2.4* 2.4* 2.2* 2.2*   No results for  input(s): LIPASE, AMYLASE in the last 168 hours. No results for input(s): AMMONIA in the last 168 hours. Coagulation Profile: No results for input(s): INR, PROTIME in the last 168 hours. Cardiac Enzymes: No results for input(s): CKTOTAL, CKMB, CKMBINDEX, TROPONINI in the last 168 hours. BNP (last 3 results) No results for input(s): PROBNP in the last 8760 hours. HbA1C: No results for input(s): HGBA1C in the last 72 hours. CBG: Recent Labs  Lab 04/03/21 1157 04/03/21 1629 04/03/21 2314 04/04/21 0720 04/04/21 1157  GLUCAP 112* 105* 109* 133* 136*   Lipid Profile: No results for input(s): CHOL, HDL, LDLCALC, TRIG, CHOLHDL, LDLDIRECT in the last 72 hours. Thyroid Function Tests: No results for input(s): TSH, T4TOTAL, FREET4, T3FREE, THYROIDAB in the last 72 hours. Anemia Panel: No results for input(s): VITAMINB12, FOLATE, FERRITIN, TIBC, IRON, RETICCTPCT in the last 72 hours. Sepsis Labs: No results for input(s): PROCALCITON, LATICACIDVEN in the last 168 hours.  Recent Results (from the past 240 hour(s))  SARS CORONAVIRUS 2 (TAT 6-24 HRS) Nasopharyngeal Nasopharyngeal Swab     Status: None   Collection Time: 03/28/21  5:20 AM   Specimen: Nasopharyngeal Swab  Result Value Ref Range Status   SARS Coronavirus 2 NEGATIVE NEGATIVE Final    Comment: (NOTE) SARS-CoV-2 target nucleic acids are NOT DETECTED.  The SARS-CoV-2 RNA is generally detectable in upper and lower respiratory specimens during the acute phase of infection. Negative results do not preclude SARS-CoV-2 infection, do not rule out co-infections with other pathogens, and should not be used as the sole basis for treatment or other patient management decisions. Negative results must be combined with clinical observations, patient history, and epidemiological information. The expected result is Negative.  Fact Sheet for Patients: SugarRoll.be  Fact Sheet for Healthcare  Providers: https://www.woods[employer] [employer]ach.com/  This test is not yet approved or cleared by the [place] FDA and  has been authorized for detection and/or diagnosis of SARS-CoV-2 by FDA under an Emergency Use Authorization (EUA). This EUA will remain  in effect (meaning this test can be used) for the duration of the COVID-19 declaration under Se ction 564(b)(1) of the Act, 21 U.S.C. section 360bbb-3(b)(1), unless the authorization is terminated or revoked sooner.  Performed at [facility], [place] [address], [place], [place] [address]  Culture, Urine     Status: Abnormal   Collection Time: 03/30/21 11:55 AM   Specimen: Urine, Clean Catch  Result Value Ref Range Status   Specimen Description   Final    URINE, CLEAN CATCH Performed at [facility], [place] [address], [place], [place] [address]    Special Requests   Final    NONE Performed at [facility], [place] [address], [place], [place] [address]    Culture 70,000 COLONIES/mL PSEUDOMONAS AERUGINOSA (A)  Final   Report Status 04/01/2021 FINAL  Final   Organism ID, Bacteria PSEUDOMONAS AERUGINOSA (A)  Final      Susceptibility   Pseudomonas aeruginosa - MIC*    CEFTAZIDIME 2 SENSITIVE Sensitive     CIPROFLOXACIN <=0.25 SENSITIVE Sensitive     GENTAMICIN 2 SENSITIVE Sensitive     IMIPENEM 2 SENSITIVE Sensitive     PIP/TAZO <=4 SENSITIVE Sensitive     CEFEPIME 2 SENSITIVE Sensitive     * 70,000 COLONIES/mL PSEUDOMONAS AERUGINOSA  Culture, Urine     Status: Abnormal   Collection Time: 04/01/21  8:56 AM   Specimen: Urine, Random  Result Value Ref Range Status   Specimen Description   Final    URINE, RANDOM Performed at [facility], [place] [address], [place], [place] [address]    Special Requests   Final    NONE Performed at [facility], [place] [address], [place], [place] [address]    Culture >=100,000 COLONIES/mL PSEUDOMONAS AERUGINOSA  (A)  Final   Report Status 04/03/2021 FINAL  Final   Organism ID, Bacteria PSEUDOMONAS AERUGINOSA (A)  Final      Susceptibility   Pseudomonas aeruginosa - MIC*    CEFTAZIDIME 2 SENSITIVE Sensitive     CIPROFLOXACIN <=0.25 SENSITIVE Sensitive     GENTAMICIN 2 SENSITIVE Sensitive     IMIPENEM 2 SENSITIVE Sensitive     PIP/TAZO 8 SENSITIVE Sensitive     CEFEPIME 2 SENSITIVE Sensitive     * >=100,000 COLONIES/mL PSEUDOMONAS AERUGINOSA  Culture, blood (routine x 2)     Status: None (Preliminary result)   Collection Time: 04/01/21  9:16 AM   Specimen: BLOOD  Result Value Ref Range Status   Specimen Description   Final    BLOOD BLOOD RIGHT HAND Performed at [place] [address] [place], [place] [address]    Special Requests   Final    BOTTLES DRAWN AEROBIC AND ANAEROBIC Blood Culture adequate volume Performed at [place] [address] [place], [place] [address]    Culture   Final    NO GROWTH 3 DAYS Performed at [facility], [place] [address], [place], [place] [address]    Report Status PENDING  Incomplete  Culture, blood (routine x 2)     Status: None (Preliminary result)   Collection Time: 04/01/21 10:38 AM   Specimen: BLOOD  Result Value Ref Range Status   Specimen Description   Final    BLOOD LEFT HAND Performed at [place] [address] [place], [place] [address]    Special Requests   Final    BOTTLES DRAWN AEROBIC ONLY Blood Culture results may not be optimal due to an inadequate volume of blood received in culture bottles Performed at [place] [address], [place], [place] [address]    Culture   Final    NO GROWTH 3 DAYS Performed at [facility], [address], [place], [place] [address]    Report Status PENDING  Incomplete         Radiology Studies: No results found.      Scheduled Meds: . carvedilol  12.5 mg Oral BID WC  . chlorhexidine   15 mL Mouth/Throat TID  . Chlorhexidine Gluconate Cloth  6 each Topical Daily  . enoxaparin (LOVENOX) injection  40 mg Subcutaneous Q24H  . feeding supplement  237 mL Oral TID BM  . fentaNYL  1 patch Transdermal Q72H  . FLUoxetine  20 mg Oral Daily  . gabapentin  200 mg Oral BID  . magic mouthwash w/lidocaine  2 mL Oral TID  . mouth rinse  15 mL Mouth Rinse q12n4p  . melatonin  10 mg Oral QHS  . mirtazapine  15 mg Oral QHS  . multivitamin with minerals  1 tablet Oral Daily  . nystatin  5 mL Oral QID  . pantoprazole sodium  40 mg Oral BID  . polyethylene glycol  17 g Oral Daily  . protein supplement  1 Scoop Oral TID WC  . sucralfate  1 g Oral TID WC & HS   Continuous Infusions: . sodium chloride    . ceFEPime (MAXIPIME) IV 2 g (04/04/21 1030)  . dextrose 5 % and 0.9 % NaCl with KCl 20 mEq/L 75 mL/hr at 04/04/21 7092     LOS: 8 days    Georgette Shell, MD 04/04/2021, 4:48 PM

## 2021-04-04 NOTE — Telephone Encounter (Signed)
Received call from pt SIL Yvette Roberts stating pt became alert enough in the hospital to request that IV pain medication be discontinued due to it causing her to be more drowsy.  Family requesting pt be transitioned to pain patch, p.o pain medication, and p.o Zofran to alleviate nausea.  RN will forward to MD for further evaluation.

## 2021-04-04 NOTE — Evaluation (Signed)
Physical Therapy Evaluation Patient Details Name: Yvette Roberts MRN: [id number] DOB: 08/12/1956 Today's Date: 04/04/2021   History of Present Illness  Yvette Roberts is a 65 year old female presents with severe odynophagia the resultant minimal intake and profound weight loss. EGD on 03/29/21 which showed mild nonsevere esophagitis. PMH: hypertension, COPD, diabetes, stage IV uterine cancer, history of malignant pleural effusion diagnosed in 2/22, currently on chemotherapy    Clinical Impression  Pt admitted with above diagnosis. Pt on bedpan upon arrival, requires max +2 to roll into sidelying and remove. Pt declines supine<>sit or transfer training despite encouragement, but agrees to allow PT to assess LEs in supine. Pt with BLE AAROM ~50%, limited by pain complaints described as "frozen". Per nurse tech, pt required +3 assist with stedy to transfer to chair earlier today and is not back in bed. Pt reports plan is to return home, but lives alone with son visiting from [place] on weekends. Attempted to educate pt on therapeutic services and benefit of rehab with guarded carryover. Pt currently with functional limitations due to the deficits listed below (see PT Problem List). Pt will benefit from skilled PT to increase their independence and safety with mobility to allow discharge to the venue listed below.       Follow Up Recommendations SNF    Equipment Recommendations  None recommended by PT    Recommendations for Other Services       Precautions / Restrictions Precautions Precautions: Fall Restrictions Weight Bearing Restrictions: No      Mobility  Bed Mobility Overal bed mobility: Needs Assistance Bed Mobility: Rolling Rolling: Max assist;+2 for physical assistance    General bed mobility comments: VC to reach for bedrail, max A +2 to roll into sidelying, unable to place LEs in hooklying to assist; pt declines supine<>sit    Transfers  General transfer comment: pt  declines  Ambulation/Gait  General Gait Details: pt declines  Stairs            Wheelchair Mobility    Modified Rankin (Stroke Patients Only)       Balance          Pertinent Vitals/Pain Pain Assessment: Faces Faces Pain Scale: Hurts even more Pain Location: BLE with AAROM Pain Descriptors / Indicators: Aching;Sore ("frozen") Pain Intervention(s): Limited activity within patient's tolerance;Monitored during session    [place] expects to be discharged to:: Private residence Living Arrangements: Alone Available Help at Discharge: Family;Available PRN/intermittently Type of Home: Apartment Home Access: Level entry     Home Layout: One level Home Equipment: Walker - 4 wheels      Prior Function Level of Independence: Independent         Comments: Pt reports independent with ADLs/IADLs, community ambulator, drives. Pt with rollator in room but states she doesn't use it     Hand Dominance        Extremity/Trunk Assessment   Upper Extremity Assessment Upper Extremity Assessment: Generalized weakness    Lower Extremity Assessment Lower Extremity Assessment: Generalized weakness (AAROM ~50%, able to wiggle toes and DF/PF <25%, unable to perform heel slide or SLR bil, denies numbness/tingling)       Communication   Communication: No difficulties  Cognition Arousal/Alertness: Awake/alert Behavior During Therapy: WFL for tasks assessed/performed Overall Cognitive Status: No family/caregiver present to determine baseline cognitive functioning  General Comments: Pt alert and oriented to self and hospital, appears to have moments of confusion asking therapist to "turn off the window", states she is ready to  go home, but unable to roll without +2 assist.      General Comments      Exercises     Assessment/Plan    PT Assessment Patient needs continued PT services  PT Problem List Decreased strength;Decreased range of  motion;Decreased activity tolerance;Decreased balance;Decreased mobility;Decreased cognition;Pain       PT Treatment Interventions DME instruction;Gait training;Functional mobility training;Therapeutic activities;Therapeutic exercise;Balance training;Neuromuscular re-education;Patient/family education    PT Goals (Current goals can be found in the Care Plan section)  Acute Rehab PT Goals Patient Stated Goal: return home PT Goal Formulation: With patient Time For Goal Achievement: 04/18/21 Potential to Achieve Goals: Fair    Frequency Min 2X/week   Barriers to discharge        Co-evaluation               AM-PAC PT "6 Clicks" Mobility  Outcome Measure Help needed turning from your back to your side while in a flat bed without using bedrails?: Total Help needed moving from lying on your back to sitting on the side of a flat bed without using bedrails?: Total Help needed moving to and from a bed to a chair (including a wheelchair)?: Total Help needed standing up from a chair using your arms (e.g., wheelchair or bedside chair)?: Total Help needed to walk in hospital room?: Total Help needed climbing 3-5 steps with a railing? : Total 6 Click Score: 6    End of Session   Activity Tolerance: Patient limited by pain Patient left: in bed;with call bell/phone within reach;with bed alarm set Nurse Communication: Mobility status;Other (comment) (wanting bedpan) PT Visit Diagnosis: Other abnormalities of gait and mobility (R26.89);Muscle weakness (generalized) (M62.81)    Time: 5697-9480 PT Time Calculation (min) (ACUTE ONLY): 22 min   Charges:   PT Evaluation $PT Eval Low Complexity: 1 Low           Tori Lyndy Russman PT, DPT 04/04/21, 4:37 PM

## 2021-04-04 NOTE — Progress Notes (Signed)
PHARMACY NOTE -  Cefepime  Pharmacy has been assisting with dosing of cefepime for PsA UTI.  Dosage remains stable at 2g IV q8 hr and further renal adjustments per institutional Pharmacy antibiotic protocol  Pharmacy will sign off, following peripherally for culture results or dose adjustments. Please reconsult if a change in clinical status warrants re-evaluation of dosage.  Reuel Boom, PharmD, BCPS [phone] 04/04/2021, 2:29 PM

## 2021-04-05 LAB — CBC WITH DIFFERENTIAL/PLATELET
Abs Immature Granulocytes: 0.08 10*3/uL — ABNORMAL HIGH (ref 0.00–0.07)
Basophils Absolute: 0 10*3/uL (ref 0.0–0.1)
Basophils Relative: 0 %
Eosinophils Absolute: 0 10*3/uL (ref 0.0–0.5)
Eosinophils Relative: 0 %
HCT: 22.9 % — ABNORMAL LOW (ref 36.0–46.0)
Hemoglobin: 7.5 g/dL — ABNORMAL LOW (ref 12.0–15.0)
Immature Granulocytes: 2 %
Lymphocytes Relative: 33 %
Lymphs Abs: 1.2 10*3/uL (ref 0.7–4.0)
MCH: 27.9 pg (ref 26.0–34.0)
MCHC: 32.8 g/dL (ref 30.0–36.0)
MCV: 85.1 fL (ref 80.0–100.0)
Monocytes Absolute: 0.2 10*3/uL (ref 0.1–1.0)
Monocytes Relative: 7 %
Neutro Abs: 2 10*3/uL (ref 1.7–7.7)
Neutrophils Relative %: 58 %
Platelets: 71 10*3/uL — ABNORMAL LOW (ref 150–400)
RBC: 2.69 MIL/uL — ABNORMAL LOW (ref 3.87–5.11)
RDW: 17.2 % — ABNORMAL HIGH (ref 11.5–15.5)
WBC: 3.5 10*3/uL — ABNORMAL LOW (ref 4.0–10.5)
nRBC: 0 % (ref 0.0–0.2)

## 2021-04-05 LAB — COMPREHENSIVE METABOLIC PANEL
ALT: 26 U/L (ref 0–44)
AST: 19 U/L (ref 15–41)
Albumin: 2.2 g/dL — ABNORMAL LOW (ref 3.5–5.0)
Alkaline Phosphatase: 40 U/L (ref 38–126)
Anion gap: 6 (ref 5–15)
BUN: 6 mg/dL — ABNORMAL LOW (ref 8–23)
CO2: 30 mmol/L (ref 22–32)
Calcium: 8.5 mg/dL — ABNORMAL LOW (ref 8.9–10.3)
Chloride: 102 mmol/L (ref 98–111)
Creatinine, Ser: 0.39 mg/dL — ABNORMAL LOW (ref 0.44–1.00)
GFR, Estimated: >60 mL/min (ref >60)
Glucose, Bld: 137 mg/dL — ABNORMAL HIGH (ref 70–99)
Potassium: 3.1 mmol/L — ABNORMAL LOW (ref 3.5–5.1)
Sodium: 138 mmol/L (ref 135–145)
Total Bilirubin: 0.6 mg/dL (ref 0.3–1.2)
Total Protein: 5.3 g/dL — ABNORMAL LOW (ref 6.5–8.1)

## 2021-04-05 LAB — PHOSPHORUS: Phosphorus: 2 mg/dL — ABNORMAL LOW (ref 2.5–4.6)

## 2021-04-05 LAB — GLUCOSE, CAPILLARY
Glucose-Capillary: 143 mg/dL — ABNORMAL HIGH (ref 70–99)
Glucose-Capillary: 139 mg/dL — ABNORMAL HIGH (ref 70–99)
Glucose-Capillary: 146 mg/dL — ABNORMAL HIGH (ref 70–99)
Glucose-Capillary: 151 mg/dL — ABNORMAL HIGH (ref 70–99)

## 2021-04-05 MED ORDER — CHLORHEXIDINE GLUCONATE 0.12 % MT SOLN
15.0000 mL | Freq: Four times a day (QID) | OROMUCOSAL | Status: DC
  Administered 2021-04-05 – 2021-04-27 (×59): 15 mL via OROMUCOSAL
  Filled 2021-04-05 (×50): qty 15

## 2021-04-05 MED ORDER — POTASSIUM PHOSPHATES 15 MMOLE/5ML IV SOLN
30.0000 mmol | Freq: Once | INTRAVENOUS | Status: AC
  Administered 2021-04-05: 30 mmol via INTRAVENOUS
  Filled 2021-04-05: qty 10

## 2021-04-05 MED ORDER — POTASSIUM CHLORIDE 10 MEQ/100ML IV SOLN
10.0000 meq | INTRAVENOUS | Status: AC
  Administered 2021-04-05 (×2): 10 meq via INTRAVENOUS
  Filled 2021-04-05 (×2): qty 100

## 2021-04-05 NOTE — Progress Notes (Addendum)
PROGRESS NOTE    Yvette Roberts  RJJ:[id number] DOB: [birth year] DOA: 03/27/2021 PCP: [name], FNP   Brief Narrative:65 year old African-American female with a past medical history metastatic uterine cancer, type 2 diabetes, hypertension malignant pleural effusion admitted with odynophagia and weight loss.  She reports losing 50 to 60 pounds in the last 3 months.  She underwent further work-up and had an EGD and found nonsevere esophagitis in the proximal and distal esophagus as well as scattered mild inflammation with erythema in the entire stomach.    A few nights ago she developed a fever and now she is febrile neutropenic so we will panculture her and start IV cefepime.  Her urine cultured last time to grow out 70,000 colony-forming units of Pseudomonas aeruginosa will cover with IV cefepime and repeat a urinalysis and urine culture. Repeat Urinalysis showed >100,000 GNR.  Patient continues to have some burning and discomfort in her urine so likely this is the source of her infection and will continue Treatment with IV Cefepime     Assessment & Plan:   Principal Problem:   Esophagitis Active Problems:   Type 2 diabetes mellitus without complication (HCC)   Hypertension   Uterine cancer (HCC)   Malignant pleural effusion   Protein-calorie malnutrition, severe (HCC)   Odynophagia   Decreased urine output   Odynophagia and Esophagitis, improving  GERD -GI was consulted and underwent EGD on 03/29/21 which showed mild nonsevere esophagitis in the proximal and distal esophagus as well as scattered mild inflammation with erythema in the entire stomach but she has profound symptoms of Esophagitis  -Her Bx are pending -Continue nystatin add chlorhexidine gluconate, continue Magic mouthwash and lidocaine prior to eating. Stopped IV Dilaudid and increased oxycodone to 10 mg every 6 as needed  -GI recommending continuing Pantoprazole BID and Carafate  1 gram po TIDwm and qHS as well  as advancing diet to Soft and Slowly  Advance diet as tolerated  -Patient to follow up with GI in the outpatient setting    Febrile Neutropenia -Patient had a T-max of 101 on 03/31/21  -No further fevers noted will DC cefepime 04/06/2021 if cultures remain negative. Patient has been started on cefepime and panculture has been done again.  -  Pancytopenia- --Continue monitor and trend and repeat CBC in a.m.   Pseudomonas Aeruginosa UTI continue cefepime.  DC Foley catheter.      Hypokalemia potassium 3.1 replete   Hypomagnesemia resolved -The patient magnesium level is now 2.1  Hypophosphatemia-phosphorus level is 2.0 from 1.7 yesterday with repletion.  Will replete with IV K-Phos again.   Severe Protein Calorie Malnutrition -Nutritionist was consulted -she has just started to eat will need to monitor for refeeding syndrome.  Check electrolytes daily.  Metastatic Uterine Cancer-followed by oncology holding off on further chemo at this time.   Malignant Pleural Effusion -CT Scan showed "Loculated RIGHT-sided pleural fluid with 6.5 x 3.6 cm axial dimension as compared to 8.1 x 3.9 cm. Basilar volume loss and signs of pleural thickening with decreased pleural thickening, markedly diminished since the prior PET and with continued decreased pleural thickening particularly in the inferior RIGHT chest even since the recent CT of the chest. Over the RIGHT hemidiaphragm pleural thickness approximately 7 mm as compared to 12 mm greatest thickness. LEFT chest is clear. Airways are patent." with the impression being "Decreased loculated pleural fluid and pleural thickening in the chest follows diminishing perihepatic soft tissue and capsular hepatic implants as described" -No Respiratory Distress  Hypertension -C/w Carvedilol 12.5 mg po BID -Continue to Monitor BP per Protocol  -Last BP reading was 138/90   Diabetes Mellitus Type 2 -Last hemoglobin A1c was 6.1 -Blood sugars ranging from 96-122  on daily BMPs/CMP's -CBGs ranging from 102-145 -continue to monitor blood sugars carefully and if necessary will add sensitive NovoLog sliding scale insulin AC   Anxiety and Depression -C/w Mirtazapine 50 mg p.o. nightly and Fluoxetine 20 mg p.o. daily    Nutrition Problem: Inadequate oral intake Etiology: cancer and cancer related treatments, decreased appetite (odynophagia)     Signs/Symptoms: per patient/family report    Interventions: Ensure Enlive (each supplement provides 350kcal and 20 grams of protein), Refer to RD note for recommendations  Estimated body mass index is 27.37 kg/m as calculated from the following:   Height as of 03/22/21: 5\' 6"  (1.676 m).   Weight as of 03/22/21: 76.9 kg.  DVT prophylaxis: Enoxaparin  Code Status: FULL CODE Family Communication: No family present at bedside  Disposition Plan: Pending further improvement and tolerance of diet and clearance by medical oncology   Status is: Inpatient   Remains inpatient appropriate because:Unsafe d/c plan, IV treatments appropriate due to intensity of illness or inability to take PO and Inpatient level of care appropriate due to severity of illness     Dispo: The patient is from: Home              Anticipated d/c is to: Home              Patient currently is not medically stable to d/c.              Difficult to place patient No   Consultants:  Medical Oncology  Gastroenterology    Subjective: She is resting in bed reports that she is feeling better than yesterday she is starting to have some p.o. intake Objective: Vitals:   04/03/21 2312 04/04/21 0502 04/04/21 2005 04/05/21 0559  BP: 123/74 138/90 129/77 (!) 144/79  Pulse: 75 78 88 77  Resp: 17 17 16 16   Temp: 98.6 F (37 C) 97.7 F (36.5 C) 98.2 F (36.8 C) 98.3 F (36.8 C)  TempSrc: Oral Oral Oral Oral  SpO2: 95% 96% 99% 96%    Intake/Output Summary (Last 24 hours) at 04/05/2021 1303 Last data filed at 04/05/2021 1156 Gross per 24 hour   Intake 3798.19 ml  Output 1750 ml  Net 2048.19 ml    There were no vitals filed for this visit.  Examination:  General exam: Appears calm and comfortable, port in right upper chest Respiratory system: Clear to auscultation. Respiratory effort normal. Cardiovascular system: S1 & S2 heard, RRR. No JVD, murmurs, rubs, gallops or clicks. No pedal edema. Gastrointestinal system: Abdomen is distended, soft and nontender. No organomegaly or masses felt. Normal bowel sounds heard. Central nervous system: Alert and oriented. No focal neurological deficits. Extremities: Symmetric 5 x 5 power. Skin: No rashes, lesions or ulcers Psychiatry: Judgement and insight appear normal. Mood & affect appropriate.     Data Reviewed: I have personally reviewed following labs and imaging studies  CBC: Recent Labs  Lab 03/31/21 0518 04/01/21 0459 04/02/21 0506 04/03/21 0758 04/04/21 0500 04/05/21 0500  WBC 1.0* 0.8* 1.2* 3.5* 5.2 3.5*  NEUTROABS 0.1*  --  TOO FEW TO COUNT 2.0 3.4 2.0  HGB 8.5* 8.2* 7.8* 7.9* 7.9* 7.5*  HCT 25.7* 25.3* 23.5* 24.6* 24.6* 22.9*  MCV 86.0 85.8 84.5 86.3 86.3 85.1  PLT 69* 67* 54* 74*  74* 71*    Basic Metabolic Panel: Recent Labs  Lab 03/31/21 0518 04/01/21 0459 04/02/21 0506 04/02/21 1630 04/03/21 0647 04/04/21 0500 04/05/21 0500  NA 136 134* 135 135 135 139 138  K 3.1* 3.2* 3.5 3.6 3.3* 3.5 3.1*  CL 95* 94* 98 98 101 103 102  CO2 32 30 28 29 23 29 30   GLUCOSE 122* 130* 101* 112* 82 125* 137*  BUN <5* 6* 12 14 12 10  6*  CREATININE 0.51 0.51 0.51 0.49 0.45 0.49 0.39*  CALCIUM 8.5* 8.4* 8.1* 8.1* 8.0* 8.5* 8.5*  MG 1.4* 1.7 2.0  --  2.1 1.6*  --   PHOS 2.0* 2.8 1.9*  --  1.9* 1.7* 2.0*    GFR: CrCl cannot be calculated (Unknown ideal weight.). Liver Function Tests: Recent Labs  Lab 04/02/21 0506 04/02/21 1630 04/03/21 0647 04/04/21 0500 04/05/21 0500  AST 26 18 15 28 19   ALT 42 37 29 32 26  ALKPHOS 37* 38 38 42 40  BILITOT 1.0 0.7 0.7 0.5  0.6  PROT 5.8* 5.9* 5.3* 5.6* 5.3*  ALBUMIN 2.4* 2.4* 2.2* 2.2* 2.2*    No results for input(s): LIPASE, AMYLASE in the last 168 hours. No results for input(s): AMMONIA in the last 168 hours. Coagulation Profile: No results for input(s): INR, PROTIME in the last 168 hours. Cardiac Enzymes: No results for input(s): CKTOTAL, CKMB, CKMBINDEX, TROPONINI in the last 168 hours. BNP (last 3 results) No results for input(s): PROBNP in the last 8760 hours. HbA1C: No results for input(s): HGBA1C in the last 72 hours. CBG: Recent Labs  Lab 04/04/21 0720 04/04/21 1157 04/04/21 1711 04/05/21 0759 04/05/21 1139  GLUCAP 133* 136* 129* 143* 139*    Lipid Profile: No results for input(s): CHOL, HDL, LDLCALC, TRIG, CHOLHDL, LDLDIRECT in the last 72 hours. Thyroid Function Tests: No results for input(s): TSH, T4TOTAL, FREET4, T3FREE, THYROIDAB in the last 72 hours. Anemia Panel: No results for input(s): VITAMINB12, FOLATE, FERRITIN, TIBC, IRON, RETICCTPCT in the last 72 hours. Sepsis Labs: No results for input(s): PROCALCITON, LATICACIDVEN in the last 168 hours.  Recent Results (from the past 240 hour(s))  SARS CORONAVIRUS 2 (TAT 6-24 HRS) Nasopharyngeal Nasopharyngeal Swab     Status: None   Collection Time: 03/28/21  5:20 AM   Specimen: Nasopharyngeal Swab  Result Value Ref Range Status   SARS Coronavirus 2 NEGATIVE NEGATIVE Final    Comment: (NOTE) SARS-CoV-2 target nucleic acids are NOT DETECTED.  The SARS-CoV-2 RNA is generally detectable in upper and lower respiratory specimens during the acute phase of infection. Negative results do not preclude SARS-CoV-2 infection, do not rule out co-infections with other pathogens, and should not be used as the sole basis for treatment or other patient management decisions. Negative results must be combined with clinical observations, patient history, and epidemiological information. The expected result is Negative.  Fact Sheet for  Patients: SugarRoll.be  Fact Sheet for Healthcare Providers: https://www.woods-[employer]ullea.com/  This test is not yet approved or cleared by the [place] FDA and  has been authorized for detection and/or diagnosis of SARS-CoV-2 by FDA under an Emergency Use Authorization (EUA). This EUA will remain  in effect (meaning this test can be used) for the duration of the COVID-19 declaration under Se ction 564(b)(1) of the Act, 21 U.S.C. section 360bbb-3(b)(1), unless the authorization is terminated or revoked sooner.  Performed at [facility], [place] [address] [place], [place] [address]   Culture, Urine     Status: Abnormal  Collection Time: 03/30/21 11:55 AM   Specimen: Urine, Clean Catch  Result Value Ref Range Status   Specimen Description   Final    URINE, CLEAN CATCH Performed at [facility], [place] [address] [place], [place] [address]    Special Requests   Final    NONE Performed at [facility], [place] [address] [place], [place] [address]    Culture 70,000 COLONIES/mL PSEUDOMONAS AERUGINOSA (A)  Final   Report Status 04/01/2021 FINAL  Final   Organism ID, Bacteria PSEUDOMONAS AERUGINOSA (A)  Final      Susceptibility   Pseudomonas aeruginosa - MIC*    CEFTAZIDIME 2 SENSITIVE Sensitive     CIPROFLOXACIN <=0.25 SENSITIVE Sensitive     GENTAMICIN 2 SENSITIVE Sensitive     IMIPENEM 2 SENSITIVE Sensitive     PIP/TAZO <=4 SENSITIVE Sensitive     CEFEPIME 2 SENSITIVE Sensitive     * 70,000 COLONIES/mL PSEUDOMONAS AERUGINOSA  Culture, Urine     Status: Abnormal   Collection Time: 04/01/21  8:56 AM   Specimen: Urine, Random  Result Value Ref Range Status   Specimen Description   Final    URINE, RANDOM Performed at [facility], [place] [address] [place], [place] [address]    Special Requests   Final    NONE Performed at [facility], [place] [address],  [place], [place] [address]    Culture >=100,000 COLONIES/mL PSEUDOMONAS AERUGINOSA (A)  Final   Report Status 04/03/2021 FINAL  Final   Organism ID, Bacteria PSEUDOMONAS AERUGINOSA (A)  Final      Susceptibility   Pseudomonas aeruginosa - MIC*    CEFTAZIDIME 2 SENSITIVE Sensitive     CIPROFLOXACIN <=0.25 SENSITIVE Sensitive     GENTAMICIN 2 SENSITIVE Sensitive     IMIPENEM 2 SENSITIVE Sensitive     PIP/TAZO 8 SENSITIVE Sensitive     CEFEPIME 2 SENSITIVE Sensitive     * >=100,000 COLONIES/mL PSEUDOMONAS AERUGINOSA  Culture, blood (routine x 2)     Status: None (Preliminary result)   Collection Time: 04/01/21  9:16 AM   Specimen: BLOOD  Result Value Ref Range Status   Specimen Description   Final    BLOOD BLOOD RIGHT HAND Performed at [place] [address], [place], [place] [address]    Special Requests   Final    BOTTLES DRAWN AEROBIC AND ANAEROBIC Blood Culture adequate volume Performed at [place] [address] [place], [place] [address]    Culture   Final    NO GROWTH 4 DAYS Performed at [facility], [place] [address], [place], [place] [address]    Report Status PENDING  Incomplete  Culture, blood (routine x 2)     Status: None (Preliminary result)   Collection Time: 04/01/21 10:38 AM   Specimen: BLOOD  Result Value Ref Range Status   Specimen Description   Final    BLOOD LEFT HAND Performed at [place] [address] [place], [place] [address]    Special Requests   Final    BOTTLES DRAWN AEROBIC ONLY Blood Culture results may not be optimal due to an inadequate volume of blood received in culture bottles Performed at [place] [address], [place], [place] [address]    Culture   Final    NO GROWTH 4 DAYS Performed at [facility], [place] [address] [place], [place] [address]  Report Status PENDING  Incomplete         Radiology Studies: No results  found.      Scheduled Meds:  carvedilol  12.5 mg Oral BID WC   chlorhexidine  15 mL Mouth/Throat TID   Chlorhexidine Gluconate Cloth  6 each Topical Daily   enoxaparin (LOVENOX) injection  40 mg Subcutaneous Q24H   feeding supplement  237 mL Oral TID BM   fentaNYL  1 patch Transdermal Q72H   FLUoxetine  20 mg Oral Daily   gabapentin  200 mg Oral BID   magic mouthwash w/lidocaine  2 mL Oral TID   mouth rinse  15 mL Mouth Rinse q12n4p   melatonin  10 mg Oral QHS   mirtazapine  15 mg Oral QHS   multivitamin with minerals  1 tablet Oral Daily   nystatin  5 mL Oral QID   pantoprazole sodium  40 mg Oral BID   polyethylene glycol  17 g Oral Daily   protein supplement  1 Scoop Oral TID WC   sucralfate  1 g Oral TID WC & HS   Continuous Infusions:  sodium chloride     ceFEPime (MAXIPIME) IV 2 g (04/05/21 0919)   dextrose 5 % and 0.9 % NaCl with KCl 20 mEq/L 75 mL/hr at 04/05/21 0431     LOS: 9 days    Georgette Shell, MD 04/05/2021, 1:03 PM

## 2021-04-05 NOTE — Care Management Important Message (Signed)
Important Message  Patient Details IM Letter given to the Patient. Name: Yvette Roberts MRN: [id number] Date of Birth: [birth year]   Medicare Important Message Given:  Yes     Kerin Salen 04/05/2021, 9:07 AM

## 2021-04-05 NOTE — TOC Initial Note (Signed)
Transition of Care [facility]) - Initial/Assessment Note    Patient Details  Name: JANEENE SAND MRN: [id number] Date of Birth: 1956-05-14  Transition of [facility]) CM/SW Contact:    Lynnell Catalan, RN Phone Number: 04/05/2021, 2:14 PM  Clinical Narrative:                 Spoke with pt at bedside for dc planning. Pt really wants to go home but she lives alone and says that her son would only be able to help her on the weekends. Per MD pt should be here through the weekend and I encouraged her to really try with PT if she wants to be able to go home. Permission received to fax out an [facility] for her just in case SNF is needed at dc. Pas[facility] initiated and additional information was requested by Pasrr. Additional clinicals sent to Pas[facility].  Expected Discharge Plan: Skilled Nursing Facility Barriers to Discharge: Continued Medical Work up   Patient Goals and CMS Choice Patient states their goals for this hospitalization and ongoing recovery are:: To go home      Expected Discharge Plan and Services Expected Discharge Plan: [place]   Discharge Planning Services: CM Consult   Living arrangements for the past 2 months: Apartment                   Prior Living Arrangements/Services Living arrangements for the past 2 months: Apartment Lives with:: Self Patient language and need for interpreter reviewed:: Yes        Need for Family Participation in Patient Care: Yes (Comment) Care giver support system in place?: Yes (comment)   Criminal Activity/Legal Involvement Pertinent to Current Situation/Hospitalization: No - Comment as needed  Activities of Daily Living Home Assistive Devices/Equipment: Gilford Rile (specify type) ADL Screening (condition at time of admission) Patient's cognitive ability adequate to safely complete daily activities?: Yes Is the patient deaf or have difficulty hearing?: No Does the patient have difficulty seeing, even when wearing glasses/contacts?: No Does  the patient have difficulty concentrating, remembering, or making decisions?: No Patient able to express need for assistance with ADLs?: No Does the patient have difficulty dressing or bathing?: No Independently performs ADLs?: Yes (appropriate for developmental age) Does the patient have difficulty walking or climbing stairs?: Yes Weakness of Legs: Both Weakness of Arms/Hands: None  Permission Sought/Granted Permission sought to share information with : Facility Art therapist granted to share information with : Yes, Verbal Permission Granted     Permission granted to share info w AGENCY: Fax out in hub        Emotional Assessment Appearance:: Appears stated age Attitude/Demeanor/Rapport: Gracious Affect (typically observed): Calm Orientation: : Oriented to Self, Oriented to Place, Oriented to  Time, Oriented to Situation Alcohol / Substance Use: Not Applicable Psych Involvement: No (comment)  Admission diagnosis:  Odynophagia [R13.10] Patient Active Problem List   Diagnosis Date Noted   Decreased urine output    Protein-calorie malnutrition, severe ([place]) 03/27/2021   Odynophagia 03/27/2021   Insomnia 03/13/2021   Esophagitis 03/06/2021   Dysphagia 03/06/2021   Hypomagnesemia 03/01/2021   Hypokalemia 02/08/2021   Other constipation 01/30/2021   Abnormal weight loss 01/30/2021   Cancer associated pain 01/08/2021   Goals of care, counseling/discussion 01/08/2021   Malignant pleural effusion 01/03/2021   Metastasis to liver ([place]) 01/03/2021   Uterine cancer ([place]) 12/20/2020   Acute hypoxemic respiratory failure ([place]) 11/23/2020   Pleural effusion, right 11/23/2020   Atelectasis of right  lung 11/23/2020   Hyponatremia 11/23/2020   Anxiety 05/09/2020   Trigger point of right shoulder region 03/09/2019   Cervical radiculopathy at C8 03/09/2019   Unilateral primary osteoarthritis, left knee 11/17/2017   Unilateral primary osteoarthritis, right knee  11/17/2017   Degenerative arthritis of knee, bilateral 07/14/2017   Baker cyst, left 06/16/2017   Pain and swelling of left lower extremity 05/26/2017   Degenerative joint disease of knee, left 05/26/2017   Hyperlipemia 06/12/2016   MDD (major depressive disorder) 06/12/2016   Urticaria 06/12/2016   Type 2 diabetes mellitus without complication ([place]) 95/28/4132   Class II obesity 07/21/2014   Hypertension 07/21/2014   Esophageal reflux 07/21/2014   PCP:  [name], FNP Pharmacy:   [facility] [place], [place] - [phone] [address] [place] [address] Phone: [phone] Fax: [phone]  [facility] [place], [place] [address] Phone: [phone] Fax: [phone]  [facility] [address] [place] [address] Phone: [phone] Fax: [place] [address] Phone: [phone] Fax: [phone]     Social Determinants of Health (SDOH) Interventions    Readmission Risk Interventions Readmission Risk Prevention Plan 04/05/2021  Transportation Screening Complete  Medication Review ([place]) Complete  PCP or Specialist appointment within 3-5 days of discharge Complete  HRI or Home Care Consult Complete  SW Recovery Care/Counseling Consult Complete  Palliative Care Screening Not [place] Complete  Some recent data might be hidden

## 2021-04-05 NOTE — Plan of Care (Signed)
  Problem: Education: Goal: Knowledge of General Education information will improve Description: Including pain rating scale, medication(s)/side effects and non-pharmacologic comfort measures Outcome: Progressing   Problem: Health Behavior/Discharge Planning: Goal: Ability to manage health-related needs will improve Outcome: Progressing   Problem: Clinical Measurements: Goal: Ability to maintain clinical measurements within normal limits will improve Outcome: Progressing Goal: Will remain free from infection Outcome: Progressing Goal: Diagnostic test results will improve Outcome: Progressing Goal: Respiratory complications will improve Outcome: Progressing Goal: Cardiovascular complication will be avoided Outcome: Progressing   Problem: Activity: Goal: Risk for activity intolerance will decrease Outcome: Progressing   Problem: Nutrition: Goal: Adequate nutrition will be maintained Outcome: Progressing   Problem: Coping: Goal: Level of anxiety will decrease Outcome: Progressing   Problem: Elimination: Goal: Will not experience complications related to bowel motility Outcome: Progressing Goal: Will not experience complications related to urinary retention Outcome: Progressing   Problem: Pain Managment: Goal: General experience of comfort will improve Outcome: Progressing   Problem: Safety: Goal: Ability to remain free from injury will improve Outcome: Progressing   Problem: Skin Integrity: Goal: Risk for impaired skin integrity will decrease Outcome: Progressing   Problem: Nutrition Goal: Patient maintains adequate hydration Outcome: Progressing Goal: Patient maintains weight Outcome: Progressing Goal: Patient/Family demonstrates understanding of diet Outcome: Progressing Goal: Patient/Family independently completes tube feeding Outcome: Progressing Goal: Patient will have no more than 5 lb weight change during LOS Outcome: Progressing Goal: Patient will  utilize adaptive techniques to administer nutrition Outcome: Progressing Goal: Patient will verbalize dietary restrictions Outcome: Progressing

## 2021-04-05 NOTE — Progress Notes (Signed)
Transition of Care (TOC) -30 day Note       Patient Details  Name: Yvette Roberts MRN: [id number] Date of Birth: [birth year]   MUST ID: [id number]   To Whom it May Concern:   Please be advised that the above patient will require a short-term nursing home stay, anticipated 30 days or less rehabilitation and strengthening. The plan is for return home.

## 2021-04-05 NOTE — NC FL2 (Signed)
[place] LEVEL OF CARE SCREENING TOOL     IDENTIFICATION  Patient Name: Yvette Roberts Birthdate: 1956/06/15 Sex: female Admission Date (Current Location): 03/27/2021  [facility] and [place] Number:  Herbalist and Address:  [facility],  [address], [place]      Provider Number: [id number]  Attending Physician Name and Address:  Georgette Shell, MD  Relative Name and Phone Number:       Current Level of Care: Hospital Recommended Level of Care: [place] Prior Approval Number:    Date Approved/Denied:   PASRR Number: Pending  Discharge Plan: SNF    Current Diagnoses: Patient Active Problem List   Diagnosis Date Noted   Decreased urine output    Protein-calorie malnutrition, severe ([place]) 03/27/2021   Odynophagia 03/27/2021   Insomnia 03/13/2021   Esophagitis 03/06/2021   Dysphagia 03/06/2021   Hypomagnesemia 03/01/2021   Hypokalemia 02/08/2021   Other constipation 01/30/2021   Abnormal weight loss 01/30/2021   Cancer associated pain 01/08/2021   Goals of care, counseling/discussion 01/08/2021   Malignant pleural effusion 01/03/2021   Metastasis to liver ([place]) 01/03/2021   Uterine cancer ([place]) 12/20/2020   Acute hypoxemic respiratory failure ([place]) 11/23/2020   Pleural effusion, right 11/23/2020   Atelectasis of right lung 11/23/2020   Hyponatremia 11/23/2020   Anxiety 05/09/2020   Trigger point of right shoulder region 03/09/2019   Cervical radiculopathy at C8 03/09/2019   Unilateral primary osteoarthritis, left knee 11/17/2017   Unilateral primary osteoarthritis, right knee 11/17/2017   Degenerative arthritis of knee, bilateral 07/14/2017   Baker cyst, left 06/16/2017   Pain and swelling of left lower extremity 05/26/2017   Degenerative joint disease of knee, left 05/26/2017   Hyperlipemia 06/12/2016   MDD (major depressive disorder) 06/12/2016   Urticaria 06/12/2016   Type 2 diabetes  mellitus without complication ([place]) 86/57/8469   Class II obesity 07/21/2014   Hypertension 07/21/2014   Esophageal reflux 07/21/2014    Orientation RESPIRATION BLADDER Height & Weight     Self, Time, Situation, Place  Normal Continent Weight:   Height:     BEHAVIORAL SYMPTOMS/MOOD NEUROLOGICAL BOWEL NUTRITION STATUS      Incontinent Diet  AMBULATORY STATUS COMMUNICATION OF NEEDS Skin   Extensive Assist Verbally Normal                       Personal Care Assistance Level of Assistance  Bathing, Feeding, Dressing Bathing Assistance: Limited assistance Feeding assistance: Independent Dressing Assistance: Limited assistance     Functional Limitations Info  Sight, Hearing, Speech Sight Info: Adequate Hearing Info: Adequate Speech Info: Adequate    SPECIAL CARE FACTORS FREQUENCY  PT (By licensed PT), OT (By licensed OT)     PT Frequency: 5 x weekly OT Frequency: 5 x weekly            Contractures Contractures Info: Not present    Additional Factors Info  Code Status, Allergies Code Status Info: Full Allergies Info: Eggs, Influenza vaccines, Peanuts, Penicillins, Sulfa drugs, Codeine           Current Medications (04/05/2021):  This is the current hospital active medication list Current Facility-Administered Medications  Medication Dose Route Frequency Provider Last Rate Last Admin   0.9 %  sodium chloride infusion   Intravenous Once Maryanna Shape, NP       acetaminophen (TYLENOL) tablet 650 mg  650 mg Oral Q6H PRN Lang Snow, FNP  650 mg at 04/02/21 4315   Or   acetaminophen (TYLENOL) suppository 650 mg  650 mg Rectal Q4H PRN Lang Snow, FNP       carvedilol (COREG) tablet 12.5 mg  12.5 mg Oral BID WC Domenic Polite, MD   12.5 mg at 04/05/21 0857   ceFEPIme (MAXIPIME) 2 g in sodium chloride 0.9 % 100 mL IVPB  2 g Intravenous Q8H Sheikh, Omair [place], DO 200 mL/hr at 04/05/21 0919 2 g at 04/05/21 0919   chlorhexidine (PERIDEX) 0.12 % solution 15  mL  15 mL Mouth/Throat QID Georgette Shell, MD       Chlorhexidine Gluconate Cloth 2 % PADS 6 each  6 each Topical Daily Domenic Polite, MD   6 each at 04/05/21 1142   dextrose 5 % and 0.9 % NaCl with KCl 20 mEq/L infusion   Intravenous Continuous Raiford Noble [place], DO 75 mL/hr at 04/05/21 0431 Infusion Verify at 04/05/21 0431   enoxaparin (LOVENOX) injection 40 mg  40 mg Subcutaneous Q24H Domenic Polite, MD   40 mg at 04/04/21 2137   feeding supplement (ENSURE ENLIVE / ENSURE PLUS) liquid 237 mL  237 mL Oral TID BM SheikhGeorgina Quint [place], DO   237 mL at 04/04/21 2009   fentaNYL (DURAGESIC) 12 MCG/HR 1 patch  1 patch Transdermal Q72H Georgette Shell, MD   1 patch at 04/04/21 1346   FLUoxetine (PROZAC) 20 MG/5ML solution 20 mg  20 mg Oral Daily Domenic Polite, MD   20 mg at 04/05/21 0914   gabapentin (NEURONTIN) capsule 200 mg  200 mg Oral BID Domenic Polite, MD   200 mg at 04/05/21 0857   lip balm (CARMEX) ointment 1 application  1 application Topical PRN Vashti Hey, MD       magic mouthwash w/lidocaine  2 mL Oral TID Domenic Polite, MD   2 mL at 04/05/21 0916   MEDLINE mouth rinse  15 mL Mouth Rinse q12n4p Domenic Polite, MD   15 mL at 04/03/21 1151   melatonin tablet 10 mg  10 mg Oral QHS Domenic Polite, MD   10 mg at 04/04/21 2136   mirtazapine (REMERON SOL-TAB) disintegrating tablet 15 mg  15 mg Oral QHS Domenic Polite, MD   15 mg at 04/04/21 2156   multivitamin with minerals tablet 1 tablet  1 tablet Oral Daily Raiford Noble [place], DO   1 tablet at 04/05/21 0857   nystatin (MYCOSTATIN) 100000 UNIT/ML suspension 500,000 Units  5 mL Oral QID Domenic Polite, MD   500,000 Units at 04/05/21 0914   ondansetron (ZOFRAN) tablet 4 mg  4 mg Oral Q6H PRN Domenic Polite, MD       Or   ondansetron [facility]) injection 4 mg  4 mg Intravenous Q6H PRN Domenic Polite, MD   4 mg at 04/05/21 4008   oxyCODONE (Oxy IR/ROXICODONE) immediate release tablet 10 mg  10 mg Oral Q6H PRN  Georgette Shell, MD   10 mg at 04/05/21 0856   pantoprazole sodium (PROTONIX) 40 mg/20 mL oral suspension 40 mg  40 mg Oral BID Adrian Saran, RPH   40 mg at 04/05/21 1143   phenol (CHLORASEPTIC) mouth spray 1 spray  1 spray Mouth/Throat PRN Domenic Polite, MD   1 spray at 03/30/21 0800   polyethylene glycol (MIRALAX / GLYCOLAX) packet 17 g  17 g Oral Daily Domenic Polite, MD   17 g at 04/02/21 0929   potassium chloride 10 mEq in 100 mL IVPB  10 mEq Intravenous Q1 Hr x 2 Georgette Shell, MD       potassium PHOSPHATE 30 mmol in dextrose 5 % 500 mL infusion  30 mmol Intravenous Once Georgette Shell, MD       protein supplement (RESOURCE BENEPROTEIN) powder packet 6 g  1 Scoop Oral TID WC Sheikh, Omair [place], DO   6 g at 04/05/21 0858   sucralfate (CARAFATE) tablet 1 g  1 g Oral TID WC & HS Brahmbhatt, Parag, MD   1 g at 04/05/21 0857     Discharge Medications: Please see discharge summary for a list of discharge medications.  Relevant Imaging Results:  Relevant Lab Results:   Additional Information ss# [id number]  Leonarda Leis, Marjie Skiff, RN

## 2021-04-05 NOTE — Progress Notes (Signed)
Yvette Roberts   DOB:24-Mar-1956   MG#:[id number]    ASSESSMENT & PLAN:   Metastatic Uterine cancer ([place]) I have personally reviewed CT imaging She has good response to treatment Continue supportive care: I have cancelled all her future treatment until she is fully recovered   Esophagitis, resolving She has profound symptoms of esophagitis Appreciated GI work-up I have reviewed EGD report She will continue antiacid medications as prescribed by GI service Her esophagitis actually got better but she is still not swallowing   Dysphagia, improving This could be related to esophagitis  She will attempt to swallow more now that her pain is better controlled; however, it appears that the patient is somewhat reluctant to attend swallowing whatsoever   Cancer associated pain, improving This is improved with addition of fentanyl patch I reviewed the plan of care with hospitalist She may or may not be able to afford fentanyl patch upon discharge Prior to admission to the hospital, she is only using oxycodone as needed I am hopeful we can wean her off fentanyl patch prior to discharge   Abnormal weight loss Her oral intake remain poor Continue to encourage the patient to eat as much as possible  Urinary retention She wants Foley catheter removal We can remove it and see if she can urinate on her own prior to discharge   Acquired pancytopenia Developed fever over the weekend and has been started on Granix She does not need blood transfusion unless hemoglobin is less than 8 or platelet count less than 10 Monitor closely; CBC from today is better. She does not need transfusion support   Severe electrolyte imbalance Due to poor oral intake Continue replacement therapy   Goals of care, counseling/discussion I have reviewed goals of care discussion in the purpose of admission with the patient and her niece and they are both in agreement to be admitted   Discharge planning  She is not  ready to be discharged until she can tolerate oral diet and able to swallow her pills She is at risk of refeeding syndrome I estimate she will be here over the weekend  All questions were answered. The patient knows to call the clinic with any problems, questions or concerns.   The total time spent in the appointment was 25 minutes encounter with patients including review of chart and various tests results, discussions about plan of care and coordination of care plan  [name], MD 04/05/2021 7:47 AM  Subjective:  She is seen this morning She has not eaten much Her pain is well controlled She wants a Foley catheter out No reported fever or bleeding  Objective:  Vitals:   04/04/21 2005 04/05/21 0559  BP: 129/77 (!) 144/79  Pulse: 88 77  Resp: 16 16  Temp: 98.2 F (36.8 C) 98.3 F (36.8 C)  SpO2: 99% 96%     Intake/Output Summary (Last 24 hours) at 04/05/2021 0747 Last data filed at 04/05/2021 0600 Gross per 24 hour  Intake 3916.19 ml  Output 1250 ml  Net 2666.19 ml    GENERAL:alert, no distress and comfortable  NEURO: alert & oriented x 3 with fluent speech, no focal motor/sensory deficits   Labs:  Recent Labs    11/23/20 0324 11/23/20 0421 04/03/21 0647 04/04/21 0500 04/05/21 0500  NA 132*   < > 135 139 138  K 4.5   < > 3.3* 3.5 3.1*  CL 96*   < > 101 103 102  CO2 22   < > 23  29 30  GLUCOSE 212*   < > 82 125* 137*  BUN 21   < > 12 10 6*  CREATININE 1.28*   < > 0.45 0.49 0.39*  CALCIUM 9.5   < > 8.0* 8.5* 8.5*  GFRNONAA 46*   < > >60 >60 >60  PROT 8.2*   < > 5.3* 5.6* 5.3*  ALBUMIN 3.4*   < > 2.2* 2.2* 2.2*  AST 23   < > 15 28 19   ALT 23   < > 29 32 26  ALKPHOS 63   < > 38 42 40  BILITOT 0.9   < > 0.7 0.5 0.6  BILIDIR 0.2  --   --   --   --   IBILI 0.7  --   --   --   --    < > = values in this interval not displayed.    Studies:  DG Chest 2 View  Result Date: 03/27/2021 CLINICAL DATA:  65 year old female with hypertension and diabetes. Stage IV  uterine cancer. EXAM: CHEST - 2 VIEW COMPARISON:  Chest radiograph dated 02/25/2021. FINDINGS: Right-sided Port-A-Cath similar position. Small right pleural effusion similar to prior radiograph. Interval decrease in the size of the opacity seen in the right mid lung field. The left lung is clear. No pneumothorax. Stable cardiomediastinal silhouette. Degenerative changes of the spine. No acute osseous pathology. IMPRESSION: Decrease in the size of opacity in the right mid lung field compared to prior radiograph. Unchanged small right pleural effusion. Electronically Signed   By: Anner Crete M.D.   On: 03/27/2021 19:23   CT CHEST ABDOMEN PELVIS W CONTRAST  Result Date: 03/28/2021 CLINICAL DATA:  Ovarian cancer assess treatment response. EXAM: CT CHEST, ABDOMEN, AND PELVIS WITH CONTRAST TECHNIQUE: Multidetector CT imaging of the chest, abdomen and pelvis was performed following the standard protocol during bolus administration of intravenous contrast. CONTRAST:  127mL OMNIPAQUE IOHEXOL 300 MG/ML  SOLN COMPARISON:  PET-CT from December 08, 2020 and CT of the chest of Feb 25, 2021. FINDINGS: CT CHEST FINDINGS Cardiovascular: RIGHT-sided Port-A-Cath terminates in the lower RIGHT atrium similar to recent CT imaging, perhaps influenced by arm position as on the chest x-ray the tip is at the caval to atrial junction. Heart size is normal without substantial pericardial effusion. Normal caliber of the thoracic aorta. Normal caliber of the central pulmonary vasculature. Mediastinum/Nodes: No thoracic inlet adenopathy. No axillary adenopathy. No mediastinal lymphadenopathy. Esophagus mildly patulous. Lungs/Pleura: Loculated RIGHT-sided pleural fluid with 6.5 x 3.6 cm axial dimension as compared to 8.1 x 3.9 cm. Basilar volume loss and signs of pleural thickening with decreased pleural thickening, markedly diminished since the prior PET and with continued decreased pleural thickening particularly in the inferior RIGHT  chest even since the recent CT of the chest. Over the RIGHT hemidiaphragm pleural thickness approximately 7 mm as compared to 12 mm greatest thickness. LEFT chest is clear. Airways are patent. Musculoskeletal: See below for full musculoskeletal details. CT ABDOMEN PELVIS FINDINGS Hepatobiliary: Low-density lesions along the cephalad margin of the RIGHT hemi liver and across the top of the RIGHT hemidiaphragm and diminished in size considerably compared to the PET exam of February of 2022. Pleural thickening across the surface of the liver also with decreased thickness (image 47/2) 7 mm greatest thickness as compared to 14 mm greatest thickness. Area of low attenuation along the capsule of the liver (image 45/2) overlying hepatic subsegment Roman numeral 8 measuring 18 mm as compared to 20 mm on the  most recent comparison and is much as 43 mm on the prior CT evaluation. Capsular implant with extension into hepatic parenchyma much less conspicuous today than on the previous PET-CT from February and the CT from Feb 25, 2021. This area measuring approximately 1.8 as compared to 2.4 cm based on comparison with the most recent study. Much smaller than on the study of February of 2022 where it measured approximately 41 mm. Cystic area along the gallbladder fossa and in the inferior aspect of the RIGHT hemi liver with stable appearance largest area measuring 3 cm. Portal vein is patent. Hepatic veins are patent. Cholelithiasis without pericholecystic stranding. No sign of biliary duct distension. Pancreas: Normal, without mass, inflammation or ductal dilatation. Spleen: Spleen normal size and contour. Adrenals/Urinary Tract: Adrenal glands are normal. Symmetric renal enhancement. No hydronephrosis. Smooth contour the urinary bladder. Stomach/Bowel: No acute gastrointestinal process soft tissue adjacent to the appendix in the RIGHT lower quadrant without signs of adjacent stranding is an unchanged caliber of the appendix  dating back to February 2021 in the setting of known peritoneal disease with diminished fluid in the pelvis and omental and serosal nodularity throughout the abdomen. Vascular/Lymphatic: Normal caliber abdominal aorta and IVC. There is no gastrohepatic or hepatoduodenal ligament lymphadenopathy. No retroperitoneal or mesenteric lymphadenopathy. Reproductive: Decreased bulky appearance of the uterus. RIGHT adnexal cystic and solid area measuring 4.5 x 2.5 cm as compared to 5.9 x 3.1 cm. Decreased ascites in the pelvis. Other: Decreased ascites as above. Decreased signs of omental nodularity and serosal nodularity. Liver capsular disease as described above. Discrete nodules in the LEFT omentum persist, largest approximately 6 mm (image 88/2) previously 6-7 mm. Focal thickening of the LEFT rectus muscle is diminished no measurable lesion in this area, thickness of the LEFT rectus muscle on image 103 of series 2 is 13 mm as compared to 24 mm. The adjacent omental and peritoneal nodularity and stranding is subjectively diminished since the prior study. Musculoskeletal: Spinal degenerative changes. New sclerotic focus in the RIGHT proximal femur in the femoral neck and head junction measuring 14 mm. New sclerotic focus at the T12 level (image 60/2) 13 mm. Sclerosis at the T12 level is present however on the study of May of 2022. Not seen on the PET exam of February 2022. Spinal degenerative changes. IMPRESSION: 1. Decreased loculated pleural fluid and pleural thickening in the chest follows diminishing perihepatic soft tissue and capsular hepatic implants as described. 2. No adenopathy by size criteria in the abdomen or pelvis. Minimal soft tissue in the intra-aortocaval groove is all that remains on image 77 of series 2 in the retroperitoneum. 3. Decreased bulk of the uterus subjectively and of RIGHT adnexal structures as described. 4. New areas of bony sclerosis compatible with skeletal metastasis. No signs of increased  FDG uptake and no visible sclerosis on the previous PET. In the context of improvement of other disease it is possible that this represents treated metastasis. Would suggest attention on follow-up. 5. Loculated RIGHT-sided pleural fluid with signs of pleural thickening in the lower RIGHT chest otherwise with similar appearance. 6. Cholelithiasis without evidence of acute cholecystitis. 7. Aortic atherosclerosis. Electronically Signed   By: Zetta Bills M.D.   On: 03/28/2021 15:52   DG CHEST PORT 1 VIEW  Result Date: 04/01/2021 CLINICAL DATA:  Neutropenic fever EXAM: PORTABLE CHEST 1 VIEW COMPARISON:  March 31, 2021 FINDINGS: Stable small right-sided loculated pleural effusion. Hazy opacification of the right hemithorax with fluid in the minor fissure likely representing a  small free-flowing pleural effusion. Atelectasis in the right lung base. Left lung is clear. Right chest wall Port-A-Cath with tip overlying the right atrium. The heart size and mediastinal contours are unchanged. Thoracic spondylosis with degenerative changes shoulders. IMPRESSION: Stable right-sided pleural fluid with adjacent atelectasis. Left lung is clear. Electronically Signed   By: Dahlia Bailiff MD   On: 04/01/2021 11:37   DG CHEST PORT 1 VIEW  Result Date: 03/31/2021 CLINICAL DATA:  Shortness of breath.  History of ovarian carcinoma EXAM: PORTABLE CHEST 1 VIEW COMPARISON:  Chest radiograph Mar 27, 2021; chest CT March 28, 2021 FINDINGS: Apparent loculated pleural effusion on the right remains. There is also an apparent free-flowing small right pleural effusion, not appreciably changed from recent CT. Atelectasis right base. Left lung clear. Heart is upper normal in size with pulmonary vascularity normal. Port-A-Cath tip is in the right atrium slightly beyond the superior vena cava-right atrium junction. No adenopathy appreciable. Degenerative change in each shoulder noted. No pneumothorax. IMPRESSION: Pleural effusion on the right,  primarily loculated, essentially stable compared to recent CT examination. Right base atelectasis noted. Left lung clear. No new opacity evident. Stable cardiac silhouette. Port-A-Cath present on the right. Electronically Signed   By: Lowella Grip III M.D.   On: 03/31/2021 08:12

## 2021-04-06 LAB — CBC WITH DIFFERENTIAL/PLATELET
Abs Immature Granulocytes: 0.02 10*3/uL (ref 0.00–0.07)
Basophils Absolute: 0 10*3/uL (ref 0.0–0.1)
Basophils Relative: 0 %
Eosinophils Absolute: 0 10*3/uL (ref 0.0–0.5)
Eosinophils Relative: 0 %
HCT: 24.4 % — ABNORMAL LOW (ref 36.0–46.0)
Hemoglobin: 8 g/dL — ABNORMAL LOW (ref 12.0–15.0)
Immature Granulocytes: 1 %
Lymphocytes Relative: 40 %
Lymphs Abs: 1.2 10*3/uL (ref 0.7–4.0)
MCH: 27.5 pg (ref 26.0–34.0)
MCHC: 32.8 g/dL (ref 30.0–36.0)
MCV: 83.8 fL (ref 80.0–100.0)
Monocytes Absolute: 0.4 10*3/uL (ref 0.1–1.0)
Monocytes Relative: 13 %
Neutro Abs: 1.4 10*3/uL — ABNORMAL LOW (ref 1.7–7.7)
Neutrophils Relative %: 46 %
Platelets: 73 10*3/uL — ABNORMAL LOW (ref 150–400)
RBC: 2.91 MIL/uL — ABNORMAL LOW (ref 3.87–5.11)
RDW: 16.9 % — ABNORMAL HIGH (ref 11.5–15.5)
WBC: 2.9 10*3/uL — ABNORMAL LOW (ref 4.0–10.5)
nRBC: 0 % (ref 0.0–0.2)

## 2021-04-06 LAB — BASIC METABOLIC PANEL
Anion gap: 8 (ref 5–15)
BUN: 5 mg/dL — ABNORMAL LOW (ref 8–23)
CO2: 28 mmol/L (ref 22–32)
Calcium: 8.6 mg/dL — ABNORMAL LOW (ref 8.9–10.3)
Chloride: 97 mmol/L — ABNORMAL LOW (ref 98–111)
Creatinine, Ser: 0.35 mg/dL — ABNORMAL LOW (ref 0.44–1.00)
GFR, Estimated: >60 mL/min (ref >60)
Glucose, Bld: 141 mg/dL — ABNORMAL HIGH (ref 70–99)
Potassium: 3.5 mmol/L (ref 3.5–5.1)
Sodium: 133 mmol/L — ABNORMAL LOW (ref 135–145)

## 2021-04-06 LAB — MAGNESIUM: Magnesium: 1.1 mg/dL — ABNORMAL LOW (ref 1.7–2.4)

## 2021-04-06 LAB — CULTURE, BLOOD (ROUTINE X 2)
Culture: NO GROWTH
Culture: NO GROWTH
Special Requests: ADEQUATE

## 2021-04-06 LAB — GLUCOSE, CAPILLARY
Glucose-Capillary: 140 mg/dL — ABNORMAL HIGH (ref 70–99)
Glucose-Capillary: 148 mg/dL — ABNORMAL HIGH (ref 70–99)
Glucose-Capillary: 125 mg/dL — ABNORMAL HIGH (ref 70–99)

## 2021-04-06 LAB — PHOSPHORUS: Phosphorus: 2.8 mg/dL (ref 2.5–4.6)

## 2021-04-06 NOTE — Progress Notes (Signed)
PROGRESS NOTE    Yvette Roberts  WER:[id number] DOB: [birth year] DOA: 03/27/2021 PCP: [name], FNP   Brief Narrative:65 year old African-American female with a past medical history metastatic uterine cancer, type 2 diabetes, hypertension malignant pleural effusion admitted with odynophagia and weight loss.  She reports losing 50 to 60 pounds in the last 3 months.  She underwent further work-up and had an EGD and found nonsevere esophagitis in the proximal and distal esophagus as well as scattered mild inflammation with erythema in the entire stomach.    A few nights ago she developed a fever and now she is febrile neutropenic so we will panculture her and start IV cefepime.  Her urine cultured last time to grow out 70,000 colony-forming units of Pseudomonas aeruginosa will cover with IV cefepime and repeat a urinalysis and urine culture. Repeat Urinalysis showed >100,000 GNR.  Patient continues to have some burning and discomfort in her urine so likely this is the source of her infection and will continue Treatment with IV Cefepime     Assessment & Plan:   Principal Problem:   Esophagitis Active Problems:   Type 2 diabetes mellitus without complication (HCC)   Hypertension   Uterine cancer (HCC)   Malignant pleural effusion   Protein-calorie malnutrition, severe (HCC)   Odynophagia   Decreased urine output   Odynophagia and Esophagitis, improving  GERD -GI was consulted and underwent EGD on 03/29/21 which showed mild nonsevere esophagitis in the proximal and distal esophagus as well as scattered mild inflammation with erythema in the entire stomach but she has profound symptoms of Esophagitis  -Her Bx are pending -Continue nystatin add chlorhexidine gluconate, continue Magic mouthwash and lidocaine prior to eating. Stopped IV Dilaudid and increased oxycodone to 10 mg every 6 as needed  -GI recommending continuing Pantoprazole BID and Carafate  1 gram po TIDwm and qHS as well  as advancing diet to Soft and Slowly  Advance diet as tolerated  -Patient to follow up with GI in the outpatient setting    Febrile Neutropenia -Patient had a T-max of 101 on 03/31/21  -No further fevers noted will DC cefepime 04/06/2021 if cultures remain negative. Patient has been started on cefepime and panculture has been done again.  - Pressure Injury 04/05/21 Sacrum Posterior;Medial;Lower Stage 2 -  Partial thickness loss of dermis presenting as a shallow open injury with a red, pink wound bed without slough. Appears to be stage two, bleeding right inside of buttocks and upper part of (Active)  04/05/21 2030  Location: Sacrum  Location Orientation: Posterior;Medial;Lower  Staging: Stage 2 -  Partial thickness loss of dermis presenting as a shallow open injury with a red, pink wound bed without slough.  Wound Description (Comments): Appears to be stage two, bleeding right inside of buttocks and upper part of bottom  Present on Admission: No   Pancytopenia- --Continue monitor and trend and repeat CBC in a.m.   Pseudomonas Aeruginosa UTI continue cefepime.  DC Foley catheter.      Hypokalemia potassium 3.5 replete   Hypomagnesemia resolved -The patient magnesium level is now 2.1  Hypophosphatemia-phosphorus level is 2.0 from 1.7 yesterday with repletion.  Will replete with IV K-Phos again.   Severe Protein Calorie Malnutrition -Nutritionist was consulted -she has just started to eat will need to monitor for refeeding syndrome.  Check electrolytes daily.  Metastatic Uterine Cancer-followed by oncology holding off on further chemo at this time.   Malignant Pleural Effusion -CT Scan showed "Loculated RIGHT-sided pleural fluid  with 6.5 x 3.6 cm axial dimension as compared to 8.1 x 3.9 cm. Basilar volume loss and signs of pleural thickening with decreased pleural thickening, markedly diminished since the prior PET and with continued decreased pleural thickening particularly in the  inferior RIGHT chest even since the recent CT of the chest. Over the RIGHT hemidiaphragm pleural thickness approximately 7 mm as compared to 12 mm greatest thickness. LEFT chest is clear. Airways are patent." with the impression being "Decreased loculated pleural fluid and pleural thickening in the chest follows diminishing perihepatic soft tissue and capsular hepatic implants as described" -No Respiratory Distress    Hypertension -C/w Carvedilol 12.5 mg po BID -Continue to Monitor BP per Protocol  -Last BP reading was 138/90   Diabetes Mellitus Type 2 -Last hemoglobin A1c was 6.1 -Blood sugars ranging from 96-122 on daily BMPs/CMP's -CBGs ranging from 102-145 -continue to monitor blood sugars carefully and if necessary will add sensitive NovoLog sliding scale insulin AC   Anxiety and Depression -C/w Mirtazapine 50 mg p.o. nightly and Fluoxetine 20 mg p.o. daily    Nutrition Problem: Inadequate oral intake Etiology: cancer and cancer related treatments, decreased appetite (odynophagia)  Signs/Symptoms: per patient/family report  Interventions: Ensure Enlive (each supplement provides 350kcal and 20 grams of protein), Refer to RD note for recommendations  Estimated body mass index is 27.37 kg/m as calculated from the following:   Height as of 03/22/21: 5\' 6"  (1.676 m).   Weight as of 03/22/21: 76.9 kg.  DVT prophylaxis: Enoxaparin  Code Status: FULL CODE Family Communication: No family present at bedside  Disposition Plan: Pending further improvement and tolerance of diet and clearance by medical oncology Need to monitor for refeeding sybdrome   Status is: Inpatient   Remains inpatient appropriate because:Unsafe d/c plan, IV treatments appropriate due to intensity of illness or inability to take PO and Inpatient level of care appropriate due to severity of illness     Dispo: The patient is from: Home              Anticipated d/c is to: snf              Patient currently is not  medically stable to d/c.              Difficult to place patient No   Consultants:  Medical Oncology  Gastroenterology    Subjective: She feels better than yesterday swallowing is improving still very weak  Objective: Vitals:   04/05/21 1500 04/05/21 1600 04/05/21 1955 04/06/21 1325  BP:   (!) 145/89 133/80  Pulse:   82 76  Resp: 18 17 18 17   Temp:   98.7 F (37.1 C) 98.3 F (36.8 C)  TempSrc:   Oral Oral  SpO2:   99% 100%    Intake/Output Summary (Last 24 hours) at 04/06/2021 1334 Last data filed at 04/06/2021 0945 Gross per 24 hour  Intake 1971.28 ml  Output 1900 ml  Net 71.28 ml    There were no vitals filed for this visit.  Examination:  General exam: Appears calm and comfortable, port in right upper chest Respiratory system: Clear to auscultation. Respiratory effort normal. Cardiovascular system: S1 & S2 heard, RRR. No JVD, murmurs, rubs, gallops or clicks. No pedal edema. Gastrointestinal system: Abdomen is distended, soft and nontender. No organomegaly or masses felt. Normal bowel sounds heard. Central nervous system: Alert and oriented. No focal neurological deficits. Extremities: Symmetric 5 x 5 power. Skin: No rashes, lesions or ulcers Psychiatry: Judgement  and insight appear normal. Mood & affect appropriate.     Data Reviewed: I have personally reviewed following labs and imaging studies  CBC: Recent Labs  Lab 04/02/21 0506 04/03/21 0758 04/04/21 0500 04/05/21 0500 04/06/21 0533  WBC 1.2* 3.5* 5.2 3.5* 2.9*  NEUTROABS TOO FEW TO COUNT 2.0 3.4 2.0 1.4*  HGB 7.8* 7.9* 7.9* 7.5* 8.0*  HCT 23.5* 24.6* 24.6* 22.9* 24.4*  MCV 84.5 86.3 86.3 85.1 83.8  PLT 54* 74* 74* 71* 73*    Basic Metabolic Panel: Recent Labs  Lab 04/01/21 0459 04/02/21 0506 04/02/21 1630 04/03/21 0647 04/04/21 0500 04/05/21 0500 04/06/21 0533  NA 134* 135 135 135 139 138 133*  K 3.2* 3.5 3.6 3.3* 3.5 3.1* 3.5  CL 94* 98 98 101 103 102 97*  CO2 30 28 29 23 29 30 28    GLUCOSE 130* 101* 112* 82 125* 137* 141*  BUN 6* 12 14 12 10  6* 5*  CREATININE 0.51 0.51 0.49 0.45 0.49 0.39* 0.35*  CALCIUM 8.4* 8.1* 8.1* 8.0* 8.5* 8.5* 8.6*  MG 1.7 2.0  --  2.1 1.6*  --  1.1*  PHOS 2.8 1.9*  --  1.9* 1.7* 2.0* 2.8    GFR: CrCl cannot be calculated (Unknown ideal weight.). Liver Function Tests: Recent Labs  Lab 04/02/21 0506 04/02/21 1630 04/03/21 0647 04/04/21 0500 04/05/21 0500  AST 26 18 15 28 19   ALT 42 37 29 32 26  ALKPHOS 37* 38 38 42 40  BILITOT 1.0 0.7 0.7 0.5 0.6  PROT 5.8* 5.9* 5.3* 5.6* 5.3*  ALBUMIN 2.4* 2.4* 2.2* 2.2* 2.2*    No results for input(s): LIPASE, AMYLASE in the last 168 hours. No results for input(s): AMMONIA in the last 168 hours. Coagulation Profile: No results for input(s): INR, PROTIME in the last 168 hours. Cardiac Enzymes: No results for input(s): CKTOTAL, CKMB, CKMBINDEX, TROPONINI in the last 168 hours. BNP (last 3 results) No results for input(s): PROBNP in the last 8760 hours. HbA1C: No results for input(s): HGBA1C in the last 72 hours. CBG: Recent Labs  Lab 04/05/21 1139 04/05/21 1805 04/05/21 2013 04/06/21 0757 04/06/21 1223  GLUCAP 139* 146* 151* 140* 148*    Lipid Profile: No results for input(s): CHOL, HDL, LDLCALC, TRIG, CHOLHDL, LDLDIRECT in the last 72 hours. Thyroid Function Tests: No results for input(s): TSH, T4TOTAL, FREET4, T3FREE, THYROIDAB in the last 72 hours. Anemia Panel: No results for input(s): VITAMINB12, FOLATE, FERRITIN, TIBC, IRON, RETICCTPCT in the last 72 hours. Sepsis Labs: No results for input(s): PROCALCITON, LATICACIDVEN in the last 168 hours.  Recent Results (from the past 240 hour(s))  SARS CORONAVIRUS 2 (TAT 6-24 HRS) Nasopharyngeal Nasopharyngeal Swab     Status: None   Collection Time: 03/28/21  5:20 AM   Specimen: Nasopharyngeal Swab  Result Value Ref Range Status   SARS Coronavirus 2 NEGATIVE NEGATIVE Final    Comment: (NOTE) SARS-CoV-2 target nucleic acids are  NOT DETECTED.  The SARS-CoV-2 RNA is generally detectable in upper and lower respiratory specimens during the acute phase of infection. Negative results do not preclude SARS-CoV-2 infection, do not rule out co-infections with other pathogens, and should not be used as the sole basis for treatment or other patient management decisions. Negative results must be combined with clinical observations, patient history, and epidemiological information. The expected result is Negative.  Fact Sheet for Patients: SugarRoll.be  Fact Sheet for Healthcare Providers: https://www.woods-Jaque Dacy.com/  This test is not yet approved or cleared by the [place] FDA  and  has been authorized for detection and/or diagnosis of SARS-CoV-2 by FDA under an Emergency Use Authorization (EUA). This EUA will remain  in effect (meaning this test can be used) for the duration of the COVID-19 declaration under Se ction 564(b)(1) of the Act, 21 U.S.C. section 360bbb-3(b)(1), unless the authorization is terminated or revoked sooner.  Performed at [facility], [place] [address] [place], [place] [address]   Culture, Urine     Status: Abnormal   Collection Time: 03/30/21 11:55 AM   Specimen: Urine, Clean Catch  Result Value Ref Range Status   Specimen Description   Final    URINE, CLEAN CATCH Performed at [facility], [place] [address] [place], [place] [address]    Special Requests   Final    NONE Performed at [facility], [place] [address] [place], [place] [address]    Culture 70,000 COLONIES/mL PSEUDOMONAS AERUGINOSA (A)  Final   Report Status 04/01/2021 FINAL  Final   Organism ID, Bacteria PSEUDOMONAS AERUGINOSA (A)  Final      Susceptibility   Pseudomonas aeruginosa - MIC*    CEFTAZIDIME 2 SENSITIVE Sensitive     CIPROFLOXACIN <=0.25 SENSITIVE Sensitive     GENTAMICIN 2 SENSITIVE Sensitive     IMIPENEM 2 SENSITIVE  Sensitive     PIP/TAZO <=4 SENSITIVE Sensitive     CEFEPIME 2 SENSITIVE Sensitive     * 70,000 COLONIES/mL PSEUDOMONAS AERUGINOSA  Culture, Urine     Status: Abnormal   Collection Time: 04/01/21  8:56 AM   Specimen: Urine, Random  Result Value Ref Range Status   Specimen Description   Final    URINE, RANDOM Performed at [facility], [place] [address], [place], [place] [address]    Special Requests   Final    NONE Performed at [facility], [place] [address] [place], [place] [address]    Culture >=100,000 COLONIES/mL PSEUDOMONAS AERUGINOSA (A)  Final   Report Status 04/03/2021 FINAL  Final   Organism ID, Bacteria PSEUDOMONAS AERUGINOSA (A)  Final      Susceptibility   Pseudomonas aeruginosa - MIC*    CEFTAZIDIME 2 SENSITIVE Sensitive     CIPROFLOXACIN <=0.25 SENSITIVE Sensitive     GENTAMICIN 2 SENSITIVE Sensitive     IMIPENEM 2 SENSITIVE Sensitive     PIP/TAZO 8 SENSITIVE Sensitive     CEFEPIME 2 SENSITIVE Sensitive     * >=100,000 COLONIES/mL PSEUDOMONAS AERUGINOSA  Culture, blood (routine x 2)     Status: None   Collection Time: 04/01/21  9:16 AM   Specimen: BLOOD  Result Value Ref Range Status   Specimen Description   Final    BLOOD BLOOD RIGHT HAND Performed at [place] [address] [place], [place] [address]    Special Requests   Final    BOTTLES DRAWN AEROBIC AND ANAEROBIC Blood Culture adequate volume Performed at [place] [address], [place], [place] [address]    Culture   Final    NO GROWTH 5 DAYS Performed at [facility], [place] [address], [place], [place] [address]    Report Status 04/06/2021 FINAL  Final  Culture, blood (routine x 2)     Status: None   Collection Time: 04/01/21 10:38 AM   Specimen: BLOOD  Result Value Ref Range Status   Specimen Description   Final    BLOOD LEFT HAND Performed at [facility], [address] [place],  [place] [address]    Special Requests   Final    BOTTLES DRAWN AEROBIC ONLY Blood Culture results may not be optimal due to an inadequate volume of blood received in culture bottles Performed at [place] [address], [place], [place] [address]    Culture   Final    NO GROWTH 5 DAYS Performed at [facility], [place] [address] [place], [place] [address]    Report Status 04/06/2021 FINAL  Final         Radiology Studies: No results found.      Scheduled Meds:  carvedilol  12.5 mg Oral BID WC   chlorhexidine  15 mL Mouth/Throat QID   Chlorhexidine Gluconate Cloth  6 each Topical Daily   enoxaparin (LOVENOX) injection  40 mg Subcutaneous Q24H   feeding supplement  237 mL Oral TID BM   fentaNYL  1 patch Transdermal Q72H   FLUoxetine  20 mg Oral Daily   gabapentin  200 mg Oral BID   magic mouthwash w/lidocaine  2 mL Oral TID   mouth rinse  15 mL Mouth Rinse q12n4p   melatonin  10 mg Oral QHS   mirtazapine  15 mg Oral QHS   multivitamin with minerals  1 tablet Oral Daily   nystatin  5 mL Oral QID   pantoprazole sodium  40 mg Oral BID   polyethylene glycol  17 g Oral Daily   protein supplement  1 Scoop Oral TID WC   sucralfate  1 g Oral TID WC & HS   Continuous Infusions:  sodium chloride     ceFEPime (MAXIPIME) IV 2 g (04/06/21 1110)   dextrose 5 % and 0.9 % NaCl with KCl 20 mEq/L 75 mL/hr at 04/06/21 0316     LOS: 10 days    Georgette Shell, MD 04/06/2021, 1:34 PM

## 2021-04-06 NOTE — NC FL2 (Signed)
[place] LEVEL OF CARE SCREENING TOOL     IDENTIFICATION  Patient Name: Yvette Roberts Birthdate: 1956/03/28 Sex: female Admission Date (Current Location): 03/27/2021  [facility] and [place] Number:  Herbalist and Address:  [facility],  [place], [place]      Provider Number: [id number]  Attending Physician Name and Address:  Georgette Shell, MD  Relative Name and Phone Number:       Current Level of Care: Hospital Recommended Level of Care: [place] Prior Approval Number:    Date Approved/Denied:   PASRR Number: [id number]  Discharge Plan: SNF    Current Diagnoses: Patient Active Problem List   Diagnosis Date Noted   Decreased urine output    Protein-calorie malnutrition, severe ([place]) 03/27/2021   Odynophagia 03/27/2021   Insomnia 03/13/2021   Esophagitis 03/06/2021   Dysphagia 03/06/2021   Hypomagnesemia 03/01/2021   Hypokalemia 02/08/2021   Other constipation 01/30/2021   Abnormal weight loss 01/30/2021   Cancer associated pain 01/08/2021   Goals of care, counseling/discussion 01/08/2021   Malignant pleural effusion 01/03/2021   Metastasis to liver ([place]) 01/03/2021   Uterine cancer ([place]) 12/20/2020   Acute hypoxemic respiratory failure ([place]) 11/23/2020   Pleural effusion, right 11/23/2020   Atelectasis of right lung 11/23/2020   Hyponatremia 11/23/2020   Anxiety 05/09/2020   Trigger point of right shoulder region 03/09/2019   Cervical radiculopathy at C8 03/09/2019   Unilateral primary osteoarthritis, left knee 11/17/2017   Unilateral primary osteoarthritis, right knee 11/17/2017   Degenerative arthritis of knee, bilateral 07/14/2017   Baker cyst, left 06/16/2017   Pain and swelling of left lower extremity 05/26/2017   Degenerative joint disease of knee, left 05/26/2017   Hyperlipemia 06/12/2016   MDD (major depressive disorder) 06/12/2016   Urticaria 06/12/2016   Type 2  diabetes mellitus without complication ([place]) 10/93/2355   Class II obesity 07/21/2014   Hypertension 07/21/2014   Esophageal reflux 07/21/2014    Orientation RESPIRATION BLADDER Height & Weight     Self, Time, Situation, Place  Normal Continent Weight:   Height:     BEHAVIORAL SYMPTOMS/MOOD NEUROLOGICAL BOWEL NUTRITION STATUS      Incontinent Diet  AMBULATORY STATUS COMMUNICATION OF NEEDS Skin   Extensive Assist Verbally Normal                       Personal Care Assistance Level of Assistance  Bathing, Feeding, Dressing Bathing Assistance: Limited assistance Feeding assistance: Independent Dressing Assistance: Limited assistance     Functional Limitations Info  Sight, Hearing, Speech Sight Info: Adequate Hearing Info: Adequate Speech Info: Adequate    SPECIAL CARE FACTORS FREQUENCY  PT (By licensed PT), OT (By licensed OT)     PT Frequency: 5 x weekly OT Frequency: 5 x weekly            Contractures Contractures Info: Not present    Additional Factors Info  Code Status, Allergies Code Status Info: Full Allergies Info: Eggs, Influenza vaccines, Peanuts, Penicillins, Sulfa drugs, Codeine           Current Medications (04/06/2021):  This is the current hospital active medication list Current Facility-Administered Medications  Medication Dose Route Frequency Provider Last Rate Last Admin   0.9 %  sodium chloride infusion   Intravenous Once Maryanna Shape, NP       acetaminophen (TYLENOL) tablet 650 mg  650 mg Oral Q6H PRN Lang Snow, FNP  650 mg at 04/02/21 0865   Or   acetaminophen (TYLENOL) suppository 650 mg  650 mg Rectal Q4H PRN Lang Snow, FNP       carvedilol (COREG) tablet 12.5 mg  12.5 mg Oral BID WC Domenic Polite, MD   12.5 mg at 04/06/21 0755   ceFEPIme (MAXIPIME) 2 g in sodium chloride 0.9 % 100 mL IVPB  2 g Intravenous Q8H Sheikh, Omair [place], DO 200 mL/hr at 04/06/21 1110 2 g at 04/06/21 1110   chlorhexidine (PERIDEX) 0.12 %  solution 15 mL  15 mL Mouth/Throat QID Georgette Shell, MD   15 mL at 04/06/21 1053   Chlorhexidine Gluconate Cloth 2 % PADS 6 each  6 each Topical Daily Domenic Polite, MD   6 each at 04/06/21 1053   dextrose 5 % and 0.9 % NaCl with KCl 20 mEq/L infusion   Intravenous Continuous Raiford Noble [place], DO 75 mL/hr at 04/06/21 0316 Infusion Verify at 04/06/21 0316   enoxaparin (LOVENOX) injection 40 mg  40 mg Subcutaneous Q24H Domenic Polite, MD   40 mg at 04/04/21 2137   feeding supplement (ENSURE ENLIVE / ENSURE PLUS) liquid 237 mL  237 mL Oral TID BM SheikhGeorgina Quint [place], DO   237 mL at 04/05/21 1947   FLUoxetine (PROZAC) 20 MG/5ML solution 20 mg  20 mg Oral Daily Domenic Polite, MD   20 mg at 04/06/21 1054   gabapentin (NEURONTIN) capsule 200 mg  200 mg Oral BID Domenic Polite, MD   200 mg at 04/06/21 1051   lip balm (CARMEX) ointment 1 application  1 application Topical PRN Vashti Hey, MD       magic mouthwash w/lidocaine  2 mL Oral TID Domenic Polite, MD   2 mL at 04/06/21 1054   MEDLINE mouth rinse  15 mL Mouth Rinse q12n4p Domenic Polite, MD   15 mL at 04/06/21 1112   melatonin tablet 10 mg  10 mg Oral QHS Domenic Polite, MD   10 mg at 04/05/21 2312   mirtazapine (REMERON SOL-TAB) disintegrating tablet 15 mg  15 mg Oral QHS Domenic Polite, MD   15 mg at 04/04/21 2156   multivitamin with minerals tablet 1 tablet  1 tablet Oral Daily Raiford Noble [place], DO   1 tablet at 04/06/21 1051   nystatin (MYCOSTATIN) 100000 UNIT/ML suspension 500,000 Units  5 mL Oral QID Domenic Polite, MD   500,000 Units at 04/06/21 1055   ondansetron (ZOFRAN) tablet 4 mg  4 mg Oral Q6H PRN Domenic Polite, MD   4 mg at 04/05/21 1817   Or   ondansetron (ZOFRAN) injection 4 mg  4 mg Intravenous Q6H PRN Domenic Polite, MD   4 mg at 04/05/21 7846   oxyCODONE (Oxy IR/ROXICODONE) immediate release tablet 10 mg  10 mg Oral Q6H PRN Georgette Shell, MD   10 mg at 04/06/21 1051   pantoprazole  sodium (PROTONIX) 40 mg/20 mL oral suspension 40 mg  40 mg Oral BID Adrian Saran, RPH   40 mg at 04/06/21 1100   phenol (CHLORASEPTIC) mouth spray 1 spray  1 spray Mouth/Throat PRN Domenic Polite, MD   1 spray at 03/30/21 0800   polyethylene glycol (MIRALAX / GLYCOLAX) packet 17 g  17 g Oral Daily Domenic Polite, MD   17 g at 04/02/21 0929   protein supplement (RESOURCE BENEPROTEIN) powder packet 6 g  1 Scoop Oral TID WC Sheikh, Georgina Quint [place], DO   6 g at 04/06/21 [phone]  sucralfate (CARAFATE) tablet 1 g  1 g Oral TID WC & HS Brahmbhatt, Parag, MD   1 g at 04/06/21 1259     Discharge Medications: Please see discharge summary for a list of discharge medications.  Relevant Imaging Results:  Relevant Lab Results:   Additional Information ss# [id number]  Leeroy Cha, RN

## 2021-04-06 NOTE — TOC Progression Note (Signed)
Transition of Care [facility]) - Progression Note    Patient Details  Name: Yvette Roberts MRN: [id number] Date of Birth: 1956/05/09  [facility] CM/SW Contact  Leeroy Cha, RN Phone Number: 04/06/2021, 1:43 PM  Clinical Narrative:    [place] number rec'd and fl2 updated, faxed out to area snf in case patient needs to go to snf for rehab and not home.   Expected Discharge Plan: Skilled Nursing Facility Barriers to Discharge: Continued Medical Work up  Expected Discharge Plan and Services Expected Discharge Plan: [place]   Discharge Planning Services: CM Consult   Living arrangements for the past 2 months: Apartment                                       Social Determinants of Health (SDOH) Interventions    Readmission Risk Interventions Readmission Risk Prevention Plan 04/05/2021  Transportation Screening Complete  Medication Review Press photographer) Complete  PCP or Specialist appointment within 3-5 days of discharge Complete  HRI or Home Care Consult Complete  SW Recovery Care/Counseling Consult Complete  Palliative Care Screening Not [place] Complete  Some recent data might be hidden

## 2021-04-07 LAB — CBC WITH DIFFERENTIAL/PLATELET
Abs Immature Granulocytes: 0.05 10*3/uL (ref 0.00–0.07)
Basophils Absolute: 0 10*3/uL (ref 0.0–0.1)
Basophils Relative: 0 %
Eosinophils Absolute: 0 10*3/uL (ref 0.0–0.5)
Eosinophils Relative: 0 %
HCT: 23.3 % — ABNORMAL LOW (ref 36.0–46.0)
Hemoglobin: 7.8 g/dL — ABNORMAL LOW (ref 12.0–15.0)
Immature Granulocytes: 2 %
Lymphocytes Relative: 30 %
Lymphs Abs: 1 10*3/uL (ref 0.7–4.0)
MCH: 28.1 pg (ref 26.0–34.0)
MCHC: 33.5 g/dL (ref 30.0–36.0)
MCV: 83.8 fL (ref 80.0–100.0)
Monocytes Absolute: 0.6 10*3/uL (ref 0.1–1.0)
Monocytes Relative: 17 %
Neutro Abs: 1.7 10*3/uL (ref 1.7–7.7)
Neutrophils Relative %: 51 %
Platelets: 67 10*3/uL — ABNORMAL LOW (ref 150–400)
RBC: 2.78 MIL/uL — ABNORMAL LOW (ref 3.87–5.11)
RDW: 16.7 % — ABNORMAL HIGH (ref 11.5–15.5)
WBC: 3.4 10*3/uL — ABNORMAL LOW (ref 4.0–10.5)
nRBC: 0 % (ref 0.0–0.2)

## 2021-04-07 LAB — BASIC METABOLIC PANEL
Anion gap: 9 (ref 5–15)
BUN: 7 mg/dL — ABNORMAL LOW (ref 8–23)
CO2: 26 mmol/L (ref 22–32)
Calcium: 8.9 mg/dL (ref 8.9–10.3)
Chloride: 97 mmol/L — ABNORMAL LOW (ref 98–111)
Creatinine, Ser: <0.3 mg/dL — ABNORMAL LOW (ref 0.44–1.00)
Glucose, Bld: 119 mg/dL — ABNORMAL HIGH (ref 70–99)
Potassium: 3.2 mmol/L — ABNORMAL LOW (ref 3.5–5.1)
Sodium: 132 mmol/L — ABNORMAL LOW (ref 135–145)

## 2021-04-07 LAB — PHOSPHORUS: Phosphorus: 2.3 mg/dL — ABNORMAL LOW (ref 2.5–4.6)

## 2021-04-07 LAB — MAGNESIUM: Magnesium: 1.1 mg/dL — ABNORMAL LOW (ref 1.7–2.4)

## 2021-04-07 MED ORDER — MAGNESIUM SULFATE 4 GM/100ML IV SOLN
4.0000 g | Freq: Once | INTRAVENOUS | Status: AC
  Administered 2021-04-07: 4 g via INTRAVENOUS
  Filled 2021-04-07: qty 100

## 2021-04-07 MED ORDER — POTASSIUM CHLORIDE CRYS ER 20 MEQ PO TBCR
40.0000 meq | EXTENDED_RELEASE_TABLET | Freq: Once | ORAL | Status: AC
  Administered 2021-04-07: 40 meq via ORAL
  Filled 2021-04-07: qty 2

## 2021-04-07 MED ORDER — POTASSIUM PHOSPHATES 15 MMOLE/5ML IV SOLN
30.0000 mmol | Freq: Once | INTRAVENOUS | Status: AC
  Administered 2021-04-07: 30 mmol via INTRAVENOUS
  Filled 2021-04-07: qty 10

## 2021-04-07 NOTE — Progress Notes (Signed)
When I entered her room this morning, she was sleeping I did not wake her up Reviewed the plan of care with hospitalist She is eating better with plan to go to skilled nursing facility next week I will reach out to patient's family after discharge and we will see her back in the outpatient office when ready

## 2021-04-07 NOTE — Progress Notes (Addendum)
PROGRESS NOTE    Yvette Roberts  XVQ:[id number] DOB: 25-Dec-1955 DOA: 03/27/2021 PCP: [name], FNP   Brief Narrative:65 year old African-American female with a past medical history metastatic uterine cancer, type 2 diabetes, hypertension malignant pleural effusion admitted with odynophagia and weight loss.  She reports losing 50 to 60 pounds in the last 3 months.  She underwent further work-up and had an EGD and found nonsevere esophagitis in the proximal and distal esophagus as well as scattered mild inflammation with erythema in the entire stomach.    A few nights ago she developed a fever so she was pancultured and started on IV cefepime.     Assessment & Plan:   Principal Problem:   Esophagitis Active Problems:   Type 2 diabetes mellitus without complication (HCC)   Hypertension   Uterine cancer (HCC)   Malignant pleural effusion   Protein-calorie malnutrition, severe (HCC)   Odynophagia   Decreased urine output   Odynophagia and Esophagitis, improving  GERD -GI was consulted and underwent EGD on 03/29/21 which showed mild nonsevere esophagitis in the proximal and distal esophagus as well as scattered mild inflammation with erythema in the entire stomach but she has profound symptoms of Esophagitis  -Her Bx are pending -Continue nystatin add chlorhexidine gluconate, continue Magic mouthwash and lidocaine prior to eating. Stopped IV Dilaudid and increased oxycodone to 10 mg every 6 as needed  -GI recommending continuing Pantoprazole BID and Carafate  1 gram po TIDwm and qHS as well as advancing diet to Soft and Slowly  Advance diet as tolerated  -Patient to follow up with GI in the outpatient setting    Febrile Neutropenia-no further fevers she finished a course of cefepime for 7 days.  Cefepime was stopped on 04/07/2021.  - Pressure Injury 04/05/21 Sacrum Posterior;Medial;Lower Stage 2 -  Partial thickness loss of dermis presenting as a shallow open injury with a red,  pink wound bed without slough. Appears to be stage two, bleeding right inside of buttocks and upper part of (Active)  04/05/21 2030  Location: Sacrum  Location Orientation: Posterior;Medial;Lower  Staging: Stage 2 -  Partial thickness loss of dermis presenting as a shallow open injury with a red, pink wound bed without slough.  Wound Description (Comments): Appears to be stage two, bleeding right inside of buttocks and upper part of bottom  Present on Admission: No    Pancytopenia- --Continue monitor and trend and repeat CBC in a.m.   Pseudomonas Aeruginosa UTI finished a course of cefepime.  Hypokalemia potassium 3.2 replete   Hypomagnesemia 1.1 Replete  Hypophosphatemia-phosphorus level is 2.3 replete with K-Phos    Severe Protein Calorie Malnutrition -Nutritionist was consulted -she has just started to eat will need to monitor for refeeding syndrome.  Check electrolytes daily.  Metastatic Uterine Cancer-followed by oncology holding off on further chemo at this time.   Malignant Pleural Effusion -CT Scan showed "Loculated RIGHT-sided pleural fluid with 6.5 x 3.6 cm axial dimension as compared to 8.1 x 3.9 cm. Basilar volume loss and signs of pleural thickening with decreased pleural thickening, markedly diminished since the prior PET and with continued decreased pleural thickening particularly in the inferior RIGHT chest even since the recent CT of the chest. Over the RIGHT hemidiaphragm pleural thickness approximately 7 mm as compared to 12 mm greatest thickness. LEFT chest is clear. Airways are patent." with the impression being "Decreased loculated pleural fluid and pleural thickening in the chest follows diminishing perihepatic soft tissue and capsular hepatic implants as described" -  No Respiratory Distress    Hypertension stable at 120/79 on carvedilol 12.5 twice daily.   Diabetes Mellitus Type 2-CBG (last 3)  Recent Labs    04/06/21 0757 04/06/21 1223 04/06/21 1804  GLUCAP  140* 148* 125*    -Last hemoglobin A1c was 6.1 -Continue SSI  Anxiety and Depression -C/w Mirtazapine 50 mg p.o. nightly and Fluoxetine 20 mg p.o. daily    Nutrition Problem: Inadequate oral intake Etiology: cancer and cancer related treatments, decreased appetite (odynophagia)  Signs/Symptoms: per patient/family report  Interventions: Ensure Enlive (each supplement provides 350kcal and 20 grams of protein), Refer to RD note for recommendations  Estimated body mass index is 27.37 kg/m as calculated from the following:   Height as of 03/22/21: 5\' 6"  (1.676 m).   Weight as of 03/22/21: 76.9 kg.  DVT prophylaxis: Enoxaparin  Code Status: FULL CODE Family Communication: No family present at bedside  Disposition Plan: Pending further improvement and tolerance of diet and clearance by medical oncology Need to monitor for refeeding sybdrome   Status is: Inpatient   Remains inpatient appropriate because:Unsafe d/c plan, IV treatments appropriate due to intensity of illness or inability to take PO and Inpatient level of care appropriate due to severity of illness     Dispo: The patient is from: Home              Anticipated d/c is to: snf              Patient currently is not medically stable to d/c.              Difficult to place patient No   Consultants:  Medical Oncology  Gastroenterology    Subjective: Resting in bed she is agreeable to go to SNF   Objective: Vitals:   04/06/21 1325 04/06/21 2109 04/07/21 0532 04/07/21 1439  BP: 133/80 (!) 142/89 (!) 147/82 120/79  Pulse: 76 85 88 90  Resp: 17 16 16 16   Temp: 98.3 F (36.8 C) 98.2 F (36.8 C) 98.3 F (36.8 C) 98.7 F (37.1 C)  TempSrc: Oral Oral Oral Oral  SpO2: 100% 97% 96% 100%    Intake/Output Summary (Last 24 hours) at 04/07/2021 1452 Last data filed at 04/07/2021 0532 Gross per 24 hour  Intake 0 ml  Output 1200 ml  Net -1200 ml    There were no vitals filed for this visit.  Examination:  General  exam: Appears calm and comfortable, port in right upper chest Respiratory system: Clear to auscultation. Respiratory effort normal. Cardiovascular system: S1 & S2 heard, RRR. No JVD, murmurs, rubs, gallops or clicks. No pedal edema. Gastrointestinal system: Abdomen is distended, soft and nontender. No organomegaly or masses felt. Normal bowel sounds heard. Central nervous system: Alert and oriented. No focal neurological deficits. Extremities: Symmetric 5 x 5 power. Skin: No rashes, lesions or ulcers Psychiatry: Judgement and insight appear normal. Mood & affect appropriate.     Data Reviewed: I have personally reviewed following labs and imaging studies  CBC: Recent Labs  Lab 04/03/21 0758 04/04/21 0500 04/05/21 0500 04/06/21 0533 04/07/21 0630  WBC 3.5* 5.2 3.5* 2.9* 3.4*  NEUTROABS 2.0 3.4 2.0 1.4* 1.7  HGB 7.9* 7.9* 7.5* 8.0* 7.8*  HCT 24.6* 24.6* 22.9* 24.4* 23.3*  MCV 86.3 86.3 85.1 83.8 83.8  PLT 74* 74* 71* 73* 67*    Basic Metabolic Panel: Recent Labs  Lab 04/02/21 0506 04/02/21 1630 04/03/21 0647 04/04/21 0500 04/05/21 0500 04/06/21 0533 04/07/21 0630  NA 135   < >  135 139 138 133* 132*  K 3.5   < > 3.3* 3.5 3.1* 3.5 3.2*  CL 98   < > 101 103 102 97* 97*  CO2 28   < > 23 29 30 28 26   GLUCOSE 101*   < > 82 125* 137* 141* 119*  BUN 12   < > 12 10 6* 5* 7*  CREATININE 0.51   < > 0.45 0.49 0.39* 0.35* <0.30*  CALCIUM 8.1*   < > 8.0* 8.5* 8.5* 8.6* 8.9  MG 2.0  --  2.1 1.6*  --  1.1* 1.1*  PHOS 1.9*  --  1.9* 1.7* 2.0* 2.8 2.3*   < > = values in this interval not displayed.    GFR: CrCl cannot be calculated (This lab value cannot be used to calculate CrCl because it is not a number: <0.30). Liver Function Tests: Recent Labs  Lab 04/02/21 0506 04/02/21 1630 04/03/21 0647 04/04/21 0500 04/05/21 0500  AST 26 18 15 28 19   ALT 42 37 29 32 26  ALKPHOS 37* 38 38 42 40  BILITOT 1.0 0.7 0.7 0.5 0.6  PROT 5.8* 5.9* 5.3* 5.6* 5.3*  ALBUMIN 2.4* 2.4* 2.2*  2.2* 2.2*    No results for input(s): LIPASE, AMYLASE in the last 168 hours. No results for input(s): AMMONIA in the last 168 hours. Coagulation Profile: No results for input(s): INR, PROTIME in the last 168 hours. Cardiac Enzymes: No results for input(s): CKTOTAL, CKMB, CKMBINDEX, TROPONINI in the last 168 hours. BNP (last 3 results) No results for input(s): PROBNP in the last 8760 hours. HbA1C: No results for input(s): HGBA1C in the last 72 hours. CBG: Recent Labs  Lab 04/05/21 1805 04/05/21 2013 04/06/21 0757 04/06/21 1223 04/06/21 1804  GLUCAP 146* 151* 140* 148* 125*    Lipid Profile: No results for input(s): CHOL, HDL, LDLCALC, TRIG, CHOLHDL, LDLDIRECT in the last 72 hours. Thyroid Function Tests: No results for input(s): TSH, T4TOTAL, FREET4, T3FREE, THYROIDAB in the last 72 hours. Anemia Panel: No results for input(s): VITAMINB12, FOLATE, FERRITIN, TIBC, IRON, RETICCTPCT in the last 72 hours. Sepsis Labs: No results for input(s): PROCALCITON, LATICACIDVEN in the last 168 hours.  Recent Results (from the past 240 hour(s))  Culture, Urine     Status: Abnormal   Collection Time: 03/30/21 11:55 AM   Specimen: Urine, Clean Catch  Result Value Ref Range Status   Specimen Description   Final    URINE, CLEAN CATCH Performed at [facility], [place] [address] [place], [place] [address]    Special Requests   Final    NONE Performed at [facility], [place] [address] [place], [place] [address]    Culture 70,000 COLONIES/mL PSEUDOMONAS AERUGINOSA (A)  Final   Report Status 04/01/2021 FINAL  Final   Organism ID, Bacteria PSEUDOMONAS AERUGINOSA (A)  Final      Susceptibility   Pseudomonas aeruginosa - MIC*    CEFTAZIDIME 2 SENSITIVE Sensitive     CIPROFLOXACIN <=0.25 SENSITIVE Sensitive     GENTAMICIN 2 SENSITIVE Sensitive     IMIPENEM 2 SENSITIVE Sensitive     PIP/TAZO <=4 SENSITIVE Sensitive     CEFEPIME 2 SENSITIVE Sensitive      * 70,000 COLONIES/mL PSEUDOMONAS AERUGINOSA  Culture, Urine     Status: Abnormal   Collection Time: 04/01/21  8:56 AM   Specimen: Urine, Random  Result Value Ref Range Status   Specimen Description   Final    URINE, RANDOM Performed  at [facility], [place] [address], [place], [place] [address]    Special Requests   Final    NONE Performed at [facility], [place] [address] [place], [place] [address]    Culture >=100,000 COLONIES/mL PSEUDOMONAS AERUGINOSA (A)  Final   Report Status 04/03/2021 FINAL  Final   Organism ID, Bacteria PSEUDOMONAS AERUGINOSA (A)  Final      Susceptibility   Pseudomonas aeruginosa - MIC*    CEFTAZIDIME 2 SENSITIVE Sensitive     CIPROFLOXACIN <=0.25 SENSITIVE Sensitive     GENTAMICIN 2 SENSITIVE Sensitive     IMIPENEM 2 SENSITIVE Sensitive     PIP/TAZO 8 SENSITIVE Sensitive     CEFEPIME 2 SENSITIVE Sensitive     * >=100,000 COLONIES/mL PSEUDOMONAS AERUGINOSA  Culture, blood (routine x 2)     Status: None   Collection Time: 04/01/21  9:16 AM   Specimen: BLOOD  Result Value Ref Range Status   Specimen Description   Final    BLOOD BLOOD RIGHT HAND Performed at [place] [address], [place], [place] [address]    Special Requests   Final    BOTTLES DRAWN AEROBIC AND ANAEROBIC Blood Culture adequate volume Performed at [place] [address] [place], [place] [address]    Culture   Final    NO GROWTH 5 DAYS Performed at [facility], [place] [address] [place], [place] [address]    Report Status 04/06/2021 FINAL  Final  Culture, blood (routine x 2)     Status: None   Collection Time: 04/01/21 10:38 AM   Specimen: BLOOD  Result Value Ref Range Status   Specimen Description   Final    BLOOD LEFT HAND Performed at [place] [address] [place], [place] [address]    Special Requests   Final    BOTTLES DRAWN AEROBIC ONLY Blood Culture  results may not be optimal due to an inadequate volume of blood received in culture bottles Performed at [place] [address] [place], [place] [address]    Culture   Final    NO GROWTH 5 DAYS Performed at [facility], [place] [address], [place], [place] [address]    Report Status 04/06/2021 FINAL  Final         Radiology Studies: No results found.      Scheduled Meds:  carvedilol  12.5 mg Oral BID WC   chlorhexidine  15 mL Mouth/Throat QID   Chlorhexidine Gluconate Cloth  6 each Topical Daily   enoxaparin (LOVENOX) injection  40 mg Subcutaneous Q24H   feeding supplement  237 mL Oral TID BM   FLUoxetine  20 mg Oral Daily   gabapentin  200 mg Oral BID   magic mouthwash w/lidocaine  2 mL Oral TID   mouth rinse  15 mL Mouth Rinse q12n4p   melatonin  10 mg Oral QHS   mirtazapine  15 mg Oral QHS   multivitamin with minerals  1 tablet Oral Daily   nystatin  5 mL Oral QID   pantoprazole sodium  40 mg Oral BID   polyethylene glycol  17 g Oral Daily   protein supplement  1 Scoop Oral TID WC   sucralfate  1 g Oral TID WC & HS   Continuous Infusions:  dextrose 5 % and 0.9 % NaCl with KCl 20 mEq/L 75 mL/hr at 04/06/21 0316     LOS: 11  days    Georgette Shell, MD 04/07/2021, 2:52 PM

## 2021-04-07 NOTE — Progress Notes (Signed)
PT Cancellation Note  Patient Details Name: Yvette Roberts MRN: [id number] DOB: Jul 01, 1956   Cancelled Treatment:    Reason Eval/Treat Not Completed: Fatigue/lethargy limiting ability to participate (pt stated she's fatigued after having visitors and needs to rest now. Will follow.)   Philomena Doheny PT 04/07/2021  Acute Rehabilitation Services Pager [phone] Office [phone]

## 2021-04-08 LAB — DIFFERENTIAL
Abs Immature Granulocytes: 0.02 10*3/uL (ref 0.00–0.07)
Basophils Absolute: 0 10*3/uL (ref 0.0–0.1)
Basophils Relative: 0 %
Eosinophils Absolute: 0 10*3/uL (ref 0.0–0.5)
Eosinophils Relative: 0 %
Immature Granulocytes: 1 %
Lymphocytes Relative: 36 %
Lymphs Abs: 1 10*3/uL (ref 0.7–4.0)
Monocytes Absolute: 0.5 10*3/uL (ref 0.1–1.0)
Monocytes Relative: 17 %
Neutro Abs: 1.3 10*3/uL — ABNORMAL LOW (ref 1.7–7.7)
Neutrophils Relative %: 46 %

## 2021-04-08 LAB — BASIC METABOLIC PANEL
Anion gap: 6 (ref 5–15)
BUN: 5 mg/dL — ABNORMAL LOW (ref 8–23)
CO2: 26 mmol/L (ref 22–32)
Calcium: 8 mg/dL — ABNORMAL LOW (ref 8.9–10.3)
Chloride: 103 mmol/L (ref 98–111)
Creatinine, Ser: 0.33 mg/dL — ABNORMAL LOW (ref 0.44–1.00)
GFR, Estimated: >60 mL/min (ref >60)
Glucose, Bld: 120 mg/dL — ABNORMAL HIGH (ref 70–99)
Potassium: 3 mmol/L — ABNORMAL LOW (ref 3.5–5.1)
Sodium: 135 mmol/L (ref 135–145)

## 2021-04-08 LAB — PHOSPHORUS: Phosphorus: 2.8 mg/dL (ref 2.5–4.6)

## 2021-04-08 LAB — CBC
HCT: 23.5 % — ABNORMAL LOW (ref 36.0–46.0)
Hemoglobin: 7.8 g/dL — ABNORMAL LOW (ref 12.0–15.0)
MCH: 27.9 pg (ref 26.0–34.0)
MCHC: 33.2 g/dL (ref 30.0–36.0)
MCV: 83.9 fL (ref 80.0–100.0)
Platelets: 72 10*3/uL — ABNORMAL LOW (ref 150–400)
RBC: 2.8 MIL/uL — ABNORMAL LOW (ref 3.87–5.11)
RDW: 16.8 % — ABNORMAL HIGH (ref 11.5–15.5)
WBC: 2.8 10*3/uL — ABNORMAL LOW (ref 4.0–10.5)
nRBC: 0 % (ref 0.0–0.2)

## 2021-04-08 LAB — MAGNESIUM: Magnesium: 1.5 mg/dL — ABNORMAL LOW (ref 1.7–2.4)

## 2021-04-08 MED ORDER — POTASSIUM CHLORIDE CRYS ER 20 MEQ PO TBCR: 80.0000 meq | EXTENDED_RELEASE_TABLET | Freq: Once | ORAL | Status: DC

## 2021-04-08 MED ORDER — MAGNESIUM OXIDE -MG SUPPLEMENT 400 (240 MG) MG PO TABS
800.0000 mg | ORAL_TABLET | Freq: Every day | ORAL | Status: DC
  Administered 2021-04-08 – 2021-04-28 (×21): 800 mg via ORAL
  Filled 2021-04-08 (×21): qty 2

## 2021-04-08 MED ORDER — POTASSIUM CHLORIDE CRYS ER 20 MEQ PO TBCR
60.0000 meq | EXTENDED_RELEASE_TABLET | Freq: Once | ORAL | Status: AC
  Administered 2021-04-08: 60 meq via ORAL
  Filled 2021-04-08: qty 3

## 2021-04-08 MED ORDER — MAGNESIUM SULFATE 4 GM/100ML IV SOLN
4.0000 g | Freq: Once | INTRAVENOUS | Status: AC
  Administered 2021-04-08: 4 g via INTRAVENOUS
  Filled 2021-04-08: qty 100

## 2021-04-08 MED ORDER — POTASSIUM CHLORIDE CRYS ER 20 MEQ PO TBCR
40.0000 meq | EXTENDED_RELEASE_TABLET | Freq: Every day | ORAL | Status: AC
  Administered 2021-04-08 – 2021-04-09 (×2): 40 meq via ORAL
  Filled 2021-04-08 (×2): qty 2

## 2021-04-08 NOTE — Progress Notes (Signed)
Yvette Roberts   DOB:[birth year]   UT#:[id number]    ASSESSMENT & PLAN:  Metastatic Uterine cancer ([place]) I have personally reviewed CT imaging She has good response to treatment Continue supportive care: I have cancelled all her future treatment until she is fully recovered   Esophagitis, resolved  Dysphagia, resolving   Cancer associated pain, improving This is improved with addition of fentanyl patch I reviewed the plan of care with hospitalist She may or may not be able to afford fentanyl patch upon discharge Prior to admission to the hospital, she is only using oxycodone as needed I am hopeful we can wean her off fentanyl patch prior to discharge   Abnormal weight loss Her oral intake remain poor Continue to encourage the patient to eat as much as possible    Acquired pancytopenia, stable No transfusion is needed  Stage I decubitus ulcer Due to poor mobility Continue wound care  Severe electrolyte imbalance Due to poor oral intake Continue replacement therapy   Discharge planning  I agree with hospital discharge to skilled nursing facility when available I was not able to update the family today I will sign off I will arrange outpatient follow-up in a month after discharge  All questions were answered. The patient knows to call the clinic with any problems, questions or concerns.   The total time spent in the appointment was 15 minutes encounter with patients including review of chart and various tests results, discussions about plan of care and coordination of care plan  [name], MD 04/08/2021 9:34 AM  Subjective:  She is seen briefly The patient has profound weakness She require assistance and nursing care for most activities of daily living including cleaning herself She is noted to have stage I decubitus ulcer on the left right buttock cheek and skin changes in the sacral area  Objective:  Vitals:   04/07/21 2012 04/08/21 0514  BP: 139/81 135/79  Pulse:  93 89  Resp: 17 16  Temp: 98 F (36.7 C) 98.2 F (36.8 C)  SpO2: 95% 99%     Intake/Output Summary (Last 24 hours) at 04/08/2021 0934 Last data filed at 04/08/2021 0515 Gross per 24 hour  Intake 1945.38 ml  Output 1150 ml  Net 795.38 ml    GENERAL:alert, no distress and comfortable SKIN: Noted decubitus ulcer    Labs:  Recent Labs    11/23/20 0324 11/23/20 0421 04/03/21 0647 04/04/21 0500 04/05/21 0500 04/06/21 0533 04/07/21 0630 04/08/21 0523  NA 132*   < > 135 139 138 133* 132* 135  K 4.5   < > 3.3* 3.5 3.1* 3.5 3.2* 3.0*  CL 96*   < > 101 103 102 97* 97* 103  CO2 22   < > 23 29 30 28 26 26   GLUCOSE 212*   < > 82 125* 137* 141* 119* 120*  BUN 21   < > 12 10 6* 5* 7* 5*  CREATININE 1.28*   < > 0.45 0.49 0.39* 0.35* <0.30* 0.33*  CALCIUM 9.5   < > 8.0* 8.5* 8.5* 8.6* 8.9 8.0*  GFRNONAA 46*   < > >60 >60 >60 >60 NOT CALCULATED >60  PROT 8.2*   < > 5.3* 5.6* 5.3*  --   --   --   ALBUMIN 3.4*   < > 2.2* 2.2* 2.2*  --   --   --   AST 23   < > 15 28 19   --   --   --  ALT 23   < > 29 32 26  --   --   --   ALKPHOS 63   < > 38 42 40  --   --   --   BILITOT 0.9   < > 0.7 0.5 0.6  --   --   --   BILIDIR 0.2  --   --   --   --   --   --   --   IBILI 0.7  --   --   --   --   --   --   --    < > = values in this interval not displayed.    Studies:  DG Chest 2 View  Result Date: 03/27/2021 CLINICAL DATA:  65 year old female with hypertension and diabetes. Stage IV uterine cancer. EXAM: CHEST - 2 VIEW COMPARISON:  Chest radiograph dated 02/25/2021. FINDINGS: Right-sided Port-A-Cath similar position. Small right pleural effusion similar to prior radiograph. Interval decrease in the size of the opacity seen in the right mid lung field. The left lung is clear. No pneumothorax. Stable cardiomediastinal silhouette. Degenerative changes of the spine. No acute osseous pathology. IMPRESSION: Decrease in the size of opacity in the right mid lung field compared to prior radiograph.  Unchanged small right pleural effusion. Electronically Signed   By: Anner Crete M.D.   On: 03/27/2021 19:23   CT CHEST ABDOMEN PELVIS W CONTRAST  Result Date: 03/28/2021 CLINICAL DATA:  Ovarian cancer assess treatment response. EXAM: CT CHEST, ABDOMEN, AND PELVIS WITH CONTRAST TECHNIQUE: Multidetector CT imaging of the chest, abdomen and pelvis was performed following the standard protocol during bolus administration of intravenous contrast. CONTRAST:  115mL OMNIPAQUE IOHEXOL 300 MG/ML  SOLN COMPARISON:  PET-CT from December 08, 2020 and CT of the chest of Feb 25, 2021. FINDINGS: CT CHEST FINDINGS Cardiovascular: RIGHT-sided Port-A-Cath terminates in the lower RIGHT atrium similar to recent CT imaging, perhaps influenced by arm position as on the chest x-ray the tip is at the caval to atrial junction. Heart size is normal without substantial pericardial effusion. Normal caliber of the thoracic aorta. Normal caliber of the central pulmonary vasculature. Mediastinum/Nodes: No thoracic inlet adenopathy. No axillary adenopathy. No mediastinal lymphadenopathy. Esophagus mildly patulous. Lungs/Pleura: Loculated RIGHT-sided pleural fluid with 6.5 x 3.6 cm axial dimension as compared to 8.1 x 3.9 cm. Basilar volume loss and signs of pleural thickening with decreased pleural thickening, markedly diminished since the prior PET and with continued decreased pleural thickening particularly in the inferior RIGHT chest even since the recent CT of the chest. Over the RIGHT hemidiaphragm pleural thickness approximately 7 mm as compared to 12 mm greatest thickness. LEFT chest is clear. Airways are patent. Musculoskeletal: See below for full musculoskeletal details. CT ABDOMEN PELVIS FINDINGS Hepatobiliary: Low-density lesions along the cephalad margin of the RIGHT hemi liver and across the top of the RIGHT hemidiaphragm and diminished in size considerably compared to the PET exam of February of 2022. Pleural thickening across  the surface of the liver also with decreased thickness (image 47/2) 7 mm greatest thickness as compared to 14 mm greatest thickness. Area of low attenuation along the capsule of the liver (image 45/2) overlying hepatic subsegment Roman numeral 8 measuring 18 mm as compared to 20 mm on the most recent comparison and is much as 43 mm on the prior CT evaluation. Capsular implant with extension into hepatic parenchyma much less conspicuous today than on the previous PET-CT from February and the CT from Feb 25, 2021. This area measuring approximately 1.8 as compared to 2.4 cm based on comparison with the most recent study. Much smaller than on the study of February of 2022 where it measured approximately 41 mm. Cystic area along the gallbladder fossa and in the inferior aspect of the RIGHT hemi liver with stable appearance largest area measuring 3 cm. Portal vein is patent. Hepatic veins are patent. Cholelithiasis without pericholecystic stranding. No sign of biliary duct distension. Pancreas: Normal, without mass, inflammation or ductal dilatation. Spleen: Spleen normal size and contour. Adrenals/Urinary Tract: Adrenal glands are normal. Symmetric renal enhancement. No hydronephrosis. Smooth contour the urinary bladder. Stomach/Bowel: No acute gastrointestinal process soft tissue adjacent to the appendix in the RIGHT lower quadrant without signs of adjacent stranding is an unchanged caliber of the appendix dating back to February 2021 in the setting of known peritoneal disease with diminished fluid in the pelvis and omental and serosal nodularity throughout the abdomen. Vascular/Lymphatic: Normal caliber abdominal aorta and IVC. There is no gastrohepatic or hepatoduodenal ligament lymphadenopathy. No retroperitoneal or mesenteric lymphadenopathy. Reproductive: Decreased bulky appearance of the uterus. RIGHT adnexal cystic and solid area measuring 4.5 x 2.5 cm as compared to 5.9 x 3.1 cm. Decreased ascites in the pelvis.  Other: Decreased ascites as above. Decreased signs of omental nodularity and serosal nodularity. Liver capsular disease as described above. Discrete nodules in the LEFT omentum persist, largest approximately 6 mm (image 88/2) previously 6-7 mm. Focal thickening of the LEFT rectus muscle is diminished no measurable lesion in this area, thickness of the LEFT rectus muscle on image 103 of series 2 is 13 mm as compared to 24 mm. The adjacent omental and peritoneal nodularity and stranding is subjectively diminished since the prior study. Musculoskeletal: Spinal degenerative changes. New sclerotic focus in the RIGHT proximal femur in the femoral neck and head junction measuring 14 mm. New sclerotic focus at the T12 level (image 60/2) 13 mm. Sclerosis at the T12 level is present however on the study of May of 2022. Not seen on the PET exam of February 2022. Spinal degenerative changes. IMPRESSION: 1. Decreased loculated pleural fluid and pleural thickening in the chest follows diminishing perihepatic soft tissue and capsular hepatic implants as described. 2. No adenopathy by size criteria in the abdomen or pelvis. Minimal soft tissue in the intra-aortocaval groove is all that remains on image 77 of series 2 in the retroperitoneum. 3. Decreased bulk of the uterus subjectively and of RIGHT adnexal structures as described. 4. New areas of bony sclerosis compatible with skeletal metastasis. No signs of increased FDG uptake and no visible sclerosis on the previous PET. In the context of improvement of other disease it is possible that this represents treated metastasis. Would suggest attention on follow-up. 5. Loculated RIGHT-sided pleural fluid with signs of pleural thickening in the lower RIGHT chest otherwise with similar appearance. 6. Cholelithiasis without evidence of acute cholecystitis. 7. Aortic atherosclerosis. Electronically Signed   By: Zetta Bills M.D.   On: 03/28/2021 15:52   DG CHEST PORT 1 VIEW  Result  Date: 04/01/2021 CLINICAL DATA:  Neutropenic fever EXAM: PORTABLE CHEST 1 VIEW COMPARISON:  March 31, 2021 FINDINGS: Stable small right-sided loculated pleural effusion. Hazy opacification of the right hemithorax with fluid in the minor fissure likely representing a small free-flowing pleural effusion. Atelectasis in the right lung base. Left lung is clear. Right chest wall Port-A-Cath with tip overlying the right atrium. The heart size and mediastinal contours are unchanged. Thoracic spondylosis with degenerative changes shoulders.  IMPRESSION: Stable right-sided pleural fluid with adjacent atelectasis. Left lung is clear. Electronically Signed   By: Dahlia Bailiff MD   On: 04/01/2021 11:37   DG CHEST PORT 1 VIEW  Result Date: 03/31/2021 CLINICAL DATA:  Shortness of breath.  History of ovarian carcinoma EXAM: PORTABLE CHEST 1 VIEW COMPARISON:  Chest radiograph Mar 27, 2021; chest CT March 28, 2021 FINDINGS: Apparent loculated pleural effusion on the right remains. There is also an apparent free-flowing small right pleural effusion, not appreciably changed from recent CT. Atelectasis right base. Left lung clear. Heart is upper normal in size with pulmonary vascularity normal. Port-A-Cath tip is in the right atrium slightly beyond the superior vena cava-right atrium junction. No adenopathy appreciable. Degenerative change in each shoulder noted. No pneumothorax. IMPRESSION: Pleural effusion on the right, primarily loculated, essentially stable compared to recent CT examination. Right base atelectasis noted. Left lung clear. No new opacity evident. Stable cardiac silhouette. Port-A-Cath present on the right. Electronically Signed   By: Lowella Grip III M.D.   On: 03/31/2021 08:12

## 2021-04-08 NOTE — Progress Notes (Signed)
PROGRESS NOTE    Yvette Roberts  EUM:[id number] DOB: 06/09/1956 DOA: 03/27/2021 PCP: [name], FNP   Brief Narrative:65 year old African-American female with a past medical history metastatic uterine cancer, type 2 diabetes, hypertension malignant pleural effusion admitted with odynophagia and weight loss.  She reports losing 50 to 60 pounds in the last 3 months.  She underwent further work-up and had an EGD and found nonsevere esophagitis in the proximal and distal esophagus as well as scattered mild inflammation with erythema in the entire stomach.    A few nights ago she developed a fever so she was pancultured and started on IV cefepime.     Assessment & Plan:   Principal Problem:   Esophagitis Active Problems:   Type 2 diabetes mellitus without complication (HCC)   Hypertension   Uterine cancer (HCC)   Malignant pleural effusion   Protein-calorie malnutrition, severe (HCC)   Odynophagia   Decreased urine output   Odynophagia and Esophagitis, improving  GERD -GI was consulted and underwent EGD on 03/29/21 which showed mild nonsevere esophagitis in the proximal and distal esophagus as well as scattered mild inflammation with erythema in the entire stomach but she has profound symptoms of Esophagitis  -Her Bx are pending -Continue nystatin add chlorhexidine gluconate, continue Magic mouthwash and lidocaine prior to eating. Stopped IV Dilaudid and increased oxycodone to 10 mg every 6 as needed  -GI recommending continuing Pantoprazole BID and Carafate  1 gram po TIDwm and qHS as well as advancing diet to Soft and Slowly  Advance diet as tolerated  -Patient to follow up with GI in the outpatient setting    Febrile Neutropenia-no further fevers she finished a course of cefepime for 7 days.  Cefepime was stopped on 04/07/2021.  - Pressure Injury 04/05/21 Sacrum Posterior;Medial;Lower Stage 2 -  Partial thickness loss of dermis presenting as a shallow open injury with a red,  pink wound bed without slough. Appears to be stage two, bleeding right inside of buttocks and upper part of (Active)  04/05/21 2030  Location: Sacrum  Location Orientation: Posterior;Medial;Lower  Staging: Stage 2 -  Partial thickness loss of dermis presenting as a shallow open injury with a red, pink wound bed without slough.  Wound Description (Comments): Appears to be stage two, bleeding right inside of buttocks and upper part of bottom  Present on Admission: No    Pancytopenia- --Continue monitor and trend and repeat CBC in a.m.   Pseudomonas Aeruginosa UTI finished a course of cefepime.  Hypokalemia potassium 3.0 replete   Hypomagnesemia 1.5 Replete  Hypophosphatemia-phosphorus level is 2.5 repleted  Refeeding syndrome monitor electrolytes daily and replace  Severe Protein Calorie Malnutrition -Nutritionist was consulted -she has just started to eat will need to monitor for refeeding syndrome.  Check electrolytes daily.  Metastatic Uterine Cancer-followed by oncology holding off on further chemo at this time.   Malignant Pleural Effusion -CT Scan showed "Loculated RIGHT-sided pleural fluid with 6.5 x 3.6 cm axial dimension as compared to 8.1 x 3.9 cm. Basilar volume loss and signs of pleural thickening with decreased pleural thickening, markedly diminished since the prior PET and with continued decreased pleural thickening particularly in the inferior RIGHT chest even since the recent CT of the chest. Over the RIGHT hemidiaphragm pleural thickness approximately 7 mm as compared to 12 mm greatest thickness. LEFT chest is clear. Airways are patent." with the impression being "Decreased loculated pleural fluid and pleural thickening in the chest follows diminishing perihepatic soft tissue and capsular  hepatic implants as described" -No Respiratory Distress    Hypertension stable at 120/79 on carvedilol 12.5 twice daily.   Diabetes Mellitus Type 2-CBG (last 3)  Recent Labs     04/06/21 0757 04/06/21 1223 04/06/21 1804  GLUCAP 140* 148* 125*     -Last hemoglobin A1c was 6.1 -Continue SSI  Anxiety and Depression -C/w Mirtazapine 50 mg p.o. nightly and Fluoxetine 20 mg p.o. daily  -Stage I decubitus this was unavoidable due to severe protein calorie malnutrition/esophageal/dysphagia and odynophagia/    Nutrition Problem: Inadequate oral intake Etiology: cancer and cancer related treatments, decreased appetite (odynophagia)  Signs/Symptoms: per patient/family report  Interventions: Ensure Enlive (each supplement provides 350kcal and 20 grams of protein), Refer to RD note for recommendations  Estimated body mass index is 27.37 kg/m as calculated from the following:   Height as of 03/22/21: 5\' 6"  (1.676 m).   Weight as of 03/22/21: 76.9 kg.  DVT prophylaxis: Enoxaparin  Code Status: FULL CODE Family Communication: No family present at bedside  Disposition Plan: Pending further improvement and tolerance of diet and clearance by medical oncology Need to monitor for refeeding sybdrome   Status is: Inpatient   Remains inpatient appropriate because:Unsafe d/c plan, IV treatments appropriate due to intensity of illness or inability to take PO and Inpatient level of care appropriate due to severity of illness     Dispo: The patient is from: Home              Anticipated d/c is to: snf              Patient currently is not medically stable to d/c.              Difficult to place patient No   Consultants:  Medical Oncology  Gastroenterology    Subjective:   She is just waking up and resting in bed denies any specific complaints today she is having bowel movements that she is eating Electrolytes still low and being repleted Objective: Vitals:   04/07/21 0532 04/07/21 1439 04/07/21 2012 04/08/21 0514  BP: (!) 147/82 120/79 139/81 135/79  Pulse: 88 90 93 89  Resp: 16 16 17 16   Temp: 98.3 F (36.8 C) 98.7 F (37.1 C) 98 F (36.7 C) 98.2 F (36.8  C)  TempSrc: Oral Oral Oral Oral  SpO2: 96% 100% 95% 99%    Intake/Output Summary (Last 24 hours) at 04/08/2021 1107 Last data filed at 04/08/2021 0515 Gross per 24 hour  Intake 1945.38 ml  Output 1150 ml  Net 795.38 ml    There were no vitals filed for this visit.  Examination:  General exam: Appears calm and comfortable, port in right upper chest Respiratory system: Clear to auscultation. Respiratory effort normal. Cardiovascular system: S1 & S2 heard, RRR. No JVD, murmurs, rubs, gallops or clicks. No pedal edema. Gastrointestinal system: Abdomen is distended, soft and nontender. No organomegaly or masses felt. Normal bowel sounds heard. Central nervous system: Alert and oriented. No focal neurological deficits. Extremities: Symmetric 5 x 5 power. Skin: No rashes, lesions or ulcers Psychiatry: Judgement and insight appear normal. Mood & affect appropriate.     Data Reviewed: I have personally reviewed following labs and imaging studies  CBC: Recent Labs  Lab 04/04/21 0500 04/05/21 0500 04/06/21 0533 04/07/21 0630 04/08/21 1000  WBC 5.2 3.5* 2.9* 3.4* 2.8*  NEUTROABS 3.4 2.0 1.4* 1.7 1.3*  HGB 7.9* 7.5* 8.0* 7.8* 7.8*  HCT 24.6* 22.9* 24.4* 23.3* 23.5*  MCV 86.3 85.1  83.8 83.8 83.9  PLT 74* 71* 73* 67* 72*    Basic Metabolic Panel: Recent Labs  Lab 04/03/21 0647 04/04/21 0500 04/05/21 0500 04/06/21 0533 04/07/21 0630 04/08/21 0523  NA 135 139 138 133* 132* 135  K 3.3* 3.5 3.1* 3.5 3.2* 3.0*  CL 101 103 102 97* 97* 103  CO2 23 29 30 28 26 26   GLUCOSE 82 125* 137* 141* 119* 120*  BUN 12 10 6* 5* 7* 5*  CREATININE 0.45 0.49 0.39* 0.35* <0.30* 0.33*  CALCIUM 8.0* 8.5* 8.5* 8.6* 8.9 8.0*  MG 2.1 1.6*  --  1.1* 1.1* 1.5*  PHOS 1.9* 1.7* 2.0* 2.8 2.3* 2.8    GFR: CrCl cannot be calculated (Unknown ideal weight.). Liver Function Tests: Recent Labs  Lab 04/02/21 0506 04/02/21 1630 04/03/21 0647 04/04/21 0500 04/05/21 0500  AST 26 18 15 28 19   ALT  42 37 29 32 26  ALKPHOS 37* 38 38 42 40  BILITOT 1.0 0.7 0.7 0.5 0.6  PROT 5.8* 5.9* 5.3* 5.6* 5.3*  ALBUMIN 2.4* 2.4* 2.2* 2.2* 2.2*    No results for input(s): LIPASE, AMYLASE in the last 168 hours. No results for input(s): AMMONIA in the last 168 hours. Coagulation Profile: No results for input(s): INR, PROTIME in the last 168 hours. Cardiac Enzymes: No results for input(s): CKTOTAL, CKMB, CKMBINDEX, TROPONINI in the last 168 hours. BNP (last 3 results) No results for input(s): PROBNP in the last 8760 hours. HbA1C: No results for input(s): HGBA1C in the last 72 hours. CBG: Recent Labs  Lab 04/05/21 1805 04/05/21 2013 04/06/21 0757 04/06/21 1223 04/06/21 1804  GLUCAP 146* 151* 140* 148* 125*    Lipid Profile: No results for input(s): CHOL, HDL, LDLCALC, TRIG, CHOLHDL, LDLDIRECT in the last 72 hours. Thyroid Function Tests: No results for input(s): TSH, T4TOTAL, FREET4, T3FREE, THYROIDAB in the last 72 hours. Anemia Panel: No results for input(s): VITAMINB12, FOLATE, FERRITIN, TIBC, IRON, RETICCTPCT in the last 72 hours. Sepsis Labs: No results for input(s): PROCALCITON, LATICACIDVEN in the last 168 hours.  Recent Results (from the past 240 hour(s))  Culture, Urine     Status: Abnormal   Collection Time: 03/30/21 11:55 AM   Specimen: Urine, Clean Catch  Result Value Ref Range Status   Specimen Description   Final    URINE, CLEAN CATCH Performed at [facility], [place] [address] [place], [place] [address]    Special Requests   Final    NONE Performed at [facility], [place] [address] [place], [place] [address]    Culture 70,000 COLONIES/mL PSEUDOMONAS AERUGINOSA (A)  Final   Report Status 04/01/2021 FINAL  Final   Organism ID, Bacteria PSEUDOMONAS AERUGINOSA (A)  Final      Susceptibility   Pseudomonas aeruginosa - MIC*    CEFTAZIDIME 2 SENSITIVE Sensitive     CIPROFLOXACIN <=0.25 SENSITIVE Sensitive     GENTAMICIN 2  SENSITIVE Sensitive     IMIPENEM 2 SENSITIVE Sensitive     PIP/TAZO <=4 SENSITIVE Sensitive     CEFEPIME 2 SENSITIVE Sensitive     * 70,000 COLONIES/mL PSEUDOMONAS AERUGINOSA  Culture, Urine     Status: Abnormal   Collection Time: 04/01/21  8:56 AM   Specimen: Urine, Random  Result Value Ref Range Status   Specimen Description   Final    URINE, RANDOM Performed at [facility], [place] [address], [place], [place] [address]    Special Requests   Final  NONE Performed at [facility], [place] [address] [place], [place] [address]    Culture >=100,000 COLONIES/mL PSEUDOMONAS AERUGINOSA (A)  Final   Report Status 04/03/2021 FINAL  Final   Organism ID, Bacteria PSEUDOMONAS AERUGINOSA (A)  Final      Susceptibility   Pseudomonas aeruginosa - MIC*    CEFTAZIDIME 2 SENSITIVE Sensitive     CIPROFLOXACIN <=0.25 SENSITIVE Sensitive     GENTAMICIN 2 SENSITIVE Sensitive     IMIPENEM 2 SENSITIVE Sensitive     PIP/TAZO 8 SENSITIVE Sensitive     CEFEPIME 2 SENSITIVE Sensitive     * >=100,000 COLONIES/mL PSEUDOMONAS AERUGINOSA  Culture, blood (routine x 2)     Status: None   Collection Time: 04/01/21  9:16 AM   Specimen: BLOOD  Result Value Ref Range Status   Specimen Description   Final    BLOOD BLOOD RIGHT HAND Performed at [facility] [address] [place], [place] [address]    Special Requests   Final    BOTTLES DRAWN AEROBIC AND ANAEROBIC Blood Culture adequate volume Performed at [facility] [address] [place], [place] [address]    Culture   Final    NO GROWTH 5 DAYS Performed at [facility], [place] [address], [place], [place] [address]    Report Status 04/06/2021 FINAL  Final  Culture, blood (routine x 2)     Status: None   Collection Time: 04/01/21 10:38 AM   Specimen: BLOOD  Result Value Ref Range Status   Specimen Description   Final    BLOOD LEFT HAND Performed at [facility] [address], [place], [place] [address]    Special Requests   Final    BOTTLES DRAWN AEROBIC ONLY Blood Culture results may not be optimal due to an inadequate volume of blood received in culture bottles Performed at [facility] [address], [place], [place] [address]    Culture   Final    NO GROWTH 5 DAYS Performed at [facility], [place] [address], [place], [place] [address]    Report Status 04/06/2021 FINAL  Final         Radiology Studies: No results found.      Scheduled Meds:  carvedilol  12.5 mg Oral BID WC   chlorhexidine  15 mL Mouth/Throat QID   Chlorhexidine Gluconate Cloth  6 each Topical Daily   enoxaparin (LOVENOX) injection  40 mg Subcutaneous Q24H   feeding supplement  237 mL Oral TID BM   FLUoxetine  20 mg Oral Daily   gabapentin  200 mg Oral BID   magic mouthwash w/lidocaine  2 mL Oral TID   mouth rinse  15 mL Mouth Rinse q12n4p   melatonin  10 mg Oral QHS   mirtazapine  15 mg Oral QHS   multivitamin with minerals  1 tablet Oral Daily   nystatin  5 mL Oral QID   pantoprazole sodium  40 mg Oral BID   polyethylene glycol  17 g Oral Daily   protein supplement  1 Scoop Oral TID WC   sucralfate  1 g Oral TID WC & HS   Continuous Infusions:  dextrose 5 % and 0.9 % NaCl with KCl 20 mEq/L 75 mL/hr at 04/08/21 0323     LOS: 12 days    Georgette Shell, MD 04/08/2021, 11:07 AM

## 2021-04-09 ENCOUNTER — Telehealth: Payer: Self-pay

## 2021-04-09 LAB — BASIC METABOLIC PANEL
Anion gap: 6 (ref 5–15)
BUN: <5 mg/dL — ABNORMAL LOW (ref 8–23)
CO2: 27 mmol/L (ref 22–32)
Calcium: 8.2 mg/dL — ABNORMAL LOW (ref 8.9–10.3)
Chloride: 104 mmol/L (ref 98–111)
Creatinine, Ser: 0.39 mg/dL — ABNORMAL LOW (ref 0.44–1.00)
GFR, Estimated: >60 mL/min (ref >60)
Glucose, Bld: 101 mg/dL — ABNORMAL HIGH (ref 70–99)
Potassium: 3.3 mmol/L — ABNORMAL LOW (ref 3.5–5.1)
Sodium: 137 mmol/L (ref 135–145)

## 2021-04-09 LAB — CBC WITH DIFFERENTIAL/PLATELET
Abs Immature Granulocytes: 0.04 10*3/uL (ref 0.00–0.07)
Basophils Absolute: 0 10*3/uL (ref 0.0–0.1)
Basophils Relative: 0 %
Eosinophils Absolute: 0 10*3/uL (ref 0.0–0.5)
Eosinophils Relative: 0 %
HCT: 22.7 % — ABNORMAL LOW (ref 36.0–46.0)
Hemoglobin: 7.3 g/dL — ABNORMAL LOW (ref 12.0–15.0)
Immature Granulocytes: 1 %
Lymphocytes Relative: 32 %
Lymphs Abs: 1.1 10*3/uL (ref 0.7–4.0)
MCH: 27.3 pg (ref 26.0–34.0)
MCHC: 32.2 g/dL (ref 30.0–36.0)
MCV: 85 fL (ref 80.0–100.0)
Monocytes Absolute: 0.5 10*3/uL (ref 0.1–1.0)
Monocytes Relative: 15 %
Neutro Abs: 1.7 10*3/uL (ref 1.7–7.7)
Neutrophils Relative %: 52 %
Platelets: 72 10*3/uL — ABNORMAL LOW (ref 150–400)
RBC: 2.67 MIL/uL — ABNORMAL LOW (ref 3.87–5.11)
RDW: 17.2 % — ABNORMAL HIGH (ref 11.5–15.5)
WBC: 3.3 10*3/uL — ABNORMAL LOW (ref 4.0–10.5)
nRBC: 0 % (ref 0.0–0.2)

## 2021-04-09 LAB — GLUCOSE, CAPILLARY
Glucose-Capillary: 103 mg/dL — ABNORMAL HIGH (ref 70–99)
Glucose-Capillary: 109 mg/dL — ABNORMAL HIGH (ref 70–99)
Glucose-Capillary: 100 mg/dL — ABNORMAL HIGH (ref 70–99)

## 2021-04-09 LAB — PHOSPHORUS: Phosphorus: 2.2 mg/dL — ABNORMAL LOW (ref 2.5–4.6)

## 2021-04-09 LAB — MAGNESIUM: Magnesium: 1.6 mg/dL — ABNORMAL LOW (ref 1.7–2.4)

## 2021-04-09 LAB — PREPARE RBC (CROSSMATCH)

## 2021-04-09 LAB — ABO/RH: ABO/RH(D): A POS

## 2021-04-09 LAB — SARS CORONAVIRUS 2 (TAT 6-24 HRS): SARS Coronavirus 2: NEGATIVE

## 2021-04-09 MED ORDER — HEPARIN SOD (PORK) LOCK FLUSH 100 UNIT/ML IV SOLN
500.0000 [IU] | INTRAVENOUS | Status: AC | PRN
  Administered 2021-04-10: 500 [IU]
  Filled 2021-04-09: qty 5

## 2021-04-09 MED ORDER — MAGNESIUM SULFATE 4 GM/100ML IV SOLN
4.0000 g | Freq: Once | INTRAVENOUS | Status: AC
  Administered 2021-04-09: 4 g via INTRAVENOUS
  Filled 2021-04-09: qty 100

## 2021-04-09 MED ORDER — MAGNESIUM OXIDE -MG SUPPLEMENT 400 (240 MG) MG PO TABS: 800.0000 mg | ORAL_TABLET | Freq: Two times a day (BID) | ORAL | Status: DC

## 2021-04-09 MED ORDER — POTASSIUM CHLORIDE CRYS ER 20 MEQ PO TBCR
40.0000 meq | EXTENDED_RELEASE_TABLET | Freq: Every day | ORAL | Status: DC
  Administered 2021-04-10 – 2021-04-28 (×19): 40 meq via ORAL
  Filled 2021-04-09 (×19): qty 2

## 2021-04-09 MED ORDER — POTASSIUM CHLORIDE CRYS ER 20 MEQ PO TBCR
60.0000 meq | EXTENDED_RELEASE_TABLET | Freq: Once | ORAL | Status: AC
  Administered 2021-04-09: 60 meq via ORAL
  Filled 2021-04-09: qty 3

## 2021-04-09 MED ORDER — SODIUM CHLORIDE 0.9% IV SOLUTION: Freq: Once | INTRAVENOUS | Status: AC

## 2021-04-09 NOTE — NC FL2 (Signed)
[place] LEVEL OF CARE SCREENING TOOL     IDENTIFICATION  Patient Name: Yvette Roberts Birthdate: 06/16/1956 Sex: female Admission Date (Current Location): 03/27/2021  [facility] and [place] Number:  Herbalist and Address:  [facility],  [place], [place]      Provider Number: [id number]  Attending Physician Name and Address:  Georgette Shell, MD  Relative Name and Phone Number:       Current Level of Care: Hospital Recommended Level of Care: [place] Prior Approval Number:    Date Approved/Denied:   PASRR Number: [id number]  Discharge Plan: SNF    Current Diagnoses: Patient Active Problem List   Diagnosis Date Noted   Decreased urine output    Protein-calorie malnutrition, severe ([place]) 03/27/2021   Odynophagia 03/27/2021   Insomnia 03/13/2021   Esophagitis 03/06/2021   Dysphagia 03/06/2021   Hypomagnesemia 03/01/2021   Hypokalemia 02/08/2021   Other constipation 01/30/2021   Abnormal weight loss 01/30/2021   Cancer associated pain 01/08/2021   Goals of care, counseling/discussion 01/08/2021   Malignant pleural effusion 01/03/2021   Metastasis to liver ([place]) 01/03/2021   Uterine cancer ([place]) 12/20/2020   Acute hypoxemic respiratory failure ([place]) 11/23/2020   Pleural effusion, right 11/23/2020   Atelectasis of right lung 11/23/2020   Hyponatremia 11/23/2020   Anxiety 05/09/2020   Trigger point of right shoulder region 03/09/2019   Cervical radiculopathy at C8 03/09/2019   Unilateral primary osteoarthritis, left knee 11/17/2017   Unilateral primary osteoarthritis, right knee 11/17/2017   Degenerative arthritis of knee, bilateral 07/14/2017   Baker cyst, left 06/16/2017   Pain and swelling of left lower extremity 05/26/2017   Degenerative joint disease of knee, left 05/26/2017   Hyperlipemia 06/12/2016   MDD (major depressive disorder) 06/12/2016   Urticaria 06/12/2016   Type 2  diabetes mellitus without complication ([place]) 44/12/4740   Class II obesity 07/21/2014   Hypertension 07/21/2014   Esophageal reflux 07/21/2014    Orientation RESPIRATION BLADDER Height & Weight     Self, Time, Situation, Place  Normal Continent Weight:   Height:     BEHAVIORAL SYMPTOMS/MOOD NEUROLOGICAL BOWEL NUTRITION STATUS      Incontinent Diet  AMBULATORY STATUS COMMUNICATION OF NEEDS Skin   Extensive Assist Verbally Normal                       Personal Care Assistance Level of Assistance  Bathing, Feeding, Dressing Bathing Assistance: Limited assistance Feeding assistance: Independent Dressing Assistance: Limited assistance     Functional Limitations Info  Sight, Hearing, Speech Sight Info: Adequate Hearing Info: Adequate Speech Info: Adequate    SPECIAL CARE FACTORS FREQUENCY  PT (By licensed PT), OT (By licensed OT)     PT Frequency: 5 x weekly OT Frequency: 5 x weekly            Contractures Contractures Info: Not present    Additional Factors Info  Code Status, Allergies Code Status Info: Full Allergies Info: Eggs, Influenza vaccines, Peanuts, Penicillins, Sulfa drugs, Codeine           Current Medications (04/09/2021):  This is the current hospital active medication list Current Facility-Administered Medications  Medication Dose Route Frequency Provider Last Rate Last Admin   acetaminophen (TYLENOL) tablet 650 mg  650 mg Oral Q6H PRN Lang Snow, FNP   650 mg at 04/08/21 0745   Or   acetaminophen (TYLENOL) suppository 650 mg  650 mg Rectal  Q4H PRN Lang Snow, FNP       carvedilol (COREG) tablet 12.5 mg  12.5 mg Oral BID WC Domenic Polite, MD   12.5 mg at 04/09/21 0815   chlorhexidine (PERIDEX) 0.12 % solution 15 mL  15 mL Mouth/Throat QID Georgette Shell, MD   15 mL at 04/09/21 1028   Chlorhexidine Gluconate Cloth 2 % PADS 6 each  6 each Topical Daily Domenic Polite, MD   6 each at 04/09/21 1029   enoxaparin (LOVENOX) injection  40 mg  40 mg Subcutaneous Q24H Domenic Polite, MD   40 mg at 04/08/21 2151   feeding supplement (ENSURE ENLIVE / ENSURE PLUS) liquid 237 mL  237 mL Oral TID BM SheikhGeorgina Quint [place], DO   237 mL at 04/08/21 1916   FLUoxetine (PROZAC) 20 MG/5ML solution 20 mg  20 mg Oral Daily Domenic Polite, MD   20 mg at 04/09/21 1028   gabapentin (NEURONTIN) capsule 200 mg  200 mg Oral BID Domenic Polite, MD   200 mg at 04/09/21 1028   heparin lock flush 100 unit/mL  500 Units Intracatheter Prior to discharge Georgette Shell, MD       lip balm (CARMEX) ointment 1 application  1 application Topical PRN Vashti Hey, MD       magic mouthwash w/lidocaine  2 mL Oral TID Domenic Polite, MD   2 mL at 04/09/21 1028   magnesium oxide (MAG-OX) tablet 800 mg  800 mg Oral Daily Georgette Shell, MD   800 mg at 04/09/21 1028   MEDLINE mouth rinse  15 mL Mouth Rinse q12n4p Domenic Polite, MD   15 mL at 04/06/21 1112   melatonin tablet 10 mg  10 mg Oral QHS Domenic Polite, MD   10 mg at 04/08/21 2151   mirtazapine (REMERON SOL-TAB) disintegrating tablet 15 mg  15 mg Oral QHS Domenic Polite, MD   15 mg at 04/08/21 2150   multivitamin with minerals tablet 1 tablet  1 tablet Oral Daily Raiford Noble [place], DO   1 tablet at 04/09/21 1028   ondansetron (ZOFRAN) tablet 4 mg  4 mg Oral Q6H PRN Domenic Polite, MD   4 mg at 04/05/21 1817   Or   ondansetron (ZOFRAN) injection 4 mg  4 mg Intravenous Q6H PRN Domenic Polite, MD   4 mg at 04/05/21 6415   oxyCODONE (Oxy IR/ROXICODONE) immediate release tablet 10 mg  10 mg Oral Q6H PRN Georgette Shell, MD   10 mg at 04/08/21 2151   pantoprazole sodium (PROTONIX) 40 mg/20 mL oral suspension 40 mg  40 mg Oral BID Adrian Saran, RPH   40 mg at 04/09/21 1034   phenol (CHLORASEPTIC) mouth spray 1 spray  1 spray Mouth/Throat PRN Domenic Polite, MD   1 spray at 03/30/21 0800   polyethylene glycol (MIRALAX / GLYCOLAX) packet 17 g  17 g Oral Daily Domenic Polite,  MD   17 g at 04/09/21 1028   protein supplement (RESOURCE BENEPROTEIN) powder packet 6 g  1 Scoop Oral TID WC Sheikh, Omair [place], DO   6 g at 04/09/21 0819   sucralfate (CARAFATE) tablet 1 g  1 g Oral TID WC & HS Brahmbhatt, Parag, MD   1 g at 04/09/21 1220     Discharge Medications: Please see discharge summary for a list of discharge medications.  Relevant Imaging Results:  Relevant Lab Results:   Additional Information ss# [id number]  Leeroy Cha, RN

## 2021-04-09 NOTE — Telephone Encounter (Signed)
[facility] regarding canceling oxygen. Faxed order to d/c oxygen per [place] to [phone]. Received confirmation.

## 2021-04-09 NOTE — Progress Notes (Signed)
PROGRESS NOTE    Yvette Roberts  IRW:[id number] DOB: [birth year] DOA: 03/27/2021 PCP: [name], FNP   Brief Narrative:65 year old African-American female with a past medical history metastatic uterine cancer, type 2 diabetes, hypertension malignant pleural effusion admitted with odynophagia and weight loss.  She reports losing 50 to 60 pounds in the last 3 months.  She underwent further work-up and had an EGD and found nonsevere esophagitis in the proximal and distal esophagus as well as scattered mild inflammation with erythema in the entire stomach.    A few nights ago she developed a fever so she was pancultured and started on IV cefepime.     Assessment & Plan:   Principal Problem:   Esophagitis Active Problems:   Type 2 diabetes mellitus without complication (HCC)   Hypertension   Uterine cancer (HCC)   Malignant pleural effusion   Protein-calorie malnutrition, severe (HCC)   Odynophagia   Decreased urine output   Odynophagia and Esophagitis, improving  GERD -GI was consulted and underwent EGD on 03/29/21 which showed mild nonsevere esophagitis in the proximal and distal esophagus as well as scattered mild inflammation with erythema in the entire stomach but she has profound symptoms of Esophagitis  -Her Bx are pending -Continue nystatin add chlorhexidine gluconate, continue Magic mouthwash and lidocaine prior to eating. Stopped IV Dilaudid and increased oxycodone to 10 mg every 6 as needed  -GI recommending continuing Pantoprazole BID and Carafate  1 gram po TIDwm and qHS as well as advancing diet to Soft and Slowly  Advance diet as tolerated  -Patient to follow up with GI in the outpatient setting    Febrile Neutropenia-no further fevers she finished a course of cefepime for 7 days.  Cefepime was stopped on 04/07/2021.  - Pressure Injury 04/05/21 Sacrum Posterior;Medial;Lower Stage 2 -  Partial thickness loss of dermis presenting as a shallow open injury with a red,  pink wound bed without slough. Appears to be stage two, bleeding right inside of buttocks and upper part of (Active)  04/05/21 2030  Location: Sacrum  Location Orientation: Posterior;Medial;Lower  Staging: Stage 2 -  Partial thickness loss of dermis presenting as a shallow open injury with a red, pink wound bed without slough.  Wound Description (Comments): Appears to be stage two, bleeding right inside of buttocks and upper part of bottom  Present on Admission: No    Pancytopenia-hemoglobin 7.8 transfuse 1 unit today. --Continue monitor and trend and repeat CBC in a.m.   Pseudomonas Aeruginosa UTI finished a course of cefepime.  Hypokalemia potassium 3.0 replete   Hypomagnesemia 1.5 Replete  Hypophosphatemia-phosphorus level is 2.5 repleted  Refeeding syndrome monitor electrolytes daily and replace  Severe Protein Calorie Malnutrition -Nutritionist was consulted -she has just started to eat will need to monitor for refeeding syndrome.  Check electrolytes daily.  Metastatic Uterine Cancer-followed by oncology holding off on further chemo at this time.   Malignant Pleural Effusion -CT Scan showed "Loculated RIGHT-sided pleural fluid with 6.5 x 3.6 cm axial dimension as compared to 8.1 x 3.9 cm. Basilar volume loss and signs of pleural thickening with decreased pleural thickening, markedly diminished since the prior PET and with continued decreased pleural thickening particularly in the inferior RIGHT chest even since the recent CT of the chest. Over the RIGHT hemidiaphragm pleural thickness approximately 7 mm as compared to 12 mm greatest thickness. LEFT chest is clear. Airways are patent." with the impression being "Decreased loculated pleural fluid and pleural thickening in the chest follows diminishing  perihepatic soft tissue and capsular hepatic implants as described" -No Respiratory Distress    Hypertension stable at 120/79 on carvedilol 12.5 twice daily.   Diabetes Mellitus Type  2-CBG (last 3)  Recent Labs    04/06/21 1804 04/09/21 0741 04/09/21 1205  GLUCAP 125* 103* 109*     -Last hemoglobin A1c was 6.1 -Continue SSI  Anxiety and Depression -C/w Mirtazapine 50 mg p.o. nightly and Fluoxetine 20 mg p.o. daily  -Stage I decubitus this was unavoidable due to severe protein calorie malnutrition/esophageal/dysphagia and odynophagia  Disposition-await SNF no bed available today.  COVID SNF sent.    Nutrition Problem: Inadequate oral intake Etiology: cancer and cancer related treatments, decreased appetite (odynophagia)  Signs/Symptoms: per patient/family report  Interventions: Ensure Enlive (each supplement provides 350kcal and 20 grams of protein), Refer to RD note for recommendations  Estimated body mass index is 27.37 kg/m as calculated from the following:   Height as of 03/22/21: 5\' 6"  (1.676 m).   Weight as of 03/22/21: 76.9 kg.  DVT prophylaxis: Enoxaparin  Code Status: FULL CODE Family Communication: No family present at bedside  Disposition Plan: Pending further improvement and tolerance of diet and clearance by medical oncology Need to monitor for refeeding sybdrome   Status is: Inpatient   Remains inpatient appropriate because:Unsafe d/c plan, IV treatments appropriate due to intensity of illness or inability to take PO and Inpatient level of care appropriate due to severity of illness     Dispo: The patient is from: Home              Anticipated d/c is to: snf              Patient currently is not medically stable to d/c.              Difficult to place patient No   Consultants:  Medical Oncology  Gastroenterology    Subjective:   She is just waking up and resting in bed denies any specific complaints today she is having bowel movements that she is eating Electrolytes still low and being repleted Objective: Vitals:   04/08/21 1458 04/08/21 2102 04/09/21 0525 04/09/21 1346  BP: 130/86 126/76 (!) 146/97 125/74  Pulse: 100 94 94  85  Resp: 16 16 19 17   Temp: 97.8 F (36.6 C) 98.7 F (37.1 C) 99.6 F (37.6 C) 98.5 F (36.9 C)  TempSrc: Oral Oral Oral Oral  SpO2: 96% 96% 92% 97%    Intake/Output Summary (Last 24 hours) at 04/09/2021 1603 Last data filed at 04/09/2021 1555 Gross per 24 hour  Intake 236 ml  Output 2200 ml  Net -1964 ml    There were no vitals filed for this visit.  Examination:  General exam: Appears calm and comfortable, port in right upper chest Respiratory system: Clear to auscultation. Respiratory effort normal. Cardiovascular system: S1 & S2 heard, RRR. No JVD, murmurs, rubs, gallops or clicks. No pedal edema. Gastrointestinal system: Abdomen is distended, soft and nontender. No organomegaly or masses felt. Normal bowel sounds heard. Central nervous system: Alert and oriented. No focal neurological deficits. Extremities: Symmetric 5 x 5 power. Skin: No rashes, lesions or ulcers Psychiatry: Judgement and insight appear normal. Mood & affect appropriate.     Data Reviewed: I have personally reviewed following labs and imaging studies  CBC: Recent Labs  Lab 04/05/21 0500 04/06/21 0533 04/07/21 0630 04/08/21 1000 04/09/21 0514  WBC 3.5* 2.9* 3.4* 2.8* 3.3*  NEUTROABS 2.0 1.4* 1.7 1.3* 1.7  HGB  7.5* 8.0* 7.8* 7.8* 7.3*  HCT 22.9* 24.4* 23.3* 23.5* 22.7*  MCV 85.1 83.8 83.8 83.9 85.0  PLT 71* 73* 67* 72* 72*    Basic Metabolic Panel: Recent Labs  Lab 04/04/21 0500 04/05/21 0500 04/06/21 0533 04/07/21 0630 04/08/21 0523 04/09/21 0514  NA 139 138 133* 132* 135 137  K 3.5 3.1* 3.5 3.2* 3.0* 3.3*  CL 103 102 97* 97* 103 104  CO2 29 30 28 26 26 27   GLUCOSE 125* 137* 141* 119* 120* 101*  BUN 10 6* 5* 7* 5* <5*  CREATININE 0.49 0.39* 0.35* <0.30* 0.33* 0.39*  CALCIUM 8.5* 8.5* 8.6* 8.9 8.0* 8.2*  MG 1.6*  --  1.1* 1.1* 1.5* 1.6*  PHOS 1.7* 2.0* 2.8 2.3* 2.8 2.2*    GFR: CrCl cannot be calculated (Unknown ideal weight.). Liver Function Tests: Recent Labs  Lab  04/02/21 1630 04/03/21 0647 04/04/21 0500 04/05/21 0500  AST 18 15 28 19   ALT 37 29 32 26  ALKPHOS 38 38 42 40  BILITOT 0.7 0.7 0.5 0.6  PROT 5.9* 5.3* 5.6* 5.3*  ALBUMIN 2.4* 2.2* 2.2* 2.2*    No results for input(s): LIPASE, AMYLASE in the last 168 hours. No results for input(s): AMMONIA in the last 168 hours. Coagulation Profile: No results for input(s): INR, PROTIME in the last 168 hours. Cardiac Enzymes: No results for input(s): CKTOTAL, CKMB, CKMBINDEX, TROPONINI in the last 168 hours. BNP (last 3 results) No results for input(s): PROBNP in the last 8760 hours. HbA1C: No results for input(s): HGBA1C in the last 72 hours. CBG: Recent Labs  Lab 04/06/21 0757 04/06/21 1223 04/06/21 1804 04/09/21 0741 04/09/21 1205  GLUCAP 140* 148* 125* 103* 109*    Lipid Profile: No results for input(s): CHOL, HDL, LDLCALC, TRIG, CHOLHDL, LDLDIRECT in the last 72 hours. Thyroid Function Tests: No results for input(s): TSH, T4TOTAL, FREET4, T3FREE, THYROIDAB in the last 72 hours. Anemia Panel: No results for input(s): VITAMINB12, FOLATE, FERRITIN, TIBC, IRON, RETICCTPCT in the last 72 hours. Sepsis Labs: No results for input(s): PROCALCITON, LATICACIDVEN in the last 168 hours.  Recent Results (from the past 240 hour(s))  Culture, Urine     Status: Abnormal   Collection Time: 04/01/21  8:56 AM   Specimen: Urine, Random  Result Value Ref Range Status   Specimen Description   Final    URINE, RANDOM Performed at [facility] [address], [place], [place] [address]    Special Requests   Final    NONE Performed at [facility], [place] [address] [place], [place] [address]    Culture >=100,000 COLONIES/mL PSEUDOMONAS AERUGINOSA (A)  Final   Report Status 04/03/2021 FINAL  Final   Organism ID, Bacteria PSEUDOMONAS AERUGINOSA (A)  Final      Susceptibility   Pseudomonas aeruginosa - MIC*    CEFTAZIDIME 2 SENSITIVE Sensitive      CIPROFLOXACIN <=0.25 SENSITIVE Sensitive     GENTAMICIN 2 SENSITIVE Sensitive     IMIPENEM 2 SENSITIVE Sensitive     PIP/TAZO 8 SENSITIVE Sensitive     CEFEPIME 2 SENSITIVE Sensitive     * >=100,000 COLONIES/mL PSEUDOMONAS AERUGINOSA  Culture, blood (routine x 2)     Status: None   Collection Time: 04/01/21  9:16 AM   Specimen: BLOOD  Result Value Ref Range Status   Specimen Description   Final    BLOOD BLOOD RIGHT HAND Performed at [facility] [address], [place],  [address]  Special Requests   Final    BOTTLES DRAWN AEROBIC AND ANAEROBIC Blood Culture adequate volume Performed at [facility] [address], [place], [place] [address]    Culture   Final    NO GROWTH 5 DAYS Performed at [facility], [place] [address], [place], [place] [address]    Report Status 04/06/2021 FINAL  Final  Culture, blood (routine x 2)     Status: None   Collection Time: 04/01/21 10:38 AM   Specimen: BLOOD  Result Value Ref Range Status   Specimen Description   Final    BLOOD LEFT HAND Performed at [facility] [address], [place], [place] [address]    Special Requests   Final    BOTTLES DRAWN AEROBIC ONLY Blood Culture results may not be optimal due to an inadequate volume of blood received in culture bottles Performed at [facility] [address], [place], [place] [address]    Culture   Final    NO GROWTH 5 DAYS Performed at [facility], [place] [address] [place], [place] [address]    Report Status 04/06/2021 FINAL  Final         Radiology Studies: No results found.      Scheduled Meds:  carvedilol  12.5 mg Oral BID WC   chlorhexidine  15 mL Mouth/Throat QID   Chlorhexidine Gluconate Cloth  6 each Topical Daily   enoxaparin (LOVENOX) injection  40 mg Subcutaneous Q24H   feeding supplement  237 mL Oral TID BM   FLUoxetine  20 mg Oral Daily   gabapentin  200 mg Oral BID    magic mouthwash w/lidocaine  2 mL Oral TID   magnesium oxide  800 mg Oral Daily   mouth rinse  15 mL Mouth Rinse q12n4p   melatonin  10 mg Oral QHS   mirtazapine  15 mg Oral QHS   multivitamin with minerals  1 tablet Oral Daily   pantoprazole sodium  40 mg Oral BID   polyethylene glycol  17 g Oral Daily   protein supplement  1 Scoop Oral TID WC   sucralfate  1 g Oral TID WC & HS   Continuous Infusions:     LOS: 13 days    Georgette Shell, MD 04/09/2021, 4:03 PM

## 2021-04-10 LAB — TYPE AND SCREEN
ABO/RH(D): A POS
Antibody Screen: NEGATIVE
Unit division: 0

## 2021-04-10 LAB — CBC WITH DIFFERENTIAL/PLATELET
Abs Immature Granulocytes: 0.03 10*3/uL (ref 0.00–0.07)
Basophils Absolute: 0 10*3/uL (ref 0.0–0.1)
Basophils Relative: 0 %
Eosinophils Absolute: 0 10*3/uL (ref 0.0–0.5)
Eosinophils Relative: 0 %
HCT: 26.2 % — ABNORMAL LOW (ref 36.0–46.0)
Hemoglobin: 8.7 g/dL — ABNORMAL LOW (ref 12.0–15.0)
Immature Granulocytes: 1 %
Lymphocytes Relative: 31 %
Lymphs Abs: 1.1 10*3/uL (ref 0.7–4.0)
MCH: 28.7 pg (ref 26.0–34.0)
MCHC: 33.2 g/dL (ref 30.0–36.0)
MCV: 86.5 fL (ref 80.0–100.0)
Monocytes Absolute: 0.4 10*3/uL (ref 0.1–1.0)
Monocytes Relative: 12 %
Neutro Abs: 1.9 10*3/uL (ref 1.7–7.7)
Neutrophils Relative %: 56 %
Platelets: 87 10*3/uL — ABNORMAL LOW (ref 150–400)
RBC: 3.03 MIL/uL — ABNORMAL LOW (ref 3.87–5.11)
RDW: 16.7 % — ABNORMAL HIGH (ref 11.5–15.5)
WBC: 3.5 10*3/uL — ABNORMAL LOW (ref 4.0–10.5)
nRBC: 0 % (ref 0.0–0.2)

## 2021-04-10 LAB — BPAM RBC
Blood Product Expiration Date: 202207072359
ISSUE DATE / TIME: 202206132051
Unit Type and Rh: 6200

## 2021-04-10 LAB — GLUCOSE, CAPILLARY
Glucose-Capillary: 93 mg/dL (ref 70–99)
Glucose-Capillary: 102 mg/dL — ABNORMAL HIGH (ref 70–99)
Glucose-Capillary: 93 mg/dL (ref 70–99)

## 2021-04-10 LAB — CREATININE, SERUM
Creatinine, Ser: 0.35 mg/dL — ABNORMAL LOW (ref 0.44–1.00)
GFR, Estimated: >60 mL/min (ref >60)

## 2021-04-10 MED ORDER — SUCRALFATE 1 G PO TABS: 1.0000 g | ORAL_TABLET | Freq: Three times a day (TID) | ORAL | 1 refills | Status: DC

## 2021-04-10 MED ORDER — POTASSIUM CHLORIDE CRYS ER 20 MEQ PO TBCR: 40.0000 meq | EXTENDED_RELEASE_TABLET | Freq: Every day | ORAL | 0 refills | Status: DC

## 2021-04-10 MED ORDER — NALOXONE HCL 0.4 MG/ML IJ SOLN: 0.4000 mg | INTRAMUSCULAR | Status: DC | PRN

## 2021-04-10 MED ORDER — GABAPENTIN 100 MG PO CAPS: 200.0000 mg | ORAL_CAPSULE | Freq: Two times a day (BID) | ORAL | 0 refills | Status: DC

## 2021-04-10 MED ORDER — ADULT MULTIVITAMIN W/MINERALS CH: 1.0000 | ORAL_TABLET | Freq: Every day | ORAL | Status: DC

## 2021-04-10 MED ORDER — COVID-19 MRNA VACC (MODERNA) 50 MCG/0.25ML IM SUSP
0.2500 mL | Freq: Once | INTRAMUSCULAR | Status: AC
  Administered 2021-04-10: 0.25 mL via INTRAMUSCULAR
  Filled 2021-04-10: qty 0.25

## 2021-04-10 MED ORDER — TRAMADOL HCL 50 MG PO TABS: 50.0000 mg | ORAL_TABLET | Freq: Four times a day (QID) | ORAL | 0 refills | Status: DC | PRN

## 2021-04-10 MED ORDER — GERHARDT'S BUTT CREAM
TOPICAL_CREAM | Freq: Two times a day (BID) | CUTANEOUS | Status: DC
  Administered 2021-04-11 – 2021-04-26 (×4): 1 via TOPICAL
  Filled 2021-04-10: qty 1

## 2021-04-10 MED ORDER — MAGNESIUM OXIDE -MG SUPPLEMENT 400 (240 MG) MG PO TABS: 800.0000 mg | ORAL_TABLET | Freq: Every day | ORAL | Status: DC

## 2021-04-10 MED ORDER — GERHARDT'S BUTT CREAM
1.0000 | TOPICAL_CREAM | Freq: Three times a day (TID) | CUTANEOUS | 1 refills | Status: DC
Start: 2021-04-10 — End: 2023-02-11

## 2021-04-10 MED ORDER — JUVEN PO PACK
1.0000 | PACK | Freq: Two times a day (BID) | ORAL | Status: DC
  Administered 2021-04-10 – 2021-04-28 (×20): 1 via ORAL
  Filled 2021-04-10 (×37): qty 1

## 2021-04-10 NOTE — Progress Notes (Signed)
Patient is lethargic this am and more confused, vitals stable. MD was notified. Patient may just be lethargic because she took all her night medications. Other nurses have told me she usually does not take her meds.  Will pass this along to day RN and have her monitor patient closely. Roderick Pee  e

## 2021-04-10 NOTE — Progress Notes (Signed)
Physical Therapy Treatment Patient Details Name: Yvette Roberts MRN: [id number] DOB: [birth year] Today's Date: 04/10/2021    History of Present Illness Yvette Roberts is a 65 year old female presents with severe odynophagia the resultant minimal intake and profound weight loss. EGD on 03/29/21 which showed mild nonsevere esophagitis. PMH: hypertension, COPD, diabetes, stage IV uterine cancer, history of malignant pleural effusion diagnosed in 2/22, currently on chemotherapy    PT Comments    Pt is from home alone and was walking with a 4WW.  Currently pt is present with B LE paraparesis requiring + 2 assist to roll side to side and unable to even hold a bent knee position.  She is unable to support herself in sitting EOB.  Assisted with transfer to [facility] required + 2 Total Assist "Bear Hug" block knees stand pivot sit.  Profound weakness. Rec nursing use Maxi Move Lift for any OOB activity.     Follow Up Recommendations  SNF     Equipment Recommendations  None recommended by PT    Recommendations for Other Services       Precautions / Restrictions Precautions Precautions: Fall Precaution Comments: B LE paresis (profound weakness)    Mobility  Bed Mobility Overal bed mobility: Needs Assistance Bed Mobility: Rolling;Supine to Sit;Sit to Supine Rolling: Max assist;+2 for physical assistance   Supine to sit: +2 for safety/equipment;+2 for physical assistance;Total assist Sit to supine: +2 for safety/equipment;+2 for physical assistance;Total assist   General bed mobility comments: VC to reach for bedrail, max A +2 to roll.  Pt unable to maintain flexed knees and unable to move either LE to self assist.    Transfers   Equipment used: None             General transfer comment: "Bear Hug" 1/4 pivot from elevated bed to [facility] then from [facility] back to bed.  Profound weakness.  Pt unable to support her own body weight. B knees buckle and present with paraperesis.  Rec nursing use a MAXI  MOVE LIFT.  Ambulation/Gait             General Gait Details: not able   Stairs             Wheelchair Mobility    Modified Rankin (Stroke Patients Only)       Balance                                            Cognition Arousal/Alertness: Awake/alert Behavior During Therapy: WFL for tasks assessed/performed Overall Cognitive Status: No family/caregiver present to determine baseline cognitive functioning                                 General Comments: AxO x 2 requiring MAX encouragement to participate and has poor insight to her current medical condition      Exercises      General Comments        Pertinent Vitals/Pain Pain Assessment: Faces Faces Pain Scale: Hurts a little bit Pain Location: buttocks (lPeri care oose BM) Pain Descriptors / Indicators: Burning;Tender Pain Intervention(s): Monitored during session    Home Living                      Prior Function  PT Goals (current goals can now be found in the care plan section) Progress towards PT goals: Progressing toward goals    Frequency    Min 2X/week      PT Plan Current plan remains appropriate    Co-evaluation              AM-PAC PT "6 Clicks" Mobility   Outcome Measure  Help needed turning from your back to your side while in a flat bed without using bedrails?: Total Help needed moving from lying on your back to sitting on the side of a flat bed without using bedrails?: Total Help needed moving to and from a bed to a chair (including a wheelchair)?: Total Help needed standing up from a chair using your arms (e.g., wheelchair or bedside chair)?: Total Help needed to walk in hospital room?: Total Help needed climbing 3-5 steps with a railing? : Total 6 Click Score: 6    End of Session Equipment Utilized During Treatment: Gait belt Activity Tolerance: Treatment limited secondary to medical complications (Comment)  (profound weakness)   Nurse Communication: Mobility status PT Visit Diagnosis: Other abnormalities of gait and mobility (R26.89);Muscle weakness (generalized) (M62.81)     Time: 2482-5003 PT Time Calculation (min) (ACUTE ONLY): 30 min  Charges:  $Therapeutic Activity: 23-37 mins                     {Georgenia Salim  PTA Acute  Rehabilitation Services Pager      [phone] Office      [phone]

## 2021-04-10 NOTE — Progress Notes (Addendum)
PROGRESS NOTE    Yvette Roberts  VHQ:[id number] DOB: 10/22/1956 DOA: 03/27/2021 PCP: [name], FNP   Brief Narrative:65 year old African-American female with a past medical history metastatic uterine cancer, type 2 diabetes, hypertension malignant pleural effusion admitted with odynophagia and weight loss.  She reports losing 50 to 60 pounds in the last 3 months.  She underwent further work-up and had an EGD and found nonsevere esophagitis in the proximal and distal esophagus as well as scattered mild inflammation with erythema in the entire stomach.     Assessment & Plan:   Principal Problem:   Esophagitis Active Problems:   Type 2 diabetes mellitus without complication (HCC)   Hypertension   Uterine cancer (HCC)   Malignant pleural effusion   Protein-calorie malnutrition, severe (HCC)   Odynophagia   Decreased urine output   Odynophagia and Esophagitis, resolved GERD -GI was consulted and underwent EGD on 03/29/21 which showed mild nonsevere esophagitis in the proximal and distal esophagus as well as scattered mild inflammation with erythema in the entire stomach but she has profound symptoms of Esophagitis  -Her Bx -biopsy from the stomach shows reactive gastropathy and negative GI H. pylori.  No metaplasia dysplasia or malignancy.  Biopsy from the esophagus shows mild acute esophagitis negative for fungus interstitial metaplasia or dysplasia or malignancy. Stopped IV Dilaudid and increased oxycodone to 10 mg every 6 as needed  -GI recommending continuing Pantoprazole BID and Carafate  1 gram po TIDwm and qHS   -Patient to follow up with GI in the outpatient setting    Febrile Neutropenia-no further fevers she finished a course of cefepime for 7 days.  Cefepime was stopped on 04/07/2021.  - Pressure Injury 04/05/21 Sacrum Posterior;Medial;Lower Stage 2 -  Partial thickness loss of dermis presenting as a shallow open injury with a red, pink wound bed without slough.  Appears to be stage two, bleeding right inside of buttocks and upper part of (Active)  04/05/21 2030  Location: Sacrum  Location Orientation: Posterior;Medial;Lower  Staging: Stage 2 -  Partial thickness loss of dermis presenting as a shallow open injury with a red, pink wound bed without slough.  Wound Description (Comments): Appears to be stage two, bleeding right inside of buttocks and upper part of bottom  Present on Admission: No    Pancytopenia-hemoglobin 8.7 post 1 unit of blood transfusion 04/09/2021.     Pseudomonas Aeruginosa UTI finished a course of cefepime.  Hypokalemia potassium 3.0 replete   Hypomagnesemia 1.5 Replete  Hypophosphatemia-phosphorus level is 2.5 repleted  Refeeding syndrome monitor electrolytes daily and replace  Severe Protein Calorie Malnutrition -Nutritionist was consulted -she has just started to eat will need to monitor for refeeding syndrome.    Metastatic Uterine Cancer-followed by oncology holding off on further chemo at this time.  Oncology signed off.   Malignant Pleural Effusion -CT Scan showed "Loculated RIGHT-sided pleural fluid with 6.5 x 3.6 cm axial dimension as compared to 8.1 x 3.9 cm. Basilar volume loss and signs of pleural thickening with decreased pleural thickening, markedly diminished since the prior PET and with continued decreased pleural thickening particularly in the inferior RIGHT chest even since the recent CT of the chest. Over the RIGHT hemidiaphragm pleural thickness approximately 7 mm as compared to 12 mm greatest thickness. LEFT chest is clear. Airways are patent." with the impression being "Decreased loculated pleural fluid and pleural thickening in the chest follows diminishing perihepatic soft tissue and capsular hepatic implants as described" -No Respiratory Distress  Hypertension stable at 120/79 on carvedilol 12.5 twice daily.   Diabetes Mellitus Type 2-CBG (last 3)  Recent Labs    04/09/21 1543 04/10/21 0726  04/10/21 1233  GLUCAP 100* 93 102*     -Last hemoglobin A1c was 6.1 -Continue SSI  Anxiety and Depression -C/w Mirtazapine 50 mg p.o. nightly and Fluoxetine 20 mg p.o. daily  -Stage I decubitus this was unavoidable due to severe protein calorie malnutrition/esophageal/dysphagia and odynophagia  Disposition-await SNF no bed available today.  COVID negative. She received her primary COVID vaccines and COVID booster is ordered today.     Nutrition Problem: Inadequate oral intake Etiology: cancer and cancer related treatments, decreased appetite (odynophagia)  Signs/Symptoms: per patient/family report  Interventions: Ensure Enlive (each supplement provides 350kcal and 20 grams of protein), Refer to RD note for recommendations  Estimated body mass index is 27.37 kg/m as calculated from the following:   Height as of this encounter: 5\' 6"  (1.676 m).   Weight as of 03/22/21: 76.9 kg.  DVT prophylaxis: Enoxaparin  Code Status: FULL CODE Family Communication: Discussed with her sister-in-law over the phone Disposition Plan: Pending further improvement and tolerance of diet and clearance by medical oncology Need to monitor for refeeding sybdrome   Status is: Inpatient   Remains inpatient appropriate because:Unsafe d/c plan, IV treatments appropriate due to intensity of illness or inability to take PO and Inpatient level of care appropriate due to severity of illness     Dispo: The patient is from: Home              Anticipated d/c is to: snf              Patient currently is not medically stable to d/c.              Difficult to place patient No   Consultants:  Medical Oncology  Gastroenterology    Subjective:   Patient is resting in bed in no acute distress she denies any nausea vomiting dysphagia odynophagia diarrhea or abdominal pain or urinary complaints.  No difficulty breathing. Objective: Vitals:   04/10/21 0000 04/10/21 0015 04/10/21 0400 04/10/21 0604  BP: 138/80    136/83  Pulse: 90   86  Resp:  20  16  Temp: 99 F (37.2 C)   98 F (36.7 C)  TempSrc: Oral   Oral  SpO2: 99%   100%  Height:   5\' 6"  (1.676 m)     Intake/Output Summary (Last 24 hours) at 04/10/2021 1324 Last data filed at 04/10/2021 0936 Gross per 24 hour  Intake 1073 ml  Output 802 ml  Net 271 ml    There were no vitals filed for this visit.  Examination:  General exam: Appears calm and comfortable, port in right upper chest Respiratory system: Clear to auscultation. Respiratory effort normal. Cardiovascular system: S1 & S2 heard, RRR. No JVD, murmurs, rubs, gallops or clicks. No pedal edema. Gastrointestinal system: Abdomen is distended, soft and nontender. No organomegaly or masses felt. Normal bowel sounds heard. Central nervous system: Alert and oriented. No focal neurological deficits. Extremities: Trace bilateral pitting edema Skin: No rashes, lesions or ulcers Psychiatry: Judgement and insight appear normal. Mood & affect appropriate.     Data Reviewed: I have personally reviewed following labs and imaging studies  CBC: Recent Labs  Lab 04/06/21 0533 04/07/21 0630 04/08/21 1000 04/09/21 0514 04/10/21 0602  WBC 2.9* 3.4* 2.8* 3.3* 3.5*  NEUTROABS 1.4* 1.7 1.3* 1.7 1.9  HGB 8.0* 7.8*  7.8* 7.3* 8.7*  HCT 24.4* 23.3* 23.5* 22.7* 26.2*  MCV 83.8 83.8 83.9 85.0 86.5  PLT 73* 67* 72* 72* 87*    Basic Metabolic Panel: Recent Labs  Lab 04/04/21 0500 04/05/21 0500 04/06/21 0533 04/07/21 0630 04/08/21 0523 04/09/21 0514 04/10/21 0602  NA 139 138 133* 132* 135 137  --   K 3.5 3.1* 3.5 3.2* 3.0* 3.3*  --   CL 103 102 97* 97* 103 104  --   CO2 29 30 28 26 26 27   --   GLUCOSE 125* 137* 141* 119* 120* 101*  --   BUN 10 6* 5* 7* 5* <5*  --   CREATININE 0.49 0.39* 0.35* <0.30* 0.33* 0.39* 0.35*  CALCIUM 8.5* 8.5* 8.6* 8.9 8.0* 8.2*  --   MG 1.6*  --  1.1* 1.1* 1.5* 1.6*  --   PHOS 1.7* 2.0* 2.8 2.3* 2.8 2.2*  --     GFR: CrCl cannot be calculated  (Unknown ideal weight.). Liver Function Tests: Recent Labs  Lab 04/04/21 0500 04/05/21 0500  AST 28 19  ALT 32 26  ALKPHOS 42 40  BILITOT 0.5 0.6  PROT 5.6* 5.3*  ALBUMIN 2.2* 2.2*    No results for input(s): LIPASE, AMYLASE in the last 168 hours. No results for input(s): AMMONIA in the last 168 hours. Coagulation Profile: No results for input(s): INR, PROTIME in the last 168 hours. Cardiac Enzymes: No results for input(s): CKTOTAL, CKMB, CKMBINDEX, TROPONINI in the last 168 hours. BNP (last 3 results) No results for input(s): PROBNP in the last 8760 hours. HbA1C: No results for input(s): HGBA1C in the last 72 hours. CBG: Recent Labs  Lab 04/09/21 0741 04/09/21 1205 04/09/21 1543 04/10/21 0726 04/10/21 1233  GLUCAP 103* 109* 100* 93 102*    Lipid Profile: No results for input(s): CHOL, HDL, LDLCALC, TRIG, CHOLHDL, LDLDIRECT in the last 72 hours. Thyroid Function Tests: No results for input(s): TSH, T4TOTAL, FREET4, T3FREE, THYROIDAB in the last 72 hours. Anemia Panel: No results for input(s): VITAMINB12, FOLATE, FERRITIN, TIBC, IRON, RETICCTPCT in the last 72 hours. Sepsis Labs: No results for input(s): PROCALCITON, LATICACIDVEN in the last 168 hours.  Recent Results (from the past 240 hour(s))  Culture, Urine     Status: Abnormal   Collection Time: 04/01/21  8:56 AM   Specimen: Urine, Random  Result Value Ref Range Status   Specimen Description   Final    URINE, RANDOM Performed at [facility] [address] [place], [place] [address]    Special Requests   Final    NONE Performed at [facility], [place] [address] [place], [place] [address]    Culture >=100,000 COLONIES/mL PSEUDOMONAS AERUGINOSA (A)  Final   Report Status 04/03/2021 FINAL  Final   Organism ID, Bacteria PSEUDOMONAS AERUGINOSA (A)  Final      Susceptibility   Pseudomonas aeruginosa - MIC*    CEFTAZIDIME 2 SENSITIVE Sensitive     CIPROFLOXACIN <=0.25  SENSITIVE Sensitive     GENTAMICIN 2 SENSITIVE Sensitive     IMIPENEM 2 SENSITIVE Sensitive     PIP/TAZO 8 SENSITIVE Sensitive     CEFEPIME 2 SENSITIVE Sensitive     * >=100,000 COLONIES/mL PSEUDOMONAS AERUGINOSA  Culture, blood (routine x 2)     Status: None   Collection Time: 04/01/21  9:16 AM   Specimen: BLOOD  Result Value Ref Range Status   Specimen Description   Final    BLOOD BLOOD RIGHT HAND Performed  at [facility], [place] [address] [place] [address], [place], [place] [address]    Special Requests   Final    BOTTLES DRAWN AEROBIC AND ANAEROBIC Blood Culture adequate volume Performed at [facility] [address] [place], [place] [address]    Culture   Final    NO GROWTH 5 DAYS Performed at [facility], [place] [address], [place], [place] [address]    Report Status 04/06/2021 FINAL  Final  Culture, blood (routine x 2)     Status: None   Collection Time: 04/01/21 10:38 AM   Specimen: BLOOD  Result Value Ref Range Status   Specimen Description   Final    BLOOD LEFT HAND Performed at [facility] [address] [place], [place] [address]    Special Requests   Final    BOTTLES DRAWN AEROBIC ONLY Blood Culture results may not be optimal due to an inadequate volume of blood received in culture bottles Performed at [facility] [address], [place], [place] [address]    Culture   Final    NO GROWTH 5 DAYS Performed at [facility], [place] [address], [place], [place] [address]    Report Status 04/06/2021 FINAL  Final  SARS CORONAVIRUS 2 (TAT 6-24 HRS) Nasopharyngeal Nasopharyngeal Swab     Status: None   Collection Time: 04/09/21  2:12 PM   Specimen: Nasopharyngeal Swab  Result Value Ref Range Status   SARS Coronavirus 2 NEGATIVE NEGATIVE Final    Comment: (NOTE) SARS-CoV-2 target nucleic acids are NOT DETECTED.  The SARS-CoV-2 RNA is generally detectable in upper and lower respiratory specimens  during the acute phase of infection. Negative results do not preclude SARS-CoV-2 infection, do not rule out co-infections with other pathogens, and should not be used as the sole basis for treatment or other patient management decisions. Negative results must be combined with clinical observations, patient history, and epidemiological information. The expected result is Negative.  Fact Sheet for Patients: SugarRoll.be  Fact Sheet for Healthcare Providers: https://www.[employer].com/  This test is not yet approved or cleared by the [place] FDA and  has been authorized for detection and/or diagnosis of SARS-CoV-2 by FDA under an Emergency Use Authorization (EUA). This EUA will remain  in effect (meaning this test can be used) for the duration of the COVID-19 declaration under Se ction 564(b)(1) of the Act, 21 U.S.C. section 360bbb-3(b)(1), unless the authorization is terminated or revoked sooner.  Performed at [facility], [place] [address], [place],  [address]          Radiology Studies: No results found.      Scheduled Meds:  carvedilol  12.5 mg Oral BID WC   chlorhexidine  15 mL Mouth/Throat QID   Chlorhexidine Gluconate Cloth  6 each Topical Daily   enoxaparin (LOVENOX) injection  40 mg Subcutaneous Q24H   feeding supplement  237 mL Oral TID BM   FLUoxetine  20 mg Oral Daily   gabapentin  200 mg Oral BID   Gerhardt's butt cream   Topical BID   magic mouthwash w/lidocaine  2 mL Oral TID   magnesium oxide  800 mg Oral Daily   mouth rinse  15 mL Mouth Rinse q12n4p   melatonin  10 mg Oral QHS   mirtazapine  15 mg Oral QHS   multivitamin with minerals  1 tablet Oral Daily   nutrition supplement (JUVEN)  1 packet Oral BID WC  pantoprazole sodium  40 mg Oral BID   polyethylene glycol  17 g Oral Daily   potassium chloride  40 mEq Oral Daily   protein supplement  1 Scoop Oral TID WC   sucralfate  1 g Oral  TID WC & HS   Continuous Infusions:     LOS: 14 days    Georgette Shell, MD 04/10/2021, 1:24 PM

## 2021-04-10 NOTE — Progress Notes (Signed)
Per MD Zigmund Daniel ok to leave iv out and port deaccessed for the night.

## 2021-04-10 NOTE — Progress Notes (Signed)
Nutrition Follow-up  INTERVENTION:   -Ensure Enlive po TID, each supplement provides 350 kcal and 20 grams of protein   -Juven Fruit Punch BID, each serving provides 95kcal and 2.5g of protein (amino acids glutamine and arginine)   -Beneprotein TID with meals, each provides 25 kcals and 6g protein  NUTRITION DIAGNOSIS:   Inadequate oral intake related to cancer and cancer related treatments, decreased appetite (odynophagia) as evidenced by per patient/family report.  Ongoing.  GOAL:   Patient will meet greater than or equal to 90% of their needs  Progressing.  MONITOR:   PO intake, Supplement acceptance, Labs, Weight trends, I & O's  ASSESSMENT:   65 year old African-American female with a past medical history significant for but not limited to hypertension, type 2 diabetes mellitus, stage IV uterine cancer, history malignant pleural effusion diagnosed in February 2022 followed by Dr. Simeon Craft such currently on chemotherapy who had multiple symptoms most notably severe odynophagia the resultant minimal intake and profound weight loss.  Patient is now consuming 0-50% of meals. Drinking some Ensures and accepting Beneprotein at least once daily.  Will order Juven now that pt with stage 2 sacral pressure injury.  Per MD note, plan is for discharge to SNF.  No weight obtained for admission.  Medications: Magic mouthwash, MAG-OX, Remeron, Multivitamin with minerals daily, Miralax, KLOR-CON, Carafate  Labs reviewed:  CBGs: 93-100  Diet Order:   Diet Order             Diet - low sodium heart healthy           DIET SOFT Room service appropriate? Yes; Fluid consistency: Thin  Diet effective now                   EDUCATION NEEDS:   No education needs have been identified at this time  Skin:  Skin Assessment: Reviewed RN Assessment  Last BM:  6/4  Height:   Ht Readings from Last 1 Encounters:  04/10/21 5\' 6"  (1.676 m)    Weight:   Wt Readings from Last 1  Encounters:  03/22/21 76.9 kg    BMI:  Body mass index is 27.37 kg/m.  Estimated Nutritional Needs:   Kcal:  2100-2300  Protein:  105-115g  Fluid:  2.1L/day   Clayton Bibles, MS, RD, LDN Inpatient Clinical Dietitian Contact information available via Amion

## 2021-04-10 NOTE — TOC Progression Note (Addendum)
Transition of Care [facility]) - Progression Note    Patient Details  Name: Yvette Roberts MRN: [id number] Date of Birth: [birth year]  [facility] CM/SW Contact  Leeroy Cha, RN Phone Number: 04/10/2021, 8:25 AM  Clinical Narrative:    0825 bed offers pending Patient can go to [facility] on [id number] needs to have covid booster.  Dr. Rodena Piety made aware.  Expected Discharge Plan: Skilled Nursing Facility Barriers to Discharge: Continued Medical Work up  Expected Discharge Plan and Services Expected Discharge Plan: [place]   Discharge Planning Services: CM Consult   Living arrangements for the past 2 months: Apartment                                       Social Determinants of Health (SDOH) Interventions    Readmission Risk Interventions Readmission Risk Prevention Plan 04/05/2021  Transportation Screening Complete  Medication Review Press photographer) Complete  PCP or Specialist appointment within 3-5 days of discharge Complete  HRI or Home Care Consult Complete  SW Recovery Care/Counseling Consult Complete  Palliative Care Screening Not [place] Complete  Some recent data might be hidden

## 2021-04-10 NOTE — Progress Notes (Addendum)
Patient has a pretty painful looking stage 2 (although it looks more like a stage 3 now) to her sacral area. There is a large, bleeding middle portion (about a 6x4cm area) that is also connected to several other irritated and opened areas. It is about 30 percent yellow and 70 percent red.  Patient also has a wound to the right buttock (about 4x2 cm in size). This area was bleeding upon assessment. Last, there is a smaller pink open area to her upper right buttock (about 1x1cm in size). This was not bleeding.  Patient's wounds were cleaned/ dried and a new foam pad was placed. Upon changing purewick, I noticed that patient's perineal area was swollen,ulcerated, and had a red raised area. Patient c/o discomfort in this area. Purewick was immediately discontinued. Patient was gently cleansed/dried.  Patient encouraged to call for bedpan. Notified MD regarding sores. WOC consult ordered.  Patient educated regarding need to take of brief and not wear purewick.  She was also reminded to turn side to side as much as possible. I will pass on to oncoming nurse that patient must be turned q2h. Will continue to monitor.Roderick Pee

## 2021-04-10 NOTE — Consult Note (Signed)
WOC Nurse Consult Note: Reason for Consult: perineal wounds Wound type:  Noted in chart patient has pressure injuries as well Stage 2 Pressure injury; sacrum/gluteal cleft 8cm x 5cm that extends over bilateral buttocks Stage 2 Pressure injury: right buttock Stage 2 Pressure injury: left buttock  Pressure Injury POA: No Measurement: largest area in the gluteal cleft and out onto the buttocks see above Other areas aprox. 2cm 2cm x 0.1cm  Wound bed:90% pink, clean/ 10% fibrinous  Drainage (amount, consistency, odor) minimal, no odor Periwound: intact  Patient has a growth on the clitoris that is purple/pink, moist, bulbous;  would refer to gynecology for this  Patient reports itching at the buttock wounds  Dressing procedure/placement/frequency: Add Gerheardts buttcream BID  Continue silicone foam to protect and insulate Patient can turn from side to side with minimal assistance.  Discussed POC with patient and bedside nurse.  Re consult if needed, will not follow at this time. Thanks  Abbie Jablon [employer], RN,CWOCN, CNS, [place] [phone])

## 2021-04-11 ENCOUNTER — Inpatient Hospital Stay (HOSPITAL_COMMUNITY): Payer: Medicare Other

## 2021-04-11 LAB — CBC WITH DIFFERENTIAL/PLATELET
Abs Immature Granulocytes: 0.04 10*3/uL (ref 0.00–0.07)
Basophils Absolute: 0 10*3/uL (ref 0.0–0.1)
Basophils Relative: 0 %
Eosinophils Absolute: 0 10*3/uL (ref 0.0–0.5)
Eosinophils Relative: 0 %
HCT: 27.5 % — ABNORMAL LOW (ref 36.0–46.0)
Hemoglobin: 9.1 g/dL — ABNORMAL LOW (ref 12.0–15.0)
Immature Granulocytes: 1 %
Lymphocytes Relative: 31 %
Lymphs Abs: 1.3 10*3/uL (ref 0.7–4.0)
MCH: 28.7 pg (ref 26.0–34.0)
MCHC: 33.1 g/dL (ref 30.0–36.0)
MCV: 86.8 fL (ref 80.0–100.0)
Monocytes Absolute: 0.4 10*3/uL (ref 0.1–1.0)
Monocytes Relative: 10 %
Neutro Abs: 2.4 10*3/uL (ref 1.7–7.7)
Neutrophils Relative %: 58 %
Platelets: 116 10*3/uL — ABNORMAL LOW (ref 150–400)
RBC: 3.17 MIL/uL — ABNORMAL LOW (ref 3.87–5.11)
RDW: 17.2 % — ABNORMAL HIGH (ref 11.5–15.5)
WBC: 4.1 10*3/uL (ref 4.0–10.5)
nRBC: 0.5 % — ABNORMAL HIGH (ref 0.0–0.2)

## 2021-04-11 LAB — GLUCOSE, CAPILLARY
Glucose-Capillary: 84 mg/dL (ref 70–99)
Glucose-Capillary: 103 mg/dL — ABNORMAL HIGH (ref 70–99)

## 2021-04-11 LAB — PHOSPHORUS: Phosphorus: 3.1 mg/dL (ref 2.5–4.6)

## 2021-04-11 MED ORDER — GADOBUTROL 1 MMOL/ML IV SOLN
7.0000 mL | Freq: Once | INTRAVENOUS | Status: AC | PRN
  Administered 2021-04-11: 7 mL via INTRAVENOUS

## 2021-04-11 MED ORDER — OXYCODONE HCL 10 MG PO TABS: 10.0000 mg | ORAL_TABLET | Freq: Four times a day (QID) | ORAL | 0 refills | Status: DC | PRN

## 2021-04-11 MED ORDER — FLUOXETINE HCL 20 MG/5ML PO SOLN: 20.0000 mg | Freq: Every day | ORAL | 0 refills | Status: AC

## 2021-04-11 MED ORDER — PANTOPRAZOLE SODIUM 40 MG PO PACK: 40.0000 mg | PACK | Freq: Two times a day (BID) | ORAL | 1 refills | Status: AC

## 2021-04-11 NOTE — Discharge Summary (Signed)
Physician Discharge Summary  SHALAY CARDER CBJ:[id number] DOB: Oct 12, 1956 DOA: 03/27/2021  PCP: [name], FNP  Admit date: 03/27/2021 Discharge date: 04/11/2021  Time spent: 40 minutes  Recommendations for Outpatient Follow-up:  Follow outpatient CBC/CMP/mg/phos Follow with gastroenterology outpatient Follow with oncology outpatient Follow decubitus ulcer outpatient - continue wound care Follow pain outpatient and adjust regimen as needed Follow malignant effusion outpatient  Follow blood pressure outpatient  Follow imaging findings outpatient - ? Bony sclerosis, treated mets?  Discharge Diagnoses:  Principal Problem:   Esophagitis Active Problems:   Type 2 diabetes mellitus without complication (HCC)   Hypertension   Uterine cancer (HCC)   Malignant pleural effusion   Protein-calorie malnutrition, severe (HCC)   Odynophagia   Decreased urine output   Discharge Condition: stable  Diet recommendation: soft  There were no vitals filed for this visit.  History of present illness:  65 year old African-American female with Yvette Roberts past medical history metastatic uterine cancer, type 2 diabetes, hypertension malignant pleural effusion admitted with odynophagia and weight loss.  She reports losing 50 to 60 pounds in the last 3 months.  She underwent further work-up and had an EGD and found nonsevere esophagitis in the proximal and distal esophagus as well as scattered mild inflammation with erythema in the entire stomach.  She's been started on protonix and sucralfate.  Hospitalization complicated by febrile neutropenia and pseudomonas UTI, now s/p abx.  See below for additional details  Hospital Course:  Odynophagia and Esophagitis  Gastritis GERD -GI was consulted and underwent EGD on 03/29/21 which showed mild nonsevere esophagitis in the proximal and distal esophagus as well as scattered mild inflammation with erythema in the entire stomach but she had profound symptoms of  Esophagitis -Her Bx -biopsy from the stomach shows reactive gastropathy and negative GI H. pylori.  No metaplasia dysplasia or malignancy.  Biopsy from the esophagus shows mild acute esophagitis negative for fungus.  No metaplasia or dysplasia or malignancy. Continue oxycodone 10 mg q6 prn for pain -GI recommending continuing Pantoprazole BID and Carafate  1 gram po TIDwm and qHS   -Patient to follow up with GI in the outpatient setting   Febrile Neutropenia-no further fevers she finished Laurice Kimmons course of cefepime for 7 days.  Cefepime was stopped on 04/07/2021.   Pancytopenia- Hb relatively stable today --- s/p transfusion 04/09/2021.     Pseudomonas Aeruginosa UTI finished Marykathleen Russi course of cefepime.   Hypokalemia  Follow outpatient    Hypomagnesemia  Follow outpatient    Hypophosphatemia Follow outpatient   Refeeding syndrome monitor electrolytes  intermittently outpatient    Severe Protein Calorie Malnutrition -Nutritionist was consulted -she has just started to eat will need to monitor for refeeding syndrome.     Metastatic Uterine Cancer-followed by oncology holding off on further chemo at this time.  Oncology signed off. Oncology planning to follow outpatient in about 1 month   Malignant Pleural Effusion -CT Scan showed "Loculated RIGHT-sided pleural fluid with 6.5 x 3.6 cm axial dimension as compared to 8.1 x 3.9 cm. Basilar volume loss and signs of pleural thickening with decreased pleural thickening, markedly diminished since the prior PET and with continued decreased pleural thickening particularly in the inferior RIGHT chest even since the recent CT of the chest. Over the RIGHT hemidiaphragm pleural thickness approximately 7 mm as compared to 12 mm greatest thickness. LEFT chest is clear. Airways are patent." with the impression being "Decreased loculated pleural fluid and pleural thickening in the chest follows diminishing perihepatic soft  tissue and capsular hepatic implants as  described" -No Respiratory Distress Follow outpatient with oncology   Hypertension Continue coreg   Diabetes Mellitus Type 2 Follow outpatient A1c 6.1  Anxiety and Depression -C/w Mirtazapine 50 mg p.o. nightly and Fluoxetine 20 mg p.o. daily   Pressure Ulcer Pressure Injury 04/05/21 Sacrum Posterior;Medial;Lower Stage 2 -  Partial thickness loss of dermis presenting as Oland Arquette shallow open injury with Wofford Stratton red, pink wound bed without slough. Appears to be stage two, bleeding right inside of buttocks and upper part of (Active)  04/05/21 2030  Location: Sacrum  Location Orientation: Posterior;Medial;Lower  Staging: Stage 2 -  Partial thickness loss of dermis presenting as Wilberth Damon shallow open injury with Naylee Frankowski red, pink wound bed without slough.  Wound Description (Comments): Appears to be stage two, bleeding right inside of buttocks and upper part of bottom  Present on Admission: No  Continue frequent turns and wound care   Procedures: EGD - Non-severe esophagitis. Biopsied. - Gastritis. Biopsied. - Normal duodenal bulb, first portion of the duodenum and second portion of the duodenum.  Consultations: GI oncology  Discharge Exam: Vitals:   04/10/21 2144 04/11/21 0535  BP: 127/83 133/85  Pulse: 92 88  Resp: 16 16  Temp: 98.6 F (37 C) 98.5 F (36.9 C)  SpO2: 98% 98%   No complaints  General: No acute distress. Cardiovascular: Heart sounds show Daivik Overley regular rate, and rhythm.  Lungs: diminished on R, unlabored Abdomen: Soft, nontender, nondistended  Neurological: Alert and oriented 3. Moves all extremities 4 with equal strength. Cranial nerves II through XII grossly intact. Skin: Warm and dry. No rashes or lesions. Extremities: No clubbing or cyanosis. Trace edema. Discharge Instructions   Discharge Instructions     Call MD for:  difficulty breathing, headache or visual disturbances   Complete by: As directed    Call MD for:  extreme fatigue   Complete by: As directed    Call  MD for:  hives   Complete by: As directed    Call MD for:  persistant dizziness or light-headedness   Complete by: As directed    Call MD for:  persistant nausea and vomiting   Complete by: As directed    Call MD for:  redness, tenderness, or signs of infection (pain, swelling, redness, odor or green/yellow discharge around incision site)   Complete by: As directed    Call MD for:  severe uncontrolled pain   Complete by: As directed    Call MD for:  temperature >100.4   Complete by: As directed    DIET SOFT   Complete by: As directed    Diet - low sodium heart healthy   Complete by: As directed    Discharge instructions   Complete by: As directed    You were seen for odynophagia and esophagitis (pain with swallowing and inflammation of the esophagus).  You're being treated with protonix and sucralfate.  Please follow up with gastroenterology as an outpatient.  You were treated for Kirstie Larsen UTI and fever and low white blood cell count with antibiotics.  Please follow up with Dr. Alvy Bimler regarding your metastatic uterine cancer as an outpatient.  Follow up your malignant pleural effusion.    You have Phillipe Clemon pressure ulcer which should be followed with wound care outpatient.  Return for new, recurrent, or worsening symptoms.  Please ask your PCP to request records from this hospitalization so they know what was done and what the next steps will be.   Discharge  wound care:   Complete by: As directed    Silicone foam dressing to the gluteal cleft and buttocks   Discharge wound care:   Complete by: As directed    Silicone foam dressings to the gluteal cleft and buttocks change every 3 days. Assess under dressings each shift for any acute changes in the wounds.   Increase activity slowly   Complete by: As directed    Increase activity slowly   Complete by: As directed       Allergies as of 04/11/2021       Reactions   Eggs Or Egg-derived Products Hives   Influenza Vaccines Hives    Peanut-containing Drug Products Hives   Penicillins Hives   Sulfa Antibiotics Hives   Codeine Rash        Medication List     STOP taking these medications    amLODipine 5 MG tablet Commonly known as: NORVASC   famotidine 20 MG tablet Commonly known as: PEPCID   famotidine 40 MG/5ML suspension Commonly known as: PEPCID   HYDROmorphone 4 MG tablet Commonly known as: DILAUDID   lidocaine 5 % Commonly known as: LIDODERM   lidocaine-prilocaine cream Commonly known as: EMLA   omeprazole 40 MG capsule Commonly known as: PRILOSEC       TAKE these medications    carvedilol 12.5 MG tablet Commonly known as: COREG Take 12.5 mg by mouth 2 (two) times daily with Denver Bentson meal.   dexamethasone 4 MG tablet Commonly known as: DECADRON TAKE 2 TABLETS BY MOUTH NIGHT BEFORE AND 2 TABLETS IN THE MORNING OF CHEMO EVERY 3 WEEKS FOR 6 CYCLES   FLUoxetine 20 MG/5ML solution Commonly known as: PROZAC Take 5 mLs (20 mg total) by mouth daily. What changed:  how much to take how to take this when to take this   gabapentin 100 MG capsule Commonly known as: NEURONTIN Take 2 capsules (200 mg total) by mouth 2 (two) times daily.   Gerhardt's butt cream Crea Apply 1 application topically 3 (three) times daily.   magnesium oxide 400 (240 Mg) MG tablet Commonly known as: MAG-OX Take 2 tablets (800 mg total) by mouth daily.   mirtazapine 15 MG disintegrating tablet Commonly known as: REMERON SOL-TAB Dissolve  1 tablet (15 mg total) in mouth at bedtime.   multivitamin with minerals Tabs tablet Take 1 tablet by mouth daily.   ondansetron 8 MG tablet Commonly known as: ZOFRAN TAKE 1 TABLET BY MOUTH EVERY 8 HOURS AS NEEDED START ON THE THIRD DAY AFTER CHEMO What changed:  how much to take how to take this when to take this reasons to take this   Oxycodone HCl 10 MG Tabs Take 1 tablet (10 mg total) by mouth every 6 (six) hours as needed for up to 3 days for moderate pain.    pantoprazole sodium 40 mg/20 mL Pack Commonly known as: PROTONIX Take 20 mLs (40 mg total) by mouth 2 (two) times daily.   potassium chloride SA 20 MEQ tablet Commonly known as: KLOR-CON Take 2 tablets (40 mEq total) by mouth daily.   prochlorperazine 10 MG tablet Commonly known as: COMPAZINE TAKE 1 TABLET BY MOUTH EVERY 6 HOURS AS NEEDED FOR NAUSEA OR VOMITING What changed:  how much to take reasons to take this   sucralfate 1 g tablet Commonly known as: CARAFATE Take 1 tablet (1 g total) by mouth 4 (four) times daily -  with meals and at bedtime.   Vitamin D 125 MCG (5000 UT)  Caps Take 5,000 Units by mouth daily.               Discharge Care Instructions  (From admission, onward)           Start     Ordered   04/11/21 0000  Discharge wound care:       Comments: Silicone foam dressings to the gluteal cleft and buttocks change every 3 days. Assess under dressings each shift for any acute changes in the wounds.   04/11/21 1210   04/10/21 0000  Discharge wound care:       Comments: Silicone foam dressing to the gluteal cleft and buttocks   04/10/21 1058           Allergies  Allergen Reactions   Eggs Or Egg-Derived Products Hives   Influenza Vaccines Hives   Peanut-Containing Drug Products Hives   Penicillins Hives   Sulfa Antibiotics Hives   Codeine Rash    Follow-up Information     [name], FNP Follow up.   Specialty: Family Medicine Contact information: [address] [place] [address] [phone]         [name], MD Follow up.   Specialty: Hematology and Oncology Contact information: [place] [address] [phone]                  The results of significant diagnostics from this hospitalization (including imaging, microbiology, ancillary and laboratory) are listed below for reference.    Significant Diagnostic Studies: DG Chest 2 View  Result Date:  03/27/2021 CLINICAL DATA:  65 year old female with hypertension and diabetes. Stage IV uterine cancer. EXAM: CHEST - 2 VIEW COMPARISON:  Chest radiograph dated 02/25/2021. FINDINGS: Right-sided Port-[place] Timmons-Cath similar position. Small right pleural effusion similar to prior radiograph. Interval decrease in the size of the opacity seen in the right mid lung field. The left lung is clear. No pneumothorax. Stable cardiomediastinal silhouette. Degenerative changes of the spine. No acute osseous pathology. IMPRESSION: Decrease in the size of opacity in the right mid lung field compared to prior radiograph. Unchanged small right pleural effusion. Electronically Signed   By: Anner Crete M.D.   On: 03/27/2021 19:23   CT CHEST ABDOMEN PELVIS W CONTRAST  Result Date: 03/28/2021 CLINICAL DATA:  Ovarian cancer assess treatment response. EXAM: CT CHEST, ABDOMEN, AND PELVIS WITH CONTRAST TECHNIQUE: Multidetector CT imaging of the chest, abdomen and pelvis was performed following the standard protocol during bolus administration of intravenous contrast. CONTRAST:  140mL OMNIPAQUE IOHEXOL 300 MG/ML  SOLN COMPARISON:  PET-CT from December 08, 2020 and CT of the chest of Feb 25, 2021. FINDINGS: CT CHEST FINDINGS Cardiovascular: RIGHT-sided Port-Jacquez Sheetz-Cath terminates in the lower RIGHT atrium similar to recent CT imaging, perhaps influenced by arm position as on the chest x-ray the tip is at the caval to atrial junction. Heart size is normal without substantial pericardial effusion. Normal caliber of the thoracic aorta. Normal caliber of the central pulmonary vasculature. Mediastinum/Nodes: No thoracic inlet adenopathy. No axillary adenopathy. No mediastinal lymphadenopathy. Esophagus mildly patulous. Lungs/Pleura: Loculated RIGHT-sided pleural fluid with 6.5 x 3.6 cm axial dimension as compared to 8.1 x 3.9 cm. Basilar volume loss and signs of pleural thickening with decreased pleural thickening, markedly diminished since the prior PET  and with continued decreased pleural thickening particularly in the inferior RIGHT chest even since the recent CT of the chest. Over the RIGHT hemidiaphragm pleural thickness approximately 7 mm as compared to 12 mm greatest  thickness. LEFT chest is clear. Airways are patent. Musculoskeletal: See below for full musculoskeletal details. CT ABDOMEN PELVIS FINDINGS Hepatobiliary: Low-density lesions along the cephalad margin of the RIGHT hemi liver and across the top of the RIGHT hemidiaphragm and diminished in size considerably compared to the PET exam of February of 2022. Pleural thickening across the surface of the liver also with decreased thickness (image 47/2) 7 mm greatest thickness as compared to 14 mm greatest thickness. Area of low attenuation along the capsule of the liver (image 45/2) overlying hepatic subsegment Roman numeral 8 measuring 18 mm as compared to 20 mm on the most recent comparison and is much as 43 mm on the prior CT evaluation. Capsular implant with extension into hepatic parenchyma much less conspicuous today than on the previous PET-CT from February and the CT from Feb 25, 2021. This area measuring approximately 1.8 as compared to 2.4 cm based on comparison with the most recent study. Much smaller than on the study of February of 2022 where it measured approximately 41 mm. Cystic area along the gallbladder fossa and in the inferior aspect of the RIGHT hemi liver with stable appearance largest area measuring 3 cm. Portal vein is patent. Hepatic veins are patent. Cholelithiasis without pericholecystic stranding. No sign of biliary duct distension. Pancreas: Normal, without mass, inflammation or ductal dilatation. Spleen: Spleen normal size and contour. Adrenals/Urinary Tract: Adrenal glands are normal. Symmetric renal enhancement. No hydronephrosis. Smooth contour the urinary bladder. Stomach/Bowel: No acute gastrointestinal process soft tissue adjacent to the appendix in the RIGHT lower  quadrant without signs of adjacent stranding is an unchanged caliber of the appendix dating back to February 2021 in the setting of known peritoneal disease with diminished fluid in the pelvis and omental and serosal nodularity throughout the abdomen. Vascular/Lymphatic: Normal caliber abdominal aorta and IVC. There is no gastrohepatic or hepatoduodenal ligament lymphadenopathy. No retroperitoneal or mesenteric lymphadenopathy. Reproductive: Decreased bulky appearance of the uterus. RIGHT adnexal cystic and solid area measuring 4.5 x 2.5 cm as compared to 5.9 x 3.1 cm. Decreased ascites in the pelvis. Other: Decreased ascites as above. Decreased signs of omental nodularity and serosal nodularity. Liver capsular disease as described above. Discrete nodules in the LEFT omentum persist, largest approximately 6 mm (image 88/2) previously 6-7 mm. Focal thickening of the LEFT rectus muscle is diminished no measurable lesion in this area, thickness of the LEFT rectus muscle on image 103 of series 2 is 13 mm as compared to 24 mm. The adjacent omental and peritoneal nodularity and stranding is subjectively diminished since the prior study. Musculoskeletal: Spinal degenerative changes. New sclerotic focus in the RIGHT proximal femur in the femoral neck and head junction measuring 14 mm. New sclerotic focus at the T12 level (image 60/2) 13 mm. Sclerosis at the T12 level is present however on the study of May of 2022. Not seen on the PET exam of February 2022. Spinal degenerative changes. IMPRESSION: 1. Decreased loculated pleural fluid and pleural thickening in the chest follows diminishing perihepatic soft tissue and capsular hepatic implants as described. 2. No adenopathy by size criteria in the abdomen or pelvis. Minimal soft tissue in the intra-aortocaval groove is all that remains on image 77 of series 2 in the retroperitoneum. 3. Decreased bulk of the uterus subjectively and of RIGHT adnexal structures as described. 4.  New areas of bony sclerosis compatible with skeletal metastasis. No signs of increased FDG uptake and no visible sclerosis on the previous PET. In the context of improvement of  other disease it is possible that this represents treated metastasis. Would suggest attention on follow-up. 5. Loculated RIGHT-sided pleural fluid with signs of pleural thickening in the lower RIGHT chest otherwise with similar appearance. 6. Cholelithiasis without evidence of acute cholecystitis. 7. Aortic atherosclerosis. Electronically Signed   By: Zetta Bills M.D.   On: 03/28/2021 15:52   DG CHEST PORT 1 VIEW  Result Date: 04/01/2021 CLINICAL DATA:  Neutropenic fever EXAM: PORTABLE CHEST 1 VIEW COMPARISON:  March 31, 2021 FINDINGS: Stable small right-sided loculated pleural effusion. Hazy opacification of the right hemithorax with fluid in the minor fissure likely representing Kaladin Noseworthy small free-flowing pleural effusion. Atelectasis in the right lung base. Left lung is clear. Right chest wall Port-[place] Howerter-Cath with tip overlying the right atrium. The heart size and mediastinal contours are unchanged. Thoracic spondylosis with degenerative changes shoulders. IMPRESSION: Stable right-sided pleural fluid with adjacent atelectasis. Left lung is clear. Electronically Signed   By: Dahlia Bailiff MD   On: 04/01/2021 11:37   DG CHEST PORT 1 VIEW  Result Date: 03/31/2021 CLINICAL DATA:  Shortness of breath.  History of ovarian carcinoma EXAM: PORTABLE CHEST 1 VIEW COMPARISON:  Chest radiograph Mar 27, 2021; chest CT March 28, 2021 FINDINGS: Apparent loculated pleural effusion on the right remains. There is also an apparent free-flowing small right pleural effusion, not appreciably changed from recent CT. Atelectasis right base. Left lung clear. Heart is upper normal in size with pulmonary vascularity normal. Port-[place] Riding-Cath tip is in the right atrium slightly beyond the superior vena cava-right atrium junction. No adenopathy appreciable. Degenerative change  in each shoulder noted. No pneumothorax. IMPRESSION: Pleural effusion on the right, primarily loculated, essentially stable compared to recent CT examination. Right base atelectasis noted. Left lung clear. No new opacity evident. Stable cardiac silhouette. Port-Obryan Radu-Cath present on the right. Electronically Signed   By: Lowella Grip III M.D.   On: 03/31/2021 08:12    Microbiology: Recent Results (from the past 240 hour(s))  SARS CORONAVIRUS 2 (TAT 6-24 HRS) Nasopharyngeal Nasopharyngeal Swab     Status: None   Collection Time: 04/09/21  2:12 PM   Specimen: Nasopharyngeal Swab  Result Value Ref Range Status   SARS Coronavirus 2 NEGATIVE NEGATIVE Final    Comment: (NOTE) SARS-CoV-2 target nucleic acids are NOT DETECTED.  The SARS-CoV-2 RNA is generally detectable in upper and lower respiratory specimens during the acute phase of infection. Negative results do not preclude SARS-CoV-2 infection, do not rule out co-infections with other pathogens, and should not be used as the sole basis for treatment or other patient management decisions. Negative results must be combined with clinical observations, patient history, and epidemiological information. The expected result is Negative.  Fact Sheet for Patients: SugarRoll.be  Fact Sheet for Healthcare Providers: https://www.woods-mathews.com/  This test is not yet approved or cleared by the [place] FDA and  has been authorized for detection and/or diagnosis of SARS-CoV-2 by FDA under an Emergency Use Authorization (EUA). This EUA will remain  in effect (meaning this test can be used) for the duration of the COVID-19 declaration under Se ction 564(b)(1) of the Act, 21 U.S.C. section 360bbb-3(b)(1), unless the authorization is terminated or revoked sooner.  Performed at [facility], [place] [address] [place], [place] [address]      Labs: Basic Metabolic Panel: Recent Labs  Lab  04/05/21 0500 04/06/21 0533 04/07/21 0630 04/08/21 0523 04/09/21 0514 04/10/21 0602  NA 138 133* 132* 135 137  --   K 3.1* 3.5 3.2*  3.0* 3.3*  --   CL 102 97* 97* 103 104  --   CO2 30 28 26 26 27   --   GLUCOSE 137* 141* 119* 120* 101*  --   BUN 6* 5* 7* 5* <5*  --   CREATININE 0.39* 0.35* <0.30* 0.33* 0.39* 0.35*  CALCIUM 8.5* 8.6* 8.9 8.0* 8.2*  --   MG  --  1.1* 1.1* 1.5* 1.6*  --   PHOS 2.0* 2.8 2.3* 2.8 2.2*  --    Liver Function Tests: Recent Labs  Lab 04/05/21 0500  AST 19  ALT 26  ALKPHOS 40  BILITOT 0.6  PROT 5.3*  ALBUMIN 2.2*   No results for input(s): LIPASE, AMYLASE in the last 168 hours. No results for input(s): AMMONIA in the last 168 hours. CBC: Recent Labs  Lab 04/07/21 0630 04/08/21 1000 04/09/21 0514 04/10/21 0602 04/11/21 0549  WBC 3.4* 2.8* 3.3* 3.5* 4.1  NEUTROABS 1.7 1.3* 1.7 1.9 2.4  HGB 7.8* 7.8* 7.3* 8.7* 9.1*  HCT 23.3* 23.5* 22.7* 26.2* 27.5*  MCV 83.8 83.9 85.0 86.5 86.8  PLT 67* 72* 72* 87* 116*   Cardiac Enzymes: No results for input(s): CKTOTAL, CKMB, CKMBINDEX, TROPONINI in the last 168 hours. BNP: BNP (last 3 results) Recent Labs    11/23/20 0324 02/25/21 1625  BNP 101.0* 75.8    ProBNP (last 3 results) No results for input(s): PROBNP in the last 8760 hours.  CBG: Recent Labs  Lab 04/09/21 1543 04/10/21 0726 04/10/21 1233 04/10/21 1535 04/11/21 0746  GLUCAP 100* 93 102* 93 84       Signed:  Fayrene Helper MD.  [facility] 04/11/2021, 12:11 PM

## 2021-04-11 NOTE — TOC Transition Note (Addendum)
Transition of Care [facility]) - CM/SW Discharge Note   Patient Details  Name: Yvette Roberts MRN: [id number] Date of Birth: December 25, 1955  [facility] CM/SW Contact:  Leeroy Cha, RN Phone Number: 04/11/2021, 12:31 PM   Clinical Narrative:     [facility] called for transport at 1330 states that wait time 1-2 hours. Going to [facility]. Dc cancelled per md order   Barriers to Discharge: Continued Medical Work up   Patient Goals and CMS Choice Patient states their goals for this hospitalization and ongoing recovery are:: To go home      Discharge Placement                       Discharge Plan and Services   Discharge Planning Services: CM Consult                                 Social Determinants of Health (SDOH) Interventions     Readmission Risk Interventions Readmission Risk Prevention Plan 04/05/2021  Transportation Screening Complete  Medication Review Press photographer) Complete  PCP or Specialist appointment within 3-5 days of discharge Complete  HRI or [place] Complete  SW Recovery Care/Counseling Consult Complete  Palliative Care Screening Not [place] Complete  Some recent data might be hidden

## 2021-04-12 ENCOUNTER — Other Ambulatory Visit: Payer: Medicare Other

## 2021-04-12 ENCOUNTER — Inpatient Hospital Stay (HOSPITAL_COMMUNITY): Payer: Medicare Other

## 2021-04-12 ENCOUNTER — Ambulatory Visit: Payer: Medicare Other | Admitting: Hematology and Oncology

## 2021-04-12 ENCOUNTER — Ambulatory Visit: Payer: Medicare Other

## 2021-04-12 DIAGNOSIS — L8990 Pressure ulcer of unspecified site, unspecified stage: Secondary | ICD-10-CM | POA: Insufficient documentation

## 2021-04-12 DIAGNOSIS — K209 Esophagitis, unspecified without bleeding: Secondary | ICD-10-CM | POA: Diagnosis not present

## 2021-04-12 LAB — COMPREHENSIVE METABOLIC PANEL
ALT: 22 U/L (ref 0–44)
AST: 20 U/L (ref 15–41)
Albumin: 2.6 g/dL — ABNORMAL LOW (ref 3.5–5.0)
Alkaline Phosphatase: 52 U/L (ref 38–126)
Anion gap: 9 (ref 5–15)
BUN: 6 mg/dL — ABNORMAL LOW (ref 8–23)
CO2: 27 mmol/L (ref 22–32)
Calcium: 9.2 mg/dL (ref 8.9–10.3)
Chloride: 100 mmol/L (ref 98–111)
Creatinine, Ser: 0.38 mg/dL — ABNORMAL LOW (ref 0.44–1.00)
GFR, Estimated: >60 mL/min (ref >60)
Glucose, Bld: 104 mg/dL — ABNORMAL HIGH (ref 70–99)
Potassium: 3.7 mmol/L (ref 3.5–5.1)
Sodium: 136 mmol/L (ref 135–145)
Total Bilirubin: 0.7 mg/dL (ref 0.3–1.2)
Total Protein: 5.7 g/dL — ABNORMAL LOW (ref 6.5–8.1)

## 2021-04-12 LAB — CBC WITH DIFFERENTIAL/PLATELET
Abs Immature Granulocytes: 0.03 10*3/uL (ref 0.00–0.07)
Basophils Absolute: 0 10*3/uL (ref 0.0–0.1)
Basophils Relative: 0 %
Eosinophils Absolute: 0 10*3/uL (ref 0.0–0.5)
Eosinophils Relative: 0 %
HCT: 27.2 % — ABNORMAL LOW (ref 36.0–46.0)
Hemoglobin: 9.1 g/dL — ABNORMAL LOW (ref 12.0–15.0)
Immature Granulocytes: 1 %
Lymphocytes Relative: 37 %
Lymphs Abs: 1.4 10*3/uL (ref 0.7–4.0)
MCH: 29.1 pg (ref 26.0–34.0)
MCHC: 33.5 g/dL (ref 30.0–36.0)
MCV: 86.9 fL (ref 80.0–100.0)
Monocytes Absolute: 0.4 10*3/uL (ref 0.1–1.0)
Monocytes Relative: 11 %
Neutro Abs: 2 10*3/uL (ref 1.7–7.7)
Neutrophils Relative %: 51 %
Platelets: 145 10*3/uL — ABNORMAL LOW (ref 150–400)
RBC: 3.13 MIL/uL — ABNORMAL LOW (ref 3.87–5.11)
RDW: 17.7 % — ABNORMAL HIGH (ref 11.5–15.5)
WBC: 3.8 10*3/uL — ABNORMAL LOW (ref 4.0–10.5)
nRBC: 0 % (ref 0.0–0.2)

## 2021-04-12 LAB — GLUCOSE, CAPILLARY
Glucose-Capillary: 99 mg/dL (ref 70–99)
Glucose-Capillary: 97 mg/dL (ref 70–99)
Glucose-Capillary: 90 mg/dL (ref 70–99)

## 2021-04-12 LAB — VITAMIN B12: Vitamin B-12: 1457 pg/mL — ABNORMAL HIGH (ref 180–914)

## 2021-04-12 LAB — PHOSPHORUS: Phosphorus: 3.1 mg/dL (ref 2.5–4.6)

## 2021-04-12 LAB — MAGNESIUM: Magnesium: 1.2 mg/dL — ABNORMAL LOW (ref 1.7–2.4)

## 2021-04-12 LAB — CK: Total CK: 31 U/L — ABNORMAL LOW (ref 38–234)

## 2021-04-12 MED ORDER — LORAZEPAM 2 MG/ML IJ SOLN
1.0000 mg | Freq: Once | INTRAMUSCULAR | Status: AC | PRN
  Administered 2021-04-13: 1 mg via INTRAVENOUS
  Filled 2021-04-12: qty 1

## 2021-04-12 MED ORDER — MAGNESIUM SULFATE 4 GM/100ML IV SOLN
4.0000 g | Freq: Once | INTRAVENOUS | Status: AC
  Administered 2021-04-12: 4 g via INTRAVENOUS
  Filled 2021-04-12: qty 100

## 2021-04-12 NOTE — Progress Notes (Signed)
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

## 2021-04-12 NOTE — Progress Notes (Signed)
PROGRESS NOTE    Yvette Roberts  DVV:[id number] DOB: 02/24/1956 DOA: 03/27/2021 PCP: Yvette Hatchet, FNP   No chief complaint on file.  Brief Narrative:  66 year old African-American female with Yvette Roberts past medical history metastatic uterine cancer, type 2 diabetes, hypertension malignant pleural effusion admitted with odynophagia and weight loss.  She reports losing 50 to 60 pounds in the last 3 months.  She underwent further work-up and had an EGD and found nonsevere esophagitis in the proximal and distal esophagus as well as scattered mild inflammation with erythema in the entire stomach.  She's been started on protonix and sucralfate.  Hospitalization complicated by febrile neutropenia and pseudomonas UTI, now s/p abx.   Assessment & Plan:   Principal Problem:   Esophagitis Active Problems:   Type 2 diabetes mellitus without complication (HCC)   Hypertension   Uterine cancer (HCC)   Malignant pleural effusion   Protein-calorie malnutrition, severe (HCC)   Odynophagia   Decreased urine output   Pressure injury of skin  Bilateral Lower Extremity Weakness Sister in law notes weakness worse since being in hospital (says she walked into hospital with walker) ? Critical myopathy, per discussion with oncology - B12 wnl MRI L spine -> no regional metastatic disease, small focus of edema and enhancement at the inferior endplate of G26 favored to be discogenic in nature.  Multilevel degenerative disc disease and degenerative facet disease throughout the lumbar region.   Plan for MRI brain and T spine If unrevealing, would follow outpatient EMG/NCS  Odynophagia and Esophagitis  Gastritis GERD -GI was consulted and underwent EGD on 03/29/21 which showed mild nonsevere esophagitis in the proximal and distal esophagus as well as scattered mild inflammation with erythema in the entire stomach but she had profound symptoms of Esophagitis -Her Bx -biopsy from the stomach shows reactive  gastropathy and negative GI H. pylori.  No metaplasia dysplasia or malignancy.  Biopsy from the esophagus shows mild acute esophagitis negative for fungus.  No metaplasia or dysplasia or malignancy. Continue oxycodone 10 mg q6 prn for pain -GI recommending continuing Pantoprazole BID and Carafate  1 gram po TIDwm and qHS   -Patient to follow up with GI in the outpatient setting   Febrile Neutropenia-no further fevers she finished Neven Fina course of cefepime for 7 days.  Cefepime was stopped on 04/07/2021.    Pancytopenia- Hb relatively stable today --- s/p transfusion 04/09/2021.     Pseudomonas Aeruginosa UTI finished Maruice Pieroni course of cefepime.    Hypokalemia  Follow outpatient    Hypomagnesemia  Follow outpatient    Hypophosphatemia Follow outpatient   Refeeding syndrome monitor electrolytes  intermittently outpatient    Severe Protein Calorie Malnutrition -Nutritionist was consulted -she has just started to eat will need to monitor for refeeding syndrome.     Metastatic Uterine Cancer-followed by oncology holding off on further chemo at this time.  Oncology signed off. Oncology planning to follow outpatient in about 1 month   Malignant Pleural Effusion -CT Scan showed "Loculated RIGHT-sided pleural fluid with 6.5 x 3.6 cm axial dimension as compared to 8.1 x 3.9 cm. Basilar volume loss and signs of pleural thickening with decreased pleural thickening, markedly diminished since the prior PET and with continued decreased pleural thickening particularly in the inferior RIGHT chest even since the recent CT of the chest. Over the RIGHT hemidiaphragm pleural thickness approximately 7 mm as compared to 12 mm greatest thickness. LEFT chest is clear. Airways are patent." with the impression being "Decreased loculated pleural  fluid and pleural thickening in the chest follows diminishing perihepatic soft tissue and capsular hepatic implants as described" -No Respiratory Distress Follow outpatient with  oncology   Hypertension Continue coreg   Diabetes Mellitus Type 2 Follow outpatient A1c 6.1   Anxiety and Depression -C/w Mirtazapine 50 mg p.o. nightly and Fluoxetine 20 mg p.o. daily  Pressure Ulcer Pressure Injury 04/05/21 Sacrum Posterior;Medial;Lower Stage 2 -  Partial thickness loss of dermis presenting as Shyane Fossum shallow open injury with Hazelene Doten red, pink wound bed without slough. Appears to be stage two, bleeding right inside of buttocks and upper part of (Active)  04/05/21 2030  Location: Sacrum  Location Orientation: Posterior;Medial;Lower  Staging: Stage 2 -  Partial thickness loss of dermis presenting as Hani Campusano shallow open injury with Aracelys Glade red, pink wound bed without slough.  Wound Description (Comments): Appears to be stage two, bleeding right inside of buttocks and upper part of bottom  Present on Admission: No   DVT prophylaxis:lovenox Code Status: full  Family Communication: sister in law Disposition:   Status is: Inpatient  Remains inpatient appropriate because:Inpatient level of care appropriate due to severity of illness  Dispo: The patient is from: Home              Anticipated d/c is to: Home              Patient currently is not medically stable to d/c.   Difficult to place patient No       Consultants:  Oncology GI  Procedures:  EGD - Non-severe esophagitis. Biopsied. - Gastritis. Biopsied. - Normal duodenal bulb, first portion of the duodenum and second portion of the duodenum.    Antimicrobials: Anti-infectives (From admission, onward)    Start     Dose/Rate Route Frequency Ordered Stop   04/01/21 0930  ceFEPIme (MAXIPIME) 2 g in sodium chloride 0.9 % 100 mL IVPB  Status:  Discontinued        2 g 200 mL/hr over 30 Minutes Intravenous Every 8 hours 04/01/21 0839 04/07/21 1345   03/27/21 1800  fluconazole (DIFLUCAN) IVPB 200 mg  Status:  Discontinued        200 mg 100 mL/hr over 60 Minutes Intravenous Daily 03/27/21 1734 03/29/21 1633           Subjective: Le weakness for weeks? Unclear  No other complaints  Objective: Vitals:   04/11/21 1338 04/11/21 2105 04/12/21 0448 04/12/21 1327  BP: 118/90 131/82 132/89 127/88  Pulse: 93 96 98 94  Resp: 16 16 18 16   Temp: 99 F (37.2 C) 99.3 F (37.4 C) 99.2 F (37.3 C) 98 F (36.7 C)  TempSrc: Oral Oral Oral Oral  SpO2: 100% 99% 100% 97%  Height:        Intake/Output Summary (Last 24 hours) at 04/12/2021 1650 Last data filed at 04/12/2021 1403 Gross per 24 hour  Intake 236 ml  Output 1400 ml  Net -1164 ml   There were no vitals filed for this visit.  Examination:  General exam: Appears calm and comfortable  Respiratory system: Clear to auscultation. Respiratory effort normal. Cardiovascular system: S1 & S2 heard, RRR.  Gastrointestinal system: Abdomen is nondistended, soft and nontender. Central nervous system: Alert and oriented. Bilateral lower extremity weakness, unable to lift heel off bed, bilateral foot drop.  Symmetric upper extremity strength, 5/5.  Intact sensation to light touch. Extremities: no LEE Skin: No rashes, lesions or ulcers Psychiatry: Judgement and insight appear normal. Mood & affect appropriate.  Data Reviewed: I have personally reviewed following labs and imaging studies  CBC: Recent Labs  Lab 04/08/21 1000 04/09/21 0514 04/10/21 0602 04/11/21 0549 04/12/21 0530  WBC 2.8* 3.3* 3.5* 4.1 3.8*  NEUTROABS 1.3* 1.7 1.9 2.4 2.0  HGB 7.8* 7.3* 8.7* 9.1* 9.1*  HCT 23.5* 22.7* 26.2* 27.5* 27.2*  MCV 83.9 85.0 86.5 86.8 86.9  PLT 72* 72* 87* 116* 145*    Basic Metabolic Panel: Recent Labs  Lab 04/06/21 0533 04/07/21 0630 04/08/21 0523 04/09/21 0514 04/10/21 0602 04/11/21 2157 04/12/21 0530  NA 133* 132* 135 137  --   --  136  K 3.5 3.2* 3.0* 3.3*  --   --  3.7  CL 97* 97* 103 104  --   --  100  CO2 28 26 26 27   --   --  27  GLUCOSE 141* 119* 120* 101*  --   --  104*  BUN 5* 7* 5* <5*  --   --  6*  CREATININE 0.35* <0.30*  0.33* 0.39* 0.35*  --  0.38*  CALCIUM 8.6* 8.9 8.0* 8.2*  --   --  9.2  MG 1.1* 1.1* 1.5* 1.6*  --   --  1.2*  PHOS 2.8 2.3* 2.8 2.2*  --  3.1  --     GFR: CrCl cannot be calculated (Unknown ideal weight.).  Liver Function Tests: Recent Labs  Lab 04/12/21 0530  AST 20  ALT 22  ALKPHOS 52  BILITOT 0.7  PROT 5.7*  ALBUMIN 2.6*    CBG: Recent Labs  Lab 04/11/21 0746 04/11/21 1218 04/12/21 0722 04/12/21 1132 04/12/21 1641  GLUCAP 84 103* 99 97 90     Recent Results (from the past 240 hour(s))  SARS CORONAVIRUS 2 (TAT 6-24 HRS) Nasopharyngeal Nasopharyngeal Swab     Status: None   Collection Time: 04/09/21  2:12 PM   Specimen: Nasopharyngeal Swab  Result Value Ref Range Status   SARS Coronavirus 2 NEGATIVE NEGATIVE Final    Comment: (NOTE) SARS-CoV-2 target nucleic acids are NOT DETECTED.  The SARS-CoV-2 RNA is generally detectable in upper and lower respiratory specimens during the acute phase of infection. Negative results do not preclude SARS-CoV-2 infection, do not rule out co-infections with other pathogens, and should not be used as the sole basis for treatment or other patient management decisions. Negative results must be combined with clinical observations, patient history, and epidemiological information. The expected result is Negative.  Fact Sheet for Patients: SugarRoll.be  Fact Sheet for Healthcare Providers: https://www.woods-mathews.com/  This test is not yet approved or cleared by the [place] FDA and  has been authorized for detection and/or diagnosis of SARS-CoV-2 by FDA under an Emergency Use Authorization (EUA). This EUA will remain  in effect (meaning this test can be used) for the duration of the COVID-19 declaration under Se ction 564(b)(1) of the Act, 21 U.S.C. section 360bbb-3(b)(1), unless the authorization is terminated or revoked sooner.  Performed at [facility], [place]  [address] [place], [place] [address]          Radiology Studies: MR Lumbar Spine W Wo Contrast  Result Date: 04/11/2021 CLINICAL DATA:  Metastatic uterine cancer. Unable to move the lower extremities. EXAM: MRI LUMBAR SPINE WITHOUT AND WITH CONTRAST TECHNIQUE: Multiplanar and multiecho pulse sequences of the lumbar spine were obtained without and with intravenous contrast. CONTRAST:  20mL GADAVIST GADOBUTROL 1 MMOL/ML IV SOLN COMPARISON:  None. FINDINGS: Segmentation:  5 lumbar type vertebral bodies assumed. Alignment:  No malalignment. Vertebrae: No evidence of regional metastatic disease. Jory Tanguma small focus of edema and enhancement at the inferior endplate of G89 is favored to be discogenic. Conus medullaris and cauda equina: Conus extends to the L1 level. Conus and cauda equina appear normal. Paraspinal and other soft tissues: Enlarged uterus. Disc levels: Minimal non-compressive disc bulges from T11-12 through L1-2. L2-3: Endplate osteophytes and moderate bulging of the disc. Mild facet and ligamentous hypertrophy. Moderate canal stenosis, but without definite focal neural compression. Foraminal stenosis on the left primarily due to encroachment by facet arthropathy could affect the exiting left L2 nerve. L3-4: Endplate osteophytes and bulging of the disc. Facet and ligamentous hypertrophy. Moderate canal stenosis. Bilateral lateral recess stenosis could cause neural compression on either or both sides. Foraminal narrowing worse on the right could possibly affect the exiting right L3 nerve. L4-5: Endplate osteophytes and bulging of the disc. Facet and ligamentous hypertrophy. Mild stenosis of both lateral recesses but without definite neural compression. L5-S1: Mild bulging of the disc. No compressive canal or foraminal stenosis. IMPRESSION: No evidence of regional metastatic disease. Small focus of edema and enhancement at the inferior endplate of V69 favored to be discogenic in nature. Even if this did  represent Mclain Freer small bone metastasis, it would not relate to the clinical presentation. Multilevel degenerative disc disease and degenerative facet disease throughout the lumbar region as above. Some potential for neural compression as outlined at each level above, but I would not expect these abnormalities to present with an inability to move the lower extremities. Electronically Signed   By: Nelson Chimes M.D.   On: 04/11/2021 16:41        Scheduled Meds:  carvedilol  12.5 mg Oral BID WC   chlorhexidine  15 mL Mouth/Throat QID   Chlorhexidine Gluconate Cloth  6 each Topical Daily   enoxaparin (LOVENOX) injection  40 mg Subcutaneous Q24H   feeding supplement  237 mL Oral TID BM   FLUoxetine  20 mg Oral Daily   gabapentin  200 mg Oral BID   Gerhardt's butt cream   Topical BID   magic mouthwash w/lidocaine  2 mL Oral TID   magnesium oxide  800 mg Oral Daily   mouth rinse  15 mL Mouth Rinse q12n4p   melatonin  10 mg Oral QHS   mirtazapine  15 mg Oral QHS   multivitamin with minerals  1 tablet Oral Daily   nutrition supplement (JUVEN)  1 packet Oral BID WC   pantoprazole sodium  40 mg Oral BID   polyethylene glycol  17 g Oral Daily   potassium chloride  40 mEq Oral Daily   protein supplement  1 Scoop Oral TID WC   sucralfate  1 g Oral TID WC & HS   Continuous Infusions:   LOS: 16 days    Time spent: over 30 min    Fayrene Helper, MD [facility]   To contact the attending provider between 7A-7P or the covering provider during after hours 7P-7A, please log into the web site [email or web address] and access using universal [place] password for that web site. If you do not have the password, please call the hospital operator.  04/12/2021, 4:50 PM

## 2021-04-12 NOTE — TOC Progression Note (Signed)
Transition of Care [facility]) - Progression Note    Patient Details  Name: MORGANN WOODBURN MRN: [id number] Date of Birth: 1956-02-05  Transition of Care [facility]) CM/SW Contact  Leeroy Cha, RN Phone Number: 04/12/2021, 3:25 PM  Clinical Narrative:    Awaiting mri of the brain before patient can be considered to be transferred to snf.  Juliann Pulse at [facility] health care is aware.   Expected Discharge Plan: Skilled Nursing Facility Barriers to Discharge: Continued Medical Work up  Expected Discharge Plan and Services Expected Discharge Plan: [place]   Discharge Planning Services: CM Consult   Living arrangements for the past 2 months: Apartment Expected Discharge Date: 04/11/21                                     Social Determinants of Health (SDOH) Interventions    Readmission Risk Interventions Readmission Risk Prevention Plan 04/05/2021  Transportation Screening Complete  Medication Review Press photographer) Complete  PCP or Specialist appointment within 3-5 days of discharge Complete  HRI or Home Care Consult Complete  SW Recovery Care/Counseling Consult Complete  Palliative Care Screening Not [place] Complete  Some recent data might be hidden

## 2021-04-13 ENCOUNTER — Inpatient Hospital Stay (HOSPITAL_COMMUNITY): Payer: Medicare Other

## 2021-04-13 DIAGNOSIS — K209 Esophagitis, unspecified without bleeding: Secondary | ICD-10-CM | POA: Diagnosis not present

## 2021-04-13 LAB — COMPREHENSIVE METABOLIC PANEL
ALT: 22 U/L (ref 0–44)
AST: 21 U/L (ref 15–41)
Albumin: 2.6 g/dL — ABNORMAL LOW (ref 3.5–5.0)
Alkaline Phosphatase: 53 U/L (ref 38–126)
Anion gap: 10 (ref 5–15)
BUN: 6 mg/dL — ABNORMAL LOW (ref 8–23)
CO2: 28 mmol/L (ref 22–32)
Calcium: 9.2 mg/dL (ref 8.9–10.3)
Chloride: 100 mmol/L (ref 98–111)
Creatinine, Ser: 0.35 mg/dL — ABNORMAL LOW (ref 0.44–1.00)
GFR, Estimated: >60 mL/min (ref >60)
Glucose, Bld: 112 mg/dL — ABNORMAL HIGH (ref 70–99)
Potassium: 3.5 mmol/L (ref 3.5–5.1)
Sodium: 138 mmol/L (ref 135–145)
Total Bilirubin: 0.7 mg/dL (ref 0.3–1.2)
Total Protein: 6 g/dL — ABNORMAL LOW (ref 6.5–8.1)

## 2021-04-13 LAB — CBC WITH DIFFERENTIAL/PLATELET
Abs Immature Granulocytes: 0.02 10*3/uL (ref 0.00–0.07)
Basophils Absolute: 0 10*3/uL (ref 0.0–0.1)
Basophils Relative: 0 %
Eosinophils Absolute: 0 10*3/uL (ref 0.0–0.5)
Eosinophils Relative: 0 %
HCT: 27.7 % — ABNORMAL LOW (ref 36.0–46.0)
Hemoglobin: 9.3 g/dL — ABNORMAL LOW (ref 12.0–15.0)
Immature Granulocytes: 1 %
Lymphocytes Relative: 30 %
Lymphs Abs: 1.2 10*3/uL (ref 0.7–4.0)
MCH: 29.2 pg (ref 26.0–34.0)
MCHC: 33.6 g/dL (ref 30.0–36.0)
MCV: 87.1 fL (ref 80.0–100.0)
Monocytes Absolute: 0.5 10*3/uL (ref 0.1–1.0)
Monocytes Relative: 12 %
Neutro Abs: 2.3 10*3/uL (ref 1.7–7.7)
Neutrophils Relative %: 57 %
Platelets: 182 10*3/uL (ref 150–400)
RBC: 3.18 MIL/uL — ABNORMAL LOW (ref 3.87–5.11)
RDW: 17.7 % — ABNORMAL HIGH (ref 11.5–15.5)
WBC: 3.9 10*3/uL — ABNORMAL LOW (ref 4.0–10.5)
nRBC: 0 % (ref 0.0–0.2)

## 2021-04-13 LAB — GLUCOSE, CAPILLARY
Glucose-Capillary: 116 mg/dL — ABNORMAL HIGH (ref 70–99)
Glucose-Capillary: 118 mg/dL — ABNORMAL HIGH (ref 70–99)
Glucose-Capillary: 108 mg/dL — ABNORMAL HIGH (ref 70–99)

## 2021-04-13 LAB — MAGNESIUM: Magnesium: 1.7 mg/dL (ref 1.7–2.4)

## 2021-04-13 MED ORDER — GADOBUTROL 1 MMOL/ML IV SOLN
7.5000 mL | Freq: Once | INTRAVENOUS | Status: AC | PRN
  Administered 2021-04-13: 7.5 mL via INTRAVENOUS

## 2021-04-13 NOTE — Progress Notes (Signed)
Physical Therapy Treatment Patient Details Name: Yvette Roberts MRN: [id number] DOB: May 08, 1956 Today's Date: 04/13/2021    History of Present Illness Yvette Roberts is a 65 year old female presents with severe odynophagia the resultant minimal intake and profound weight loss. EGD on 03/29/21 which showed mild nonsevere esophagitis. PMH: hypertension, COPD, diabetes, stage IV uterine cancer, history of malignant pleural effusion diagnosed in 2/22, currently on chemotherapy    PT Comments    Pt significantly weaker this session compared to eval. Pt's active dorsiflexion 1/5. Pt unable to clear foot from bed with SAQ, though quad activation noted. Pt unable to abd/add BLE or move them around in the bed to comfort, requires max A to float bil heels with pillow. Pt with difficulty lifting BUE, opening hands for high five and difficulty making fist. Pt's sister in law with multiple concerns, notified RN of pt's inability to use call button and requests soft call button in room. Pt unable to extend pinky, ring and middle finger on both hands, but passively able to achieve extension. No clonus noted in BLE, pt prefers plantarflexed foot position. MRI coming for pt so transfers not attempted. Noted lumbar MRI results; will f/u with brain MRI. Will continue acute PT as able.   Follow Up Recommendations  SNF     Equipment Recommendations  None recommended by PT    Recommendations for Other Services       Precautions / Restrictions Precautions Precautions: Fall Precaution Comments: B LE paresis (profound weakness) Restrictions Weight Bearing Restrictions: No    Mobility  Bed Mobility     Transfers     Ambulation/Gait      Stairs             Wheelchair Mobility    Modified Rankin (Stroke Patients Only)       Balance             Cognition Arousal/Alertness: Awake/alert Behavior During Therapy: WFL for tasks assessed/performed Overall Cognitive Status:  Impaired/Different from baseline  General Comments: pt's sister in law at bedside, states pt appears to be joking like normal but doesn't appear to understand what people are telling her. Pt requires multimodal cues with exercises and increased time to perform.      Exercises General Exercises - Lower Extremity Ankle Circles/Pumps: Supine;AAROM;Both;10 reps Short Arc Quad: Supine;AAROM;Both;10 reps Heel Slides: Supine;AAROM;Both;5 reps    General Comments        Pertinent Vitals/Pain Pain Assessment: No/denies pain    Home Living                      Prior Function            PT Goals (current goals can now be found in the care plan section) Acute Rehab PT Goals Patient Stated Goal: return home PT Goal Formulation: With patient Time For Goal Achievement: 04/18/21 Potential to Achieve Goals: Fair Progress towards PT goals: Not progressing toward goals - comment (significant BLE weakness)    Frequency    Min 2X/week      PT Plan Current plan remains appropriate    Co-evaluation              AM-PAC PT "6 Clicks" Mobility   Outcome Measure  Help needed turning from your back to your side while in a flat bed without using bedrails?: Total Help needed moving from lying on your back to sitting on the side of a flat bed without using bedrails?: Total Help needed  moving to and from a bed to a chair (including a wheelchair)?: Total Help needed standing up from a chair using your arms (e.g., wheelchair or bedside chair)?: Total Help needed to walk in hospital room?: Total Help needed climbing 3-5 steps with a railing? : Total 6 Click Score: 6    End of Session Equipment Utilized During Treatment: Gait belt Activity Tolerance: Treatment limited secondary to medical complications (Comment) (significant weakness) Patient left: in bed;with call bell/phone within reach;with bed alarm set;with family/visitor present Nurse Communication: Mobility status;Other  (comment) (weakness, soft call bell due to inability to use regular call button) PT Visit Diagnosis: Other abnormalities of gait and mobility (R26.89);Muscle weakness (generalized) (M62.81)     Time: 6812-7517 PT Time Calculation (min) (ACUTE ONLY): 22 min  Charges:  $Therapeutic Exercise: 8-22 mins                      Tori Chaunda Vandergriff PT, DPT 04/13/21, 1:10 PM

## 2021-04-13 NOTE — TOC Progression Note (Signed)
Transition of Care [facility]) - Progression Note    Patient Details  Name: Yvette Roberts MRN: [id number] Date of Birth: 1956/09/03  Transition of Care [facility]) CM/SW Contact  Leeroy Cha, RN Phone Number: 04/13/2021, 1:24 PM  Clinical Narrative:    Awaiting resuts of mri of brain ordered on [id number].  Bed at [facility] when ready.   Expected Discharge Plan: Skilled Nursing Facility Barriers to Discharge: Continued Medical Work up  Expected Discharge Plan and Services Expected Discharge Plan: [place]   Discharge Planning Services: CM Consult   Living arrangements for the past 2 months: Apartment Expected Discharge Date: 04/11/21                                     Social Determinants of Health (SDOH) Interventions    Readmission Risk Interventions Readmission Risk Prevention Plan 04/05/2021  Transportation Screening Complete  Medication Review Press photographer) Complete  PCP or Specialist appointment within 3-5 days of discharge Complete  HRI or Home Care Consult Complete  SW Recovery Care/Counseling Consult Complete  Palliative Care Screening Not [place] Complete  Some recent data might be hidden

## 2021-04-13 NOTE — Progress Notes (Signed)
PROGRESS NOTE    Yvette Roberts  GBT:[id number] DOB: [birth year] DOA: 03/27/2021 PCP: [name], FNP   No chief complaint on file.  Brief Narrative:  65 year old African-American female with Britton Bera past medical history metastatic uterine cancer, type 2 diabetes, hypertension malignant pleural effusion admitted with odynophagia and weight loss.  She reports losing 50 to 60 pounds in the last 3 months.  She underwent further work-up and had an EGD and found nonsevere esophagitis in the proximal and distal esophagus as well as scattered mild inflammation with erythema in the entire stomach.  She's been started on protonix and sucralfate.  Hospitalization complicated by febrile neutropenia and pseudomonas UTI, now s/p abx.   Assessment & Plan:   Principal Problem:   Esophagitis Active Problems:   Type 2 diabetes mellitus without complication (HCC)   Hypertension   Uterine cancer (HCC)   Malignant pleural effusion   Protein-calorie malnutrition, severe (HCC)   Odynophagia   Decreased urine output   Pressure injury of skin  Bilateral Lower Extremity Weakness Sister in law notes weakness worse since being in hospital (says she walked into hospital with walker) - therapy notes significantly weaker today as well.   ? Critical myopathy, per discussion with oncology - B12 wnl MRI L spine -> no regional metastatic disease, small focus of edema and enhancement at the inferior endplate of X10 favored to be discogenic in nature.  Multilevel degenerative disc disease and degenerative facet disease throughout the lumbar region.   Plan for MRI brain, T spine, C spine imaging If unrevealing, would follow outpatient EMG/NCS  Odynophagia and Esophagitis  Gastritis GERD -GI was consulted and underwent EGD on 03/29/21 which showed mild nonsevere esophagitis in the proximal and distal esophagus as well as scattered mild inflammation with erythema in the entire stomach but she had profound symptoms of  Esophagitis -Her Bx -biopsy from the stomach shows reactive gastropathy and negative GI H. pylori.  No metaplasia dysplasia or malignancy.  Biopsy from the esophagus shows mild acute esophagitis negative for fungus.  No metaplasia or dysplasia or malignancy. Continue oxycodone 10 mg q6 prn for pain -GI recommending continuing Pantoprazole BID and Carafate  1 gram po TIDwm and qHS   -Patient to follow up with GI in the outpatient setting   Febrile Neutropenia-no further fevers she finished Fraida Veldman course of cefepime for 7 days.  Cefepime was stopped on 04/07/2021.    Pancytopenia- Hb relatively stable today --- s/p transfusion 04/09/2021.     Pseudomonas Aeruginosa UTI finished Tempie Gibeault course of cefepime.    Hypokalemia  Follow outpatient    Hypomagnesemia  Follow outpatient    Hypophosphatemia Follow outpatient   Refeeding syndrome monitor electrolytes  intermittently outpatient    Severe Protein Calorie Malnutrition -Nutritionist was consulted -she has just started to eat will need to monitor for refeeding syndrome.     Metastatic Uterine Cancer-followed by oncology holding off on further chemo at this time.  Oncology signed off. Oncology planning to follow outpatient in about 1 month   Malignant Pleural Effusion -CT Scan showed "Loculated RIGHT-sided pleural fluid with 6.5 x 3.6 cm axial dimension as compared to 8.1 x 3.9 cm. Basilar volume loss and signs of pleural thickening with decreased pleural thickening, markedly diminished since the prior PET and with continued decreased pleural thickening particularly in the inferior RIGHT chest even since the recent CT of the chest. Over the RIGHT hemidiaphragm pleural thickness approximately 7 mm as compared to 12 mm greatest thickness. LEFT chest  is clear. Airways are patent." with the impression being "Decreased loculated pleural fluid and pleural thickening in the chest follows diminishing perihepatic soft tissue and capsular hepatic implants as  described" -No Respiratory Distress Follow outpatient with oncology   Hypertension Continue coreg   Diabetes Mellitus Type 2 Follow outpatient A1c 6.1   Anxiety and Depression -C/w Mirtazapine 50 mg p.o. nightly and Fluoxetine 20 mg p.o. daily  Pressure Ulcer Pressure Injury 04/05/21 Sacrum Posterior;Medial;Lower Stage 2 -  Partial thickness loss of dermis presenting as Analleli Gierke shallow open injury with [place] red, pink wound bed without slough. Appears to be stage two, bleeding right inside of buttocks and upper part of (Active)  04/05/21 2030  Location: Sacrum  Location Orientation: Posterior;Medial;Lower  Staging: Stage 2 -  Partial thickness loss of dermis presenting as Kieryn Burtis shallow open injury with Summer Parthasarathy red, pink wound bed without slough.  Wound Description (Comments): Appears to be stage two, bleeding right inside of buttocks and upper part of bottom  Present on Admission: No   DVT prophylaxis:lovenox Code Status: full  Family Communication: sister in law Disposition:   Status is: Inpatient  Remains inpatient appropriate because:Inpatient level of care appropriate due to severity of illness  Dispo: The patient is from: Home              Anticipated d/c is to: Home              Patient currently is not medically stable to d/c.   Difficult to place patient No       Consultants:  Oncology GI  Procedures:  EGD - Non-severe esophagitis. Biopsied. - Gastritis. Biopsied. - Normal duodenal bulb, first portion of the duodenum and second portion of the duodenum.    Antimicrobials: Anti-infectives (From admission, onward)    Start     Dose/Rate Route Frequency Ordered Stop   04/01/21 0930  ceFEPIme (MAXIPIME) 2 g in sodium chloride 0.9 % 100 mL IVPB  Status:  Discontinued        2 g 200 mL/hr over 30 Minutes Intravenous Every 8 hours 04/01/21 0839 04/07/21 1345   03/27/21 1800  fluconazole (DIFLUCAN) IVPB 200 mg  Status:  Discontinued        200 mg 100 mL/hr over 60 Minutes  Intravenous Daily 03/27/21 1734 03/29/21 1633          Subjective: No new complaints  Objective: Vitals:   04/12/21 2038 04/13/21 0520 04/13/21 0658 04/13/21 1329  BP: 139/79 (!) 146/105 (!) 136/95 (!) 146/91  Pulse: 90 95 100 96  Resp: 16 18  20   Temp: 98.6 F (37 C) 97.6 F (36.4 C)  98.9 F (37.2 C)  TempSrc: Oral Oral  Oral  SpO2: 98% 99%  97%  Height:        Intake/Output Summary (Last 24 hours) at 04/13/2021 1427 Last data filed at 04/13/2021 0900 Gross per 24 hour  Intake 358 ml  Output --  Net 358 ml   There were no vitals filed for this visit.  Examination:  ,General: No acute distress. Cardiovascular: Heart sounds show Joah Patlan regular rate, and rhythm.  Lungs: Clear to auscultation bilaterally Abdomen: Soft, nontender, nondistended Neurological: Alert and oriented 3. Bilateral lower extremity weakness (unable to lift heels off bed), bilateral foot drop  Skin: Warm and dry. No rashes or lesions. Extremities: No clubbing or cyanosis. No edema.     Data Reviewed: I have personally reviewed following labs and imaging studies  CBC: Recent Labs  Lab 04/09/21  3875 04/10/21 0602 04/11/21 0549 04/12/21 0530 04/13/21 0522  WBC 3.3* 3.5* 4.1 3.8* 3.9*  NEUTROABS 1.7 1.9 2.4 2.0 2.3  HGB 7.3* 8.7* 9.1* 9.1* 9.3*  HCT 22.7* 26.2* 27.5* 27.2* 27.7*  MCV 85.0 86.5 86.8 86.9 87.1  PLT 72* 87* 116* 145* 643    Basic Metabolic Panel: Recent Labs  Lab 04/07/21 0630 04/08/21 0523 04/09/21 0514 04/10/21 0602 04/11/21 2157 04/12/21 0530 04/12/21 0537 04/13/21 0522  NA 132* 135 137  --   --  136  --  138  K 3.2* 3.0* 3.3*  --   --  3.7  --  3.5  CL 97* 103 104  --   --  100  --  100  CO2 26 26 27   --   --  27  --  28  GLUCOSE 119* 120* 101*  --   --  104*  --  112*  BUN 7* 5* <5*  --   --  6*  --  6*  CREATININE <0.30* 0.33* 0.39* 0.35*  --  0.38*  --  0.35*  CALCIUM 8.9 8.0* 8.2*  --   --  9.2  --  9.2  MG 1.1* 1.5* 1.6*  --   --  1.2*  --  1.7  PHOS  2.3* 2.8 2.2*  --  3.1  --  3.1  --     GFR: CrCl cannot be calculated (Unknown ideal weight.).  Liver Function Tests: Recent Labs  Lab 04/12/21 0530 04/13/21 0522  AST 20 21  ALT 22 22  ALKPHOS 52 53  BILITOT 0.7 0.7  PROT 5.7* 6.0*  ALBUMIN 2.6* 2.6*    CBG: Recent Labs  Lab 04/12/21 0722 04/12/21 1132 04/12/21 1641 04/13/21 0708 04/13/21 1153  GLUCAP 99 97 90 116* 118*     Recent Results (from the past 240 hour(s))  SARS CORONAVIRUS 2 (TAT 6-24 HRS) Nasopharyngeal Nasopharyngeal Swab     Status: None   Collection Time: 04/09/21  2:12 PM   Specimen: Nasopharyngeal Swab  Result Value Ref Range Status   SARS Coronavirus 2 NEGATIVE NEGATIVE Final    Comment: (NOTE) SARS-CoV-2 target nucleic acids are NOT DETECTED.  The SARS-CoV-2 RNA is generally detectable in upper and lower respiratory specimens during the acute phase of infection. Negative results do not preclude SARS-CoV-2 infection, do not rule out co-infections with other pathogens, and should not be used as the sole basis for treatment or other patient management decisions. Negative results must be combined with clinical observations, patient history, and epidemiological information. The expected result is Negative.  Fact Sheet for Patients: SugarRoll.be  Fact Sheet for Healthcare Providers: https://www.woods-mathews.com/  This test is not yet approved or cleared by the [place] FDA and  has been authorized for detection and/or diagnosis of SARS-CoV-2 by FDA under an Emergency Use Authorization (EUA). This EUA will remain  in effect (meaning this test can be used) for the duration of the COVID-19 declaration under Se ction 564(b)(1) of the Act, 21 U.S.C. section 360bbb-3(b)(1), unless the authorization is terminated or revoked sooner.  Performed at [facility], [place] [address], [place], [place] [address]          Radiology Studies: MR  Lumbar Spine W Wo Contrast  Result Date: 04/11/2021 CLINICAL DATA:  Metastatic uterine cancer. Unable to move the lower extremities. EXAM: MRI LUMBAR SPINE WITHOUT AND WITH CONTRAST TECHNIQUE: Multiplanar and multiecho pulse sequences of the lumbar spine were obtained without and with intravenous contrast.  CONTRAST:  3mL GADAVIST GADOBUTROL 1 MMOL/ML IV SOLN COMPARISON:  None. FINDINGS: Segmentation:  5 lumbar type vertebral bodies assumed. Alignment:  No malalignment. Vertebrae: No evidence of regional metastatic disease. Karry Causer small focus of edema and enhancement at the inferior endplate of V85 is favored to be discogenic. Conus medullaris and cauda equina: Conus extends to the L1 level. Conus and cauda equina appear normal. Paraspinal and other soft tissues: Enlarged uterus. Disc levels: Minimal non-compressive disc bulges from T11-12 through L1-2. L2-3: Endplate osteophytes and moderate bulging of the disc. Mild facet and ligamentous hypertrophy. Moderate canal stenosis, but without definite focal neural compression. Foraminal stenosis on the left primarily due to encroachment by facet arthropathy could affect the exiting left L2 nerve. L3-4: Endplate osteophytes and bulging of the disc. Facet and ligamentous hypertrophy. Moderate canal stenosis. Bilateral lateral recess stenosis could cause neural compression on either or both sides. Foraminal narrowing worse on the right could possibly affect the exiting right L3 nerve. L4-5: Endplate osteophytes and bulging of the disc. Facet and ligamentous hypertrophy. Mild stenosis of both lateral recesses but without definite neural compression. L5-S1: Mild bulging of the disc. No compressive canal or foraminal stenosis. IMPRESSION: No evidence of regional metastatic disease. Small focus of edema and enhancement at the inferior endplate of F29 favored to be discogenic in nature. Even if this did represent Deyonte Cadden small bone metastasis, it would not relate to the clinical  presentation. Multilevel degenerative disc disease and degenerative facet disease throughout the lumbar region as above. Some potential for neural compression as outlined at each level above, but I would not expect these abnormalities to present with an inability to move the lower extremities. Electronically Signed   By: Nelson Chimes M.D.   On: 04/11/2021 16:41        Scheduled Meds:  carvedilol  12.5 mg Oral BID WC   chlorhexidine  15 mL Mouth/Throat QID   Chlorhexidine Gluconate Cloth  6 each Topical Daily   enoxaparin (LOVENOX) injection  40 mg Subcutaneous Q24H   feeding supplement  237 mL Oral TID BM   FLUoxetine  20 mg Oral Daily   gabapentin  200 mg Oral BID   Gerhardt's butt cream   Topical BID   magic mouthwash w/lidocaine  2 mL Oral TID   magnesium oxide  800 mg Oral Daily   mouth rinse  15 mL Mouth Rinse q12n4p   melatonin  10 mg Oral QHS   mirtazapine  15 mg Oral QHS   multivitamin with minerals  1 tablet Oral Daily   nutrition supplement (JUVEN)  1 packet Oral BID WC   pantoprazole sodium  40 mg Oral BID   polyethylene glycol  17 g Oral Daily   potassium chloride  40 mEq Oral Daily   protein supplement  1 Scoop Oral TID WC   sucralfate  1 g Oral TID WC & HS   Continuous Infusions:   LOS: 17 days    Time spent: over 30 min    Fayrene Helper, MD [facility]   To contact the attending provider between 7A-7P or the covering provider during after hours 7P-7A, please log into the web site [email or web address] and access using universal [place] password for that web site. If you do not have the password, please call the hospital operator.  04/13/2021, 2:27 PM

## 2021-04-14 DIAGNOSIS — K209 Esophagitis, unspecified without bleeding: Secondary | ICD-10-CM | POA: Diagnosis not present

## 2021-04-14 DIAGNOSIS — G8250 Quadriplegia, unspecified: Secondary | ICD-10-CM

## 2021-04-14 LAB — CBC WITH DIFFERENTIAL/PLATELET
Abs Immature Granulocytes: 0.02 10*3/uL (ref 0.00–0.07)
Basophils Absolute: 0 10*3/uL (ref 0.0–0.1)
Basophils Relative: 0 %
Eosinophils Absolute: 0 10*3/uL (ref 0.0–0.5)
Eosinophils Relative: 0 %
HCT: 28.7 % — ABNORMAL LOW (ref 36.0–46.0)
Hemoglobin: 9.4 g/dL — ABNORMAL LOW (ref 12.0–15.0)
Immature Granulocytes: 1 %
Lymphocytes Relative: 38 %
Lymphs Abs: 1.3 10*3/uL (ref 0.7–4.0)
MCH: 28.9 pg (ref 26.0–34.0)
MCHC: 32.8 g/dL (ref 30.0–36.0)
MCV: 88.3 fL (ref 80.0–100.0)
Monocytes Absolute: 0.5 10*3/uL (ref 0.1–1.0)
Monocytes Relative: 14 %
Neutro Abs: 1.6 10*3/uL — ABNORMAL LOW (ref 1.7–7.7)
Neutrophils Relative %: 47 %
Platelets: 211 10*3/uL (ref 150–400)
RBC: 3.25 MIL/uL — ABNORMAL LOW (ref 3.87–5.11)
RDW: 18.1 % — ABNORMAL HIGH (ref 11.5–15.5)
WBC: 3.4 10*3/uL — ABNORMAL LOW (ref 4.0–10.5)
nRBC: 0 % (ref 0.0–0.2)

## 2021-04-14 LAB — GLUCOSE, CAPILLARY
Glucose-Capillary: 96 mg/dL (ref 70–99)
Glucose-Capillary: 100 mg/dL — ABNORMAL HIGH (ref 70–99)
Glucose-Capillary: 107 mg/dL — ABNORMAL HIGH (ref 70–99)

## 2021-04-14 LAB — COMPREHENSIVE METABOLIC PANEL
ALT: 23 U/L (ref 0–44)
AST: 23 U/L (ref 15–41)
Albumin: 2.8 g/dL — ABNORMAL LOW (ref 3.5–5.0)
Alkaline Phosphatase: 51 U/L (ref 38–126)
Anion gap: 9 (ref 5–15)
BUN: 8 mg/dL (ref 8–23)
CO2: 29 mmol/L (ref 22–32)
Calcium: 9.3 mg/dL (ref 8.9–10.3)
Chloride: 99 mmol/L (ref 98–111)
Creatinine, Ser: 0.34 mg/dL — ABNORMAL LOW (ref 0.44–1.00)
GFR, Estimated: >60 mL/min (ref >60)
Glucose, Bld: 102 mg/dL — ABNORMAL HIGH (ref 70–99)
Potassium: 3.3 mmol/L — ABNORMAL LOW (ref 3.5–5.1)
Sodium: 137 mmol/L (ref 135–145)
Total Bilirubin: 0.7 mg/dL (ref 0.3–1.2)
Total Protein: 5.9 g/dL — ABNORMAL LOW (ref 6.5–8.1)

## 2021-04-14 LAB — MAGNESIUM: Magnesium: 1.4 mg/dL — ABNORMAL LOW (ref 1.7–2.4)

## 2021-04-14 LAB — PHOSPHORUS: Phosphorus: 3.6 mg/dL (ref 2.5–4.6)

## 2021-04-14 MED ORDER — THIAMINE HCL 100 MG/ML IJ SOLN
500.0000 mg | Freq: Three times a day (TID) | INTRAVENOUS | Status: AC
  Administered 2021-04-14 – 2021-04-17 (×9): 500 mg via INTRAVENOUS
  Filled 2021-04-14 (×10): qty 5

## 2021-04-14 MED ORDER — THIAMINE HCL 100 MG/ML IJ SOLN
100.0000 mg | Freq: Every day | INTRAMUSCULAR | Status: DC
  Administered 2021-04-18: 100 mg via INTRAVENOUS
  Filled 2021-04-14: qty 2

## 2021-04-14 MED ORDER — POTASSIUM CHLORIDE CRYS ER 20 MEQ PO TBCR
40.0000 meq | EXTENDED_RELEASE_TABLET | Freq: Once | ORAL | Status: AC
  Administered 2021-04-14: 40 meq via ORAL
  Filled 2021-04-14: qty 2

## 2021-04-14 MED ORDER — MAGNESIUM SULFATE 2 GM/50ML IV SOLN
2.0000 g | Freq: Once | INTRAVENOUS | Status: AC
  Administered 2021-04-14: 2 g via INTRAVENOUS
  Filled 2021-04-14: qty 50

## 2021-04-14 NOTE — TOC Progression Note (Signed)
Transition of Care [facility]) - Progression Note    Patient Details  Name: Yvette Roberts MRN: [id number] Date of Birth: [birth year]  Transition of Care [facility]) CM/SW [place], LCSW Phone Number: 04/14/2021, 2:21 PM  Clinical Narrative: CSW confirmed with Claiborne Billings at [facility] that patient can be admitted tomorrow pending a negative COVID test and discharge summary. CSW updated hospitalist. TOC to follow.  Expected Discharge Plan: [place] Barriers to Discharge: Continued Medical Work up  Expected Discharge Plan and Services Expected Discharge Plan: [place] Discharge Planning Services: CM Consult Living arrangements for the past 2 months: Apartment Expected Discharge Date: 04/11/21                Readmission Risk Interventions Readmission Risk Prevention Plan 04/05/2021  Transportation Screening Complete  Medication Review Press photographer) Complete  PCP or Specialist appointment within 3-5 days of discharge Complete  HRI or Home Care Consult Complete  SW Recovery Care/Counseling Consult Complete  [place] Complete  Some recent data might be hidden

## 2021-04-14 NOTE — Consult Note (Signed)
Neurology Consultation  Reason for Consult: Progressive bilateral lower extremity weakness  Referring Physician: Dr. Florene Glen  CC: Progressive bilateral lower extremity weakness  History is obtained from: Patient, Chart review  HPI: Yvette Roberts is a 65 y.o. female with a medical history significant for type 2 diabetes mellitus, obesity, depression, malignant pleural effusion, and stage IV uterine cancer on chemotherapy who was admitted to [facility] from the cancer center for evaluation of progressive weakness, significant weight loss of 50 - 60 pounds over a 3 month period of time with severe odynophagia and candida esophagitis, failure to thrive, and decreased oral intake. An EGD was complete during admission revealing non-severe esophagitis with scattered mild inflammation with erythema in the stomach. Her hospitalization has further been complicated by febrile neutropenia and a pseudomonas UTI s/p antibiotic therapy. During her hospitalization, she has become progressively more weak in her bilateral lower extremities and is unable to ambulate or support her weight with her legs. Cervical, thoracic, and lumbar spine imaging were non-revealing and neurology was consulted for further evaluation.   Of note, [name] was able to ambulate into the hospital with a walker. She states that at home she was able to ambulate slowly but independently. She states that she feels like she has become slightly weaker since hospitalization but denies dizziness or loss of balance with ambulation and denies leg pain affecting her lower extremity function. She currently complains of perineal discomfort that she attributes to the purewick suction catheter in place.   [name]: A complete [name] was performed and is negative except as noted in the HPI.   Past Medical History:  Diagnosis Date   Allergy    Arthritis    Cancer ([place])    COPD (chronic obstructive pulmonary disease) ([place])    Depression    Esophageal reflux 07/21/2014    Frozen shoulder 05/10/2015   Injected 05/10/2015    Obesity, unspecified 07/21/2014   Resistant hypertension 07/21/2014   Type 2 diabetes mellitus without complication ([place]) 04/17/3085   Past Surgical History:  Procedure Laterality Date   BIOPSY  03/29/2021   Procedure: BIOPSY;  Surgeon: Otis Brace, MD;  Location: [facility];  Service: Gastroenterology;;   ESOPHAGOGASTRODUODENOSCOPY N/A 03/29/2021   Procedure: ESOPHAGOGASTRODUODENOSCOPY (EGD);  Surgeon: Otis Brace, MD;  Location: [facility];  Service: Gastroenterology;  Laterality: N/A;   IR IMAGING GUIDED PORT INSERTION  01/16/2021   LAPAROSCOPIC ABDOMINAL EXPLORATION     miscarr     Family History  Problem Relation Age of Onset   Breast cancer Sister        ? age of onset   Breast cancer Maternal Aunt    Breast cancer Maternal Aunt    Breast cancer Maternal Aunt    Social History:   reports that she has never smoked. She has never used smokeless tobacco. No history on file for alcohol use and drug use.  Medications  Current Facility-Administered Medications:    acetaminophen (TYLENOL) tablet 650 mg, 650 mg, Oral, Q6H PRN, 650 mg at 04/10/21 1753 **OR** acetaminophen (TYLENOL) suppository 650 mg, 650 mg, Rectal, Q4H PRN, Lang Snow, FNP   carvedilol (COREG) tablet 12.5 mg, 12.5 mg, Oral, BID WC, Domenic Polite, MD, 12.5 mg at 04/14/21 0851   chlorhexidine (PERIDEX) 0.12 % solution 15 mL, 15 mL, Mouth/Throat, QID, Georgette Shell, MD, 15 mL at 04/14/21 1307   Chlorhexidine Gluconate Cloth 2 % PADS 6 each, 6 each, Topical, Daily, Domenic Polite, MD, 6 each at 04/14/21 1048   enoxaparin (LOVENOX)  injection 40 mg, 40 mg, Subcutaneous, Q24H, Domenic Polite, MD, 40 mg at 04/13/21 2317   feeding supplement (ENSURE ENLIVE / ENSURE PLUS) liquid 237 mL, 237 mL, Oral, TID BM, Sheikh, Omair Latif, DO, 237 mL at 04/13/21 2000   FLUoxetine (PROZAC) 20 MG/5ML solution 20 mg, 20 mg, Oral, Daily, Domenic Polite, MD, 20 mg  at 04/13/21 1014   gabapentin (NEURONTIN) capsule 200 mg, 200 mg, Oral, BID, Domenic Polite, MD, 200 mg at 04/14/21 1047   Gerhardt's butt cream, , Topical, BID, Georgette Shell, MD, Given at 04/14/21 1049   lip balm (CARMEX) ointment 1 application, 1 application, Topical, PRN, Vashti Hey, MD   magic mouthwash w/lidocaine, 2 mL, Oral, TID, Domenic Polite, MD, 2 mL at 04/14/21 1048   magnesium oxide (MAG-OX) tablet 800 mg, 800 mg, Oral, Daily, Georgette Shell, MD, 800 mg at 04/14/21 1047   MEDLINE mouth rinse, 15 mL, Mouth Rinse, q12n4p, Domenic Polite, MD, 15 mL at 04/13/21 1733   melatonin tablet 10 mg, 10 mg, Oral, QHS, Domenic Polite, MD, 10 mg at 04/13/21 2315   mirtazapine (REMERON SOL-TAB) disintegrating tablet 15 mg, 15 mg, Oral, QHS, Domenic Polite, MD, 15 mg at 04/13/21 2317   multivitamin with minerals tablet 1 tablet, 1 tablet, Oral, Daily, Sheikh, Omair Latif, DO, 1 tablet at 04/14/21 1047   naloxone (NARCAN) injection 0.4 mg, 0.4 mg, Intravenous, PRN, [place] Crane, Carole N, DO   nutrition supplement (JUVEN) (JUVEN) powder packet 1 packet, 1 packet, Oral, BID WC, Georgette Shell, MD, 1 packet at 04/12/21 1649   ondansetron (ZOFRAN) tablet 4 mg, 4 mg, Oral, Q6H PRN, 4 mg at 04/05/21 1817 **OR** ondansetron (ZOFRAN) injection 4 mg, 4 mg, Intravenous, Q6H PRN, Domenic Polite, MD, 4 mg at 04/05/21 4982   oxyCODONE (Oxy IR/ROXICODONE) immediate release tablet 10 mg, 10 mg, Oral, Q6H PRN, Georgette Shell, MD, 10 mg at 04/13/21 0523   pantoprazole sodium (PROTONIX) 40 mg/20 mL oral suspension 40 mg, 40 mg, Oral, BID, Arlyn Dunning M, RPH, 40 mg at 04/14/21 1048   phenol (CHLORASEPTIC) mouth spray 1 spray, 1 spray, Mouth/Throat, PRN, Domenic Polite, MD, 1 spray at 03/30/21 0800   polyethylene glycol (MIRALAX / GLYCOLAX) packet 17 g, 17 g, Oral, Daily, Domenic Polite, MD, 17 g at 04/13/21 1014   potassium chloride SA (KLOR-CON) CR tablet 40 mEq, 40 mEq, Oral,  Daily, Georgette Shell, MD, 40 mEq at 04/14/21 1047   protein supplement (RESOURCE BENEPROTEIN) powder packet 6 g, 1 Scoop, Oral, TID WC, Sheikh, Omair Latif, DO, 6 g at 04/13/21 0750   sucralfate (CARAFATE) tablet 1 g, 1 g, Oral, TID WC & HS, Brahmbhatt, Parag, MD, 1 g at 04/14/21 1307  Exam: Current vital signs: BP 120/85 (BP Location: Right Arm)   Pulse (!) 102   Temp (!) 97.3 F (36.3 C)   Resp 18   Ht 5\' 6"  (1.676 m)   SpO2 98%   BMI 27.37 kg/m  Vital signs in last 24 hours: Temp:  [97.3 F (36.3 C)-98.5 F (36.9 C)] 97.3 F (36.3 C) (06/18 1322) Pulse Rate:  [99-105] 102 (06/18 1322) Resp:  [17-20] 18 (06/18 1322) BP: (120-145)/(85-103) 120/85 (06/18 1322) SpO2:  [89 %-98 %] 98 % (06/18 1322)  GENERAL: Awake, alert, sitting in bed watching television, in no acute distress Psych: Affect appropriate for situation, calm and cooperative with examination Head: Normocephalic and atraumatic EENT: Normal conjunctivae, no OP obstruction LUNGS: Normal respiratory effort on room air. Non-labored  breathing CV: Tachycardia on cardiac monitor, extremities without edema ABDOMEN: Soft, non-distended Ext: warm, well perfused, without obvious abnormality  NEURO:  Mental Status: Awake, alert, and oriented to self, age, year, place, and situation. Incorrectly states that the month is May.  OT at bedside reports perseveration certain topics and confusion but examiner did not find evidence of confusion during assessment. Speech/Language: speech is intact without dysarthria or aphasia.   No neglect noted.  Cranial Nerves:  II: PERRL 4 mm/brisk. Visual fields full.  III, IV, VI: EOMI without ptosis V: Sensation is intact to light touch and symmetrical to face. VII: Face is symmetric resting and smiling.  VIII: Hearing is intact to voice IX, X: Palate elevation is symmetric. Phonation normal.  XI: Shoulder shrug is symmetric XII: Tongue protrudes midline without fasciculations.    Motor: Bilateral upper extremities with antigravity movement and able to sustain antigravity movement without vertical drift. Biceps and triceps 3/5 bilaterally. Bilateral hands with contractures and 2/5 grip strength. Very limited dexterity of bilateral hands. Bilateral lower extremities with profound weakness- 2/5 strength throughout with some attempt at antigravity movement but immediate drift to bed with straight leg raise without asymmetry noted. Plantar and dorsiflexion 1/5 bilaterally with bilateral foot drop.  Tone is decreased. Bulk is normal.  Sensation: Intact to light touch and temperature bilaterally in all four extremities. Cerebellar: Mild BUE ataxia which appears disproportionate to her weakness.  DTRs: Areflexic throughout Gait: Unable to assess due to profound weakness  Labs I have reviewed labs in epic and the results pertinent to this consultation are: CBC    Component Value Date/Time   WBC 3.4 (L) 04/14/2021 0614   RBC 3.25 (L) 04/14/2021 0614   HGB 9.4 (L) 04/14/2021 0614   HGB 11.1 (L) 03/22/2021 1059   HCT 28.7 (L) 04/14/2021 0614   PLT 211 04/14/2021 0614   PLT 153 03/22/2021 1059   MCV 88.3 04/14/2021 0614   MCH 28.9 04/14/2021 0614   MCHC 32.8 04/14/2021 0614   RDW 18.1 (H) 04/14/2021 0614   LYMPHSABS 1.3 04/14/2021 0614   MONOABS 0.5 04/14/2021 0614   EOSABS 0.0 04/14/2021 0614   BASOSABS 0.0 04/14/2021 0614   CMP     Component Value Date/Time   NA 137 04/14/2021 0614   K 3.3 (L) 04/14/2021 0614   CL 99 04/14/2021 0614   CO2 29 04/14/2021 0614   GLUCOSE 102 (H) 04/14/2021 0614   BUN 8 04/14/2021 0614   CREATININE 0.34 (L) 04/14/2021 0614   CREATININE 0.71 03/22/2021 1059   CALCIUM 9.3 04/14/2021 0614   PROT 5.9 (L) 04/14/2021 0614   ALBUMIN 2.8 (L) 04/14/2021 0614   AST 23 04/14/2021 0614   AST 33 03/22/2021 1059   ALT 23 04/14/2021 0614   ALT 34 03/22/2021 1059   ALKPHOS 51 04/14/2021 0614   BILITOT 0.7 04/14/2021 0614   BILITOT 0.5  03/22/2021 1059   GFRNONAA >60 04/14/2021 0614   GFRNONAA >60 03/22/2021 1059   Lipid Panel     Component Value Date/Time   CHOL 168 12/10/2018 0826   TRIG 64.0 12/10/2018 0826   HDL 56.60 12/10/2018 0826   CHOLHDL 3 12/10/2018 0826   VLDL 12.8 12/10/2018 0826   LDLCALC 98 12/10/2018 0826   Lab Results  Component Value Date   VITAMINB12 1,457 (H) 04/12/2021   Lab Results  Component Value Date   TSH 0.601 03/01/2021   Results for orders placed or performed during the hospital encounter of 03/27/21  SARS CORONAVIRUS  2 (TAT 6-24 HRS) Nasopharyngeal Nasopharyngeal Swab     Status: None   Collection Time: 03/28/21  5:20 AM   Specimen: Nasopharyngeal Swab  Result Value Ref Range Status   SARS Coronavirus 2 NEGATIVE NEGATIVE Final    Comment: (NOTE) SARS-CoV-2 target nucleic acids are NOT DETECTED.  The SARS-CoV-2 RNA is generally detectable in upper and lower respiratory specimens during the acute phase of infection. Negative results do not preclude SARS-CoV-2 infection, do not rule out co-infections with other pathogens, and should not be used as the sole basis for treatment or other patient management decisions. Negative results must be combined with clinical observations, patient history, and epidemiological information. The expected result is Negative.  Fact Sheet for Patients: SugarRoll.be  Fact Sheet for Healthcare Providers: https://www.woods-mathews.com/  This test is not yet approved or cleared by the [place] FDA and  has been authorized for detection and/or diagnosis of SARS-CoV-2 by FDA under an Emergency Use Authorization (EUA). This EUA will remain  in effect (meaning this test can be used) for the duration of the COVID-19 declaration under Se ction 564(b)(1) of the Act, 21 U.S.C. section 360bbb-3(b)(1), unless the authorization is terminated or revoked sooner.  Performed at [facility], [place] [address], [place], [place] [address]   Culture, Urine     Status: Abnormal   Collection Time: 03/30/21 11:55 AM   Specimen: Urine, Clean Catch  Result Value Ref Range Status   Specimen Description   Final    URINE, CLEAN CATCH Performed at [facility], [place] [address], [place],  [address]    Special Requests   Final    NONE Performed at [facility], [place] [address] [place], [place] [address]    Culture 70,000 COLONIES/mL PSEUDOMONAS AERUGINOSA (A)  Final   Report Status 04/01/2021 FINAL  Final   Organism ID, Bacteria PSEUDOMONAS AERUGINOSA (A)  Final      Susceptibility   Pseudomonas aeruginosa - MIC*    CEFTAZIDIME 2 SENSITIVE Sensitive     CIPROFLOXACIN <=0.25 SENSITIVE Sensitive     GENTAMICIN 2 SENSITIVE Sensitive     IMIPENEM 2 SENSITIVE Sensitive     PIP/TAZO <=4 SENSITIVE Sensitive     CEFEPIME 2 SENSITIVE Sensitive     * 70,000 COLONIES/mL PSEUDOMONAS AERUGINOSA  Culture, Urine     Status: Abnormal   Collection Time: 04/01/21  8:56 AM   Specimen: Urine, Random  Result Value Ref Range Status   Specimen Description   Final    URINE, RANDOM Performed at [facility], [place] [address] [place],  [address]    Special Requests   Final    NONE Performed at [facility], [place] [address] [place], [place] [address]    Culture >=100,000 COLONIES/mL PSEUDOMONAS AERUGINOSA (A)  Final   Report Status 04/03/2021 FINAL  Final   Organism ID, Bacteria PSEUDOMONAS AERUGINOSA (A)  Final      Susceptibility   Pseudomonas aeruginosa - MIC*    CEFTAZIDIME 2 SENSITIVE Sensitive     CIPROFLOXACIN <=0.25 SENSITIVE Sensitive     GENTAMICIN 2 SENSITIVE Sensitive     IMIPENEM 2 SENSITIVE Sensitive     PIP/TAZO 8 SENSITIVE Sensitive     CEFEPIME 2 SENSITIVE Sensitive     * >=100,000 COLONIES/mL PSEUDOMONAS AERUGINOSA  Culture, blood (routine x 2)     Status: None   Collection Time: 04/01/21  9:16 AM    Specimen: BLOOD  Result Value  Ref Range Status   Specimen Description   Final    BLOOD BLOOD RIGHT HAND Performed at [place] [address], [place], [place] [address]    Special Requests   Final    BOTTLES DRAWN AEROBIC AND ANAEROBIC Blood Culture adequate volume Performed at [place] [address], [place], [place] [address]    Culture   Final    NO GROWTH 5 DAYS Performed at [facility], [place] [address] [place], [place] [address]    Report Status 04/06/2021 FINAL  Final  Culture, blood (routine x 2)     Status: None   Collection Time: 04/01/21 10:38 AM   Specimen: BLOOD  Result Value Ref Range Status   Specimen Description   Final    BLOOD LEFT HAND Performed at [place] [address], [place], [place] [address]    Special Requests   Final    BOTTLES DRAWN AEROBIC ONLY Blood Culture results may not be optimal due to an inadequate volume of blood received in culture bottles Performed at [place] [address] [place], [place] [address]    Culture   Final    NO GROWTH 5 DAYS Performed at [facility], [place] [address] [place], [place] [address]    Report Status 04/06/2021 FINAL  Final  SARS CORONAVIRUS 2 (TAT 6-24 HRS) Nasopharyngeal Nasopharyngeal Swab     Status: None   Collection Time: 04/09/21  2:12 PM   Specimen: Nasopharyngeal Swab  Result Value Ref Range Status   SARS Coronavirus 2 NEGATIVE NEGATIVE Final    Comment: (NOTE) SARS-CoV-2 target nucleic acids are NOT DETECTED.  The SARS-CoV-2 RNA is generally detectable in upper and lower respiratory specimens during the acute phase of infection. Negative results do not preclude SARS-CoV-2 infection, do not rule out co-infections with other pathogens, and should not be used as the sole basis for treatment or other patient management decisions. Negative results must be combined with clinical  observations, patient history, and epidemiological information. The expected result is Negative.  Fact Sheet for Patients: SugarRoll.be  Fact Sheet for Healthcare Providers: https://www.woods-mathews.com/  This test is not yet approved or cleared by the [place] FDA and  has been authorized for detection and/or diagnosis of SARS-CoV-2 by FDA under an Emergency Use Authorization (EUA). This EUA will remain  in effect (meaning this test can be used) for the duration of the COVID-19 declaration under Se ction 564(b)(1) of the Act, 21 U.S.C. section 360bbb-3(b)(1), unless the authorization is terminated or revoked sooner.  Performed at [facility], [place] [address], [place], [place] [address]    Imaging I have reviewed the images obtained:  MRI Lumbar Spine [facility] Contrast 6/15: No evidence of regional metastatic disease. Small focus of edema and enhancement at the inferior endplate of J28 favored to be discogenic in nature. Even if this did represent a small bone metastasis, it would not relate to the clinical presentation. Multilevel degenerative disc disease and degenerative facet disease throughout the lumbar region as above. Some potential for neural compression as outlined at each level above, but I would not expect these abnormalities to present with an inability to move the lower extremities.  MRI Thoracic Spine [facility] Contrast 6/15: 1. No acute abnormality of the cervical or thoracic spine. 2. Mild cervical degenerative disc disease without spinal canal stenosis. 3. Mild right C3-4 and right T10-11 neural foraminal stenosis. 4. Loculated fluid collection  in the posterior right mid chest, as previously seen on PET-CT.  MRI Brain [facility] Contrast 6/17: 1. No acute intracranial abnormality. 2. Chronic small vessel disease and generalized volume loss.  MRI Cervical Spine [facility] Contrast 6/17: 1. No acute abnormality of the cervical or  thoracic spine. 2. Mild cervical degenerative disc disease without spinal canal stenosis. 3. Mild right C3-4 and right T10-11 neural foraminal stenosis. 4. Loculated fluid collection in the posterior right mid chest, as previously seen on PET-CT.  Assessment: 65 y.o. female with stage IV uterine cancer on chemotherapy, DM2, HTN, and malignant pleural effusion who was admitted for evaluation of FTT, decreased PO intake, significant weight loss, and severe odynophagia. During her hospitalization, she has become significantly weak in her lower extremities and is unable to ambulate despite being able to walk into the hospital with a walker when she was admitted 03/13/2021. - Examination reveals patient with significant generalized weakness with lower extremity weakness more profound than upper extremity weakness and with bilateral foot drop on assessment. There is no asymmetry noted on examination.  - Brain and spinal MR imaging were obtained without revealing an etiology for profound lower extremity weakness and rapidly progressive loss of function.  - DDx includes paraneoplastic polyradiculoneuropathy 2/2 known metastatic cancer, thiamine deficiency 2/2 gastric inflammation with possible malabsorption and decreased oral intake, chronic inflammatory demyelinating polyradiculoneuropathy (CIDP) given her diffuse areflexia, critical illness neuropathy. Although deconditioning may be playing a role, it does not account for the profound weakness and areflexia.     Recommendations: - Serum paraneoplastic panel (Ordered as a [facility] send out)  - Thiamine level pending, agree with thiamine supplementation - Continue PT/OT as you are  - EMG/NCS may provide further information on an outpatient basis  - Will need LP under fluoro for the following labs: cell count with differential, protein, glucose, cytology (need 10 mL), oligoclonal bands, IgG index, VDRL (ordered). Will need to wait until Monday as cytology cannot  be performed on the weekend and the sample will degrade if not processed immediately. - Will need to be off Lovenox for the LP. Please switch Lovenox to SCD's on Sunday morning.   Pt seen by NP/Neuro .  Anibal Henderson, AGAC-NP [facility] Pager: [phone]  I have seen and examined the patient. I have formulated the assessment and recommendations. 65 year old female with severe diffuse limb weakness and absent reflexes. DDx includes paraneoplastic polyradiculoneuropathy 2/2 known metastatic cancer, thiamine deficiency 2/2 gastric inflammation with possible malabsorption and decreased oral intake, chronic inflammatory demyelinating polyradiculoneuropathy (CIDP) given her diffuse areflexia, critical illness neuropathy. Although deconditioning may be playing a role, it does not account for the profound weakness and areflexia. Will need LP under fluoro to assess for albuminocytologic dissociation and cytology.  Electronically signed: Dr. Kerney Elbe

## 2021-04-14 NOTE — Progress Notes (Signed)
PROGRESS NOTE    Yvette Roberts  CZY:[id number] DOB: [birth year] DOA: 03/27/2021 PCP: [name], FNP   No chief complaint on file.  Brief Narrative:  65 year old African-American female with Kaetlyn Noa past medical history metastatic uterine cancer, type 2 diabetes, hypertension malignant pleural effusion admitted with odynophagia and weight loss.  She reports losing 50 to 60 pounds in the last 3 months.  She underwent further work-up and had an EGD and found nonsevere esophagitis in the proximal and distal esophagus as well as scattered mild inflammation with erythema in the entire stomach.  She's been started on protonix and sucralfate.  Hospitalization complicated by febrile neutropenia and pseudomonas UTI, now s/p abx.   Assessment & Plan:   Principal Problem:   Esophagitis Active Problems:   Type 2 diabetes mellitus without complication (HCC)   Hypertension   Uterine cancer (HCC)   Malignant pleural effusion   Protein-calorie malnutrition, severe (HCC)   Odynophagia   Decreased urine output   Pressure injury of skin  Bilateral Lower Extremity Weakness Sister in law notes weakness worse since being in hospital (says she walked into hospital with walker) - therapy notes significantly weaker as well.  Weaker grip strength/upper extremities noted as well.  Unable to fully extend 3-5th fingers.     ? Critical myopathy, per discussion with oncology - B12 wnl Follow thiamine - give high dose thiamine  MRI L spine -> no regional metastatic disease, small focus of edema and enhancement at the inferior endplate of U93 favored to be discogenic in nature.  Multilevel degenerative disc disease and degenerative facet disease throughout the lumbar region.   MRI brain, C, T spine without acute abnormality (see reports) Neurology c/s, greatly appreciate their assistance If unrevealing, would follow outpatient EMG/NCS  Odynophagia and Esophagitis  Gastritis GERD -GI was consulted and  underwent EGD on 03/29/21 which showed mild nonsevere esophagitis in the proximal and distal esophagus as well as scattered mild inflammation with erythema in the entire stomach but she had profound symptoms of Esophagitis -Her Bx -biopsy from the stomach shows reactive gastropathy and negative GI H. pylori.  No metaplasia dysplasia or malignancy.  Biopsy from the esophagus shows mild acute esophagitis negative for fungus.  No metaplasia or dysplasia or malignancy. Continue oxycodone 10 mg q6 prn for pain -GI recommending continuing Pantoprazole BID and Carafate  1 gram po TIDwm and qHS   -Patient to follow up with GI in the outpatient setting   Febrile Neutropenia-no further fevers she finished Latica Hohmann course of cefepime for 7 days.  Cefepime was stopped on 04/07/2021.    Pancytopenia- Hb relatively stable today --- s/p transfusion 04/09/2021.     Pseudomonas Aeruginosa UTI finished Errica Dutil course of cefepime.    Hypokalemia  Follow outpatient    Hypomagnesemia  Follow outpatient    Hypophosphatemia Follow outpatient   Refeeding syndrome monitor electrolytes  intermittently outpatient    Severe Protein Calorie Malnutrition -Nutritionist was consulted -she has just started to eat will need to monitor for refeeding syndrome.     Metastatic Uterine Cancer-followed by oncology holding off on further chemo at this time.  Oncology signed off. Oncology planning to follow outpatient in about 1 month   Malignant Pleural Effusion -CT Scan showed "Loculated RIGHT-sided pleural fluid with 6.5 x 3.6 cm axial dimension as compared to 8.1 x 3.9 cm. Basilar volume loss and signs of pleural thickening with decreased pleural thickening, markedly diminished since the prior PET and with continued decreased pleural thickening particularly  in the inferior RIGHT chest even since the recent CT of the chest. Over the RIGHT hemidiaphragm pleural thickness approximately 7 mm as compared to 12 mm greatest thickness. LEFT chest  is clear. Airways are patent." with the impression being "Decreased loculated pleural fluid and pleural thickening in the chest follows diminishing perihepatic soft tissue and capsular hepatic implants as described" -No Respiratory Distress Follow outpatient with oncology   Hypertension Continue coreg   Diabetes Mellitus Type 2 Follow outpatient A1c 6.1   Anxiety and Depression -C/w Mirtazapine 50 mg p.o. nightly and Fluoxetine 20 mg p.o. daily  Pressure Ulcer Pressure Injury 04/05/21 Sacrum Posterior;Medial;Lower Stage 2 -  Partial thickness loss of dermis presenting as Mars Scheaffer shallow open injury with Aira Sallade red, pink wound bed without slough. Appears to be stage two, bleeding right inside of buttocks and upper part of (Active)  04/05/21 2030  Location: Sacrum  Location Orientation: Posterior;Medial;Lower  Staging: Stage 2 -  Partial thickness loss of dermis presenting as Tasfia Vasseur shallow open injury with Aster Eckrich red, pink wound bed without slough.  Wound Description (Comments): Appears to be stage two, bleeding right inside of buttocks and upper part of bottom  Present on Admission: No   DVT prophylaxis:lovenox Code Status: full  Family Communication: sister in law Disposition:   Status is: Inpatient  Remains inpatient appropriate because:Inpatient level of care appropriate due to severity of illness  Dispo: The patient is from: Home              Anticipated d/c is to: Home              Patient currently is not medically stable to d/c.   Difficult to place patient No       Consultants:  Oncology GI  Procedures:  EGD - Non-severe esophagitis. Biopsied. - Gastritis. Biopsied. - Normal duodenal bulb, first portion of the duodenum and second portion of the duodenum.    Antimicrobials: Anti-infectives (From admission, onward)    Start     Dose/Rate Route Frequency Ordered Stop   04/01/21 0930  ceFEPIme (MAXIPIME) 2 g in sodium chloride 0.9 % 100 mL IVPB  Status:  Discontinued        2  g 200 mL/hr over 30 Minutes Intravenous Every 8 hours 04/01/21 0839 04/07/21 1345   03/27/21 1800  fluconazole (DIFLUCAN) IVPB 200 mg  Status:  Discontinued        200 mg 100 mL/hr over 60 Minutes Intravenous Daily 03/27/21 1734 03/29/21 1633          Subjective: Eager to go home  Objective: Vitals:   04/13/21 1329 04/13/21 2054 04/14/21 0526 04/14/21 1322  BP: (!) 146/91 (!) 136/97 (!) 145/103 120/85  Pulse: 96 99 (!) 105 (!) 102  Resp: 20 17 20 18   Temp: 98.9 F (37.2 C) 98 F (36.7 C) 98.5 F (36.9 C) (!) 97.3 F (36.3 C)  TempSrc: Oral  Oral   SpO2: 97% 93% (!) 89% 98%  Height:        Intake/Output Summary (Last 24 hours) at 04/14/2021 1529 Last data filed at 04/14/2021 1300 Gross per 24 hour  Intake 353 ml  Output 100 ml  Net 253 ml   There were no vitals filed for this visit.  Examination:  General: No acute distress. Cardiovascular: Heart sounds show Karalyne Nusser regular rate, and rhythm.  Lungs: Clear to auscultation bilaterally  Abdomen: Soft, nontender, nondistended  Neurological: Alert and oriented 3. Bilateral lower extremity weakness, foot drop - weakness to  upper extremities grips, less than lowers. Cranial nerves II through XII grossly intact. Skin: Warm and dry. No rashes or lesions. Extremities: No clubbing or cyanosis. No edema.   Data Reviewed: I have personally reviewed following labs and imaging studies  CBC: Recent Labs  Lab 04/10/21 0602 04/11/21 0549 04/12/21 0530 04/13/21 0522 04/14/21 0614  WBC 3.5* 4.1 3.8* 3.9* 3.4*  NEUTROABS 1.9 2.4 2.0 2.3 1.6*  HGB 8.7* 9.1* 9.1* 9.3* 9.4*  HCT 26.2* 27.5* 27.2* 27.7* 28.7*  MCV 86.5 86.8 86.9 87.1 88.3  PLT 87* 116* 145* 182 193    Basic Metabolic Panel: Recent Labs  Lab 04/08/21 0523 04/09/21 0514 04/10/21 0602 04/11/21 2157 04/12/21 0530 04/12/21 0537 04/13/21 0522 04/14/21 0614 04/14/21 1136  NA 135 137  --   --  136  --  138 137  --   K 3.0* 3.3*  --   --  3.7  --  3.5 3.3*  --    CL 103 104  --   --  100  --  100 99  --   CO2 26 27  --   --  27  --  28 29  --   GLUCOSE 120* 101*  --   --  104*  --  112* 102*  --   BUN 5* <5*  --   --  6*  --  6* 8  --   CREATININE 0.33* 0.39* 0.35*  --  0.38*  --  0.35* 0.34*  --   CALCIUM 8.0* 8.2*  --   --  9.2  --  9.2 9.3  --   MG 1.5* 1.6*  --   --  1.2*  --  1.7 1.4*  --   PHOS 2.8 2.2*  --  3.1  --  3.1  --   --  3.6    GFR: CrCl cannot be calculated (Unknown ideal weight.).  Liver Function Tests: Recent Labs  Lab 04/12/21 0530 04/13/21 0522 04/14/21 0614  AST 20 21 23   ALT 22 22 23   ALKPHOS 52 53 51  BILITOT 0.7 0.7 0.7  PROT 5.7* 6.0* 5.9*  ALBUMIN 2.6* 2.6* 2.8*    CBG: Recent Labs  Lab 04/13/21 0708 04/13/21 1153 04/13/21 1625 04/14/21 0728 04/14/21 1204  GLUCAP 116* 118* 108* 96 100*     Recent Results (from the past 240 hour(s))  SARS CORONAVIRUS 2 (TAT 6-24 HRS) Nasopharyngeal Nasopharyngeal Swab     Status: None   Collection Time: 04/09/21  2:12 PM   Specimen: Nasopharyngeal Swab  Result Value Ref Range Status   SARS Coronavirus 2 NEGATIVE NEGATIVE Final    Comment: (NOTE) SARS-CoV-2 target nucleic acids are NOT DETECTED.  The SARS-CoV-2 RNA is generally detectable in upper and lower respiratory specimens during the acute phase of infection. Negative results do not preclude SARS-CoV-2 infection, do not rule out co-infections with other pathogens, and should not be used as the sole basis for treatment or other patient management decisions. Negative results must be combined with clinical observations, patient history, and epidemiological information. The expected result is Negative.  Fact Sheet for Patients: SugarRoll.be  Fact Sheet for Healthcare Providers: https://www.woods-mathews.com/  This test is not yet approved or cleared by the [place] FDA and  has been authorized for detection and/or diagnosis of SARS-CoV-2 by FDA under an  Emergency Use Authorization (EUA). This EUA will remain  in effect (meaning this test can be used) for the duration of the COVID-19 declaration under Se ction  564(b)(1) of the Act, 21 U.S.C. section 360bbb-3(b)(1), unless the authorization is terminated or revoked sooner.  Performed at [facility], [place] [address] [place], [place] [address]          Radiology Studies: MR BRAIN W WO CONTRAST  Result Date: 04/13/2021 CLINICAL DATA:  bilateral lower extremity weakness. EXAM: MRI HEAD WITHOUT AND WITH CONTRAST TECHNIQUE: Multiplanar, multiecho pulse sequences of the brain and surrounding structures were obtained without and with intravenous contrast. CONTRAST:  7.89mL GADAVIST GADOBUTROL 1 MMOL/ML IV SOLN COMPARISON:  None. FINDINGS: Brain: No acute infarct, mass effect or extra-axial collection. No acute or chronic hemorrhage. There is multifocal hyperintense T2-weighted signal within the white matter. Generalized volume loss without Elvy Mclarty clear lobar predilection. Hyperintense T2-weighted signal within the brainstem is likely due to chronic small vessel disease. The midline structures are normal. There is no abnormal contrast enhancement. Vascular: Major flow voids are preserved. Skull and upper cervical spine: Normal calvarium and skull base. Visualized upper cervical spine and soft tissues are normal. Sinuses/Orbits:No paranasal sinus fluid levels or advanced mucosal thickening. No mastoid or middle ear effusion. Normal orbits. IMPRESSION: 1. No acute intracranial abnormality. 2. Chronic small vessel disease and generalized volume loss. Electronically Signed   By: Ulyses Jarred M.D.   On: 04/13/2021 23:43   MR CERVICAL SPINE W WO CONTRAST  Result Date: 04/13/2021 CLINICAL DATA:  Bilateral lower extremity weakness EXAM: MRI CERVICAL AND THORACIC SPINE WITHOUT AND WITH CONTRAST TECHNIQUE: Multiplanar and multiecho pulse sequences of the cervical spine, to include the craniocervical junction and  cervicothoracic junction, and the thoracic spine, were obtained without and with intravenous contrast. CONTRAST:  7.23mL GADAVIST GADOBUTROL 1 MMOL/ML IV SOLN COMPARISON:  None. FINDINGS: MRI CERVICAL SPINE FINDINGS Alignment: Physiologic. Vertebrae: No fracture, evidence of discitis, or bone lesion. Cord: Normal signal and morphology. No abnormal contrast enhancement. Posterior Fossa, vertebral arteries, paraspinal tissues: Negative. Disc levels: C1-2: Unremarkable. C2-3: Normal disc space and facet joints. There is no spinal canal stenosis. No neural foraminal stenosis. C3-4: Small right asymmetric disc bulge with uncovertebral hypertrophy. There is no spinal canal stenosis. Mild right neural foraminal stenosis. C4-5: Small disc bulge. There is no spinal canal stenosis. No neural foraminal stenosis. C5-6: Normal disc space and facet joints. There is no spinal canal stenosis. No neural foraminal stenosis. C6-7: Normal disc space and facet joints. There is no spinal canal stenosis. No neural foraminal stenosis. C7-T1: Normal disc space and facet joints. There is no spinal canal stenosis. No neural foraminal stenosis. MRI THORACIC SPINE FINDINGS Alignment:  Physiologic. Vertebrae: No fracture, evidence of discitis, or bone lesion. Cord: Normal signal and morphology. No abnormal contrast enhancement. Paraspinal and other soft tissues: Loculated fluid collection in the posterior right mid chest. Disc levels: T4-5: Small central disc protrusion without stenosis. T5-6: Small central disc protrusion without stenosis. T6-7: Small central disc protrusion effacing the ventral thecal sac. No spinal canal stenosis. T7-8: Minimal disc bulge without stenosis. T10-11: Small right subarticular disc protrusion with mild right foraminal narrowing. No spinal canal stenosis. The other thoracic levels are unremarkable. IMPRESSION: 1. No acute abnormality of the cervical or thoracic spine. 2. Mild cervical degenerative disc disease  without spinal canal stenosis. 3. Mild right C3-4 and right T10-11 neural foraminal stenosis. 4. Loculated fluid collection in the posterior right mid chest, as previously seen on PET-CT. Electronically Signed   By: Ulyses Jarred M.D.   On: 04/13/2021 23:54   MR THORACIC SPINE W WO CONTRAST  Result Date:  04/13/2021 CLINICAL DATA:  Bilateral lower extremity weakness EXAM: MRI CERVICAL AND THORACIC SPINE WITHOUT AND WITH CONTRAST TECHNIQUE: Multiplanar and multiecho pulse sequences of the cervical spine, to include the craniocervical junction and cervicothoracic junction, and the thoracic spine, were obtained without and with intravenous contrast. CONTRAST:  7.66mL GADAVIST GADOBUTROL 1 MMOL/ML IV SOLN COMPARISON:  None. FINDINGS: MRI CERVICAL SPINE FINDINGS Alignment: Physiologic. Vertebrae: No fracture, evidence of discitis, or bone lesion. Cord: Normal signal and morphology. No abnormal contrast enhancement. Posterior Fossa, vertebral arteries, paraspinal tissues: Negative. Disc levels: C1-2: Unremarkable. C2-3: Normal disc space and facet joints. There is no spinal canal stenosis. No neural foraminal stenosis. C3-4: Small right asymmetric disc bulge with uncovertebral hypertrophy. There is no spinal canal stenosis. Mild right neural foraminal stenosis. C4-5: Small disc bulge. There is no spinal canal stenosis. No neural foraminal stenosis. C5-6: Normal disc space and facet joints. There is no spinal canal stenosis. No neural foraminal stenosis. C6-7: Normal disc space and facet joints. There is no spinal canal stenosis. No neural foraminal stenosis. C7-T1: Normal disc space and facet joints. There is no spinal canal stenosis. No neural foraminal stenosis. MRI THORACIC SPINE FINDINGS Alignment:  Physiologic. Vertebrae: No fracture, evidence of discitis, or bone lesion. Cord: Normal signal and morphology. No abnormal contrast enhancement. Paraspinal and other soft tissues: Loculated fluid collection in the  posterior right mid chest. Disc levels: T4-5: Small central disc protrusion without stenosis. T5-6: Small central disc protrusion without stenosis. T6-7: Small central disc protrusion effacing the ventral thecal sac. No spinal canal stenosis. T7-8: Minimal disc bulge without stenosis. T10-11: Small right subarticular disc protrusion with mild right foraminal narrowing. No spinal canal stenosis. The other thoracic levels are unremarkable. IMPRESSION: 1. No acute abnormality of the cervical or thoracic spine. 2. Mild cervical degenerative disc disease without spinal canal stenosis. 3. Mild right C3-4 and right T10-11 neural foraminal stenosis. 4. Loculated fluid collection in the posterior right mid chest, as previously seen on PET-CT. Electronically Signed   By: Ulyses Jarred M.D.   On: 04/13/2021 23:54        Scheduled Meds:  carvedilol  12.5 mg Oral BID WC   chlorhexidine  15 mL Mouth/Throat QID   Chlorhexidine Gluconate Cloth  6 each Topical Daily   enoxaparin (LOVENOX) injection  40 mg Subcutaneous Q24H   feeding supplement  237 mL Oral TID BM   FLUoxetine  20 mg Oral Daily   gabapentin  200 mg Oral BID   Gerhardt's butt cream   Topical BID   magic mouthwash w/lidocaine  2 mL Oral TID   magnesium oxide  800 mg Oral Daily   mouth rinse  15 mL Mouth Rinse q12n4p   melatonin  10 mg Oral QHS   mirtazapine  15 mg Oral QHS   multivitamin with minerals  1 tablet Oral Daily   nutrition supplement (JUVEN)  1 packet Oral BID WC   pantoprazole sodium  40 mg Oral BID   polyethylene glycol  17 g Oral Daily   potassium chloride  40 mEq Oral Daily   protein supplement  1 Scoop Oral TID WC   sucralfate  1 g Oral TID WC & HS   [START ON 04/17/2021] thiamine injection  100 mg Intravenous Daily   Continuous Infusions:  thiamine injection       LOS: 18 days    Time spent: over 30 min    Fayrene Helper, MD [facility]   To contact the attending provider between  7A-7P or the covering  provider during after hours 7P-7A, please log into the web site [email or web address] and access using universal [place] password for that web site. If you do not have the password, please call the hospital operator.  04/14/2021, 3:29 PM

## 2021-04-14 NOTE — Evaluation (Signed)
Occupational Therapy Evaluation Patient Details Name: Yvette Roberts MRN: [id number] DOB: 02-Sep-1956 Today's Date: 04/14/2021    History of Present Illness Yvette Roberts is a 65 year old female presents with severe odynophagia the resultant minimal intake and profound weight loss. EGD on 03/29/21 which showed mild nonsevere esophagitis. PMH: hypertension, COPD, diabetes, stage IV uterine cancer, history of malignant pleural effusion diagnosed in 2/22.   Clinical Impression   Patient is currently requiring assistance with ADLs including total assist with bed level toileting,  with bed level LE dressing, and with bed level bathing, and maximum assist with UE dressing, grooming and eating all at bed level, and all of which is below patient's typical baseline of being Modified independent prior to admission with 1 recent fall reported.   During this evaluation, patient was limited by profound LE weakness and severe UE weakness and numbness as well as impaired proprioception, impaired cognition, poor activity tolerance, poor sitting balance and inability to stand, all of which has the potential to impact patient's and caregiver's safety and independence during functional mobility, as well as performance for ADLs. [place] "6-clicks" Daily Activity Inpatient Short Form score of 9/24 indicates 79.59% ADL impairment this session. Patient lives alone with PRN availability only for supervision and assistance by family.  Patient demonstrates fair rehab potential, and should benefit from continued skilled occupational therapy services while in acute care to maximize safety, independence and quality of life at home.  Continued occupational therapy services in a SNF setting prior to return home is recommended.  ?    Follow Up Recommendations  SNF    Equipment Recommendations   (Will defer to post-acute recommendations)    Recommendations for Other Services       Precautions / Restrictions  Precautions Precautions: Fall Precaution Comments: B LE  paresis (profound weakness) Restrictions Weight Bearing Restrictions: No      Mobility Bed Mobility Overal bed mobility: Needs Assistance Bed Mobility: Supine to Sit;Sit to Supine     Supine to sit: Total assist Sit to supine: Max assist   General bed mobility comments: VC to reach for bedrail, Increased cues for each step by step move to sitting and back to supine. Hand over hand to reinforce verbal cues.    Transfers                 General transfer comment: Unable to test this session. LEs at bed level ~1/5 to 2-/5, did not attempt with assistance of 1 for safety.    Balance Overall balance assessment: History of Falls;Needs assistance Sitting-balance support: Feet supported;Bilateral upper extremity supported Sitting balance-Leahy Scale: Poor         Standing balance comment: Unable to test standing.                           ADL either performed or assessed with clinical judgement   ADL Overall ADL's : Needs assistance/impaired Eating/Feeding: Maximal assistance;Bed level   Grooming: Maximal assistance;Bed level   Upper Body Bathing: Maximal assistance;Bed level   Lower Body Bathing: Total assistance;Bed level   Upper Body Dressing : Maximal assistance;Bed level   Lower Body Dressing: Total assistance;Bed level Lower Body Dressing Details (indicate cue type and reason): Very weak LEs with bil foot drop and facial wince during gentle and incomplete dorsal flexion to RT and LT. Total Assist to flex knees. Unable to bridge.   Toilet Transfer Details (indicate cue type and reason): Pt declined,  and requested return to supine after ~3 min sitting EOB. Unable to determine, but bed level toileting at this time. On pure wick. Toileting- Clothing Manipulation and Hygiene: Bed level;Total assistance;+2 for physical assistance       Functional mobility during ADLs: Maximal assistance General  ADL Comments: Max As bed level.     Vision   Vision Assessment?: No apparent visual deficits Additional Comments: Not formally assessed     Perception     Praxis      Pertinent Vitals/Pain Pain Assessment: No/denies pain (Pt very sensative to cold)     Hand Dominance Right   Extremity/Trunk Assessment Upper Extremity Assessment Upper Extremity Assessment: RUE deficits/detail;LUE deficits/detail RUE Deficits / Details: AAROM WFL: Shoulder /elbow/wrist. Negative composite grip with facial grimace with passive D2 flexion when testing proprioception. Hand presents with D4-D5 MP flexion and PIP/DIP extension while D1-D3 in normal resting postion. Palmer cupping. RUE Sensation: decreased light touch;decreased proprioception RUE Coordination: decreased fine motor;decreased gross motor LUE Deficits / Details: AAROM WFL: Shoulder /elbow/wrist. Negative composite grip  Hand presents with D4-D5 MP flexion and PIP/DIP extension while D1-D3 in normal resting postion. Palmer cupping. LUE Sensation: decreased light touch;decreased proprioception LUE Coordination: decreased fine motor;decreased gross motor   Lower Extremity Assessment Lower Extremity Assessment: Defer to PT evaluation       Communication Communication Communication: Expressive difficulties;Receptive difficulties (See cognition.  Pt with difficulty moving from one topic to next and will stay on previous topic.)   Cognition Arousal/Alertness: Awake/alert Behavior During Therapy: WFL for tasks assessed/performed Overall Cognitive Status: Impaired/Different from baseline Area of Impairment: Memory                 Orientation Level: Disoriented to;Time;Situation   Memory: Decreased short-term memory (States, "I have to pee" intermittently during session. Pure wick expplained each time and pt responds, "Oh yeah that's right" but unable to retain information.) Following Commands: Follows one step commands inconsistently        General Comments: Diffiulcty with certain commands: Ex: stated year when asked current month. Then asked, "What do you want?" When asked to state the current month again, pt responded, "[employer]   General Comments       Exercises Other Exercises Other Exercises: Pt encouraged to move UEs/LEs trunk in bed ad lib. Pt shown examples of simple UE ROM to perform but would not demonstrate back despite encouragement and cues. Pt c/o being cold and wants UEs under blankets.   Shoulder Instructions      Home Living Family/patient expects to be discharged to:: Skilled nursing facility Living Arrangements: Alone Available Help at Discharge: Family;Available PRN/intermittently Type of Home: Apartment       Home Layout: One level     Bathroom Shower/Tub: Tub/shower unit;Walk-in shower         Home Equipment: Walker - 4 wheels;Hand held shower head;Shower seat;Wheelchair - manual;Cane - quad          Prior Functioning/Environment Level of Independence: Independent with assistive device(s)  Gait / Transfers Assistance Needed: Pt reports ambulating with her Quad cane in and outside the home prior to admission. Pt endorses 1 recent fall without significant injury. ADL's / Homemaking Assistance Needed: Pt reports prior Independence with all.            OT Problem List: Decreased strength;Decreased coordination;Impaired sensation;Decreased cognition;Decreased range of motion;Decreased activity tolerance;Decreased safety awareness;Impaired tone;Impaired balance (sitting and/or standing);Impaired UE functional use;Decreased knowledge of use of DME or AE  OT Treatment/Interventions: Self-care/ADL training;Therapeutic exercise;Splinting;Therapeutic activities;Cognitive remediation/compensation;Neuromuscular education;Energy conservation;Patient/family education;DME and/or AE instruction;Balance training;Manual therapy    OT Goals(Current goals can be found in the care  plan section) Acute Rehab OT Goals OT Goal Formulation: Patient unable to participate in goal setting Time For Goal Achievement: 04/28/21 Potential to Achieve Goals: Fair ADL Goals Pt Will Transfer to Toilet: with transfer board;squat pivot transfer;with mod assist (to drop arm BSC or recliner) Pt/caregiver will Perform Home Exercise Program: Increased ROM;Increased strength;Both right and left upper extremity;With minimal assist  OT Frequency: Min 2X/week   Barriers to D/C: Decreased caregiver support  Lives alone       Co-evaluation              AM-PAC OT "6 Clicks" Daily Activity     Outcome Measure Help from another person eating meals?: A Lot Help from another person taking care of personal grooming?: A Lot Help from another person toileting, which includes using toliet, bedpan, or urinal?: Total Help from another person bathing (including washing, rinsing, drying)?: Total Help from another person to put on and taking off regular upper body clothing?: A Lot Help from another person to put on and taking off regular lower body clothing?: Total 6 Click Score: 9   End of Session Nurse Communication: Mobility status  Activity Tolerance: Patient limited by fatigue Patient left: in bed;with call bell/phone within reach;with family/visitor present;with bed alarm set  OT Visit Diagnosis: Hemiplegia and hemiparesis;History of falling (Z91.81);Muscle weakness (generalized) (M62.81);Other symptoms and signs involving cognitive function Hemiplegia - Right/Left:  (global) Hemiplegia - caused by: Unspecified                Time: 1322-1400 OT Time Calculation (min): 38 min Charges:  OT General Charges $OT Visit: 1 Visit OT Evaluation $OT Eval Low Complexity: 1 Low OT Treatments $Self Care/Home Management : 8-22 mins $Therapeutic Activity: 8-22 mins  Anderson [place], [place] Office: [phone] 04/14/2021  Julien Girt 04/14/2021, 2:25 PM

## 2021-04-15 DIAGNOSIS — K209 Esophagitis, unspecified without bleeding: Secondary | ICD-10-CM | POA: Diagnosis not present

## 2021-04-15 LAB — COMPREHENSIVE METABOLIC PANEL
ALT: 22 U/L (ref 0–44)
AST: 21 U/L (ref 15–41)
Albumin: 2.6 g/dL — ABNORMAL LOW (ref 3.5–5.0)
Alkaline Phosphatase: 46 U/L (ref 38–126)
Anion gap: 6 (ref 5–15)
BUN: 8 mg/dL (ref 8–23)
CO2: 28 mmol/L (ref 22–32)
Calcium: 9.1 mg/dL (ref 8.9–10.3)
Chloride: 103 mmol/L (ref 98–111)
Creatinine, Ser: 0.4 mg/dL — ABNORMAL LOW (ref 0.44–1.00)
GFR, Estimated: >60 mL/min (ref >60)
Glucose, Bld: 102 mg/dL — ABNORMAL HIGH (ref 70–99)
Potassium: 3.8 mmol/L (ref 3.5–5.1)
Sodium: 137 mmol/L (ref 135–145)
Total Bilirubin: 0.7 mg/dL (ref 0.3–1.2)
Total Protein: 5.7 g/dL — ABNORMAL LOW (ref 6.5–8.1)

## 2021-04-15 LAB — CBC WITH DIFFERENTIAL/PLATELET
Abs Immature Granulocytes: 0.01 10*3/uL (ref 0.00–0.07)
Basophils Absolute: 0 10*3/uL (ref 0.0–0.1)
Basophils Relative: 0 %
Eosinophils Absolute: 0 10*3/uL (ref 0.0–0.5)
Eosinophils Relative: 1 %
HCT: 27 % — ABNORMAL LOW (ref 36.0–46.0)
Hemoglobin: 8.8 g/dL — ABNORMAL LOW (ref 12.0–15.0)
Immature Granulocytes: 0 %
Lymphocytes Relative: 42 %
Lymphs Abs: 1.4 10*3/uL (ref 0.7–4.0)
MCH: 29 pg (ref 26.0–34.0)
MCHC: 32.6 g/dL (ref 30.0–36.0)
MCV: 89.1 fL (ref 80.0–100.0)
Monocytes Absolute: 0.5 10*3/uL (ref 0.1–1.0)
Monocytes Relative: 15 %
Neutro Abs: 1.4 10*3/uL — ABNORMAL LOW (ref 1.7–7.7)
Neutrophils Relative %: 42 %
Platelets: 204 10*3/uL (ref 150–400)
RBC: 3.03 MIL/uL — ABNORMAL LOW (ref 3.87–5.11)
RDW: 18.2 % — ABNORMAL HIGH (ref 11.5–15.5)
WBC: 3.2 10*3/uL — ABNORMAL LOW (ref 4.0–10.5)
nRBC: 0 % (ref 0.0–0.2)

## 2021-04-15 LAB — PHOSPHORUS: Phosphorus: 2.7 mg/dL (ref 2.5–4.6)

## 2021-04-15 LAB — SARS CORONAVIRUS 2 (TAT 6-24 HRS): SARS Coronavirus 2: NEGATIVE

## 2021-04-15 LAB — GLUCOSE, CAPILLARY
Glucose-Capillary: 100 mg/dL — ABNORMAL HIGH (ref 70–99)
Glucose-Capillary: 101 mg/dL — ABNORMAL HIGH (ref 70–99)

## 2021-04-15 LAB — MAGNESIUM: Magnesium: 1.4 mg/dL — ABNORMAL LOW (ref 1.7–2.4)

## 2021-04-15 MED ORDER — MAGNESIUM SULFATE 4 GM/100ML IV SOLN: 4.0000 g | Freq: Once | INTRAVENOUS | Status: DC

## 2021-04-15 MED ORDER — MAGNESIUM SULFATE 2 GM/50ML IV SOLN
2.0000 g | Freq: Once | INTRAVENOUS | Status: AC
  Administered 2021-04-15: 2 g via INTRAVENOUS
  Filled 2021-04-15: qty 50

## 2021-04-15 NOTE — Progress Notes (Signed)
PROGRESS NOTE    Yvette Roberts  LGX:[id number] DOB: 04/03/1956 DOA: 03/27/2021 PCP: [name], FNP   No chief complaint on file.  Brief Narrative:  65 year old African-American female with Laporche Martelle past medical history metastatic uterine cancer, type 2 diabetes, hypertension malignant pleural effusion admitted with odynophagia and weight loss.  She reports losing 50 to 60 pounds in the last 3 months.  She underwent further work-up and had an EGD and found nonsevere esophagitis in the proximal and distal esophagus as well as scattered mild inflammation with erythema in the entire stomach.  She's been started on protonix and sucralfate.  Hospitalization complicated by febrile neutropenia and pseudomonas UTI, now s/p abx.   Assessment & Plan:   Principal Problem:   Esophagitis Active Problems:   Type 2 diabetes mellitus without complication (HCC)   Hypertension   Uterine cancer (HCC)   Malignant pleural effusion   Protein-calorie malnutrition, severe (HCC)   Odynophagia   Decreased urine output   Pressure injury of skin  Bilateral Lower Extremity Weakness Sister in law notes weakness worse since being in hospital (says she walked into hospital with walker) - therapy notes significantly weaker as well.  Weaker grip strength/upper extremities noted as well.  Unable to fully extend 3-5th fingers.     ? Critical myopathy, per discussion with oncology - B12 wnl Follow thiamine - give high dose thiamine  MRI L spine -> no regional metastatic disease, small focus of edema and enhancement at the inferior endplate of C14 favored to be discogenic in nature.  Multilevel degenerative disc disease and degenerative facet disease throughout the lumbar region.   MRI brain, C, T spine without acute abnormality (see reports) Neurology c/s, greatly appreciate their assistance - concerned for paraneoplastic polyradiculoneuropathy, CIDP, critical illness neuropathy - planning for LP, serum neoplastic  panel, thiamine supplementation If unrevealing, would follow outpatient EMG/NCS  Odynophagia and Esophagitis  Gastritis GERD -GI was consulted and underwent EGD on 03/29/21 which showed mild nonsevere esophagitis in the proximal and distal esophagus as well as scattered mild inflammation with erythema in the entire stomach but she had profound symptoms of Esophagitis -Her Bx -biopsy from the stomach shows reactive gastropathy and negative GI H. pylori.  No metaplasia dysplasia or malignancy.  Biopsy from the esophagus shows mild acute esophagitis negative for fungus.  No metaplasia or dysplasia or malignancy. Continue oxycodone 10 mg q6 prn for pain -GI recommending continuing Pantoprazole BID and Carafate  1 gram po TIDwm and qHS   -Patient to follow up with GI in the outpatient setting   Febrile Neutropenia-no further fevers she finished Syrah Daughtrey course of cefepime for 7 days.  Cefepime was stopped on 04/07/2021.    Pancytopenia- Hb relatively stable today --- s/p transfusion 04/09/2021.     Pseudomonas Aeruginosa UTI finished Landin Tallon course of cefepime.    Hypokalemia  Follow outpatient    Hypomagnesemia  Follow outpatient    Hypophosphatemia Follow outpatient   Refeeding syndrome monitor electrolytes  intermittently outpatient    Severe Protein Calorie Malnutrition -Nutritionist was consulted -she has just started to eat will need to monitor for refeeding syndrome.     Metastatic Uterine Cancer-followed by oncology holding off on further chemo at this time.  Oncology signed off. Oncology planning to follow outpatient in about 1 month   Malignant Pleural Effusion -CT Scan showed "Loculated RIGHT-sided pleural fluid with 6.5 x 3.6 cm axial dimension as compared to 8.1 x 3.9 cm. Basilar volume loss and signs of pleural  thickening with decreased pleural thickening, markedly diminished since the prior PET and with continued decreased pleural thickening particularly in the inferior RIGHT chest even  since the recent CT of the chest. Over the RIGHT hemidiaphragm pleural thickness approximately 7 mm as compared to 12 mm greatest thickness. LEFT chest is clear. Airways are patent." with the impression being "Decreased loculated pleural fluid and pleural thickening in the chest follows diminishing perihepatic soft tissue and capsular hepatic implants as described" -No Respiratory Distress Follow outpatient with oncology   Hypertension Continue coreg   Diabetes Mellitus Type 2 Follow outpatient A1c 6.1   Anxiety and Depression -C/w Mirtazapine 50 mg p.o. nightly and Fluoxetine 20 mg p.o. daily  Pressure Ulcer Pressure Injury 04/05/21 Sacrum Posterior;Medial;Lower Stage 2 -  Partial thickness loss of dermis presenting as Kammi Hechler shallow open injury with Samatha Anspach red, pink wound bed without slough. Appears to be stage two, bleeding right inside of buttocks and upper part of (Active)  04/05/21 2030  Location: Sacrum  Location Orientation: Posterior;Medial;Lower  Staging: Stage 2 -  Partial thickness loss of dermis presenting as Luci Bellucci shallow open injury with Rahmir Beever red, pink wound bed without slough.  Wound Description (Comments): Appears to be stage two, bleeding right inside of buttocks and upper part of bottom  Present on Admission: No   DVT prophylaxis:lovenox Code Status: full  Family Communication: son, daughter 6/18 Disposition:   Status is: Inpatient  Remains inpatient appropriate because:Inpatient level of care appropriate due to severity of illness  Dispo: The patient is from: Home              Anticipated d/c is to: Home              Patient currently is not medically stable to d/c.   Difficult to place patient No       Consultants:  Oncology GI  Procedures:  EGD - Non-severe esophagitis. Biopsied. - Gastritis. Biopsied. - Normal duodenal bulb, first portion of the duodenum and second portion of the duodenum.    Antimicrobials: Anti-infectives (From admission, onward)     Start     Dose/Rate Route Frequency Ordered Stop   04/01/21 0930  ceFEPIme (MAXIPIME) 2 g in sodium chloride 0.9 % 100 mL IVPB  Status:  Discontinued        2 g 200 mL/hr over 30 Minutes Intravenous Every 8 hours 04/01/21 0839 04/07/21 1345   03/27/21 1800  fluconazole (DIFLUCAN) IVPB 200 mg  Status:  Discontinued        200 mg 100 mL/hr over 60 Minutes Intravenous Daily 03/27/21 1734 03/29/21 1633          Subjective: Disappointed she's not going home  Objective: Vitals:   04/14/21 0526 04/14/21 1322 04/14/21 2058 04/15/21 0556  BP: (!) 145/103 120/85 130/82 (!) 150/96  Pulse: (!) 105 (!) 102 96 93  Resp: 20 18 16 16   Temp: 98.5 F (36.9 C) (!) 97.3 F (36.3 C) 98 F (36.7 C) 98.2 F (36.8 C)  TempSrc: Oral  Oral Oral  SpO2: (!) 89% 98% 97% 99%  Height:        Intake/Output Summary (Last 24 hours) at 04/15/2021 1314 Last data filed at 04/14/2021 1700 Gross per 24 hour  Intake 235 ml  Output --  Net 235 ml   There were no vitals filed for this visit.  Examination:  General: No acute distress. Cardiovascular: Heart sounds show Natelie Ostrosky regular rate, and rhythm.  Lungs: Clear to auscultation bilaterally  Abdomen: Soft,  nontender, nondistended Neurological: Alert and oriented 3. Bilateral foot drop, unable to lift heel off bed.  Abnormal grip.  Skin: Warm and dry. No rashes or lesions. Extremities: No clubbing or cyanosis. No edema.   Data Reviewed: I have personally reviewed following labs and imaging studies  CBC: Recent Labs  Lab 04/11/21 0549 04/12/21 0530 04/13/21 0522 04/14/21 0614 04/15/21 0500  WBC 4.1 3.8* 3.9* 3.4* 3.2*  NEUTROABS 2.4 2.0 2.3 1.6* 1.4*  HGB 9.1* 9.1* 9.3* 9.4* 8.8*  HCT 27.5* 27.2* 27.7* 28.7* 27.0*  MCV 86.8 86.9 87.1 88.3 89.1  PLT 116* 145* 182 211 630    Basic Metabolic Panel: Recent Labs  Lab 04/09/21 0514 04/10/21 0602 04/11/21 2157 04/12/21 0530 04/12/21 0537 04/13/21 0522 04/14/21 0614 04/14/21 1136 04/15/21 0500   NA 137  --   --  136  --  138 137  --  137  K 3.3*  --   --  3.7  --  3.5 3.3*  --  3.8  CL 104  --   --  100  --  100 99  --  103  CO2 27  --   --  27  --  28 29  --  28  GLUCOSE 101*  --   --  104*  --  112* 102*  --  102*  BUN <5*  --   --  6*  --  6* 8  --  8  CREATININE 0.39* 0.35*  --  0.38*  --  0.35* 0.34*  --  0.40*  CALCIUM 8.2*  --   --  9.2  --  9.2 9.3  --  9.1  MG 1.6*  --   --  1.2*  --  1.7 1.4*  --  1.4*  PHOS 2.2*  --  3.1  --  3.1  --   --  3.6 2.7    GFR: CrCl cannot be calculated (Unknown ideal weight.).  Liver Function Tests: Recent Labs  Lab 04/12/21 0530 04/13/21 0522 04/14/21 0614 04/15/21 0500  AST 20 21 23 21   ALT 22 22 23 22   ALKPHOS 52 53 51 46  BILITOT 0.7 0.7 0.7 0.7  PROT 5.7* 6.0* 5.9* 5.7*  ALBUMIN 2.6* 2.6* 2.8* 2.6*    CBG: Recent Labs  Lab 04/14/21 0728 04/14/21 1204 04/14/21 1733 04/15/21 0754 04/15/21 1200  GLUCAP 96 100* 107* 100* 101*     Recent Results (from the past 240 hour(s))  SARS CORONAVIRUS 2 (TAT 6-24 HRS) Nasopharyngeal Nasopharyngeal Swab     Status: None   Collection Time: 04/09/21  2:12 PM   Specimen: Nasopharyngeal Swab  Result Value Ref Range Status   SARS Coronavirus 2 NEGATIVE NEGATIVE Final    Comment: (NOTE) SARS-CoV-2 target nucleic acids are NOT DETECTED.  The SARS-CoV-2 RNA is generally detectable in upper and lower respiratory specimens during the acute phase of infection. Negative results do not preclude SARS-CoV-2 infection, do not rule out co-infections with other pathogens, and should not be used as the sole basis for treatment or other patient management decisions. Negative results must be combined with clinical observations, patient history, and epidemiological information. The expected result is Negative.  Fact Sheet for Patients: SugarRoll.be  Fact Sheet for Healthcare Providers: https://www.woods-mathews.com/  This test is not yet  approved or cleared by the [place] FDA and  has been authorized for detection and/or diagnosis of SARS-CoV-2 by FDA under an Emergency Use Authorization (EUA). This EUA will remain  in effect (meaning  this test can be used) for the duration of the COVID-19 declaration under Se ction 564(b)(1) of the Act, 21 U.S.C. section 360bbb-3(b)(1), unless the authorization is terminated or revoked sooner.  Performed at [facility], [place] [address] [place], [place] [address]   SARS CORONAVIRUS 2 (TAT 6-24 HRS) Nasopharyngeal Nasopharyngeal Swab     Status: None   Collection Time: 04/14/21  1:08 PM   Specimen: Nasopharyngeal Swab  Result Value Ref Range Status   SARS Coronavirus 2 NEGATIVE NEGATIVE Final    Comment: (NOTE) SARS-CoV-2 target nucleic acids are NOT DETECTED.  The SARS-CoV-2 RNA is generally detectable in upper and lower respiratory specimens during the acute phase of infection. Negative results do not preclude SARS-CoV-2 infection, do not rule out co-infections with other pathogens, and should not be used as the sole basis for treatment or other patient management decisions. Negative results must be combined with clinical observations, patient history, and epidemiological information. The expected result is Negative.  Fact Sheet for Patients: SugarRoll.be  Fact Sheet for Healthcare Providers: https://www.woods-mathews.com/  This test is not yet approved or cleared by the [place] FDA and  has been authorized for detection and/or diagnosis of SARS-CoV-2 by FDA under an Emergency Use Authorization (EUA). This EUA will remain  in effect (meaning this test can be used) for the duration of the COVID-19 declaration under Se ction 564(b)(1) of the Act, 21 U.S.C. section 360bbb-3(b)(1), unless the authorization is terminated or revoked sooner.  Performed at [facility], [place] [address] [place], [place] [address]           Radiology Studies: MR BRAIN W WO CONTRAST  Result Date: 04/13/2021 CLINICAL DATA:  bilateral lower extremity weakness. EXAM: MRI HEAD WITHOUT AND WITH CONTRAST TECHNIQUE: Multiplanar, multiecho pulse sequences of the brain and surrounding structures were obtained without and with intravenous contrast. CONTRAST:  7.3mL GADAVIST GADOBUTROL 1 MMOL/ML IV SOLN COMPARISON:  None. FINDINGS: Brain: No acute infarct, mass effect or extra-axial collection. No acute or chronic hemorrhage. There is multifocal hyperintense T2-weighted signal within the white matter. Generalized volume loss without Katoya Amato clear lobar predilection. Hyperintense T2-weighted signal within the brainstem is likely due to chronic small vessel disease. The midline structures are normal. There is no abnormal contrast enhancement. Vascular: Major flow voids are preserved. Skull and upper cervical spine: Normal calvarium and skull base. Visualized upper cervical spine and soft tissues are normal. Sinuses/Orbits:No paranasal sinus fluid levels or advanced mucosal thickening. No mastoid or middle ear effusion. Normal orbits. IMPRESSION: 1. No acute intracranial abnormality. 2. Chronic small vessel disease and generalized volume loss. Electronically Signed   By: Ulyses Jarred M.D.   On: 04/13/2021 23:43   MR CERVICAL SPINE W WO CONTRAST  Result Date: 04/13/2021 CLINICAL DATA:  Bilateral lower extremity weakness EXAM: MRI CERVICAL AND THORACIC SPINE WITHOUT AND WITH CONTRAST TECHNIQUE: Multiplanar and multiecho pulse sequences of the cervical spine, to include the craniocervical junction and cervicothoracic junction, and the thoracic spine, were obtained without and with intravenous contrast. CONTRAST:  7.46mL GADAVIST GADOBUTROL 1 MMOL/ML IV SOLN COMPARISON:  None. FINDINGS: MRI CERVICAL SPINE FINDINGS Alignment: Physiologic. Vertebrae: No fracture, evidence of discitis, or bone lesion. Cord: Normal signal and morphology. No abnormal contrast  enhancement. Posterior Fossa, vertebral arteries, paraspinal tissues: Negative. Disc levels: C1-2: Unremarkable. C2-3: Normal disc space and facet joints. There is no spinal canal stenosis. No neural foraminal stenosis. C3-4: Small right asymmetric disc bulge with uncovertebral hypertrophy. There is no spinal canal  stenosis. Mild right neural foraminal stenosis. C4-5: Small disc bulge. There is no spinal canal stenosis. No neural foraminal stenosis. C5-6: Normal disc space and facet joints. There is no spinal canal stenosis. No neural foraminal stenosis. C6-7: Normal disc space and facet joints. There is no spinal canal stenosis. No neural foraminal stenosis. C7-T1: Normal disc space and facet joints. There is no spinal canal stenosis. No neural foraminal stenosis. MRI THORACIC SPINE FINDINGS Alignment:  Physiologic. Vertebrae: No fracture, evidence of discitis, or bone lesion. Cord: Normal signal and morphology. No abnormal contrast enhancement. Paraspinal and other soft tissues: Loculated fluid collection in the posterior right mid chest. Disc levels: T4-5: Small central disc protrusion without stenosis. T5-6: Small central disc protrusion without stenosis. T6-7: Small central disc protrusion effacing the ventral thecal sac. No spinal canal stenosis. T7-8: Minimal disc bulge without stenosis. T10-11: Small right subarticular disc protrusion with mild right foraminal narrowing. No spinal canal stenosis. The other thoracic levels are unremarkable. IMPRESSION: 1. No acute abnormality of the cervical or thoracic spine. 2. Mild cervical degenerative disc disease without spinal canal stenosis. 3. Mild right C3-4 and right T10-11 neural foraminal stenosis. 4. Loculated fluid collection in the posterior right mid chest, as previously seen on PET-CT. Electronically Signed   By: Ulyses Jarred M.D.   On: 04/13/2021 23:54   MR THORACIC SPINE W WO CONTRAST  Result Date: 04/13/2021 CLINICAL DATA:  Bilateral lower extremity  weakness EXAM: MRI CERVICAL AND THORACIC SPINE WITHOUT AND WITH CONTRAST TECHNIQUE: Multiplanar and multiecho pulse sequences of the cervical spine, to include the craniocervical junction and cervicothoracic junction, and the thoracic spine, were obtained without and with intravenous contrast. CONTRAST:  7.14mL GADAVIST GADOBUTROL 1 MMOL/ML IV SOLN COMPARISON:  None. FINDINGS: MRI CERVICAL SPINE FINDINGS Alignment: Physiologic. Vertebrae: No fracture, evidence of discitis, or bone lesion. Cord: Normal signal and morphology. No abnormal contrast enhancement. Posterior Fossa, vertebral arteries, paraspinal tissues: Negative. Disc levels: C1-2: Unremarkable. C2-3: Normal disc space and facet joints. There is no spinal canal stenosis. No neural foraminal stenosis. C3-4: Small right asymmetric disc bulge with uncovertebral hypertrophy. There is no spinal canal stenosis. Mild right neural foraminal stenosis. C4-5: Small disc bulge. There is no spinal canal stenosis. No neural foraminal stenosis. C5-6: Normal disc space and facet joints. There is no spinal canal stenosis. No neural foraminal stenosis. C6-7: Normal disc space and facet joints. There is no spinal canal stenosis. No neural foraminal stenosis. C7-T1: Normal disc space and facet joints. There is no spinal canal stenosis. No neural foraminal stenosis. MRI THORACIC SPINE FINDINGS Alignment:  Physiologic. Vertebrae: No fracture, evidence of discitis, or bone lesion. Cord: Normal signal and morphology. No abnormal contrast enhancement. Paraspinal and other soft tissues: Loculated fluid collection in the posterior right mid chest. Disc levels: T4-5: Small central disc protrusion without stenosis. T5-6: Small central disc protrusion without stenosis. T6-7: Small central disc protrusion effacing the ventral thecal sac. No spinal canal stenosis. T7-8: Minimal disc bulge without stenosis. T10-11: Small right subarticular disc protrusion with mild right foraminal  narrowing. No spinal canal stenosis. The other thoracic levels are unremarkable. IMPRESSION: 1. No acute abnormality of the cervical or thoracic spine. 2. Mild cervical degenerative disc disease without spinal canal stenosis. 3. Mild right C3-4 and right T10-11 neural foraminal stenosis. 4. Loculated fluid collection in the posterior right mid chest, as previously seen on PET-CT. Electronically Signed   By: Ulyses Jarred M.D.   On: 04/13/2021 23:54  Scheduled Meds:  carvedilol  12.5 mg Oral BID WC   chlorhexidine  15 mL Mouth/Throat QID   Chlorhexidine Gluconate Cloth  6 each Topical Daily   feeding supplement  237 mL Oral TID BM   FLUoxetine  20 mg Oral Daily   gabapentin  200 mg Oral BID   Gerhardt's butt cream   Topical BID   magic mouthwash w/lidocaine  2 mL Oral TID   magnesium oxide  800 mg Oral Daily   mouth rinse  15 mL Mouth Rinse q12n4p   melatonin  10 mg Oral QHS   mirtazapine  15 mg Oral QHS   multivitamin with minerals  1 tablet Oral Daily   nutrition supplement (JUVEN)  1 packet Oral BID WC   pantoprazole sodium  40 mg Oral BID   polyethylene glycol  17 g Oral Daily   potassium chloride  40 mEq Oral Daily   protein supplement  1 Scoop Oral TID WC   sucralfate  1 g Oral TID WC & HS   [START ON 04/18/2021] thiamine injection  100 mg Intravenous Daily   Continuous Infusions:  magnesium sulfate bolus IVPB 2 g (04/15/21 1234)   thiamine injection 500 mg (04/15/21 1051)     LOS: 19 days    Time spent: over 30 min    Fayrene Helper, MD [facility]   To contact the attending provider between 7A-7P or the covering provider during after hours 7P-7A, please log into the web site [email or web address] and access using universal [place] password for that web site. If you do not have the password, please call the hospital operator.  04/15/2021, 1:14 PM

## 2021-04-16 ENCOUNTER — Inpatient Hospital Stay (HOSPITAL_COMMUNITY): Payer: Medicare Other

## 2021-04-16 DIAGNOSIS — K209 Esophagitis, unspecified without bleeding: Secondary | ICD-10-CM | POA: Diagnosis not present

## 2021-04-16 LAB — COMPREHENSIVE METABOLIC PANEL
ALT: 26 U/L (ref 0–44)
AST: 26 U/L (ref 15–41)
Albumin: 2.7 g/dL — ABNORMAL LOW (ref 3.5–5.0)
Alkaline Phosphatase: 48 U/L (ref 38–126)
Anion gap: 6 (ref 5–15)
BUN: 9 mg/dL (ref 8–23)
CO2: 29 mmol/L (ref 22–32)
Calcium: 9.2 mg/dL (ref 8.9–10.3)
Chloride: 102 mmol/L (ref 98–111)
Creatinine, Ser: 0.37 mg/dL — ABNORMAL LOW (ref 0.44–1.00)
GFR, Estimated: >60 mL/min (ref >60)
Glucose, Bld: 116 mg/dL — ABNORMAL HIGH (ref 70–99)
Potassium: 3.5 mmol/L (ref 3.5–5.1)
Sodium: 137 mmol/L (ref 135–145)
Total Bilirubin: 0.4 mg/dL (ref 0.3–1.2)
Total Protein: 5.8 g/dL — ABNORMAL LOW (ref 6.5–8.1)

## 2021-04-16 LAB — GLUCOSE, CAPILLARY: Glucose-Capillary: 127 mg/dL — ABNORMAL HIGH (ref 70–99)

## 2021-04-16 LAB — CBC WITH DIFFERENTIAL/PLATELET
Abs Immature Granulocytes: 0.03 10*3/uL (ref 0.00–0.07)
Basophils Absolute: 0 10*3/uL (ref 0.0–0.1)
Basophils Relative: 0 %
Eosinophils Absolute: 0 10*3/uL (ref 0.0–0.5)
Eosinophils Relative: 1 %
HCT: 27.1 % — ABNORMAL LOW (ref 36.0–46.0)
Hemoglobin: 8.9 g/dL — ABNORMAL LOW (ref 12.0–15.0)
Immature Granulocytes: 1 %
Lymphocytes Relative: 44 %
Lymphs Abs: 1.3 10*3/uL (ref 0.7–4.0)
MCH: 29.3 pg (ref 26.0–34.0)
MCHC: 32.8 g/dL (ref 30.0–36.0)
MCV: 89.1 fL (ref 80.0–100.0)
Monocytes Absolute: 0.4 10*3/uL (ref 0.1–1.0)
Monocytes Relative: 12 %
Neutro Abs: 1.3 10*3/uL — ABNORMAL LOW (ref 1.7–7.7)
Neutrophils Relative %: 42 %
Platelets: 195 10*3/uL (ref 150–400)
RBC: 3.04 MIL/uL — ABNORMAL LOW (ref 3.87–5.11)
RDW: 18.2 % — ABNORMAL HIGH (ref 11.5–15.5)
WBC: 3.1 10*3/uL — ABNORMAL LOW (ref 4.0–10.5)
nRBC: 0 % (ref 0.0–0.2)

## 2021-04-16 LAB — MAGNESIUM: Magnesium: 1.4 mg/dL — ABNORMAL LOW (ref 1.7–2.4)

## 2021-04-16 LAB — CSF CELL COUNT WITH DIFFERENTIAL
RBC Count, CSF: 0 /mm3
Tube #: 3
WBC, CSF: 1 /mm3 (ref 0–5)

## 2021-04-16 LAB — GLUCOSE, CSF: Glucose, CSF: 58 mg/dL (ref 40–70)

## 2021-04-16 LAB — PROTEIN, CSF: Total  Protein, CSF: 40 mg/dL (ref 15–45)

## 2021-04-16 LAB — PHOSPHORUS: Phosphorus: 2.6 mg/dL (ref 2.5–4.6)

## 2021-04-16 MED ORDER — MAGNESIUM SULFATE 2 GM/50ML IV SOLN
2.0000 g | Freq: Once | INTRAVENOUS | Status: AC
  Administered 2021-04-16: 2 g via INTRAVENOUS
  Filled 2021-04-16: qty 50

## 2021-04-16 MED ORDER — LIDOCAINE HCL (PF) 1 % IJ SOLN
4.0000 mL | Freq: Once | INTRAMUSCULAR | Status: AC
  Administered 2021-04-16: 4 mL via INTRADERMAL

## 2021-04-16 MED ORDER — IMMUNE GLOBULIN (HUMAN) 10 GM/100ML IV SOLN
30.0000 g | INTRAVENOUS | Status: AC
  Administered 2021-04-16 – 2021-04-21 (×5): 30 g via INTRAVENOUS
  Filled 2021-04-16 (×5): qty 100
  Filled 2021-04-16: qty 300

## 2021-04-16 NOTE — Progress Notes (Signed)
Neurology Progress Note  S: Patient c/o continued LE weakness. She is asking about when she can go home. NP explained plan and that we are awaiting her LP results before deciding on IVIG or PLEX. She just returned from LP under fluoro about one hour ago. States she has no shooting pains to her LEs and denies HA.   PT has already recommended SNF post discharge.   O: Current vital signs: BP (!) 134/94 (BP Location: Left Arm)   Pulse 95   Temp 98.3 F (36.8 C) (Oral)   Resp 16   Ht 5\' 6"  (1.676 m)   SpO2 99%   BMI 27.37 kg/m  Vital signs in last 24 hours: Temp:  [98.3 F (36.8 C)-98.8 F (37.1 C)] 98.3 F (36.8 C) (06/20 0505) Pulse Rate:  [93-95] 95 (06/20 0505) Resp:  [16] 16 (06/20 0505) BP: (134-140)/(83-94) 134/94 (06/20 0505) SpO2:  [99 %] 99 % (06/20 0505)  GENERAL: Awake, alert in NAD. HEENT: Normocephalic and atraumatic. LUNGS: Normal respiratory effort.  CV: RRR. Ext: warm. Psych: Affect appropriate to situation.   NEURO:  Mental Status: Alert and oriented except to month, day, and date.  Speech/Language: speech is without aphasia or dysarthria.  Naming, repetition, fluency, and comprehension intact.  Cranial Nerves:  II: PERRL. Visual fields full.  III, IV, VI: EOMI. Eyelids elevate symmetrically.  V: Sensation is intact to light touch and symmetrical to face.  VII: Smile is symmetrical.  VIII: hearing intact to voice. IX, X: Palate elevates symmetrically. Phonation is normal.  XI: Shoulder shrug 5/5. XII: tongue is midline without fasciculations. Motor: 4/5 strength biceps/triceps. Grips 3+/5. BLEs-she can lift her legs up about 4 inches off the bed. Dorsi/plantar flexion 1/5.   Tone: is decreased, bulk is normal.  Sensation- Intact to light touch bilaterally. Extinction absent to light touch to DSS. Hyper paraesthesia to bilateral feet. Sensation with vibration present in UE/LEs.  Coordination: Unable to perform HKS due to weakness.  DTRs: 2+ UEs.  Patellars 0. Tibial 0.  Gait- deferred  Medications  Current Facility-Administered Medications:    acetaminophen (TYLENOL) tablet 650 mg, 650 mg, Oral, Q6H PRN, 650 mg at 04/10/21 1753 **OR** acetaminophen (TYLENOL) suppository 650 mg, 650 mg, Rectal, Q4H PRN, Lang Snow, FNP   carvedilol (COREG) tablet 12.5 mg, 12.5 mg, Oral, BID WC, Domenic Polite, MD, 12.5 mg at 04/16/21 0742   chlorhexidine (PERIDEX) 0.12 % solution 15 mL, 15 mL, Mouth/Throat, QID, Georgette Shell, MD, 15 mL at 04/16/21 0946   Chlorhexidine Gluconate Cloth 2 % PADS 6 each, 6 each, Topical, Daily, Domenic Polite, MD, 6 each at 04/16/21 0947   feeding supplement (ENSURE ENLIVE / ENSURE PLUS) liquid 237 mL, 237 mL, Oral, TID BM, Sheikh, Omair Latif, DO, 237 mL at 04/16/21 0954   FLUoxetine (PROZAC) 20 MG/5ML solution 20 mg, 20 mg, Oral, Daily, Domenic Polite, MD, 20 mg at 04/16/21 0950   gabapentin (NEURONTIN) capsule 200 mg, 200 mg, Oral, BID, Domenic Polite, MD, 200 mg at 04/16/21 3785   Gerhardt's butt cream, , Topical, BID, Georgette Shell, MD, Given at 04/16/21 [phone]   lip balm (CARMEX) ointment 1 application, 1 application, Topical, PRN, Vashti Hey, MD   magic mouthwash w/lidocaine, 2 mL, Oral, TID, Domenic Polite, MD, 2 mL at 04/16/21 0950   magnesium oxide (MAG-OX) tablet 800 mg, 800 mg, Oral, Daily, Georgette Shell, MD, 800 mg at 04/16/21 2774   MEDLINE mouth rinse, 15 mL, Mouth Rinse, q12n4p, Domenic Polite, MD,  15 mL at 04/16/21 1204   melatonin tablet 10 mg, 10 mg, Oral, QHS, Domenic Polite, MD, 10 mg at 04/15/21 2323   mirtazapine (REMERON SOL-TAB) disintegrating tablet 15 mg, 15 mg, Oral, QHS, Domenic Polite, MD, 15 mg at 04/15/21 2324   multivitamin with minerals tablet 1 tablet, 1 tablet, Oral, Daily, Raiford Noble Latif, DO, 1 tablet at 04/16/21 0946   naloxone (NARCAN) injection 0.4 mg, 0.4 mg, Intravenous, PRN, [place] Crane, Carole N, DO   nutrition supplement (JUVEN) (JUVEN)  powder packet 1 packet, 1 packet, Oral, BID WC, Georgette Shell, MD, 1 packet at 04/16/21 0745   ondansetron [facility]) tablet 4 mg, 4 mg, Oral, Q6H PRN, 4 mg at 04/05/21 1817 **OR** ondansetron (ZOFRAN) injection 4 mg, 4 mg, Intravenous, Q6H PRN, Domenic Polite, MD, 4 mg at 04/05/21 8676   oxyCODONE (Oxy IR/ROXICODONE) immediate release tablet 10 mg, 10 mg, Oral, Q6H PRN, Georgette Shell, MD, 10 mg at 04/16/21 1322   pantoprazole sodium (PROTONIX) 40 mg/20 mL oral suspension 40 mg, 40 mg, Oral, BID, Arlyn Dunning M, RPH, 40 mg at 04/16/21 1014   phenol (CHLORASEPTIC) mouth spray 1 spray, 1 spray, Mouth/Throat, PRN, Domenic Polite, MD, 1 spray at 03/30/21 0800   polyethylene glycol (MIRALAX / GLYCOLAX) packet 17 g, 17 g, Oral, Daily, Domenic Polite, MD, 17 g at 04/16/21 0946   potassium chloride SA (KLOR-CON) CR tablet 40 mEq, 40 mEq, Oral, Daily, Georgette Shell, MD, 40 mEq at 04/16/21 0946   protein supplement (RESOURCE BENEPROTEIN) powder packet 6 g, 1 Scoop, Oral, TID WC, Sheikh, Omair Latif, DO, 6 g at 04/16/21 0745   sucralfate (CARAFATE) tablet 1 g, 1 g, Oral, TID WC & HS, Brahmbhatt, Parag, MD, 1 g at 04/16/21 1156   thiamine 500mg  in normal saline (23ml) IVPB, 500 mg, Intravenous, Q8H, Last Rate: 100 mL/hr at 04/16/21 0953, 500 mg at 04/16/21 0953 **FOLLOWED BY** [START ON 04/18/2021] thiamine (B-1) injection 100 mg, 100 mg, Intravenous, Daily, Elodia Florence., MD  Pertinent Labs Results for CORNELIA, WALRAVEN (MRN [id number]) as of 04/16/2021 13:35  Ref. Range 04/16/2021 09:30  Appearance, CSF Latest Ref Range: CLEAR  CLEAR  Glucose, CSF Latest Ref Range: 40 - 70 mg/dL 58  RBC Count, CSF Latest Ref Range: 0 /cu mm 0  WBC, CSF Latest Ref Range: 0 - 5 /cu mm 1  Segmented Neutrophils-CSF Latest Ref Range: 0 - 6 % TOO FEW TO COUNT, SMEAR AVAILABLE FOR REVIEW  Lymphs, CSF Latest Ref Range: 40 - 80 % TOO FEW TO COUNT, SMEAR AVAILABLE FOR REVIEW  Monocyte-Macrophage-Spinal Fluid  Latest Ref Range: 15 - 45 % TOO FEW TO COUNT, SMEAR AVAILABLE FOR REVIEW  Eosinophils, CSF Latest Ref Range: 0 - 1 % TOO FEW TO COUNT, SMEAR AVAILABLE FOR REVIEW  Other Cells, CSF Unknown PENDING  Color, CSF Latest Ref Range: COLORLESS  COLORLESS  Supernatant Unknown NOT INDICATED  Total  Protein, CSF Latest Ref Range: 15 - 45 mg/dL 40  Tube # Unknown 3   Imaging MD has reviewed images in epic and the results pertinent to this consultation are:  MRI Brain  1. No acute intracranial abnormality. 2. Chronic small vessel disease and generalized volume loss.   MRI cspine and tspine.  1. No acute abnormality of the cervical or thoracic spine. 2. Mild cervical degenerative disc disease without spinal canal stenosis. 3. Mild right C3-4 and right T10-11 neural foraminal stenosis. 4. Loculated fluid collection in the posterior right mid chest,  as previously seen on PET-CT.  Assessment: 65 yo female who neurology was asked to consult for progressive BLE weakness. Per consult note, thought was given to CIDP given her diffuse areflexia, but she has UE reflexes today. Her Cspine and Tspine imaging did not show any abnormalities associated with cord compression or severe stenosis, so spinal issue is likely not the cause of her weakness. She is s/p LP today with preliminary results without signs of infection. Her B12 is over 500 which is good. The Vitamin B1 level was not drawn prior to Thiamine administration, so that result is no longer needed. Her generalized debility and PTM (prealbumin is low and she has had decreased po intake before admission) could be playing a role as well. Paraneoplastic syndrome must be considered due to recently discovered neoplasm. This could manifest as decreased sensation, but she seems to have heightened sensation. It may also be associated with speech difficulties which she does not exhibit, but weakness and areflexia are usual findings. Paraneoplastic panel was sent out and  will take a few days to result.  Discussed IVIG and PLEX with patient and son along with risks and benefits. They opted for IVIG at this time.   Recommendations: - Recommend IVIG 400mg /Kg Q24 hours x 5 doses.  -Continue PT/OT unless they have signed off. NP saw recommendation for SNF.  -Continue high dose Thiamine.  -Optimize nutrition.  -Follow paraneoplastic labs.  -Out patient EMG/NCS.   Pt seen by Clance Boll, MSN, APN-BC/Nurse Practitioner/Neuro and later by MD. Note and plan to be edited as needed by MD.  Pager: [phone]   NEUROHOSPITALIST ADDENDUM Performed a face to face diagnostic evaluation.   I have reviewed the contents of history and physical exam as documented by PA/ARNP/Resident and agree with above documentation.  I have discussed and formulated the above plan as documented. Edits to the note have been made as needed.  Impression/Key exam findings/Plan: Few months hx of progressive BL lower ext weakness with hyperasthesia and paresthesias with decreased reflexes along with some BL hand weakness. No bulbar weakness. Recent diagnosis of metastatic uterine cancer s/p chemo with good response. Has had significant weight loss. High suspicion for potential Paraneoplastic process.  CT Chest, AP on 03/28/21 suggestive of treated mets with decreased bulk and no adenopathy.  LP with no pleocytosis, no elevated protein. Not concerning for potential infection. Paraneoplastic panel is pending. Normal B12, TSH. Has been on a multivitamin here so nutritional studies are likely not going to be reliable.  Will do IVIG given concern for paraneoplastic process. Discussed risks and benefits with patient and her son.  Discussed with Dr. Florene Glen over secure chat.  Donnetta Simpers, MD [facility] [phone]   If 7pm to 7am, please call on call as listed on AMION.

## 2021-04-16 NOTE — Procedures (Signed)
Technically successful fluoroscopically guided L3-L4 lumbar puncture.   16.5 mL of CSF obtained for laboratory studies.   No immediate postprocedure complication.

## 2021-04-16 NOTE — Progress Notes (Addendum)
PROGRESS NOTE    Yvette Roberts  Yvette Roberts:[id number] DOB: 1955/12/22 DOA: 03/27/2021 PCP: Yvette Hatchet, FNP   No chief complaint on file.  Brief Narrative:  65 year old African-American female with Yvette Roberts past medical history metastatic uterine cancer, type 2 diabetes, hypertension malignant pleural effusion admitted with odynophagia and weight loss.  She reports losing 50 to 60 pounds in the last 3 months.  She underwent further work-up and had an EGD and found nonsevere esophagitis in the proximal and distal esophagus as well as scattered mild inflammation with erythema in the entire stomach.  She's been started on protonix and sucralfate.  Hospitalization complicated by febrile neutropenia and pseudomonas UTI, now s/p abx.   Assessment & Plan:   Principal Problem:   Esophagitis Active Problems:   Type 2 diabetes mellitus without complication (HCC)   Hypertension   Uterine cancer (HCC)   Malignant pleural effusion   Protein-calorie malnutrition, severe (HCC)   Odynophagia   Decreased urine output   Pressure injury of skin  Bilateral Lower Extremity Weakness Sister in law notes weakness worse since being in hospital (says she walked into hospital with walker) - therapy notes significantly weaker as well.  Weaker grip strength/upper extremities noted as well.  Unable to fully extend 3-5th fingers.     ? Critical myopathy, per discussion with oncology - B12 wnl Follow thiamine (not drawn unfortunately)- give high dose thiamine  MRI L spine -> no regional metastatic disease, small focus of edema and enhancement at the inferior endplate of Yvette Roberts favored to be discogenic in nature.  Multilevel degenerative disc disease and degenerative facet disease throughout the lumbar region.   MRI brain, C, T spine without acute abnormality (see reports) Neurology c/s, greatly appreciate their assistance - concerned for paraneoplastic polyradiculoneuropathy, CIDP, critical illness neuropathy  S/p LP -> 1  WBC, protein wnl, glucose wnl, RBC's 0.  Gram stain without organisms, pending culture.   - follow HSV, ologoclonal bands, VDRL, cytology, IgG CSF index Follow paraneoplastic panel Planning for IVIG If unrevealing, would follow outpatient EMG/NCS  Odynophagia and Esophagitis  Gastritis GERD -GI was consulted and underwent EGD on 03/29/21 which showed mild nonsevere esophagitis in the proximal and distal esophagus as well as scattered mild inflammation with erythema in the entire stomach but she had profound symptoms of Esophagitis -Her Bx -biopsy from the stomach shows reactive gastropathy and negative GI H. pylori.  No metaplasia dysplasia or malignancy.  Biopsy from the esophagus shows mild acute esophagitis negative for fungus.  No metaplasia or dysplasia or malignancy. Continue oxycodone 10 mg q6 prn for pain -GI recommending continuing Pantoprazole BID and Carafate  1 gram po TIDwm and qHS   -Patient to follow up with GI in the outpatient setting   Febrile Neutropenia-no further fevers she finished Yvette Roberts course of cefepime for 7 days.  Cefepime was stopped on 04/07/2021.    Pancytopenia- Hb relatively stable today --- s/p transfusion 04/09/2021.     Pseudomonas Aeruginosa UTI finished Yvette Roberts course of cefepime.    Hypokalemia  Follow outpatient    Hypomagnesemia  Follow outpatient    Hypophosphatemia Follow outpatient   Refeeding syndrome monitor electrolytes  intermittently outpatient    Severe Protein Calorie Malnutrition -Nutritionist was consulted -she has just started to eat will need to monitor for refeeding syndrome.     Metastatic Uterine Cancer-followed by oncology holding off on further chemo at this time.  Oncology signed off. Oncology planning to follow outpatient in about 1 month   Malignant  Pleural Effusion -CT Scan showed "Loculated RIGHT-sided pleural fluid with 6.5 x 3.6 cm axial dimension as compared to 8.1 x 3.9 cm. Basilar volume loss and signs of pleural thickening  with decreased pleural thickening, markedly diminished since the prior PET and with continued decreased pleural thickening particularly in the inferior RIGHT chest even since the recent CT of the chest. Over the RIGHT hemidiaphragm pleural thickness approximately 7 mm as compared to 12 mm greatest thickness. LEFT chest is clear. Airways are patent." with the impression being "Decreased loculated pleural fluid and pleural thickening in the chest follows diminishing perihepatic soft tissue and capsular hepatic implants as described" -No Respiratory Distress Follow outpatient with oncology   Hypertension Continue coreg   Diabetes Mellitus Type 2 Follow outpatient A1c 6.1   Anxiety and Depression -C/w Mirtazapine 50 mg p.o. nightly and Fluoxetine 20 mg p.o. daily  Pressure Ulcer Pressure Injury 04/05/21 Sacrum Posterior;Medial;Lower Stage 2 -  Partial thickness loss of dermis presenting as Yvette Roberts shallow open injury with Yvette Roberts red, pink wound bed without slough. Appears to be stage two, bleeding right inside of buttocks and upper part of (Active)  04/05/21 2030  Location: Sacrum  Location Orientation: Posterior;Medial;Lower  Staging: Stage 2 -  Partial thickness loss of dermis presenting as Kynnedi Zweig shallow open injury with Yvette Roberts red, pink wound bed without slough.  Wound Description (Comments): Appears to be stage two, bleeding right inside of buttocks and upper part of bottom  Present on Admission: No   S/p covid vaccine 6/14  DVT prophylaxis:lovenox Code Status: full  Family Communication: son, daughter 6/18 Disposition:   Status is: Inpatient  Remains inpatient appropriate because:Inpatient level of care appropriate due to severity of illness  Dispo: The patient is from: Home              Anticipated d/c is to: Home              Patient currently is not medically stable to d/c.   Difficult to place patient No       Consultants:  Oncology GI  Procedures:  EGD - Non-severe esophagitis.  Biopsied. - Gastritis. Biopsied. - Normal duodenal bulb, first portion of the duodenum and second portion of the duodenum.    Antimicrobials: Anti-infectives (From admission, onward)    Start     Dose/Rate Route Frequency Ordered Stop   04/01/21 0930  ceFEPIme (MAXIPIME) 2 g in sodium chloride 0.9 % 100 mL IVPB  Status:  Discontinued        2 g 200 mL/hr over 30 Minutes Intravenous Every 8 hours 04/01/21 0839 04/07/21 1345   03/27/21 1800  fluconazole (DIFLUCAN) IVPB 200 mg  Status:  Discontinued        200 mg 100 mL/hr over 60 Minutes Intravenous Daily 03/27/21 1734 03/29/21 1633          Subjective: No new complaints Continued weakness, disappointed she's not leaving  Objective: Vitals:   04/15/21 1251 04/15/21 2002 04/16/21 0505 04/16/21 1411  BP: (!) 148/86 140/83 (!) 134/94 120/79  Pulse: 98 93 95 95  Resp: 15 16 16 16   Temp: 98.2 F (36.8 C) 98.8 F (37.1 C) 98.3 F (36.8 C) 98.6 F (37 C)  TempSrc: Oral Oral Oral Oral  SpO2: 100% 99% 99% 96%  Height:        Intake/Output Summary (Last 24 hours) at 04/16/2021 1721 Last data filed at 04/16/2021 2979 Gross per 24 hour  Intake 368.92 ml  Output 750 ml  Net -381.08  ml   There were no vitals filed for this visit.  Examination:  General: No acute distress. Cardiovascular: Heart sounds show Ronna Herskowitz regular rate, and rhythm. Lungs: Clear to auscultation bilaterally Abdomen: Soft, nontender, nondistended  Neurological: Alert and oriented 3. Bilateral LE weakness with foot drop.  Upper extremity grip weakness. Skin: Warm and dry. No rashes or lesions. Extremities: No clubbing or cyanosis. No edema.    Data Reviewed: I have personally reviewed following labs and imaging studies  CBC: Recent Labs  Lab 04/12/21 0530 04/13/21 0522 04/14/21 0614 04/15/21 0500 04/16/21 0623  WBC 3.8* 3.9* 3.4* 3.2* 3.1*  NEUTROABS 2.0 2.3 1.6* 1.4* 1.3*  HGB 9.1* 9.3* 9.4* 8.8* 8.9*  HCT 27.2* 27.7* 28.7* 27.0* 27.1*  MCV  86.9 87.1 88.3 89.1 89.1  PLT 145* 182 211 204 619    Basic Metabolic Panel: Recent Labs  Lab 04/11/21 2157 04/12/21 0530 04/12/21 0537 04/13/21 0522 04/14/21 0614 04/14/21 1136 04/15/21 0500 04/16/21 0623  NA  --  136  --  138 137  --  137 137  K  --  3.7  --  3.5 3.3*  --  3.8 3.5  CL  --  100  --  100 99  --  103 102  CO2  --  27  --  28 29  --  28 29  GLUCOSE  --  104*  --  112* 102*  --  102* 116*  BUN  --  6*  --  6* 8  --  8 9  CREATININE  --  0.38*  --  0.35* 0.34*  --  0.40* 0.37*  CALCIUM  --  9.2  --  9.2 9.3  --  9.1 9.2  MG  --  1.2*  --  1.7 1.4*  --  1.4* 1.4*  PHOS 3.1  --  3.1  --   --  3.6 2.7 2.6    GFR: CrCl cannot be calculated (Unknown ideal weight.).  Liver Function Tests: Recent Labs  Lab 04/12/21 0530 04/13/21 0522 04/14/21 0614 04/15/21 0500 04/16/21 0623  AST 20 21 23 21 26   ALT 22 22 23 22 26   ALKPHOS 52 53 51 46 48  BILITOT 0.7 0.7 0.7 0.7 0.4  PROT 5.7* 6.0* 5.9* 5.7* 5.8*  ALBUMIN 2.6* 2.6* 2.8* 2.6* 2.7*    CBG: Recent Labs  Lab 04/14/21 1204 04/14/21 1733 04/15/21 0754 04/15/21 1200 04/15/21 1654  GLUCAP 100* 107* 100* 101* 127*     Recent Results (from the past 240 hour(s))  SARS CORONAVIRUS 2 (TAT 6-24 HRS) Nasopharyngeal Nasopharyngeal Swab     Status: None   Collection Time: 04/09/21  2:12 PM   Specimen: Nasopharyngeal Swab  Result Value Ref Range Status   SARS Coronavirus 2 NEGATIVE NEGATIVE Final    Comment: (NOTE) SARS-CoV-2 target nucleic acids are NOT DETECTED.  The SARS-CoV-2 RNA is generally detectable in upper and lower respiratory specimens during the acute phase of infection. Negative results do not preclude SARS-CoV-2 infection, do not rule out co-infections with other pathogens, and should not be used as the sole basis for treatment or other patient management decisions. Negative results must be combined with clinical observations, patient history, and epidemiological information. The  expected result is Negative.  Fact Sheet for Patients: SugarRoll.be  Fact Sheet for Healthcare Providers: https://www.woods-mathews.com/  This test is not yet approved or cleared by the [place] FDA and  has been authorized for detection and/or diagnosis of SARS-CoV-2 by FDA under an  Emergency Use Authorization (EUA). This EUA will remain  in effect (meaning this test can be used) for the duration of the COVID-19 declaration under Se ction 564(b)(1) of the Act, 21 U.S.C. section 360bbb-3(b)(1), unless the authorization is terminated or revoked sooner.  Performed at [facility], [place] [address], [place], [place] [address]   SARS CORONAVIRUS 2 (TAT 6-24 HRS) Nasopharyngeal Nasopharyngeal Swab     Status: None   Collection Time: 04/14/21  1:08 PM   Specimen: Nasopharyngeal Swab  Result Value Ref Range Status   SARS Coronavirus 2 NEGATIVE NEGATIVE Final    Comment: (NOTE) SARS-CoV-2 target nucleic acids are NOT DETECTED.  The SARS-CoV-2 RNA is generally detectable in upper and lower respiratory specimens during the acute phase of infection. Negative results do not preclude SARS-CoV-2 infection, do not rule out co-infections with other pathogens, and should not be used as the sole basis for treatment or other patient management decisions. Negative results must be combined with clinical observations, patient history, and epidemiological information. The expected result is Negative.  Fact Sheet for Patients: SugarRoll.be  Fact Sheet for Healthcare Providers: https://www.woods-mathews.com/  This test is not yet approved or cleared by the [place] FDA and  has been authorized for detection and/or diagnosis of SARS-CoV-2 by FDA under an Emergency Use Authorization (EUA). This EUA will remain  in effect (meaning this test can be used) for the duration of the COVID-19 declaration under  Se ction 564(b)(1) of the Act, 21 U.S.C. section 360bbb-3(b)(1), unless the authorization is terminated or revoked sooner.  Performed at [facility], [place] [address], [place], [place] [address]   CSF culture w Gram Stain     Status: None (Preliminary result)   Collection Time: 04/16/21  9:30 AM   Specimen: PATH Cytology CSF; Cerebrospinal Fluid  Result Value Ref Range Status   Specimen Description CSF  Final   Special Requests NONE  Final   Gram Stain   Final    NO ORGANISMS SEEN Gram Stain Report Called to,Read Back By and Verified With: MELISSA RN AT 2409 ON 04/16/21 BY S.VANHOORNE Performed at [facility], [place] [address] [place], [place] [address]    Culture PENDING  Incomplete   Report Status PENDING  Incomplete         Radiology Studies: DG FLUORO GUIDE LUMBAR PUNCTURE  Result Date: 04/16/2021 CLINICAL DATA:  Weakness. EXAM: DIAGNOSTIC LUMBAR PUNCTURE UNDER FLUOROSCOPIC GUIDANCE COMPARISON:  Lumbar spine MRI 04/11/2021. FLUOROSCOPY TIME:  Fluoroscopy Time:  24 seconds Radiation Exposure Index (if provided by the fluoroscopic device): 3 mGy Number of Acquired Spot Images: None PROCEDURE: Informed consent was obtained from the patient prior to the procedure, including Islay Polanco discussion of potential complications including but not limited to headache, allergy, pain, bleeding, infection, spinal cord/nerve root damage. With the patient prone, the lower back was prepped with Betadine. 1% Lidocaine was used for local anesthesia. Lumbar puncture was performed at the L3-L4 level using Cleaster Shiffer 20 gauge needle with return of clear CSF. 16.5 ml of CSF were obtained for laboratory studies. The patient tolerated the procedure well without immediate postprocedure complication. IMPRESSION: Technically successful fluoroscopically guided L3-L4 lumbar puncture. 16.5 mL of CSF obtained for laboratory studies. No immediate postprocedure complication. Electronically Signed   By: Kellie Simmering DO   On: 04/16/2021 09:33        Scheduled Meds:  carvedilol  12.5 mg Oral BID WC   chlorhexidine  15 mL Mouth/Throat QID  Chlorhexidine Gluconate Cloth  6 each Topical Daily   feeding supplement  237 mL Oral TID BM   FLUoxetine  20 mg Oral Daily   gabapentin  200 mg Oral BID   Gerhardt's butt cream   Topical BID   magic mouthwash w/lidocaine  2 mL Oral TID   magnesium oxide  800 mg Oral Daily   mouth rinse  15 mL Mouth Rinse q12n4p   melatonin  10 mg Oral QHS   mirtazapine  15 mg Oral QHS   multivitamin with minerals  1 tablet Oral Daily   nutrition supplement (JUVEN)  1 packet Oral BID WC   pantoprazole sodium  40 mg Oral BID   polyethylene glycol  17 g Oral Daily   potassium chloride  40 mEq Oral Daily   protein supplement  1 Scoop Oral TID WC   sucralfate  1 g Oral TID WC & HS   [START ON 04/18/2021] thiamine injection  100 mg Intravenous Daily   Continuous Infusions:  Immune Globulin 10%     thiamine injection 500 mg (04/16/21 0953)     LOS: 20 days    Time spent: over 30 min    Fayrene Helper, MD [facility]   To contact the attending provider between 7A-7P or the covering provider during after hours 7P-7A, please log into the web site [email or web address] and access using universal [place] password for that web site. If you do not have the password, please call the hospital operator.  04/16/2021, 5:21 PM

## 2021-04-17 LAB — PHOSPHORUS: Phosphorus: 3.3 mg/dL (ref 2.5–4.6)

## 2021-04-17 LAB — COMPREHENSIVE METABOLIC PANEL
ALT: 25 U/L (ref 0–44)
AST: 24 U/L (ref 15–41)
Albumin: 2.5 g/dL — ABNORMAL LOW (ref 3.5–5.0)
Alkaline Phosphatase: 46 U/L (ref 38–126)
Anion gap: 7 (ref 5–15)
BUN: 8 mg/dL (ref 8–23)
CO2: 27 mmol/L (ref 22–32)
Calcium: 9 mg/dL (ref 8.9–10.3)
Chloride: 100 mmol/L (ref 98–111)
Creatinine, Ser: 0.37 mg/dL — ABNORMAL LOW (ref 0.44–1.00)
GFR, Estimated: >60 mL/min (ref >60)
Glucose, Bld: 104 mg/dL — ABNORMAL HIGH (ref 70–99)
Potassium: 3.4 mmol/L — ABNORMAL LOW (ref 3.5–5.1)
Sodium: 134 mmol/L — ABNORMAL LOW (ref 135–145)
Total Bilirubin: 0.7 mg/dL (ref 0.3–1.2)
Total Protein: 6.5 g/dL (ref 6.5–8.1)

## 2021-04-17 LAB — CBC WITH DIFFERENTIAL/PLATELET
Abs Immature Granulocytes: 0.03 10*3/uL (ref 0.00–0.07)
Basophils Absolute: 0 10*3/uL (ref 0.0–0.1)
Basophils Relative: 1 %
Eosinophils Absolute: 0 10*3/uL (ref 0.0–0.5)
Eosinophils Relative: 1 %
HCT: 26.2 % — ABNORMAL LOW (ref 36.0–46.0)
Hemoglobin: 8.5 g/dL — ABNORMAL LOW (ref 12.0–15.0)
Immature Granulocytes: 1 %
Lymphocytes Relative: 47 %
Lymphs Abs: 1 10*3/uL (ref 0.7–4.0)
MCH: 29 pg (ref 26.0–34.0)
MCHC: 32.4 g/dL (ref 30.0–36.0)
MCV: 89.4 fL (ref 80.0–100.0)
Monocytes Absolute: 0.3 10*3/uL (ref 0.1–1.0)
Monocytes Relative: 12 %
Neutro Abs: 0.8 10*3/uL — ABNORMAL LOW (ref 1.7–7.7)
Neutrophils Relative %: 38 %
Platelets: 189 10*3/uL (ref 150–400)
RBC: 2.93 MIL/uL — ABNORMAL LOW (ref 3.87–5.11)
RDW: 18.2 % — ABNORMAL HIGH (ref 11.5–15.5)
WBC: 2.2 10*3/uL — ABNORMAL LOW (ref 4.0–10.5)
nRBC: 0 % (ref 0.0–0.2)

## 2021-04-17 LAB — CYTOLOGY - NON PAP

## 2021-04-17 LAB — VDRL, CSF: VDRL Quant, CSF: NONREACTIVE

## 2021-04-17 LAB — MAGNESIUM: Magnesium: 1.3 mg/dL — ABNORMAL LOW (ref 1.7–2.4)

## 2021-04-17 NOTE — Progress Notes (Signed)
Physical Therapy Treatment Patient Details Name: Yvette Roberts MRN: [id number] DOB: June 03, 1956 Today's Date: 04/17/2021    History of Present Illness Yvette Roberts is a 65 year old female presents with severe odynophagia the resultant minimal intake and profound weight loss. EGD on 03/29/21 which showed mild nonsevere esophagitis. PMH: hypertension, COPD, diabetes, stage IV uterine cancer, history of malignant pleural effusion diagnosed in 2/22.    PT Comments    Patient remains limited by profound weakness. She was able to initiate bil LE hip abd/add in supine and sitting this session and noted to have improved postural strength sitting EOB and maintained upright posture with bil UE's and min guard for safety. Attempted 1x pull up with Stedy as pt reported attempt earlier with RN staff however pt unable to fully rise with 2+ Max assist. PT fatigued and after return to supine requesting to rest. MaxiSky sling brought into room for RN/NT staff to use with transfer bed<>chair later this date. Acute PT will continue to progress as able.    Follow Up Recommendations  SNF     Equipment Recommendations  None recommended by PT    Recommendations for Other Services       Precautions / Restrictions Precautions Precautions: Fall Precaution Comments: B LE  paresis (profound weakness) Restrictions Weight Bearing Restrictions: No    Mobility  Bed Mobility Overal bed mobility: Needs Assistance Bed Mobility: Supine to Sit;Sit to Supine     Supine to sit: Total assist;Max assist;+2 for physical assistance;+2 for safety/equipment;HOB elevated Sit to supine: Max assist;+2 for physical assistance;+2 for safety/equipment;Total assist        Transfers Overall transfer level: Needs assistance   Transfers: Sit to/from Stand           General transfer comment: pt reports attempting Stedy this AM with RN staff to transfer bed>chair. Attempted 1x with Max/Total +2 assist from elevated EOB  but unable to fully rise. Cues/assist needed for hand placement and pt with decreased grip strength. Returned to supine after attempt as pt complained of arms feeling "pulled" and weak.  Ambulation/Gait                 Stairs             Wheelchair Mobility    Modified Rankin (Stroke Patients Only)       Balance Overall balance assessment: History of Falls;Needs assistance Sitting-balance support: Feet supported;Bilateral upper extremity supported Sitting balance-Leahy Scale: Poor Sitting balance - Comments: mod assist initially progressing to min guard with intermittent assist to maintain balance EOB. Postural control: Posterior lean     Standing balance comment: unsafe to test                            Cognition Arousal/Alertness: Awake/alert Behavior During Therapy: WFL for tasks assessed/performed Overall Cognitive Status: Impaired/Different from baseline Area of Impairment: Memory                 Orientation Level: Disoriented to;Time;Situation   Memory: Decreased short-term memory Following Commands: Follows one step commands inconsistently       General Comments: Diffiulcty with certain commands: Ex: stated year when asked current month. Then asked, "What do you want?" When asked to state the current month again, pt responded, "[employer]      Exercises General Exercises - Lower Extremity Hip ABduction/ADduction: AAROM;Both;5 reps;Supine Other Exercises Other Exercises: Attempted LAQ and hamstring curl through partial ROM in sitting  EOB, pt with 1/5 for muscle strength/activation. pt completed 5x more hip abduction/adduction sitting EOB with isometric holds of ~3 seconds.    General Comments        Pertinent Vitals/Pain Pain Assessment: No/denies pain Faces Pain Scale: Hurts a little bit Pain Location: buttocks with bed mobility, UE's with attempt at [place] / Indicators:  Burning;Tender;Sore Pain Intervention(s): Limited activity within patient's tolerance;Monitored during session;Repositioned;Ice applied    Home Living                      Prior Function            PT Goals (current goals can now be found in the care plan section) Acute Rehab PT Goals Patient Stated Goal: return home PT Goal Formulation: With patient Time For Goal Achievement: 05/01/21 Potential to Achieve Goals: Fair Progress towards PT goals: Progressing toward goals    Frequency    Min 2X/week      PT Plan Current plan remains appropriate    Co-evaluation              AM-PAC PT "6 Clicks" Mobility   Outcome Measure  Help needed turning from your back to your side while in a flat bed without using bedrails?: Total Help needed moving from lying on your back to sitting on the side of a flat bed without using bedrails?: Total Help needed moving to and from a bed to a chair (including a wheelchair)?: Total Help needed standing up from a chair using your arms (e.g., wheelchair or bedside chair)?: Total Help needed to walk in hospital room?: Total Help needed climbing 3-5 steps with a railing? : Total 6 Click Score: 6    End of Session Equipment Utilized During Treatment: Gait belt Activity Tolerance: Patient tolerated treatment well;Other (comment) (limited by profound weakness) Patient left: in bed;with call bell/phone within reach;with bed alarm set Nurse Communication: Mobility status;Need for lift equipment (maxisky sling in room) PT Visit Diagnosis: Other abnormalities of gait and mobility (R26.89);Muscle weakness (generalized) (M62.81)     Time: 0165-5374 PT Time Calculation (min) (ACUTE ONLY): 28 min  Charges:  $Therapeutic Activity: 23-37 mins                     Verner Mould, DPT Acute Rehabilitation Services Office [phone] Pager [phone]    Jacques Navy 04/17/2021, 10:44 AM

## 2021-04-17 NOTE — Progress Notes (Signed)
PROGRESS NOTE    Yvette Roberts  TTS:[id number] DOB: 1955-11-28 DOA: 03/27/2021 PCP: Yvette Hatchet, FNP   No chief complaint on file.  Brief Narrative:  65 year old African-American female with Yvette Roberts past medical history metastatic uterine cancer, type 2 diabetes, hypertension malignant pleural effusion admitted with odynophagia and weight loss.  She reports losing 50 to 60 pounds in the last 3 months.  She underwent further work-up and had an EGD and found nonsevere esophagitis in the proximal and distal esophagus as well as scattered mild inflammation with erythema in the entire stomach.  She's been started on protonix and sucralfate.  Hospitalization complicated by febrile neutropenia and pseudomonas UTI, now s/p abx.   Assessment & Plan:   Principal Problem:   Esophagitis Active Problems:   Type 2 diabetes mellitus without complication (HCC)   Hypertension   Uterine cancer (HCC)   Malignant pleural effusion   Protein-calorie malnutrition, severe (HCC)   Odynophagia   Decreased urine output   Pressure injury of skin  Bilateral Lower Extremity Weakness Yvette Roberts notes weakness worse since being in hospital (says she walked into hospital with walker) - therapy notes significantly weaker as well.  Weaker grip strength/upper extremities noted as well.  Unable to fully extend 3-5th fingers.     ? Critical myopathy, per discussion with oncology - B12 wnl Follow thiamine (not drawn unfortunately)- give high dose thiamine  MRI L spine -> no regional metastatic disease, small focus of edema and enhancement at the inferior endplate of S92 favored to be discogenic in nature.  Multilevel degenerative disc disease and degenerative facet disease throughout the lumbar region.   MRI brain, C, T spine without acute abnormality (see reports) Neurology c/s, greatly appreciate their assistance - concerned for paraneoplastic polyradiculoneuropathy, CIDP, critical illness neuropathy  S/p LP -> 1  WBC, protein wnl, glucose wnl, RBC's 0.  Gram stain without organisms, pending culture.   - follow HSV, ologoclonal bands, VDRL, cytology (no malignant cells), IgG CSF index Follow paraneoplastic panel Planning for IVIG 6/20 - 6/25 (5 days) If unrevealing, would follow outpatient EMG/NCS  Odynophagia and Esophagitis  Gastritis GERD -GI was consulted and underwent EGD on 03/29/21 which showed mild nonsevere esophagitis in the proximal and distal esophagus as well as scattered mild inflammation with erythema in the entire stomach but she had profound symptoms of Esophagitis -Her Bx -biopsy from the stomach shows reactive gastropathy and negative GI H. pylori.  No metaplasia dysplasia or malignancy.  Biopsy from the esophagus shows mild acute esophagitis negative for fungus.  No metaplasia or dysplasia or malignancy. Continue oxycodone 10 mg q6 prn for pain -GI recommending continuing Pantoprazole BID and Carafate  1 gram po TIDwm and qHS   -Patient to follow up with GI in the outpatient setting   Febrile Neutropenia-no further fevers she finished [name] course of cefepime for 7 days.  Cefepime was stopped on 04/07/2021.    Pancytopenia- Hb relatively stable today --- s/p transfusion 04/09/2021.     Pseudomonas Aeruginosa UTI finished [name] course of cefepime.    Hypokalemia  Replace and follow   Hypomagnesemia  Replace and follow   Hypophosphatemia improved    Refeeding syndrome monitor electrolytes  intermittently outpatient    Severe Protein Calorie Malnutrition -Nutritionist was consulted -she has just started to eat will need to monitor for refeeding syndrome.     Metastatic Uterine Cancer-followed by oncology holding off on further chemo at this time.  Oncology signed off. Oncology planning to follow  outpatient in about 1 month   Malignant Pleural Effusion -CT Scan showed "Loculated RIGHT-sided pleural fluid with 6.5 x 3.6 cm axial dimension as compared to 8.1 x 3.9 cm. Basilar volume  loss and signs of pleural thickening with decreased pleural thickening, markedly diminished since the prior PET and with continued decreased pleural thickening particularly in the inferior RIGHT chest even since the recent CT of the chest. Over the RIGHT hemidiaphragm pleural thickness approximately 7 mm as compared to 12 mm greatest thickness. LEFT chest is clear. Airways are patent." with the impression being "Decreased loculated pleural fluid and pleural thickening in the chest follows diminishing perihepatic soft tissue and capsular hepatic implants as described" -No Respiratory Distress Follow outpatient with oncology   Hypertension Continue coreg   Diabetes Mellitus Type 2 Follow outpatient A1c 6.1   Anxiety and Depression -C/w Mirtazapine 50 mg p.o. nightly and Fluoxetine 20 mg p.o. daily  Pressure Ulcer Frequent turns Pressure Injury 04/05/21 Sacrum Posterior;Medial;Lower Stage 2 -  Partial thickness loss of dermis presenting as [name] shallow open injury with [name] red, pink wound bed without slough. Appears to be stage two, bleeding right inside of buttocks and upper part of (Active)  04/05/21 2030  Location: Sacrum  Location Orientation: Posterior;Medial;Lower  Staging: Stage 2 -  Partial thickness loss of dermis presenting as [name] shallow open injury with Genise Strack red, pink wound bed without slough.  Wound Description (Comments): Appears to be stage two, bleeding right inside of buttocks and upper part of bottom  Present on Admission: No   S/p covid vaccine 6/14  DVT prophylaxis:lovenox Code Status: full  Family Communication: son, daughter 6/18 Disposition:   Status is: Inpatient  Remains inpatient appropriate because:Inpatient level of care appropriate due to severity of illness  Dispo: The patient is from: Home              Anticipated d/c is to: Home              Patient currently is not medically stable to d/c.   Difficult to place patient No       Consultants:   Oncology GI  Procedures:  EGD - Non-severe esophagitis. Biopsied. - Gastritis. Biopsied. - Normal duodenal bulb, first portion of the duodenum and second portion of the duodenum.    Antimicrobials: Anti-infectives (From admission, onward)    Start     Dose/Rate Route Frequency Ordered Stop   04/01/21 0930  ceFEPIme (MAXIPIME) 2 g in sodium chloride 0.9 % 100 mL IVPB  Status:  Discontinued        2 g 200 mL/hr over 30 Minutes Intravenous Every 8 hours 04/01/21 0839 04/07/21 1345   03/27/21 1800  fluconazole (DIFLUCAN) IVPB 200 mg  Status:  Discontinued        200 mg 100 mL/hr over 60 Minutes Intravenous Daily 03/27/21 1734 03/29/21 1633          Subjective: No new complaints today  Objective: Vitals:   04/17/21 0057 04/17/21 0244 04/17/21 0513 04/17/21 1402  BP: 130/81 136/81 117/71 131/86  Pulse: 81 83 91 93  Resp: 16 16 18 16   Temp: 98 F (36.7 C) 97.8 F (36.6 C) 98.6 F (37 C) 99.4 F (37.4 C)  TempSrc: Oral Oral Oral Oral  SpO2: 100% 100% 98% 100%  Weight:      Height:        Intake/Output Summary (Last 24 hours) at 04/17/2021 1602 Last data filed at 04/17/2021 1400 Gross per 24 hour  Intake  576.42 ml  Output 1550 ml  Net -973.58 ml   Filed Weights   04/16/21 1720  Weight: 75.5 kg    Examination:  General: No acute distress. Cardiovascular: Heart sounds show Bettyjean Stefanski regular rate, and rhythm.  Lungs: Clear to auscultation bilaterally  Abdomen: Soft, nontender, nondistended Neurological: Alert and oriented 3. Bilateral lower extremity weakness, bilateral foot drop.   Extremities: No clubbing or cyanosis. No edema    Data Reviewed: I have personally reviewed following labs and imaging studies  CBC: Recent Labs  Lab 04/13/21 0522 04/14/21 0614 04/15/21 0500 04/16/21 0623 04/17/21 0639  WBC 3.9* 3.4* 3.2* 3.1* 2.2*  NEUTROABS 2.3 1.6* 1.4* 1.3* 0.8*  HGB 9.3* 9.4* 8.8* 8.9* 8.5*  HCT 27.7* 28.7* 27.0* 27.1* 26.2*  MCV 87.1 88.3 89.1 89.1  89.4  PLT 182 211 204 195 742    Basic Metabolic Panel: Recent Labs  Lab 04/12/21 0537 04/13/21 0522 04/14/21 0614 04/14/21 1136 04/15/21 0500 04/16/21 0623 04/17/21 0639  NA  --  138 137  --  137 137 134*  K  --  3.5 3.3*  --  3.8 3.5 3.4*  CL  --  100 99  --  103 102 100  CO2  --  28 29  --  28 29 27   GLUCOSE  --  112* 102*  --  102* 116* 104*  BUN  --  6* 8  --  8 9 8   CREATININE  --  0.35* 0.34*  --  0.40* 0.37* 0.37*  CALCIUM  --  9.2 9.3  --  9.1 9.2 9.0  MG  --  1.7 1.4*  --  1.4* 1.4* 1.3*  PHOS 3.1  --   --  3.6 2.7 2.6 3.3    GFR: Estimated Creatinine Clearance: 72.8 mL/min (Kalin Amrhein) (by C-G formula based on SCr of 0.37 mg/dL (L)).  Liver Function Tests: Recent Labs  Lab 04/13/21 0522 04/14/21 5956 04/15/21 0500 04/16/21 0623 04/17/21 0639  AST 21 23 21 26 24   ALT 22 23 22 26 25   ALKPHOS 53 51 46 48 46  BILITOT 0.7 0.7 0.7 0.4 0.7  PROT 6.0* 5.9* 5.7* 5.8* 6.5  ALBUMIN 2.6* 2.8* 2.6* 2.7* 2.5*    CBG: Recent Labs  Lab 04/14/21 1204 04/14/21 1733 04/15/21 0754 04/15/21 1200 04/15/21 1654  GLUCAP 100* 107* 100* 101* 127*     Recent Results (from the past 240 hour(s))  SARS CORONAVIRUS 2 (TAT 6-24 HRS) Nasopharyngeal Nasopharyngeal Swab     Status: None   Collection Time: 04/09/21  2:12 PM   Specimen: Nasopharyngeal Swab  Result Value Ref Range Status   SARS Coronavirus 2 NEGATIVE NEGATIVE Final    Comment: (NOTE) SARS-CoV-2 target nucleic acids are NOT DETECTED.  The SARS-CoV-2 RNA is generally detectable in upper and lower respiratory specimens during the acute phase of infection. Negative results do not preclude SARS-CoV-2 infection, do not rule out co-infections with other pathogens, and should not be used as the sole basis for treatment or other patient management decisions. Negative results must be combined with clinical observations, patient history, and epidemiological information. The expected result is Negative.  Fact Sheet for  Patients: SugarRoll.be  Fact Sheet for Healthcare Providers: https://www.woods-mathews.com/  This test is not yet approved or cleared by the [place] FDA and  has been authorized for detection and/or diagnosis of SARS-CoV-2 by FDA under an Emergency Use Authorization (EUA). This EUA will remain  in effect (meaning this test can be used) for the  duration of the COVID-19 declaration under Se ction 564(b)(1) of the Act, 21 U.S.C. section 360bbb-3(b)(1), unless the authorization is terminated or revoked sooner.  Performed at [facility], [place] [address], [place], [place] [address]   SARS CORONAVIRUS 2 (TAT 6-24 HRS) Nasopharyngeal Nasopharyngeal Swab     Status: None   Collection Time: 04/14/21  1:08 PM   Specimen: Nasopharyngeal Swab  Result Value Ref Range Status   SARS Coronavirus 2 NEGATIVE NEGATIVE Final    Comment: (NOTE) SARS-CoV-2 target nucleic acids are NOT DETECTED.  The SARS-CoV-2 RNA is generally detectable in upper and lower respiratory specimens during the acute phase of infection. Negative results do not preclude SARS-CoV-2 infection, do not rule out co-infections with other pathogens, and should not be used as the sole basis for treatment or other patient management decisions. Negative results must be combined with clinical observations, patient history, and epidemiological information. The expected result is Negative.  Fact Sheet for Patients: SugarRoll.be  Fact Sheet for Healthcare Providers: https://www.woods-mathews.com/  This test is not yet approved or cleared by the [place] FDA and  has been authorized for detection and/or diagnosis of SARS-CoV-2 by FDA under an Emergency Use Authorization (EUA). This EUA will remain  in effect (meaning this test can be used) for the duration of the COVID-19 declaration under Se ction 564(b)(1) of the Act, 21 U.S.C. section  360bbb-3(b)(1), unless the authorization is terminated or revoked sooner.  Performed at [facility], [place] [address] [place], [place] [address]   CSF culture w Gram Stain     Status: None (Preliminary result)   Collection Time: 04/16/21  9:30 AM   Specimen: PATH Cytology CSF; Cerebrospinal Fluid  Result Value Ref Range Status   Specimen Description   Final    CSF Performed at [place] [address], [place], [place] [address]    Special Requests   Final    NONE Performed at [facility], [place] [address] [place], [place] [address]    Gram Stain   Final    NO ORGANISMS SEEN Gram Stain Report Called to,Read Back By and Verified With: MELISSA RN AT 6578 ON 04/16/21 BY S.VANHOORNE Performed at [facility], [place] [address], [place], [place] [address]    Culture   Final    NO GROWTH < 24 HOURS Performed at [place] [address], [place], [place] [address]    Report Status PENDING  Incomplete         Radiology Studies: DG FLUORO GUIDE LUMBAR PUNCTURE  Result Date: 04/16/2021 CLINICAL DATA:  Weakness. EXAM: DIAGNOSTIC LUMBAR PUNCTURE UNDER FLUOROSCOPIC GUIDANCE COMPARISON:  Lumbar spine MRI 04/11/2021. FLUOROSCOPY TIME:  Fluoroscopy Time:  24 seconds Radiation Exposure Index (if provided by the fluoroscopic device): 3 mGy Number of Acquired Spot Images: None PROCEDURE: Informed consent was obtained from the patient prior to the procedure, including Cleaster Shiffer discussion of potential complications including but not limited to headache, allergy, pain, bleeding, infection, spinal cord/nerve root damage. With the patient prone, the lower back was prepped with Betadine. 1% Lidocaine was used for local anesthesia. Lumbar puncture was performed at the L3-L4 level using Ara Mano 20 gauge needle with return of clear CSF. 16.5 ml of CSF were obtained for laboratory studies. The patient tolerated the procedure well without immediate  postprocedure complication. IMPRESSION: Technically successful fluoroscopically guided L3-L4 lumbar puncture. 16.5 mL of CSF obtained for laboratory studies. No immediate postprocedure complication. Electronically Signed  By: Kellie Simmering DO   On: 04/16/2021 09:33        Scheduled Meds:  carvedilol  12.5 mg Oral BID WC   chlorhexidine  15 mL Mouth/Throat QID   Chlorhexidine Gluconate Cloth  6 each Topical Daily   feeding supplement  237 mL Oral TID BM   FLUoxetine  20 mg Oral Daily   gabapentin  200 mg Oral BID   Gerhardt's butt cream   Topical BID   magic mouthwash w/lidocaine  2 mL Oral TID   magnesium oxide  800 mg Oral Daily   mouth rinse  15 mL Mouth Rinse q12n4p   melatonin  10 mg Oral QHS   mirtazapine  15 mg Oral QHS   multivitamin with minerals  1 tablet Oral Daily   nutrition supplement (JUVEN)  1 packet Oral BID WC   pantoprazole sodium  40 mg Oral BID   polyethylene glycol  17 g Oral Daily   potassium chloride  40 mEq Oral Daily   protein supplement  1 Scoop Oral TID WC   sucralfate  1 g Oral TID WC & HS   [START ON 04/18/2021] thiamine injection  100 mg Intravenous Daily   Continuous Infusions:  Immune Globulin 10% Stopped (04/17/21 0210)     LOS: 21 days    Time spent: over 30 min    Fayrene Helper, MD [facility]   To contact the attending provider between 7A-7P or the covering provider during after hours 7P-7A, please log into the web site [email or web address] and access using universal [place] password for that web site. If you do not have the password, please call the hospital operator.  04/17/2021, 4:02 PM

## 2021-04-17 NOTE — Progress Notes (Signed)
Nutrition Follow-up  DOCUMENTATION CODES:   Not applicable  INTERVENTION:  -Continue Ensure Enlive po TID, each supplement provides 350 kcal and 20 grams of protein  -Continue Juven Fruit Punch BID, each serving provides 95kcal and 2.5g of protein (amino acids glutamine and arginine)  -Continue Beneprotein TID with meals, each provides 25 kcals and 6g protein  NUTRITION DIAGNOSIS:   Inadequate oral intake related to cancer and cancer related treatments, decreased appetite (odynophagia) as evidenced by per patient/family report.  ongoing  GOAL:   Patient will meet greater than or equal to 90% of their needs  progressing  MONITOR:   PO intake, Supplement acceptance, Labs, Weight trends, I & O's  REASON FOR ASSESSMENT:   Consult Assessment of nutrition requirement/status, Calorie Count  ASSESSMENT:   65 year old African-American female with a past medical history significant for but not limited to hypertension, type 2 diabetes mellitus, stage IV uterine cancer, history malignant pleural effusion diagnosed in February 2022 followed by Dr. Simeon Craft such currently on chemotherapy who had multiple symptoms most notably severe odynophagia the resultant minimal intake and profound weight loss.  6/20 s/p L3-L4 LP, 16.47ml CSF obtained for lab studies; IVIG started  Pt's intake remains inadequate; last 7 recorded meals documented as 0-50% completion (11% average meal intake). Pt with orders for the following supplements: Juven, Ensure Enlive, Beneprotein. Per RN, pt typically does well with supplements but does intermittently decline   Pt's weight was obtained; pt weighs 75.5 kg. This shows only a 1.8% weight loss x1 month (insignificant for time frame), but a clinically significant weight loss of 22.9% x3 months. Note pt with non-pitting edema to BLE which may be masking weight loss.  UOP: 1641ml x24 hours I/O: +17104ml since admit  Medications: mag-ox, remeron, protonix, miralax,  klor-con, carafate, thiamine, mvi with minerals Labs: Na 134 (L), K+ 3.4 (L), Mg 1.3 (L)  Diet Order:   Diet Order             DIET SOFT           Diet - low sodium heart healthy           DIET SOFT Room service appropriate? Yes; Fluid consistency: Thin  Diet effective now                   EDUCATION NEEDS:   No education needs have been identified at this time  Skin:  Skin Assessment: Skin Integrity Issues: Skin Integrity Issues:: Stage II Stage II: sacrum  Last BM:  6/18  Height:   Ht Readings from Last 1 Encounters:  04/10/21 5\' 6"  (1.676 m)    Weight:   Wt Readings from Last 1 Encounters:  04/16/21 75.5 kg   BMI:  Body mass index is 26.87 kg/m.  Estimated Nutritional Needs:   Kcal:  2100-2300  Protein:  105-115g  Fluid:  2.1L/day    Larkin Ina, MS, RD, LDN Pronouns: She/Her/Hers RD pager number and weekend/on-call pager number located in [place].

## 2021-04-18 DIAGNOSIS — R531 Weakness: Secondary | ICD-10-CM

## 2021-04-18 LAB — CBC WITH DIFFERENTIAL/PLATELET
Abs Immature Granulocytes: 0.03 10*3/uL (ref 0.00–0.07)
Basophils Absolute: 0 10*3/uL (ref 0.0–0.1)
Basophils Relative: 0 %
Eosinophils Absolute: 0.1 10*3/uL (ref 0.0–0.5)
Eosinophils Relative: 2 %
HCT: 27.3 % — ABNORMAL LOW (ref 36.0–46.0)
Hemoglobin: 8.8 g/dL — ABNORMAL LOW (ref 12.0–15.0)
Immature Granulocytes: 1 %
Lymphocytes Relative: 32 %
Lymphs Abs: 1.1 10*3/uL (ref 0.7–4.0)
MCH: 28.9 pg (ref 26.0–34.0)
MCHC: 32.2 g/dL (ref 30.0–36.0)
MCV: 89.5 fL (ref 80.0–100.0)
Monocytes Absolute: 0.6 10*3/uL (ref 0.1–1.0)
Monocytes Relative: 16 %
Neutro Abs: 1.7 10*3/uL (ref 1.7–7.7)
Neutrophils Relative %: 49 %
Platelets: 194 10*3/uL (ref 150–400)
RBC: 3.05 MIL/uL — ABNORMAL LOW (ref 3.87–5.11)
RDW: 18.3 % — ABNORMAL HIGH (ref 11.5–15.5)
WBC: 3.4 10*3/uL — ABNORMAL LOW (ref 4.0–10.5)
nRBC: 0 % (ref 0.0–0.2)

## 2021-04-18 LAB — COMPREHENSIVE METABOLIC PANEL
ALT: 24 U/L (ref 0–44)
AST: 22 U/L (ref 15–41)
Albumin: 2.6 g/dL — ABNORMAL LOW (ref 3.5–5.0)
Alkaline Phosphatase: 47 U/L (ref 38–126)
Anion gap: 6 (ref 5–15)
BUN: 6 mg/dL — ABNORMAL LOW (ref 8–23)
CO2: 29 mmol/L (ref 22–32)
Calcium: 9.2 mg/dL (ref 8.9–10.3)
Chloride: 99 mmol/L (ref 98–111)
Creatinine, Ser: 0.39 mg/dL — ABNORMAL LOW (ref 0.44–1.00)
GFR, Estimated: >60 mL/min (ref >60)
Glucose, Bld: 106 mg/dL — ABNORMAL HIGH (ref 70–99)
Potassium: 3.4 mmol/L — ABNORMAL LOW (ref 3.5–5.1)
Sodium: 134 mmol/L — ABNORMAL LOW (ref 135–145)
Total Bilirubin: 0.6 mg/dL (ref 0.3–1.2)
Total Protein: 7.3 g/dL (ref 6.5–8.1)

## 2021-04-18 LAB — MISC LABCORP TEST (SEND OUT)
Labcorp test code: 9985
Labcorp test code: 99885

## 2021-04-18 LAB — OLIGOCLONAL BANDS, CSF + SERM

## 2021-04-18 LAB — MAGNESIUM: Magnesium: 1.3 mg/dL — ABNORMAL LOW (ref 1.7–2.4)

## 2021-04-18 LAB — PHOSPHORUS: Phosphorus: 3.7 mg/dL (ref 2.5–4.6)

## 2021-04-18 MED ORDER — MAGNESIUM SULFATE 4 GM/100ML IV SOLN
4.0000 g | Freq: Once | INTRAVENOUS | Status: DC
  Filled 2021-04-18: qty 100

## 2021-04-18 MED ORDER — PANTOPRAZOLE SODIUM 40 MG PO TBEC
40.0000 mg | DELAYED_RELEASE_TABLET | Freq: Two times a day (BID) | ORAL | Status: DC
  Administered 2021-04-18 – 2021-04-28 (×20): 40 mg via ORAL
  Filled 2021-04-18 (×20): qty 1

## 2021-04-18 MED ORDER — THIAMINE HCL 100 MG PO TABS
100.0000 mg | ORAL_TABLET | Freq: Every day | ORAL | Status: DC
  Administered 2021-04-19 – 2021-04-28 (×10): 100 mg via ORAL
  Filled 2021-04-18 (×10): qty 1

## 2021-04-18 MED ORDER — FLUOXETINE HCL 20 MG PO CAPS
20.0000 mg | ORAL_CAPSULE | Freq: Every day | ORAL | Status: DC
  Administered 2021-04-19 – 2021-04-28 (×10): 20 mg via ORAL
  Filled 2021-04-18 (×10): qty 1

## 2021-04-18 MED ORDER — MAGNESIUM SULFATE 4 GM/100ML IV SOLN
4.0000 g | Freq: Once | INTRAVENOUS | Status: AC
  Administered 2021-04-18: 4 g via INTRAVENOUS
  Filled 2021-04-18: qty 100

## 2021-04-18 MED ORDER — MAGNESIUM SULFATE 2 GM/50ML IV SOLN
2.0000 g | Freq: Once | INTRAVENOUS | Status: AC
  Administered 2021-04-18: 2 g via INTRAVENOUS
  Filled 2021-04-18: qty 50

## 2021-04-18 MED ORDER — POTASSIUM CHLORIDE CRYS ER 20 MEQ PO TBCR
40.0000 meq | EXTENDED_RELEASE_TABLET | Freq: Once | ORAL | Status: AC
  Administered 2021-04-18: 40 meq via ORAL
  Filled 2021-04-18: qty 2

## 2021-04-18 NOTE — Progress Notes (Signed)
Occupational Therapy Treatment Patient Details Name: Yvette Roberts MRN: [id number] DOB: 07/25/1956 Today's Date: 04/18/2021    History of present illness Yvette Roberts is a 65 year old female presents with severe odynophagia the resultant minimal intake and profound weight loss. EGD on 03/29/21 which showed mild nonsevere esophagitis. PMH: hypertension, COPD, diabetes, stage IV uterine cancer, history of malignant pleural effusion diagnosed in 2/22.   OT comments  Upon arrival patient talking with nursing assistant stating she is going home Friday, yet later in session when OT brought up rehab patient states "I may go Friday." Patient demonstrates cognitive deficits throughout treatment needing cues to initiate directions. Patient also became anxious once seated on stedy asking to sit needing max reassurance that she is sitting and to maintain posture as patient leans anteriorly. Patient needing mod x2 assist to stand from elevated bed height in order to stand in stedy and transfer to recliner. Placed lift pad in chair and notified nursing assistant to use lift if patient unable to use stedy back to bed.    Follow Up Recommendations  SNF    Equipment Recommendations  Other (comment) (defer to next venue)       Precautions / Restrictions Precautions Precautions: Fall Precaution Comments: B LE  paresis (profound weakness)       Mobility Bed Mobility Overal bed mobility: Needs Assistance Bed Mobility: Supine to Sit     Supine to sit: Max assist;+2 for physical assistance;+2 for safety/equipment;HOB elevated     General bed mobility comments: provide verbal cues to assist therapy to reach for bed rails, needs max multimodal cues, limited initiation    Transfers Overall transfer level: Needs assistance   Transfers: Sit to/from Stand;Stand Pivot Transfers Sit to Stand: Mod assist;+2 physical assistance;+2 safety/equipment;From elevated surface Stand pivot transfers: Total assist        General transfer comment: providing cues for body mechanics, patient able to assist with upper extremities to pull on stedy and with mod x2 able to stand from elevated bed height. total A to pivot to recliner with stedy, patient becomes anxious asking to sit while on Stedy, tried to redirect and reassure patient but leans anteriorly and states "it doesn't feel like im sitting"    Balance Overall balance assessment: History of Falls;Needs assistance Sitting-balance support: Feet supported Sitting balance-Leahy Scale: Fair Sitting balance - Comments: with increased time and cues to keep hands in lap patient is able to maintain static sitting for ~15 seconds before placing hands back on bed however does not appear to be due to loss of balance, needing cues to redirect   Standing balance support: Bilateral upper extremity supported Standing balance-Leahy Scale: Zero Standing balance comment: use of stedy                           ADL either performed or assessed with clinical judgement   ADL Overall ADL's : Needs assistance/impaired                         Toilet Transfer: Total assistance (stedy) Toilet Transfer Details (indicate cue type and reason): patient needing mod x2 to power up to standing from elevated bed height with cues for body mechanics then sat onto stedy. patient becomes anxious asking if she can sit, attempt to reassure she is sitting on stedy but patient continues to state "hurry up" with anterior lean  Cognition Arousal/Alertness: Awake/alert Behavior During Therapy: WFL for tasks assessed/performed Overall Cognitive Status: Impaired/Different from baseline Area of Impairment: Following commands;Problem solving                       Following Commands: Follows one step commands inconsistently;Follows one step commands with increased time     Problem Solving: Requires verbal cues;Requires tactile cues;Slow  processing General Comments: needs repetitive cues, increased time to intiate tasks                   Pertinent Vitals/ Pain       Pain Assessment: Faces Faces Pain Scale: Hurts little more Pain Location: UEs with use of stedy Pain Descriptors / Indicators: Grimacing Pain Intervention(s): Monitored during session         Frequency  Min 2X/week        Progress Toward Goals  OT Goals(current goals can now be found in the care plan section)  Progress towards OT goals: Progressing toward goals  Acute Rehab OT Goals Patient Stated Goal: return home OT Goal Formulation: With patient Time For Goal Achievement: 04/28/21 Potential to Achieve Goals: Fair ADL Goals Pt Will Transfer to Toilet: with transfer board;squat pivot transfer;with mod assist Pt/caregiver will Perform Home Exercise Program: Increased ROM;Increased strength;Both right and left upper extremity;With minimal assist  Plan Discharge plan remains appropriate       AM-PAC OT "6 Clicks" Daily Activity     Outcome Measure   Help from another person eating meals?: A Lot Help from another person taking care of personal grooming?: A Lot Help from another person toileting, which includes using toliet, bedpan, or urinal?: Total Help from another person bathing (including washing, rinsing, drying)?: Total Help from another person to put on and taking off regular upper body clothing?: A Lot Help from another person to put on and taking off regular lower body clothing?: Total 6 Click Score: 9    End of Session Equipment Utilized During Treatment: Gait belt  OT Visit Diagnosis: Hemiplegia and hemiparesis;History of falling (Z91.81);Muscle weakness (generalized) (M62.81);Other symptoms and signs involving cognitive function Hemiplegia - caused by: Unspecified   Activity Tolerance Patient tolerated treatment well   Patient Left in chair;with call bell/phone within reach;with chair alarm set   Nurse Communication  Mobility status;Need for lift equipment;Other (comment) (placed maxisky pad under patient in chair)        Time: 2035-5974 OT Time Calculation (min): 23 min  Charges: OT General Charges $OT Visit: 1 Visit OT Treatments $Self Care/Home Management : 23-37 mins  Delbert Phenix OT OT pager: [phone]   Rosemary Holms 04/18/2021, 1:26 PM

## 2021-04-18 NOTE — Progress Notes (Signed)
[facility]  PROGRESS NOTE  Yvette Roberts EGB:[id number] DOB: 1956/05/26 DOA: 03/27/2021 PCP: [name], FNP   Brief HPI:   65 year old African-American female with past medical history of metastatic uterine cancer, diabetes mellitus type 2, hypertension, malignant pleural effusion who was admitted with odynophagia and weight loss.  She reported 50 pound weight loss in past 3 months.  Also underwent further work-up had a EGD and found to have nonsevere esophagitis in proximal and distal esophagus as well as scattered mild inflammation with erythema in the entire stomach.  She was started on Protonix and sucralfate.  Hospitalization was complicated by febrile neutropenia and Pseudomonas UTI.  Patient is s/p antibiotics treatment.    Subjective   Patient seen and examined, still has bilateral lower extremity weakness.  Has been getting IVIG.   Assessment/Plan:    Bilateral lower extremity weakness -Unclear etiology -MRI lumbar spine showed no regional metastatic disease, showed small focus of edema and enhancement at the inferior endplate of P71 favored to be discogenic in nature.  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  Which showed 1 WBC, protein WNL, glucose WNL, RBC 0.  Gram stain showed no organisms.  Culture is pending. -Oligoclonal bands were negative -Follow HSV, VDRL, cytology, IgG CSF index -Follow-up paraneoplastic panel -If unrevealing she will need outpatient EMG/NCS   Odynophagia and esophagitis -Patient underwent EGD on 03/29/2021 which showed mild nonsevere esophagitis in the proximal and distal esophagus as well as scattered mild inflammation with erythema in the entire  stomach -Biopsy from stomach shows reactive gastropathy negative H. pylori. -No metaplasia dysplasia or pregnancy. -Biopsy from esophagus showed mild esophagitis, negative for fungus.  No dysplasia or metaplasia. -GI recommended continuing with Protonix twice daily and Carafate 1 g p.o. 3 times daily -Patient to follow-up with GI in the outpatient setting   Febrile neutropenia -Patient completed 7 days course of cefepime, cefepime was stopped on 04/07/2021 -No further fever noted   Pancytopenia -Stable  Hypokalemia -Potassium 3.4; replace potassium and follow BMP in am  Hypomagnesemia -Magnesium is 1.3 -Replace magnesium and follow mag level in a.m.  Severe protein calorie malnutrition -Dietitian was consulted -Patient has started to eat, will need to monitor for refeeding syndrome -Monitor electrolytes  Metastatic uterine cancer -Followed by oncology, holding of further chemotherapy at this time -Oncology has signed off -Follow-up oncology as outpatient in about 1 month  Hypertension -Blood pressure is stable -Continue Coreg  Diabetes mellitus type 2 -CBG well controlled with -Hemoglobin A1c 6.1 -Follow-up PCP as outpatient  Malignant pleural effusion -CT scan showed loculated right-sided pleural effusion with 6.5 x 3.6 cm axial dimension as compared to 8.1x 3.9 cm. Basilar volume loss and signs of pleural thickening with decreased pleural thickening, markedly diminished since the prior PET and with continued decreased pleural thickening particularly in the inferior RIGHT chest even since the recent CT of the chest.  -Over the RIGHT hemidiaphragm pleural thickness approximately 7 mm as compared to 12 mm greatest thickness. LEFT chest is clear. Airways are patent." with the impression being "Decreased loculated pleural fluid and pleural thickening in the chest follows diminishing perihepatic soft tissue and capsular hepatic implants as described" -No Respiratory  Distress Follow outpatient with oncology   Scheduled medications:    carvedilol  12.5 mg  Oral BID WC   chlorhexidine  15 mL Mouth/Throat QID   Chlorhexidine Gluconate Cloth  6 each Topical Daily   feeding supplement  237 mL Oral TID BM   [START ON 04/19/2021] FLUoxetine  20 mg Oral Daily   gabapentin  200 mg Oral BID   Gerhardt's butt cream   Topical BID   magnesium oxide  800 mg Oral Daily   mouth rinse  15 mL Mouth Rinse q12n4p   melatonin  10 mg Oral QHS   mirtazapine  15 mg Oral QHS   multivitamin with minerals  1 tablet Oral Daily   nutrition supplement (JUVEN)  1 packet Oral BID WC   pantoprazole  40 mg Oral BID   polyethylene glycol  17 g Oral Daily   potassium chloride  40 mEq Oral Daily   protein supplement  1 Scoop Oral TID WC   sucralfate  1 g Oral TID WC & HS   [START ON 04/19/2021] thiamine  100 mg Oral Daily         Data Reviewed:   CBG:  Recent Labs  Lab 04/14/21 1204 04/14/21 1733 04/15/21 0754 04/15/21 1200 04/15/21 1654  GLUCAP 100* 107* 100* 101* 127*    SpO2: 98 % O2 Flow Rate (L/min): 2 L/min    Vitals:   04/17/21 2220 04/18/21 0055 04/18/21 0544 04/18/21 1448  BP: 125/88 109/75 (!) 143/83 (!) 123/91  Pulse: 90 89 94 92  Resp: 16 16 16 14   Temp: 98.6 F (37 C) 98.6 F (37 C) 98.3 F (36.8 C) 98.3 F (36.8 C)  TempSrc: Oral Oral Oral Oral  SpO2: 96% 96% 98% 98%  Weight:      Height:         Intake/Output Summary (Last 24 hours) at 04/18/2021 1803 Last data filed at 04/18/2021 1452 Gross per 24 hour  Intake --  Output 1625 ml  Net -1625 ml    06/20 1901 - 06/22 0700 In: 576.4 [P.O.:120; I.V.:300] Out: 2750 [Urine:2750]  Filed Weights   04/16/21 1720  Weight: 75.5 kg    CBC:  Recent Labs  Lab 04/14/21 0614 04/15/21 0500 04/16/21 0623 04/17/21 0639 04/18/21 0557  WBC 3.4* 3.2* 3.1* 2.2* 3.4*  HGB 9.4* 8.8* 8.9* 8.5* 8.8*  HCT 28.7* 27.0* 27.1* 26.2* 27.3*  PLT 211 204 195 189 194  MCV 88.3 89.1 89.1 89.4 89.5   MCH 28.9 29.0 29.3 29.0 28.9  MCHC 32.8 32.6 32.8 32.4 32.2  RDW 18.1* 18.2* 18.2* 18.2* 18.3*  LYMPHSABS 1.3 1.4 1.3 1.0 1.1  MONOABS 0.5 0.5 0.4 0.3 0.6  EOSABS 0.0 0.0 0.0 0.0 0.1  BASOSABS 0.0 0.0 0.0 0.0 0.0    Complete metabolic panel:  Recent Labs  Lab 04/14/21 0614 04/15/21 0500 04/16/21 0623 04/17/21 0639 04/18/21 0557  NA 137 137 137 134* 134*  K 3.3* 3.8 3.5 3.4* 3.4*  CL 99 103 102 100 99  CO2 29 28 29 27 29   GLUCOSE 102* 102* 116* 104* 106*  BUN 8 8 9 8  6*  CREATININE 0.34* 0.40* 0.37* 0.37* 0.39*  CALCIUM 9.3 9.1 9.2 9.0 9.2  AST 23 21 26 24 22   ALT 23 22 26 25 24   ALKPHOS 51 46 48 46 47  BILITOT 0.7 0.7 0.4 0.7 0.6  ALBUMIN 2.8* 2.6* 2.7* 2.5* 2.6*  MG 1.4* 1.4* 1.4* 1.3* 1.3*    No results for input(s): LIPASE, AMYLASE in the last 168 hours.  Recent Labs  Lab 04/14/21 1308  SARSCOV2NAA NEGATIVE    ------------------------------------------------------------------------------------------------------------------ No results for input(s): CHOL, HDL, LDLCALC, TRIG, CHOLHDL, LDLDIRECT in the last 72 hours.  Lab Results  Component Value Date   HGBA1C 6.1 (H) 03/27/2021   ------------------------------------------------------------------------------------------------------------------ No results for input(s): TSH, T4TOTAL, T3FREE, THYROIDAB in the last 72 hours.  Invalid input(s): FREET3 ------------------------------------------------------------------------------------------------------------------ No results for input(s): VITAMINB12, FOLATE, FERRITIN, TIBC, IRON, RETICCTPCT in the last 72 hours.  Coagulation profile No results for input(s): INR, PROTIME in the last 168 hours. No results for input(s): DDIMER in the last 72 hours.  Cardiac Enzymes Recent Labs  Lab 04/12/21 0530  CKTOTAL 31*    ------------------------------------------------------------------------------------------------------------------    Component Value Date/Time    BNP 75.8 02/25/2021 1625     Antibiotics: Anti-infectives (From admission, onward)    Start     Dose/Rate Route Frequency Ordered Stop   04/01/21 0930  ceFEPIme (MAXIPIME) 2 g in sodium chloride 0.9 % 100 mL IVPB  Status:  Discontinued        2 g 200 mL/hr over 30 Minutes Intravenous Every 8 hours 04/01/21 0839 04/07/21 1345   03/27/21 1800  fluconazole (DIFLUCAN) IVPB 200 mg  Status:  Discontinued        200 mg 100 mL/hr over 60 Minutes Intravenous Daily 03/27/21 1734 03/29/21 1633        Radiology Reports  No results found.    DVT prophylaxis: Lovenox  Code Status: Full code  Family Communication: No family at bedside   Consultants: Neurology  Procedures: Lumbar puncture    Objective    Physical Examination:  General-appears in no acute distress Heart-S1-S2, regular, no murmur auscultated Lungs-clear to auscultation bilaterally, no wheezing or crackles auscultated Abdomen-soft, nontender, no organomegaly Extremities-no edema in the lower extremities Neuro-alert, oriented x3, motor strength 5/5 in upper extremities, 1/5 in lower extremities   Status is: Inpatient  Dispo: The patient is from: Home              Anticipated d/c is to: Skilled nursing facility              Anticipated d/c date is: 04/20/2021              Patient currently not stable for discharge  Barrier to discharge-getting IVIG  COVID-19 Labs  No results for input(s): DDIMER, FERRITIN, LDH, CRP in the last 72 hours.  Lab Results  Component Value Date   [place] NEGATIVE 04/14/2021   [place] NEGATIVE 04/09/2021   [place] NEGATIVE 03/28/2021   [place] NEGATIVE 11/23/2020    Microbiology  Recent Results (from the past 240 hour(s))  SARS CORONAVIRUS 2 (TAT 6-24 HRS) Nasopharyngeal Nasopharyngeal Swab     Status: None   Collection Time: 04/09/21  2:12 PM   Specimen: Nasopharyngeal Swab  Result Value Ref Range Status   SARS Coronavirus 2 NEGATIVE NEGATIVE Final     Comment: (NOTE) SARS-CoV-2 target nucleic acids are NOT DETECTED.  The SARS-CoV-2 RNA is generally detectable in upper and lower respiratory specimens during the acute phase of infection. Negative results do not preclude SARS-CoV-2 infection, do not rule out co-infections with other pathogens, and should not be used as the sole basis for treatment or other patient management decisions. Negative results must be combined with clinical observations, patient history, and epidemiological information. The expected result is Negative.  Fact Sheet for Patients: SugarRoll.be  Fact Sheet for Healthcare Providers: https://www.woods-mathews.com/  This test is not yet approved or cleared by the [place] FDA and  has been authorized for  detection and/or diagnosis of SARS-CoV-2 by FDA under an Emergency Use Authorization (EUA). This EUA will remain  in effect (meaning this test can be used) for the duration of the COVID-19 declaration under Se ction 564(b)(1) of the Act, 21 U.S.C. section 360bbb-3(b)(1), unless the authorization is terminated or revoked sooner.  Performed at [facility], [place] [address] [place], [place] [address]   SARS CORONAVIRUS 2 (TAT 6-24 HRS) Nasopharyngeal Nasopharyngeal Swab     Status: None   Collection Time: 04/14/21  1:08 PM   Specimen: Nasopharyngeal Swab  Result Value Ref Range Status   SARS Coronavirus 2 NEGATIVE NEGATIVE Final    Comment: (NOTE) SARS-CoV-2 target nucleic acids are NOT DETECTED.  The SARS-CoV-2 RNA is generally detectable in upper and lower respiratory specimens during the acute phase of infection. Negative results do not preclude SARS-CoV-2 infection, do not rule out co-infections with other pathogens, and should not be used as the sole basis for treatment or other patient management decisions. Negative results must be combined with clinical observations, patient history, and  epidemiological information. The expected result is Negative.  Fact Sheet for Patients: SugarRoll.be  Fact Sheet for Healthcare Providers: https://www.woods-mathews.com/  This test is not yet approved or cleared by the [place] FDA and  has been authorized for detection and/or diagnosis of SARS-CoV-2 by FDA under an Emergency Use Authorization (EUA). This EUA will remain  in effect (meaning this test can be used) for the duration of the COVID-19 declaration under Se ction 564(b)(1) of the Act, 21 U.S.C. section 360bbb-3(b)(1), unless the authorization is terminated or revoked sooner.  Performed at [facility], [place] [address] [place], [place] [address]   CSF culture w Gram Stain     Status: None (Preliminary result)   Collection Time: 04/16/21  9:30 AM   Specimen: PATH Cytology CSF; Cerebrospinal Fluid  Result Value Ref Range Status   Specimen Description   Final    CSF Performed at [facility] [address], [place], [place] [address]    Special Requests   Final    NONE Performed at [facility], [place] [address] [place], [place] [address]    Gram Stain   Final    NO ORGANISMS SEEN Gram Stain Report Called to,Read Back By and Verified With: MELISSA RN AT 8469 ON 04/16/21 BY S.VANHOORNE Performed at [facility], [place] [address], [place], [place] [address]    Culture   Final    NO GROWTH 2 DAYS Performed at [facility] [address], [place], [place] [address]    Report Status PENDING  Incomplete    Pressure Injury 04/05/21 Sacrum Posterior;Medial;Lower Stage 2 -  Partial thickness loss of dermis presenting as a shallow open injury with a red, pink wound bed without slough. Appears to be stage two, bleeding right inside of buttocks and upper part of (Active)  04/05/21 2030  Location: Sacrum  Location Orientation: Posterior;Medial;Lower  Staging: Stage 2  -  Partial thickness loss of dermis presenting as a shallow open injury with a red, pink wound bed without slough.  Wound Description (Comments): Appears to be stage two, bleeding right inside of buttocks and upper part of bottom  Present on Admission: No          [place]   [facility] If 7PM-7AM, please contact night-coverage at [email or web address], Office  [phone]   04/18/2021, 6:03 PM  LOS: 22 days

## 2021-04-19 LAB — CSF CULTURE W GRAM STAIN
Culture: NO GROWTH
Gram Stain: NONE SEEN

## 2021-04-19 LAB — BASIC METABOLIC PANEL
Anion gap: 5 (ref 5–15)
BUN: 10 mg/dL (ref 8–23)
CO2: 29 mmol/L (ref 22–32)
Calcium: 9.1 mg/dL (ref 8.9–10.3)
Chloride: 99 mmol/L (ref 98–111)
Creatinine, Ser: 0.41 mg/dL — ABNORMAL LOW (ref 0.44–1.00)
GFR, Estimated: >60 mL/min (ref >60)
Glucose, Bld: 110 mg/dL — ABNORMAL HIGH (ref 70–99)
Potassium: 3.8 mmol/L (ref 3.5–5.1)
Sodium: 133 mmol/L — ABNORMAL LOW (ref 135–145)

## 2021-04-19 LAB — MAGNESIUM: Magnesium: 1.7 mg/dL (ref 1.7–2.4)

## 2021-04-19 LAB — HSV(HERPES SMPLX VRS)ABS-I+II(IGG)-CSF: HSV Type I/II Ab, IgG CSF: 2.54 IV — ABNORMAL HIGH (ref <=0.89)

## 2021-04-19 MED ORDER — MAGNESIUM SULFATE IN D5W 1-5 GM/100ML-% IV SOLN
1.0000 g | Freq: Once | INTRAVENOUS | Status: AC
  Administered 2021-04-19: 1 g via INTRAVENOUS
  Filled 2021-04-19: qty 100

## 2021-04-19 NOTE — Progress Notes (Signed)
Occupational Therapy Treatment Patient Details Name: Yvette Roberts MRN: [id number] DOB: 03/20/1956 Today's Date: 04/19/2021    History of present illness Yvette Roberts is a 65 year old female presents with severe odynophagia the resultant minimal intake and profound weight loss. EGD on 03/29/21 which showed mild nonsevere esophagitis. PMH: hypertension, COPD, diabetes, stage IV uterine cancer, history of malignant pleural effusion diagnosed in 2/22.   OT comments  Patient in bed upon arrival with lunch tray. Patient instructed in how to take on/off red foam handles on utensils as well as how to grip to maximize coordination for self feeding. Patient return demonstrate taking 3-4 bites of mashed potatoes before stating "it doesn't taste good." Per nursing patient minimally eating often stating food doesn't taste good, even when patient picks her meals. Instruct nursing to assist patient with doff/don of red foam handles before tray leaving room, verbalize understanding.    Follow Up Recommendations  SNF    Equipment Recommendations  Other (comment) (defer to next venue)       Precautions / Restrictions Precautions Precautions: Fall Precaution Comments: B LE  paresis (profound weakness)              ADL either performed or assessed with clinical judgement   ADL Overall ADL's : Needs assistance/impaired Eating/Feeding: Set up;Bed level Eating/Feeding Details (indicate cue type and reason): patient having increased difficulty manipulating utensils to feed herself. provided patient with red foam handles, instructed how to put on/off utensil, how to grip to maximize coordination for feeding. patient return demonstrate eating 3-4 bites of mashed potatoes then states "it doesn't taste good" per nursing patient says this often about all food brought to her room. notified RN patient's name put on foam handles and to assist patient with remembering to take them off before food tray brought out of  her room.                                          Cognition Arousal/Alertness: Awake/alert Behavior During Therapy: WFL for tasks assessed/performed Overall Cognitive Status: Impaired/Different from baseline                                 General Comments: patient asking OT to look outside her door to white cabinet door, unsure if patient thinks she is at home                   Pertinent Vitals/ Pain       Pain Assessment: No/denies pain         Frequency  Min 2X/week        Progress Toward Goals  OT Goals(current goals can now be found in the care plan section)  Progress towards OT goals: Progressing toward goals  Acute Rehab OT Goals Patient Stated Goal: return home OT Goal Formulation: With patient Time For Goal Achievement: 04/28/21 Potential to Achieve Goals: Fair ADL Goals Pt Will Transfer to Toilet: with transfer board;squat pivot transfer;with mod assist Pt/caregiver will Perform Home Exercise Program: Increased ROM;Increased strength;Both right and left upper extremity;With minimal assist  Plan Discharge plan remains appropriate       AM-PAC OT "6 Clicks" Daily Activity     Outcome Measure   Help from another person eating meals?: A Little Help from another person taking care of personal grooming?: A  Lot Help from another person toileting, which includes using toliet, bedpan, or urinal?: Total Help from another person bathing (including washing, rinsing, drying)?: Total Help from another person to put on and taking off regular upper body clothing?: A Lot Help from another person to put on and taking off regular lower body clothing?: Total 6 Click Score: 10    End of Session    OT Visit Diagnosis: Hemiplegia and hemiparesis;History of falling (Z91.81);Muscle weakness (generalized) (M62.81);Other symptoms and signs involving cognitive function Hemiplegia - caused by: Unspecified   Activity Tolerance Patient  tolerated treatment well   Patient Left in bed;with call bell/phone within reach;with bed alarm set;with family/visitor present   Nurse Communication Other (comment) (provided patient with red foam handles)        Time: 8937-3428 OT Time Calculation (min): 10 min  Charges: OT General Charges $OT Visit: 1 Visit OT Treatments $Self Care/Home Management : 8-22 mins  Delbert Phenix OT OT pager: [place] 04/19/2021, 1:18 PM

## 2021-04-19 NOTE — TOC Progression Note (Signed)
Transition of Care [facility]) - Progression Note    Patient Details  Name: Yvette Roberts MRN: [id number] Date of Birth: [birth year]  Transition of Care [facility] CM/SW Contact  Jakai Risse, Marjie Skiff, RN Phone Number: 04/19/2021, 11:26 AM  Clinical Narrative:     Damaris Schooner with Claiborne Billings at [facility] to confirm that they could still accept her once she is ready to dc. According to nursing staff she will have her last dose of IVIG on Friday night. Claiborne Billings states that [facility] take her on Saturday. She will need a Covid test prior to dc. TOC will continue.  Expected Discharge Plan: [place] Barriers to Discharge: Continued Medical Work up  Expected Discharge Plan and Services Expected Discharge Plan: [place]   Discharge Planning Services: CM Consult   Living arrangements for the past 2 months: Apartment Expected Discharge Date: 04/11/21                   Readmission Risk Interventions Readmission Risk Prevention Plan 04/05/2021  Transportation Screening Complete  Medication Review Press photographer) Complete  PCP or Specialist appointment within 3-5 days of discharge Complete  HRI or Home Care Consult Complete  SW Recovery Care/Counseling Consult Complete  [place] Complete  Some recent data might be hidden

## 2021-04-19 NOTE — Progress Notes (Signed)
[facility]  PROGRESS NOTE  VON INSCOE NIO:[id number] DOB: [birth year] DOA: 03/27/2021 PCP: [name], FNP   Brief HPI:   65 year old African-American female with past medical history of metastatic uterine cancer, diabetes mellitus type 2, hypertension, malignant pleural effusion who was admitted with odynophagia and weight loss.  She reported 50 pound weight loss in past 3 months.  Also underwent further work-up had a EGD and found to have nonsevere esophagitis in proximal and distal esophagus as well as scattered mild inflammation with erythema in the entire stomach.  She was started on Protonix and sucralfate.  Hospitalization was complicated by febrile neutropenia and Pseudomonas UTI.  Patient is s/p antibiotics treatment.    Subjective   Patient seen and examined, denies any new complaints.  Still has lower extremity weakness.   Assessment/Plan:    Bilateral lower extremity weakness -Unclear etiology -MRI lumbar spine showed no regional metastatic disease, showed small focus of edema and enhancement at the inferior endplate of G18 favored to be discogenic in nature.  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  Which showed 1 WBC, protein WNL, glucose WNL, RBC 0.  Gram stain showed no organisms.  Culture is pending. -Oligoclonal bands were negative -Follow HSV, VDRL, cytology, IgG CSF index -Follow-up paraneoplastic panel -If unrevealing she will need outpatient EMG/NCS   Odynophagia and esophagitis -Patient underwent EGD on 03/29/2021 which showed mild nonsevere esophagitis in the proximal and distal esophagus as well as scattered mild inflammation with erythema in the entire stomach -Biopsy from  stomach shows reactive gastropathy negative H. pylori. -No metaplasia dysplasia or pregnancy. -Biopsy from esophagus showed mild esophagitis, negative for fungus.  No dysplasia or metaplasia. -GI recommended continuing with Protonix twice daily and Carafate 1 g p.o. 3 times daily -Patient to follow-up with GI in the outpatient setting   Febrile neutropenia -Patient completed 7 days course of cefepime, cefepime was stopped on 04/07/2021 -No further fever noted   Pancytopenia -Stable  Hypokalemia -Potassium 3.4; replace potassium and follow BMP in am  Hypomagnesemia -Magnesium was 1.3, replace magnesium yesterday.  Today magnesium is 1.7. -will give additional magnesium sulfate 1 g IV x1.  Severe protein calorie malnutrition -Dietitian was consulted -Patient has started to eat, will need to monitor for refeeding syndrome -Monitor electrolytes  Metastatic uterine cancer -Followed by oncology, holding of further chemotherapy at this time -Oncology has signed off -Follow-up oncology as outpatient in about 1 month  Hypertension -Blood pressure is stable -Continue Coreg  Diabetes mellitus type 2 -CBG well controlled with -Hemoglobin A1c 6.1 -Follow-up PCP as outpatient  Malignant pleural effusion -CT scan showed loculated right-sided pleural effusion with 6.5 x 3.6 cm axial dimension as compared to 8.1x 3.9 cm. Basilar volume loss and signs of pleural thickening with decreased pleural thickening, markedly diminished since the prior PET and with continued decreased pleural thickening particularly in the inferior RIGHT chest even since the recent CT of the chest.  -Over the RIGHT hemidiaphragm pleural thickness approximately 7 mm as compared to 12 mm greatest thickness. LEFT chest is clear. Airways are patent." with the impression being "Decreased loculated pleural fluid and pleural thickening in the chest follows diminishing perihepatic soft tissue and capsular hepatic implants as  described" -No Respiratory Distress Follow outpatient with oncology   Scheduled  medications:    carvedilol  12.5 mg Oral BID WC   chlorhexidine  15 mL Mouth/Throat QID   Chlorhexidine Gluconate Cloth  6 each Topical Daily   feeding supplement  237 mL Oral TID BM   FLUoxetine  20 mg Oral Daily   gabapentin  200 mg Oral BID   Gerhardt's butt cream   Topical BID   magnesium oxide  800 mg Oral Daily   mouth rinse  15 mL Mouth Rinse q12n4p   melatonin  10 mg Oral QHS   mirtazapine  15 mg Oral QHS   multivitamin with minerals  1 tablet Oral Daily   nutrition supplement (JUVEN)  1 packet Oral BID WC   pantoprazole  40 mg Oral BID   polyethylene glycol  17 g Oral Daily   potassium chloride  40 mEq Oral Daily   protein supplement  1 Scoop Oral TID WC   sucralfate  1 g Oral TID WC & HS   thiamine  100 mg Oral Daily         Data Reviewed:   CBG:  Recent Labs  Lab 04/14/21 1204 04/14/21 1733 04/15/21 0754 04/15/21 1200 04/15/21 1654  GLUCAP 100* 107* 100* 101* 127*    SpO2: 97 % O2 Flow Rate (L/min): 2 L/min    Vitals:   04/19/21 0028 04/19/21 0145 04/19/21 0514 04/19/21 1433  BP: 133/76 136/88 136/83 126/89  Pulse: 93 91 94 100  Resp: 16 18 16 16   Temp: 98.3 F (36.8 C) 98 F (36.7 C) 97.8 F (36.6 C) 97.8 F (36.6 C)  TempSrc: Oral Oral Oral Oral  SpO2: 98% 98% 99% 97%  Weight:      Height:         Intake/Output Summary (Last 24 hours) at 04/19/2021 1656 Last data filed at 04/19/2021 1509 Gross per 24 hour  Intake --  Output 750 ml  Net -750 ml    06/21 1901 - 06/23 0700 In: -  Out: 2175 [Urine:2175]  Filed Weights   04/16/21 1720  Weight: 75.5 kg    CBC:  Recent Labs  Lab 04/14/21 0614 04/15/21 0500 04/16/21 0623 04/17/21 0639 04/18/21 0557  WBC 3.4* 3.2* 3.1* 2.2* 3.4*  HGB 9.4* 8.8* 8.9* 8.5* 8.8*  HCT 28.7* 27.0* 27.1* 26.2* 27.3*  PLT 211 204 195 189 194  MCV 88.3 89.1 89.1 89.4 89.5  MCH 28.9 29.0 29.3 29.0 28.9  MCHC 32.8  32.6 32.8 32.4 32.2  RDW 18.1* 18.2* 18.2* 18.2* 18.3*  LYMPHSABS 1.3 1.4 1.3 1.0 1.1  MONOABS 0.5 0.5 0.4 0.3 0.6  EOSABS 0.0 0.0 0.0 0.0 0.1  BASOSABS 0.0 0.0 0.0 0.0 0.0    Complete metabolic panel:  Recent Labs  Lab 04/14/21 0614 04/15/21 0500 04/16/21 0623 04/17/21 0639 04/18/21 0557 04/19/21 0450  NA 137 137 137 134* 134* 133*  K 3.3* 3.8 3.5 3.4* 3.4* 3.8  CL 99 103 102 100 99 99  CO2 29 28 29 27 29 29   GLUCOSE 102* 102* 116* 104* 106* 110*  BUN 8 8 9 8  6* 10  CREATININE 0.34* 0.40* 0.37* 0.37* 0.39* 0.41*  CALCIUM 9.3 9.1 9.2 9.0 9.2 9.1  AST 23 21 26 24 22   --   ALT 23 22 26 25 24   --   ALKPHOS 51 46 48 46 47  --   BILITOT 0.7 0.7 0.4 0.7 0.6  --   ALBUMIN 2.8* 2.6* 2.7* 2.5* 2.6*  --   MG 1.4* 1.4* 1.4*  1.3* 1.3* 1.7    No results for input(s): LIPASE, AMYLASE in the last 168 hours.  Recent Labs  Lab 04/14/21 1308  [employer]    ------------------------------------------------------------------------------------------------------------------ No results for input(s): CHOL, HDL, LDLCALC, TRIG, CHOLHDL, LDLDIRECT in the last 72 hours.  Lab Results  Component Value Date   HGBA1C 6.1 (H) 03/27/2021   ------------------------------------------------------------------------------------------------------------------ No results for input(s): TSH, T4TOTAL, T3FREE, THYROIDAB in the last 72 hours.  Invalid input(s): FREET3 ------------------------------------------------------------------------------------------------------------------ No results for input(s): VITAMINB12, FOLATE, FERRITIN, TIBC, IRON, RETICCTPCT in the last 72 hours.  Coagulation profile No results for input(s): INR, PROTIME in the last 168 hours. No results for input(s): DDIMER in the last 72 hours.  Cardiac Enzymes No results for input(s): CKTOTAL, CKMB, CKMBINDEX, TROPONINI in the last 168  hours.   ------------------------------------------------------------------------------------------------------------------    Component Value Date/Time   BNP 75.8 02/25/2021 1625     Antibiotics: Anti-infectives (From admission, onward)    Start     Dose/Rate Route Frequency Ordered Stop   04/01/21 0930  ceFEPIme (MAXIPIME) 2 g in sodium chloride 0.9 % 100 mL IVPB  Status:  Discontinued        2 g 200 mL/hr over 30 Minutes Intravenous Every 8 hours 04/01/21 0839 04/07/21 1345   03/27/21 1800  fluconazole (DIFLUCAN) IVPB 200 mg  Status:  Discontinued        200 mg 100 mL/hr over 60 Minutes Intravenous Daily 03/27/21 1734 03/29/21 1633        Radiology Reports  No results found.    DVT prophylaxis: Lovenox  Code Status: Full code  Family Communication: No family at bedside   Consultants: Neurology  Procedures: Lumbar puncture    Objective    Physical Examination:  General-appears in no acute distress Heart-S1-S2, regular, no murmur auscultated Lungs-clear to auscultation bilaterally, no wheezing or crackles auscultated Abdomen-soft, nontender, no organomegaly Extremities-no edema in the lower extremities, motor strength 4/5 in upper extremities, 1/5 in lower extremities Neuro-alert, oriented x3, no focal deficit noted   Status is: Inpatient  Dispo: The patient is from: Home              Anticipated d/c is to: Skilled nursing facility              Anticipated d/c date is: 04/20/2021              Patient currently not stable for discharge  Barrier to discharge-getting IVIG  COVID-19 Labs  No results for input(s): DDIMER, FERRITIN, LDH, CRP in the last 72 hours.  Lab Results  Component Value Date   [place] NEGATIVE 04/14/2021   [place] NEGATIVE 04/09/2021   [place] NEGATIVE 03/28/2021   [place] NEGATIVE 11/23/2020    Microbiology  Recent Results (from the past 240 hour(s))  SARS CORONAVIRUS 2 (TAT 6-24 HRS) Nasopharyngeal  Nasopharyngeal Swab     Status: None   Collection Time: 04/14/21  1:08 PM   Specimen: Nasopharyngeal Swab  Result Value Ref Range Status   SARS Coronavirus 2 NEGATIVE NEGATIVE Final    Comment: (NOTE) SARS-CoV-2 target nucleic acids are NOT DETECTED.  The SARS-CoV-2 RNA is generally detectable in upper and lower respiratory specimens during the acute phase of infection. Negative results do not preclude SARS-CoV-2 infection, do not rule out co-infections with other pathogens, and should not be used as the sole basis for treatment or other patient management decisions. Negative results must be combined with clinical observations, patient history, and epidemiological information. The expected result is Negative.  Fact Sheet for Patients: SugarRoll.be  Fact Sheet for Healthcare Providers: https://www.woods-mathews.com/  This test is not yet approved or cleared by the [place] FDA and  has been authorized for detection and/or diagnosis of SARS-CoV-2 by FDA under an Emergency Use Authorization (EUA). This EUA will remain  in effect (meaning this test can be used) for the duration of the COVID-19 declaration under Se ction 564(b)(1) of the Act, 21 U.S.C. section 360bbb-3(b)(1), unless the authorization is terminated or revoked sooner.  Performed at [facility], [place] [address] [place], [place] [address]   CSF culture w Gram Stain     Status: None   Collection Time: 04/16/21  9:30 AM   Specimen: PATH Cytology CSF; Cerebrospinal Fluid  Result Value Ref Range Status   Specimen Description   Final    CSF Performed at [facility] [address], [place], [place] [address]    Special Requests   Final    NONE Performed at [facility], [place] [address], [place], [place] [address]    Gram Stain   Final    NO ORGANISMS SEEN Gram Stain Report Called to,Read Back By and Verified With: MELISSA RN AT  1660 ON 04/16/21 BY S.VANHOORNE Performed at [facility], [place] [address], [place], [place] [address]    Culture   Final    NO GROWTH 3 DAYS Performed at [facility], [place] [address] [place], [place] [address]    Report Status 04/19/2021 FINAL  Final    Pressure Injury 04/05/21 Sacrum Posterior;Medial;Lower Stage 2 -  Partial thickness loss of dermis presenting as a shallow open injury with a red, pink wound bed without slough. Appears to be stage two, bleeding right inside of buttocks and upper part of (Active)  04/05/21 2030  Location: Sacrum  Location Orientation: Posterior;Medial;Lower  Staging: Stage 2 -  Partial thickness loss of dermis presenting as a shallow open injury with a red, pink wound bed without slough.  Wound Description (Comments): Appears to be stage two, bleeding right inside of buttocks and upper part of bottom  Present on Admission: No          [place]   [facility] If 7PM-7AM, please contact night-coverage at [email or web address], Office  [phone]   04/19/2021, 4:56 PM  LOS: 23 days

## 2021-04-20 LAB — GLUCOSE, CAPILLARY
Glucose-Capillary: 97 mg/dL (ref 70–99)
Glucose-Capillary: 110 mg/dL — ABNORMAL HIGH (ref 70–99)
Glucose-Capillary: 105 mg/dL — ABNORMAL HIGH (ref 70–99)

## 2021-04-20 LAB — MAGNESIUM: Magnesium: 1.6 mg/dL — ABNORMAL LOW (ref 1.7–2.4)

## 2021-04-20 LAB — FOLATE: Folate: 4.4 ng/mL — ABNORMAL LOW (ref >5.9)

## 2021-04-20 MED ORDER — MAGNESIUM SULFATE 2 GM/50ML IV SOLN
2.0000 g | Freq: Once | INTRAVENOUS | Status: AC
  Administered 2021-04-20: 2 g via INTRAVENOUS
  Filled 2021-04-20: qty 50

## 2021-04-20 NOTE — Progress Notes (Signed)
[facility]  PROGRESS NOTE  Yvette Roberts ZOX:[id number] DOB: 10-21-1956 DOA: 03/27/2021 PCP: [name], FNP   Brief HPI:   65 year old African-American female with past medical history of metastatic uterine cancer, diabetes mellitus type 2, hypertension, malignant pleural effusion who was admitted with odynophagia and weight loss.  She reported 50 pound weight loss in past 3 months.  Also underwent further work-up had a EGD and found to have nonsevere esophagitis in proximal and distal esophagus as well as scattered mild inflammation with erythema in the entire stomach.  She was started on Protonix and sucralfate.  Hospitalization was complicated by febrile neutropenia and Pseudomonas UTI.  Patient is s/p antibiotics treatment.    Subjective   Patient seen and examined, still continues to have weakness of lower extremities.  Today's last day of IVIG.  Will complete 5 days of treatment.   Assessment/Plan:    Bilateral lower extremity weakness -Unclear etiology -MRI lumbar spine showed no regional metastatic disease, showed small focus of edema and enhancement at the inferior endplate of W11 favored to be discogenic in nature.  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  Which showed 1 WBC, protein WNL, glucose WNL, RBC 0.  Gram stain showed no organisms.  Culture is pending. -Oligoclonal bands were zero -HSV type I/II antibody IgG CSF is elevated at 2.54; discussed with neurology and ID, will need to repeat LP to obtain CSF HSV type I/II PCR -Follow HSV, VDRL, cytology, IgG CSF index -Follow-up paraneoplastic panel -If unrevealing she will need outpatient EMG/NCS   Odynophagia and esophagitis -Patient  underwent EGD on 03/29/2021 which showed mild nonsevere esophagitis in the proximal and distal esophagus as well as scattered mild inflammation with erythema in the entire stomach -Biopsy from stomach shows reactive gastropathy negative H. pylori. -No metaplasia dysplasia or pregnancy. -Biopsy from esophagus showed mild esophagitis, negative for fungus.  No dysplasia or metaplasia. -GI recommended continuing with Protonix twice daily and Carafate 1 g p.o. 3 times daily -Patient to follow-up with GI in the outpatient setting   Febrile neutropenia -Patient completed 7 days course of cefepime, cefepime was stopped on 04/07/2021 -No further fever noted   Pancytopenia -Stable  Hypokalemia -Replete  Hypomagnesemia -Magnesium was 1.3, replace magnesium yesterday.   -Today magnesium is 1.6, will give 2 g of mag sulfate IV x1 -Follow serum magnesium level in a.m.  Severe protein calorie malnutrition -Dietitian was consulted -Patient has started to eat, will need to monitor for refeeding syndrome -Monitor electrolytes  Metastatic uterine cancer -Followed by oncology, holding of further chemotherapy at this time -Oncology has signed off -Follow-up oncology as outpatient in about 1 month  Hypertension -Blood pressure is stable -Continue Coreg  Diabetes mellitus type 2 -CBG well controlled with -Hemoglobin A1c 6.1 -Follow-up PCP as outpatient  Malignant pleural effusion -CT scan showed loculated right-sided pleural effusion with 6.5 x 3.6 cm axial dimension as compared to 8.1x 3.9 cm. Basilar volume loss and signs of pleural thickening with decreased pleural thickening, markedly diminished since the prior PET and with continued decreased pleural thickening particularly in the inferior RIGHT chest even since the recent CT of the chest.  -Over the RIGHT hemidiaphragm pleural thickness approximately 7 mm as compared to 12 mm greatest thickness. LEFT chest is clear. Airways are  patent."  with the impression being "Decreased loculated pleural fluid and pleural thickening in the chest follows diminishing perihepatic soft tissue and capsular hepatic implants as described" -No Respiratory Distress Follow outpatient with oncology   Scheduled medications:    carvedilol  12.5 mg Oral BID WC   chlorhexidine  15 mL Mouth/Throat QID   Chlorhexidine Gluconate Cloth  6 each Topical Daily   feeding supplement  237 mL Oral TID BM   FLUoxetine  20 mg Oral Daily   gabapentin  200 mg Oral BID   Gerhardt's butt cream   Topical BID   magnesium oxide  800 mg Oral Daily   mouth rinse  15 mL Mouth Rinse q12n4p   melatonin  10 mg Oral QHS   mirtazapine  15 mg Oral QHS   multivitamin with minerals  1 tablet Oral Daily   nutrition supplement (JUVEN)  1 packet Oral BID WC   pantoprazole  40 mg Oral BID   polyethylene glycol  17 g Oral Daily   potassium chloride  40 mEq Oral Daily   protein supplement  1 Scoop Oral TID WC   sucralfate  1 g Oral TID WC & HS   thiamine  100 mg Oral Daily         Data Reviewed:   CBG:  Recent Labs  Lab 04/15/21 0754 04/15/21 1200 04/15/21 1654 04/20/21 0740 04/20/21 1201  GLUCAP 100* 101* 127* 97 110*    SpO2: 97 % O2 Flow Rate (L/min): 2 L/min    Vitals:   04/20/21 0035 04/20/21 0052 04/20/21 0603 04/20/21 1322  BP: 133/88 (!) 146/87 (!) 141/84 (!) 134/92  Pulse: 91 91 96 95  Resp: 16 16 16 19   Temp: 98.1 F (36.7 C) (!) 97.5 F (36.4 C) 98.2 F (36.8 C) 98.8 F (37.1 C)  TempSrc: Oral Oral Oral Oral  SpO2: 96% 95% 97% 97%  Weight:      Height:         Intake/Output Summary (Last 24 hours) at 04/20/2021 1509 Last data filed at 04/20/2021 0900 Gross per 24 hour  Intake 118 ml  Output 550 ml  Net -432 ml    06/22 1901 - 06/24 0700 In: -  Out: 1300 [Urine:1300]  Filed Weights   04/16/21 1720  Weight: 75.5 kg    CBC:  Recent Labs  Lab 04/14/21 0614 04/15/21 0500 04/16/21 0623 04/17/21 0639 04/18/21 0557  WBC  3.4* 3.2* 3.1* 2.2* 3.4*  HGB 9.4* 8.8* 8.9* 8.5* 8.8*  HCT 28.7* 27.0* 27.1* 26.2* 27.3*  PLT 211 204 195 189 194  MCV 88.3 89.1 89.1 89.4 89.5  MCH 28.9 29.0 29.3 29.0 28.9  MCHC 32.8 32.6 32.8 32.4 32.2  RDW 18.1* 18.2* 18.2* 18.2* 18.3*  LYMPHSABS 1.3 1.4 1.3 1.0 1.1  MONOABS 0.5 0.5 0.4 0.3 0.6  EOSABS 0.0 0.0 0.0 0.0 0.1  BASOSABS 0.0 0.0 0.0 0.0 0.0    Complete metabolic panel:  Recent Labs  Lab 04/14/21 0614 04/15/21 0500 04/16/21 0623 04/17/21 0639 04/18/21 0557 04/19/21 0450 04/20/21 0511  NA 137 137 137 134* 134* 133*  --   K 3.3* 3.8 3.5 3.4* 3.4* 3.8  --   CL 99 103 102 100 99 99  --   CO2 29 28 29 27 29 29   --   GLUCOSE 102* 102* 116* 104* 106* 110*  --   BUN 8 8 9 8  6* 10  --   CREATININE 0.34* 0.40* 0.37* 0.37* 0.39* 0.41*  --  CALCIUM 9.3 9.1 9.2 9.0 9.2 9.1  --   AST 23 21 26 24 22   --   --   ALT 23 22 26 25 24   --   --   ALKPHOS 51 46 48 46 47  --   --   BILITOT 0.7 0.7 0.4 0.7 0.6  --   --   ALBUMIN 2.8* 2.6* 2.7* 2.5* 2.6*  --   --   MG 1.4* 1.4* 1.4* 1.3* 1.3* 1.7 1.6*    No results for input(s): LIPASE, AMYLASE in the last 168 hours.  Recent Labs  Lab 04/14/21 1308  [place]    ------------------------------------------------------------------------------------------------------------------ No results for input(s): CHOL, HDL, LDLCALC, TRIG, CHOLHDL, LDLDIRECT in the last 72 hours.  Lab Results  Component Value Date   HGBA1C 6.1 (H) 03/27/2021   ------------------------------------------------------------------------------------------------------------------ No results for input(s): TSH, T4TOTAL, T3FREE, THYROIDAB in the last 72 hours.  Invalid input(s): FREET3 ------------------------------------------------------------------------------------------------------------------ No results for input(s): VITAMINB12, FOLATE, FERRITIN, TIBC, IRON, RETICCTPCT in the last 72 hours.  Coagulation profile No results for input(s):  INR, PROTIME in the last 168 hours. No results for input(s): DDIMER in the last 72 hours.  Cardiac Enzymes No results for input(s): CKTOTAL, CKMB, CKMBINDEX, TROPONINI in the last 168 hours.   ------------------------------------------------------------------------------------------------------------------    Component Value Date/Time   BNP 75.8 02/25/2021 1625     Antibiotics: Anti-infectives (From admission, onward)    Start     Dose/Rate Route Frequency Ordered Stop   04/01/21 0930  ceFEPIme (MAXIPIME) 2 g in sodium chloride 0.9 % 100 mL IVPB  Status:  Discontinued        2 g 200 mL/hr over 30 Minutes Intravenous Every 8 hours 04/01/21 0839 04/07/21 1345   03/27/21 1800  fluconazole (DIFLUCAN) IVPB 200 mg  Status:  Discontinued        200 mg 100 mL/hr over 60 Minutes Intravenous Daily 03/27/21 1734 03/29/21 1633        Radiology Reports  No results found.    DVT prophylaxis: Lovenox  Code Status: Full code  Family Communication: No family at bedside   Consultants: Neurology  Procedures: Lumbar puncture    Objective    Physical Examination:  General-appears in no acute distress Heart-S1-S2, regular, no murmur auscultated Lungs-clear to auscultation bilaterally, no wheezing or crackles auscultated Abdomen-soft, nontender, no organomegaly Extremities-no edema in the lower extremities Neuro-alert, oriented x3, motor strength 5/5 in lower extremities, DTRs absent in lower extremities   Status is: Inpatient  Dispo: The patient is from: Home              Anticipated d/c is to: Skilled nursing facility              Anticipated d/c date is: 04/25/2019              Patient currently not stable for discharge  Barrier to discharge-getting IVIG, ongoing evaluation for lower extremity weakness  COVID-19 Labs  No results for input(s): DDIMER, FERRITIN, LDH, CRP in the last 72 hours.  Lab Results  Component Value Date   [place] NEGATIVE 04/14/2021    [place] NEGATIVE 04/09/2021   [place] NEGATIVE 03/28/2021   [place] NEGATIVE 11/23/2020    Microbiology  Recent Results (from the past 240 hour(s))  SARS CORONAVIRUS 2 (TAT 6-24 HRS) Nasopharyngeal Nasopharyngeal Swab     Status: None   Collection Time: 04/14/21  1:08 PM   Specimen: Nasopharyngeal Swab  Result Value Ref Range Status   SARS Coronavirus 2  NEGATIVE NEGATIVE Final    Comment: (NOTE) SARS-CoV-2 target nucleic acids are NOT DETECTED.  The SARS-CoV-2 RNA is generally detectable in upper and lower respiratory specimens during the acute phase of infection. Negative results do not preclude SARS-CoV-2 infection, do not rule out co-infections with other pathogens, and should not be used as the sole basis for treatment or other patient management decisions. Negative results must be combined with clinical observations, patient history, and epidemiological information. The expected result is Negative.  Fact Sheet for Patients: SugarRoll.be  Fact Sheet for Healthcare Providers: https://www.woods-mathews.com/  This test is not yet approved or cleared by the [place] FDA and  has been authorized for detection and/or diagnosis of SARS-CoV-2 by FDA under an Emergency Use Authorization (EUA). This EUA will remain  in effect (meaning this test can be used) for the duration of the COVID-19 declaration under Se ction 564(b)(1) of the Act, 21 U.S.C. section 360bbb-3(b)(1), unless the authorization is terminated or revoked sooner.  Performed at [facility], [place] [address] [place], [place] [address]   CSF culture w Gram Stain     Status: None   Collection Time: 04/16/21  9:30 AM   Specimen: PATH Cytology CSF; Cerebrospinal Fluid  Result Value Ref Range Status   Specimen Description   Final    CSF Performed at [facility] [address], [place], [place] [address]    Special Requests    Final    NONE Performed at [facility], [place] [address], [place], [place] [address]    Gram Stain   Final    NO ORGANISMS SEEN Gram Stain Report Called to,Read Back By and Verified With: MELISSA RN AT 3875 ON 04/16/21 BY S.VANHOORNE Performed at [facility], [place] [address] [place], [place] [address]    Culture   Final    NO GROWTH 3 DAYS Performed at [facility], [place] [address] [place], [place] [address]    Report Status 04/19/2021 FINAL  Final    Pressure Injury 04/05/21 Sacrum Posterior;Medial;Lower Stage 2 -  Partial thickness loss of dermis presenting as a shallow open injury with a red, pink wound bed without slough. Appears to be stage two, bleeding right inside of buttocks and upper part of (Active)  04/05/21 2030  Location: Sacrum  Location Orientation: Posterior;Medial;Lower  Staging: Stage 2 -  Partial thickness loss of dermis presenting as a shallow open injury with a red, pink wound bed without slough.  Wound Description (Comments): Appears to be stage two, bleeding right inside of buttocks and upper part of bottom  Present on Admission: No          [place]   [facility] If 7PM-7AM, please contact night-coverage at [email or web address], Office  [phone]   04/20/2021, 3:09 PM  LOS: 24 days

## 2021-04-20 NOTE — Progress Notes (Signed)
Pt Physical Therapy Treatment Patient Details Name: Yvette Roberts MRN: [id number] DOB: 1956/07/26 Today's Date: 04/20/2021    History of Present Illness Yvette Roberts is a 65 year old female presents with severe odynophagia the resultant minimal intake and profound weight loss. EGD on 03/29/21 which showed mild nonsevere esophagitis. PMH: hypertension, COPD, diabetes, stage IV uterine cancer, history of malignant pleural effusion diagnosed in 2/22.    PT Comments    Pt is NOT progressing with her mobility.  Profound weakness throughout.  Used STEDY to assist pt OOB was difficult.  General bed mobility comments: pt required increased assist Total + 2 using bed pad to complete scooting and present with poor sitting ability.  Required Max Assist to achieve semi upright posture.General transfer comment: Pt required Total Assist + 2 to achieve partial upright stance with STEDY.  Poor trunk control/inability to achieve self partial upright posture.  This is a decline from last PT session.   Follow Up Recommendations  SNF     Equipment Recommendations       Recommendations for Other Services       Precautions / Restrictions Precautions Precautions: Fall Precaution Comments: B LE  paresis (profound weakness) Restrictions Weight Bearing Restrictions: No    Mobility  Bed Mobility Overal bed mobility: Needs Assistance Bed Mobility: Supine to Sit     Supine to sit: Total assist;+2 for physical assistance;+2 for safety/equipment     General bed mobility comments: pt required increased assist Total + 2 using bed pad to complete scooting and present with poor sitting ability.  Required Max Assist to achieve semi upright posture.    Transfers Overall transfer level: Needs assistance   Transfers: Sit to/from Stand Sit to Stand: Max assist;Total assist;+2 physical assistance;+2 safety/equipment         General transfer comment: Pt required Total Assist + 2 to achieve partial upright  stance with STEDY.  Poor trunk control/inability to achieve self partial upright posture.  This is a decline from last PT session.  Ambulation/Gait             General Gait Details: not able   Stairs             Wheelchair Mobility    Modified Rankin (Stroke Patients Only)       Balance                                            Cognition Arousal/Alertness: Awake/alert Behavior During Therapy: Flat affect                                   General Comments: AxO x 2 but slow to respond.  I asked her what time was is and she was able to focus on the wall cloack and tell me the correct time.  Pt also able to express needs (water/deodorant)      Exercises      General Comments        Pertinent Vitals/Pain Pain Assessment: Faces Pain Location: General, no specific area Pain Descriptors / Indicators: Grimacing    Home Living                      Prior Function            PT Goals (current  goals can now be found in the care plan section) Progress towards PT goals: Not progressing toward goals - comment    Frequency    Min 2X/week      PT Plan Current plan remains appropriate    Co-evaluation              AM-PAC PT "6 Clicks" Mobility   Outcome Measure  Help needed turning from your back to your side while in a flat bed without using bedrails?: Total Help needed moving from lying on your back to sitting on the side of a flat bed without using bedrails?: Total Help needed moving to and from a bed to a chair (including a wheelchair)?: Total Help needed standing up from a chair using your arms (e.g., wheelchair or bedside chair)?: Total Help needed to walk in hospital room?: Total Help needed climbing 3-5 steps with a railing? : Total 6 Click Score: 6    End of Session Equipment Utilized During Treatment: Gait belt Activity Tolerance: Other (comment) (profound weakness) Patient left: in  chair;with call bell/phone within reach;with chair alarm set Nurse Communication: Mobility status;Need for lift equipment PT Visit Diagnosis: Other abnormalities of gait and mobility (R26.89);Muscle weakness (generalized) (M62.81)     Time: 1500-1530 PT Time Calculation (min) (ACUTE ONLY): 30 min  Charges:  $Therapeutic Activity: 23-37 mins                     {Siyah Mault  PTA Acute  Rehabilitation Services Pager      [phone] Office      [phone]

## 2021-04-20 NOTE — Progress Notes (Signed)
Neurology Progress Note  S: Patient says she feels fine. States she has not been OOB today. Just shrugs her shoulders when asked if she feels stronger. IN review of PT/OT notes, SNF is recommended on discharge.   O: Current vital signs: BP (!) 134/92 (BP Location: Left Arm)   Pulse 95   Temp 98.8 F (37.1 C) (Oral)   Resp 19   Ht 5\' 6"  (1.676 m)   Wt 75.5 kg   SpO2 97%   BMI 26.87 kg/m  Vital signs in last 24 hours: Temp:  [97.5 F (36.4 C)-98.8 F (37.1 C)] 98.8 F (37.1 C) (06/24 1322) Pulse Rate:  [91-100] 95 (06/24 1322) Resp:  [16-19] 19 (06/24 1322) BP: (108-146)/(73-92) 134/92 (06/24 1322) SpO2:  [95 %-99 %] 97 % (06/24 1322)  GENERAL: Awake, alert in NAD. HEENT: Normocephalic and atraumatic LUNGS: Normal respiratory effort.  Ext: warm. Psych: strange affect.   NEURO:  Mental Status: Alert, oriented to self, year. Disoriented to place, day, date.  Speech/Language: speech is without aphasia or dysarthria.  Naming, repetition, fluency, and comprehension intact. Distracted at times.   Cranial Nerves: PERRL, Eyelids elevate symmetrically. Smile is symmetrical. Hearing intact to voice. Poor effort on strength exam. 5/5 trapezius. 3+/5 grips with hand deformities. 4/5 Bilateral biceps/triceps. Dorsiflexion/plantar flexion is 1/5 bilaterally. Hyper paraesthesias to hands/feet.   DTRs: 2+ UEs. [place] 0.   Medications  Current Facility-Administered Medications:    acetaminophen (TYLENOL) tablet 650 mg, 650 mg, Oral, Q6H PRN, 650 mg at 04/19/21 2118 **OR** acetaminophen (TYLENOL) suppository 650 mg, 650 mg, Rectal, Q4H PRN, Lang Snow, FNP   carvedilol (COREG) tablet 12.5 mg, 12.5 mg, Oral, BID WC, Domenic Polite, MD, 12.5 mg at 04/20/21 0840   chlorhexidine (PERIDEX) 0.12 % solution 15 mL, 15 mL, Mouth/Throat, QID, Georgette Shell, MD, 15 mL at 04/19/21 2122   Chlorhexidine Gluconate Cloth 2 % PADS 6 each, 6 each, Topical, Daily, Domenic Polite, MD, 6 each at  04/19/21 1030   feeding supplement (ENSURE ENLIVE / ENSURE PLUS) liquid 237 mL, 237 mL, Oral, TID BM, Sheikh, Omair Latif, DO, 237 mL at 04/19/21 2023   FLUoxetine (PROZAC) capsule 20 mg, 20 mg, Oral, Daily, Bell, Michelle T, RPH, 20 mg at 04/20/21 7619   gabapentin (NEURONTIN) capsule 200 mg, 200 mg, Oral, BID, Domenic Polite, MD, 200 mg at 04/20/21 0840   Gerhardt's butt cream, , Topical, BID, Georgette Shell, MD, Given at 04/19/21 2119   Immune Globulin 10% (PRIVIGEN) IV infusion 30 g, 30 g, Intravenous, Q24 Hr x 5, Walsie Smeltz, MD, Last Rate: 181.2 mL/hr at 04/20/21 0035, Rate Change at 04/20/21 0035   lip balm (CARMEX) ointment 1 application, 1 application, Topical, PRN, Bonnell Public Tublu, MD   magnesium oxide (MAG-OX) tablet 800 mg, 800 mg, Oral, Daily, Georgette Shell, MD, 800 mg at 04/20/21 0840   MEDLINE mouth rinse, 15 mL, Mouth Rinse, q12n4p, Domenic Polite, MD, 15 mL at 04/17/21 1202   melatonin tablet 10 mg, 10 mg, Oral, QHS, Domenic Polite, MD, 10 mg at 04/19/21 2118   mirtazapine (REMERON SOL-TAB) disintegrating tablet 15 mg, 15 mg, Oral, QHS, Domenic Polite, MD, 15 mg at 04/19/21 2119   multivitamin with minerals tablet 1 tablet, 1 tablet, Oral, Daily, Raiford Noble Latif, DO, 1 tablet at 04/20/21 5093   naloxone [facility]) injection 0.4 mg, 0.4 mg, Intravenous, PRN, Kayleen Memos, DO   nutrition supplement (JUVEN) (JUVEN) powder packet 1 packet, 1 packet, Oral, BID WC, Landis Gandy  G, MD, 1 packet at 04/19/21 1204   ondansetron (ZOFRAN) tablet 4 mg, 4 mg, Oral, Q6H PRN, 4 mg at 04/05/21 1817 **OR** ondansetron (ZOFRAN) injection 4 mg, 4 mg, Intravenous, Q6H PRN, Domenic Polite, MD, 4 mg at 04/05/21 0908   oxyCODONE (Oxy IR/ROXICODONE) immediate release tablet 10 mg, 10 mg, Oral, Q6H PRN, Georgette Shell, MD, 10 mg at 04/20/21 0839   pantoprazole (PROTONIX) EC tablet 40 mg, 40 mg, Oral, BID, Eudelia Bunch, RPH, 40 mg at 04/20/21 0839    phenol (CHLORASEPTIC) mouth spray 1 spray, 1 spray, Mouth/Throat, PRN, Domenic Polite, MD, 1 spray at 03/30/21 0800   polyethylene glycol (MIRALAX / GLYCOLAX) packet 17 g, 17 g, Oral, Daily, Domenic Polite, MD, 17 g at 04/17/21 1004   potassium chloride SA (KLOR-CON) CR tablet 40 mEq, 40 mEq, Oral, Daily, Georgette Shell, MD, 40 mEq at 04/20/21 0840   protein supplement (RESOURCE BENEPROTEIN) powder packet 6 g, 1 Scoop, Oral, TID WC, Sheikh, Omair Latif, DO, 6 g at 04/19/21 1204   sucralfate (CARAFATE) tablet 1 g, 1 g, Oral, TID WC & HS, Brahmbhatt, Parag, MD, 1 g at 04/20/21 0840   thiamine tablet 100 mg, 100 mg, Oral, Daily, Eudelia Bunch, RPH, 100 mg at 04/20/21 1093  Assessment: 65 yo female who was admitted 23 days ago for progressive BLE weakness. Patient was s/p chemo for uterine cancer. CIDP and paraneoplastic syndrome were on the differential, and after discussing IVIG vs PLEX with patient and son, IVIG was elected. After evaluation/consultation, spinal imaging did not reveal any cord compression to count for her symptoms. LP was unremarkable. She was placed on Thiamine.Today,is IVIG day 5/5 and she has not progressed as hoped. As this could still be CIDP without help with IVIG, there is no further workup or treatment to aide in her recovery from a neurological standpoint. Her situation could likely be permanent with inability to get back to her baseline, although extensive therapy may help.   Plan: -Continue thiamine.  -Continue MVI.  -Continue to optimize nutrition.  -Agree with PT recommendation for SNF.  -No further recommendations per neurology.  -Recommend out patient appointment with Dr. Deliah Boston at [facility].   Pt seen by Clance Boll, MSN, APN-BC/Nurse Practitioner/Neuro and later by MD. Note and plan to be edited as needed by MD.  Pager: [phone]   NEUROHOSPITALIST ADDENDUM Performed a face to face diagnostic evaluation.   I have reviewed the  contents of history and physical exam as documented by PA/ARNP/Resident and agree with above documentation.  I have discussed and formulated the above plan as documented. Edits to the note have been made as needed.  Impression/Key exam findings/Plan: 89 F admitted with few months history of progressive BL lower ext weakness with hyperasthesia + paresthesias with decreased reflexes along with BL Hand weakness. Recent diagnosis of Uterine cancer with good response to chemo and shrinking of her tumors.  LP with no pleocytosis, no elevated protein, not concerning for infection. OCB negative, IgG index was not collected. She is on multivitamin replacement. Paraneoplastic panel on CSF and serum is pending. She was started on IVIG x 5 doses with no improvement in her symptoms.  Of note, her HSV IgG on CSF returned elevated. I am not sure why HSV PCR was not ordered. MRI Brain and CSF profile is not suggestive of HSV encephalitis and she has no clear enhancement of the nerve roots on MRI C/T/L spine suggestive of HSV Polyradiculomyelitis. Hospitalist team discussed  with ID and they asked for a repeat LP with HSV PCR which is pending.  I suspect her presentation is likely due to nutritional deficiency vs potential CIDP.  Recommend follow up outpatient with Dr. Narda Amber with [facility].  Donnetta Simpers, MD [facility] [phone]   If 7pm to 7am, please call on call as listed on AMION.

## 2021-04-21 ENCOUNTER — Inpatient Hospital Stay (HOSPITAL_COMMUNITY): Payer: Medicare Other

## 2021-04-21 LAB — GLUCOSE, CAPILLARY
Glucose-Capillary: 104 mg/dL — ABNORMAL HIGH (ref 70–99)
Glucose-Capillary: 117 mg/dL — ABNORMAL HIGH (ref 70–99)
Glucose-Capillary: 127 mg/dL — ABNORMAL HIGH (ref 70–99)

## 2021-04-21 LAB — MAGNESIUM: Magnesium: 1.5 mg/dL — ABNORMAL LOW (ref 1.7–2.4)

## 2021-04-21 MED ORDER — FOLIC ACID 1 MG PO TABS
1.0000 mg | ORAL_TABLET | Freq: Every day | ORAL | Status: DC
  Administered 2021-04-21 – 2021-04-28 (×8): 1 mg via ORAL
  Filled 2021-04-21 (×8): qty 1

## 2021-04-21 MED ORDER — MAGNESIUM SULFATE 4 GM/100ML IV SOLN
4.0000 g | Freq: Once | INTRAVENOUS | Status: AC
  Administered 2021-04-21: 4 g via INTRAVENOUS
  Filled 2021-04-21: qty 100

## 2021-04-21 MED ORDER — SODIUM CHLORIDE 0.9 % IV SOLN
INTRAVENOUS | Status: DC | PRN
  Administered 2021-04-21: 250 mL via INTRAVENOUS

## 2021-04-21 MED ORDER — SODIUM CHLORIDE 0.9% FLUSH: 10.0000 mL | INTRAVENOUS | Status: DC | PRN

## 2021-04-21 NOTE — Procedures (Signed)
Technically successful fluoroscopically guided lumbar puncture performed at L4-5 without immediate complication. 6.5 mL of CSF sent for the requested laboratory studies.

## 2021-04-21 NOTE — Progress Notes (Signed)
[facility]  PROGRESS NOTE  Yvette Roberts BOF:[id number] DOB: 07/28/1956 DOA: 03/27/2021 PCP: [name], FNP   Brief HPI:   65 year old African-American female with past medical history of metastatic uterine cancer, diabetes mellitus type 2, hypertension, malignant pleural effusion who was admitted with odynophagia and weight loss.  She reported 50 pound weight loss in past 3 months.  Also underwent further work-up had a EGD and found to have nonsevere esophagitis in proximal and distal esophagus as well as scattered mild inflammation with erythema in the entire stomach.  She was started on Protonix and sucralfate.  Hospitalization was complicated by febrile neutropenia and Pseudomonas UTI.  Patient is s/p antibiotics treatment.    Subjective   Patient seen and examined, she is confused today.  Neurology had recommended repeat LP to check for HSV type I/II PCR.  Lumbar puncture to be done today.   Assessment/Plan:    Bilateral lower extremity weakness -Unclear etiology -MRI lumbar spine showed no regional metastatic disease, showed small focus of edema and enhancement at the inferior endplate of D78 favored to be discogenic in nature.  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  Completed IVIG, no improvement in weakness. -S/p LP.  Which showed 1 WBC, protein WNL, glucose WNL, RBC 0.  Gram stain showed no organisms.  Culture is pending. -Oligoclonal bands were zero -HSV type I/II antibody IgG CSF is elevated at 2.54; discussed with neurology and ID, will need to repeat LP to obtain CSF HSV type I/II PCR.  Plan for LP today.  IR has been consulted. -Follow HSV, VDRL, cytology, IgG CSF index -Follow-up  paraneoplastic panel -If unrevealing she will need outpatient EMG/NCS   Odynophagia and esophagitis -Patient underwent EGD on 03/29/2021 which showed mild nonsevere esophagitis in the proximal and distal esophagus as well as scattered mild inflammation with erythema in the entire stomach -Biopsy from stomach shows reactive gastropathy negative H. pylori. -No metaplasia dysplasia or pregnancy. -Biopsy from esophagus showed mild esophagitis, negative for fungus.  No dysplasia or metaplasia. -GI recommended continuing with Protonix twice daily and Carafate 1 g p.o. 3 times daily -Patient to follow-up with GI in the outpatient setting   Febrile neutropenia -Patient completed 7 days course of cefepime, cefepime was stopped on 04/07/2021 -No further fever noted   Pancytopenia -Stable  Hypokalemia -Replete  Hypomagnesemia -Magnesium was 1.3, replace magnesium yesterday.   -Today magnesium is 1.5 will give 4 g of mag sulfate IV x1 -Follow serum magnesium level in a.m.  Severe protein calorie malnutrition -Dietitian was consulted -Patient has started to eat, will need to monitor for refeeding syndrome -Monitor electrolytes  Metastatic uterine cancer -Followed by oncology, holding of further chemotherapy at this time -Oncology has signed off -Follow-up oncology as outpatient in about 1 month  Hypertension -Blood pressure is stable -Continue Coreg  Diabetes mellitus type 2 -CBG well controlled with -Hemoglobin A1c 6.1 -Follow-up PCP as outpatient  Malignant pleural effusion -CT scan showed loculated right-sided pleural effusion with 6.5 x 3.6 cm axial dimension as compared to 8.1x 3.9 cm. Basilar volume loss and signs of pleural thickening with decreased pleural thickening, markedly diminished since the prior PET and with continued decreased pleural thickening particularly in the inferior RIGHT chest even since the recent CT of the chest.  -Over the  RIGHT hemidiaphragm pleural  thickness approximately 7 mm as compared to 12 mm greatest thickness. LEFT chest is clear. Airways are patent." with the impression being "Decreased loculated pleural fluid and pleural thickening in the chest follows diminishing perihepatic soft tissue and capsular hepatic implants as described" -No Respiratory Distress Follow outpatient with oncology   Scheduled medications:    carvedilol  12.5 mg Oral BID WC   chlorhexidine  15 mL Mouth/Throat QID   Chlorhexidine Gluconate Cloth  6 each Topical Daily   feeding supplement  237 mL Oral TID BM   FLUoxetine  20 mg Oral Daily   folic acid  1 mg Oral Daily   gabapentin  200 mg Oral BID   Gerhardt's butt cream   Topical BID   magnesium oxide  800 mg Oral Daily   mouth rinse  15 mL Mouth Rinse q12n4p   melatonin  10 mg Oral QHS   mirtazapine  15 mg Oral QHS   multivitamin with minerals  1 tablet Oral Daily   nutrition supplement (JUVEN)  1 packet Oral BID WC   pantoprazole  40 mg Oral BID   polyethylene glycol  17 g Oral Daily   potassium chloride  40 mEq Oral Daily   protein supplement  1 Scoop Oral TID WC   sucralfate  1 g Oral TID WC & HS   thiamine  100 mg Oral Daily         Data Reviewed:   CBG:  Recent Labs  Lab 04/20/21 0740 04/20/21 1201 04/20/21 1758 04/21/21 0728 04/21/21 1140  GLUCAP 97 110* 105* 104* 117*    SpO2: 100 % O2 Flow Rate (L/min): 2 L/min    Vitals:   04/21/21 0252 04/21/21 0352 04/21/21 0430 04/21/21 1430  BP: (!) 146/94 129/86 (!) 153/96 (!) 136/93  Pulse: 97 98 98 92  Resp: 18 17 18 18   Temp: 98.7 F (37.1 C) 98 F (36.7 C) 98.9 F (37.2 C) 97.7 F (36.5 C)  TempSrc: Oral Oral Oral Oral  SpO2: 98% 96% 98% 100%  Weight:      Height:         Intake/Output Summary (Last 24 hours) at 04/21/2021 1520 Last data filed at 04/21/2021 1300 Gross per 24 hour  Intake 168 ml  Output 1200 ml  Net -1032 ml    06/23 1901 - 06/25 0700 In: 408 [P.O.:358] Out: 1750 [Urine:1750]  Filed  Weights   04/16/21 1720  Weight: 75.5 kg    CBC:  Recent Labs  Lab 04/15/21 0500 04/16/21 0623 04/17/21 0639 04/18/21 0557  WBC 3.2* 3.1* 2.2* 3.4*  HGB 8.8* 8.9* 8.5* 8.8*  HCT 27.0* 27.1* 26.2* 27.3*  PLT 204 195 189 194  MCV 89.1 89.1 89.4 89.5  MCH 29.0 29.3 29.0 28.9  MCHC 32.6 32.8 32.4 32.2  RDW 18.2* 18.2* 18.2* 18.3*  LYMPHSABS 1.4 1.3 1.0 1.1  MONOABS 0.5 0.4 0.3 0.6  EOSABS 0.0 0.0 0.0 0.1  BASOSABS 0.0 0.0 0.0 0.0    Complete metabolic panel:  Recent Labs  Lab 04/15/21 0500 04/16/21 0623 04/17/21 0639 04/18/21 0557 04/19/21 0450 04/20/21 0511 04/21/21 0500  NA 137 137 134* 134* 133*  --   --   K 3.8 3.5 3.4* 3.4* 3.8  --   --   CL 103 102 100 99 99  --   --   CO2 28 29 27 29 29   --   --   GLUCOSE 102* 116* 104* 106* 110*  --   --  BUN 8 9 8  6* 10  --   --   CREATININE 0.40* 0.37* 0.37* 0.39* 0.41*  --   --   CALCIUM 9.1 9.2 9.0 9.2 9.1  --   --   AST 21 26 24 22   --   --   --   ALT 22 26 25 24   --   --   --   ALKPHOS 46 48 46 47  --   --   --   BILITOT 0.7 0.4 0.7 0.6  --   --   --   ALBUMIN 2.6* 2.7* 2.5* 2.6*  --   --   --   MG 1.4* 1.4* 1.3* 1.3* 1.7 1.6* 1.5*    No results for input(s): LIPASE, AMYLASE in the last 168 hours.  No results for input(s): CRP, DDIMER, BNP, PROCALCITON, SARSCOV2NAA in the last 168 hours.  Invalid input(s): LACTICACID   ------------------------------------------------------------------------------------------------------------------ No results for input(s): CHOL, HDL, LDLCALC, TRIG, CHOLHDL, LDLDIRECT in the last 72 hours.  Lab Results  Component Value Date   HGBA1C 6.1 (H) 03/27/2021   ------------------------------------------------------------------------------------------------------------------ No results for input(s): TSH, T4TOTAL, T3FREE, THYROIDAB in the last 72 hours.  Invalid input(s):  FREET3 ------------------------------------------------------------------------------------------------------------------ Recent Labs    04/20/21 1744  FOLATE 4.4*    Coagulation profile No results for input(s): INR, PROTIME in the last 168 hours. No results for input(s): DDIMER in the last 72 hours.  Cardiac Enzymes No results for input(s): CKTOTAL, CKMB, CKMBINDEX, TROPONINI in the last 168 hours.   ------------------------------------------------------------------------------------------------------------------    Component Value Date/Time   BNP 75.8 02/25/2021 1625     Antibiotics: Anti-infectives (From admission, onward)    Start     Dose/Rate Route Frequency Ordered Stop   04/01/21 0930  ceFEPIme (MAXIPIME) 2 g in sodium chloride 0.9 % 100 mL IVPB  Status:  Discontinued        2 g 200 mL/hr over 30 Minutes Intravenous Every 8 hours 04/01/21 0839 04/07/21 1345   03/27/21 1800  fluconazole (DIFLUCAN) IVPB 200 mg  Status:  Discontinued        200 mg 100 mL/hr over 60 Minutes Intravenous Daily 03/27/21 1734 03/29/21 1633        Radiology Reports  DG FL GUIDED LUMBAR PUNCTURE  Result Date: 04/21/2021 CLINICAL DATA:  Weakness. EXAM: DIAGNOSTIC LUMBAR PUNCTURE UNDER FLUOROSCOPIC GUIDANCE COMPARISON:  Fluoroscopically guided lumbar puncture 04/16/2021. Lumbar spine MRI 04/11/2021. FLUOROSCOPY TIME:  Fluoroscopy Time:  12 seconds Radiation Exposure Index (if provided by the fluoroscopic device): 0.7 mGy Number of Acquired Spot Images: 0 PROCEDURE: Informed consent was obtained from the patient's son prior to the procedure, including potential complications of headache, allergy, and pain. With the patient prone, the lower back was prepped with Betadine. 1% Lidocaine was used for local anesthesia. Lumbar puncture was performed at the L4-5 level using a 3.5 inch 20 gauge needle via a right interlaminar approach with return of clear CSF. 6.5 mL of CSF were obtained for laboratory  studies. The patient tolerated the procedure well and there were no apparent complications. IMPRESSION: Technically successful fluoroscopically guided lumbar puncture. Electronically Signed   By: Logan Bores M.D.   On: 04/21/2021 14:41      DVT prophylaxis: Lovenox  Code Status: Full code  Family Communication: No family at bedside   Consultants: Neurology  Procedures: Lumbar puncture    Objective    Physical Examination:  General-appears in no acute distress Heart-S1-S2, regular, no murmur auscultated Lungs-clear to auscultation bilaterally, no  wheezing or crackles auscultated Abdomen-soft, nontender, no organomegaly Extremities-no edema in the lower extremities Neuro-alert, oriented to self only, confused.  Motor strength 1/5 in lower extremities   Status is: Inpatient  Dispo: The patient is from: Home              Anticipated d/c is to: Skilled nursing facility              Anticipated d/c date is: 04/24/2021              Patient currently not stable for discharge  Barrier to discharge-getting IVIG, ongoing evaluation for lower extremity weakness  COVID-19 Labs  No results for input(s): DDIMER, FERRITIN, LDH, CRP in the last 72 hours.  Lab Results  Component Value Date   [place] NEGATIVE 04/14/2021   [place] NEGATIVE 04/09/2021   [place] NEGATIVE 03/28/2021   [place] NEGATIVE 11/23/2020    Microbiology  Recent Results (from the past 240 hour(s))  SARS CORONAVIRUS 2 (TAT 6-24 HRS) Nasopharyngeal Nasopharyngeal Swab     Status: None   Collection Time: 04/14/21  1:08 PM   Specimen: Nasopharyngeal Swab  Result Value Ref Range Status   SARS Coronavirus 2 NEGATIVE NEGATIVE Final    Comment: (NOTE) SARS-CoV-2 target nucleic acids are NOT DETECTED.  The SARS-CoV-2 RNA is generally detectable in upper and lower respiratory specimens during the acute phase of infection. Negative results do not preclude SARS-CoV-2 infection, do not rule  out co-infections with other pathogens, and should not be used as the sole basis for treatment or other patient management decisions. Negative results must be combined with clinical observations, patient history, and epidemiological information. The expected result is Negative.  Fact Sheet for Patients: SugarRoll.be  Fact Sheet for Healthcare Providers: https://www.woods-mathews.com/  This test is not yet approved or cleared by the [place] FDA and  has been authorized for detection and/or diagnosis of SARS-CoV-2 by FDA under an Emergency Use Authorization (EUA). This EUA will remain  in effect (meaning this test can be used) for the duration of the COVID-19 declaration under Se ction 564(b)(1) of the Act, 21 U.S.C. section 360bbb-3(b)(1), unless the authorization is terminated or revoked sooner.  Performed at [facility], [place] [address] [place], [place] [address]   CSF culture w Gram Stain     Status: None   Collection Time: 04/16/21  9:30 AM   Specimen: PATH Cytology CSF; Cerebrospinal Fluid  Result Value Ref Range Status   Specimen Description   Final    CSF Performed at [facility] [address], [place], [place] [address]    Special Requests   Final    NONE Performed at [facility], [place] [address], [place], [place] [address]    Gram Stain   Final    NO ORGANISMS SEEN Gram Stain Report Called to,Read Back By and Verified With: MELISSA RN AT 2706 ON 04/16/21 BY S.VANHOORNE Performed at [facility], [place] [address], [place], [place] [address]    Culture   Final    NO GROWTH 3 DAYS Performed at [facility], [place] [address], [place],  [address]    Report Status 04/19/2021 FINAL  Final    Pressure Injury 04/05/21 Sacrum Posterior;Medial;Lower Stage 2 -  Partial thickness loss of dermis presenting as a shallow open injury with a red, pink wound  bed without slough. Appears to be stage two, bleeding right inside of buttocks and upper part of (Active)  04/05/21  2030  Location: Sacrum  Location Orientation: Posterior;Medial;Lower  Staging: Stage 2 -  Partial thickness loss of dermis presenting as a shallow open injury with a red, pink wound bed without slough.  Wound Description (Comments): Appears to be stage two, bleeding right inside of buttocks and upper part of bottom  Present on Admission: No          [place]   [facility] If 7PM-7AM, please contact night-coverage at [email or web address], Office  [phone]   04/21/2021, 3:20 PM  LOS: 25 days

## 2021-04-21 NOTE — Plan of Care (Signed)
64/F with a prolonged and complicated hospital stay. Orientation + delirium is waxing/waning. No acute distress this AM.   Problem: Education: Goal: Knowledge of General Education information will improve Description: Including pain rating scale, medication(s)/side effects and non-pharmacologic comfort measures Outcome: Not Progressing   Problem: Health Behavior/Discharge Planning: Goal: Ability to manage health-related needs will improve Outcome: Not Progressing   Problem: Clinical Measurements: Goal: Ability to maintain clinical measurements within normal limits will improve Outcome: Not Progressing Goal: Will remain free from infection Outcome: Not Progressing Goal: Diagnostic test results will improve Outcome: Not Progressing Goal: Respiratory complications will improve Outcome: Not Progressing Goal: Cardiovascular complication will be avoided Outcome: Not Progressing   Problem: Activity: Goal: Risk for activity intolerance will decrease Outcome: Not Progressing   Problem: Nutrition: Goal: Adequate nutrition will be maintained Outcome: Not Progressing   Problem: Coping: Goal: Level of anxiety will decrease Outcome: Not Progressing   Problem: Elimination: Goal: Will not experience complications related to bowel motility Outcome: Not Progressing Goal: Will not experience complications related to urinary retention Outcome: Not Progressing   Problem: Pain Managment: Goal: General experience of comfort will improve Outcome: Not Progressing   Problem: Safety: Goal: Ability to remain free from injury will improve Outcome: Not Progressing   Problem: Skin Integrity: Goal: Risk for impaired skin integrity will decrease Outcome: Not Progressing   Problem: Nutrition Goal: Patient maintains adequate hydration Outcome: Not Progressing Goal: Patient maintains weight Outcome: Not Progressing Goal: Patient/Family demonstrates understanding of diet Outcome: Not  Progressing Goal: Patient/Family independently completes tube feeding Outcome: Not Progressing Goal: Patient will have no more than 5 lb weight change during LOS Outcome: Not Progressing Goal: Patient will utilize adaptive techniques to administer nutrition Outcome: Not Progressing Goal: Patient will verbalize dietary restrictions Outcome: Not Progressing

## 2021-04-22 LAB — COMPREHENSIVE METABOLIC PANEL
ALT: 18 U/L (ref 0–44)
AST: 18 U/L (ref 15–41)
Albumin: 2.5 g/dL — ABNORMAL LOW (ref 3.5–5.0)
Alkaline Phosphatase: 48 U/L (ref 38–126)
Anion gap: 7 (ref 5–15)
BUN: 14 mg/dL (ref 8–23)
CO2: 30 mmol/L (ref 22–32)
Calcium: 9.4 mg/dL (ref 8.9–10.3)
Chloride: 98 mmol/L (ref 98–111)
Creatinine, Ser: 0.42 mg/dL — ABNORMAL LOW (ref 0.44–1.00)
GFR, Estimated: >60 mL/min (ref >60)
Glucose, Bld: 114 mg/dL — ABNORMAL HIGH (ref 70–99)
Potassium: 3.6 mmol/L (ref 3.5–5.1)
Sodium: 135 mmol/L (ref 135–145)
Total Bilirubin: 0.5 mg/dL (ref 0.3–1.2)
Total Protein: 8.8 g/dL — ABNORMAL HIGH (ref 6.5–8.1)

## 2021-04-22 LAB — GLUCOSE, CAPILLARY
Glucose-Capillary: 95 mg/dL (ref 70–99)
Glucose-Capillary: 119 mg/dL — ABNORMAL HIGH (ref 70–99)
Glucose-Capillary: 151 mg/dL — ABNORMAL HIGH (ref 70–99)

## 2021-04-22 LAB — MAGNESIUM: Magnesium: 1.9 mg/dL (ref 1.7–2.4)

## 2021-04-22 LAB — PHOSPHORUS: Phosphorus: 3.5 mg/dL (ref 2.5–4.6)

## 2021-04-22 MED ORDER — SODIUM CHLORIDE 0.9 % IV SOLN: INTRAVENOUS | Status: DC

## 2021-04-22 MED ORDER — DEXTROSE 5 % IV SOLN
750.0000 mg | Freq: Three times a day (TID) | INTRAVENOUS | Status: DC
  Administered 2021-04-22 – 2021-04-24 (×6): 750 mg via INTRAVENOUS
  Filled 2021-04-22 (×7): qty 15

## 2021-04-22 MED ORDER — SODIUM CHLORIDE 0.9 % IV SOLN: INTRAVENOUS | Status: DC | PRN

## 2021-04-22 NOTE — Progress Notes (Signed)
Pharmacy Antibiotic Note  Yvette Roberts is a 65 y.o. female admitted on 03/27/2021.  Rule out herpes encephalitis, IR consulted for LP.  Pharmacy has been consulted for acyclovir dosing.  Plan: Acyclovir 10 mg/kg (750mg ) IV q8h Normal Saline at 75 ml/hr.   Height: 5\' 6"  (167.6 cm) Weight: 75.5 kg (166 lb 8 oz) IBW/kg (Calculated) : 59.3 TBW is 127% of IBW  Temp (24hrs), Avg:97.6 F (36.4 C), Min:97.5 F (36.4 C), Max:97.7 F (36.5 C)  Recent Labs  Lab 04/16/21 0623 04/17/21 0639 04/18/21 0557 04/19/21 0450 04/22/21 0500  WBC 3.1* 2.2* 3.4*  --   --   CREATININE 0.37* 0.37* 0.39* 0.41* 0.42*    Estimated Creatinine Clearance: 72.8 mL/min (A) (by C-G formula based on SCr of 0.42 mg/dL (L)).    Allergies  Allergen Reactions   Eggs Or Egg-Derived Products Hives   Influenza Vaccines Hives   Peanut-Containing Drug Products Hives   Penicillins Hives   Sulfa Antibiotics Hives   Codeine Rash    Antimicrobials this admission: 5/31 fluconazole (esophagitis) >> 6/2 6/5 cefepime >> 6/11 6/26 acyclovir >>   Dose adjustments this admission:  Microbiology results: 6/5 BCx x2: ngtd 6/3 UCx: 70 k PsA (pans sens) FINAL 6/5 ucx: >100K PsA (pansens) 6/26 LP:    Thank you for allowing pharmacy to be a part of this patient's care. Gretta Arab PharmD, BCPS Clinical Pharmacist [facility] main pharmacy [phone] 04/22/2021 10:35 AM

## 2021-04-22 NOTE — Progress Notes (Signed)
[facility]  PROGRESS NOTE  Yvette Roberts GBT:[id number] DOB: 1956/10/28 DOA: 03/27/2021 PCP: [name], FNP   Brief HPI:   65 year old African-American female with past medical history of metastatic uterine cancer, diabetes mellitus type 2, hypertension, malignant pleural effusion who was admitted with odynophagia and weight loss.  She reported 50 pound weight loss in past 3 months.  Also underwent further work-up had a EGD and found to have nonsevere esophagitis in proximal and distal esophagus as well as scattered mild inflammation with erythema in the entire stomach.  She was started on Protonix and sucralfate.  Hospitalization was complicated by febrile neutropenia and Pseudomonas UTI.  Patient is s/p antibiotics treatment.    Subjective   Patient seen and examined, pleasantly confused.  S/p lumbar puncture done yesterday, HSV 1/2 PCR obtained, result is currently pending.   Assessment/Plan:    Bilateral lower extremity weakness -Unclear etiology -MRI lumbar spine showed no regional metastatic disease, showed small focus of edema and enhancement at the inferior endplate of X10 favored to be discogenic in nature.  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  Completed IVIG, no improvement in weakness.  -S/p LP.  Which showed 1 WBC, protein WNL, glucose WNL, RBC 0.  Gram stain showed no organisms.  Culture is pending. -Oligoclonal bands were zero -HSV type I/II antibody IgG CSF were elevated at 2.54.  Discussed with neurology and ID, they recommended to repeat LP to obtain CSF HSV type I/II PCR.  IR was consulted, patient underwent repeat lumbar puncture yesterday.   -We will start acyclovir per pharmacy    -Follow HSV, VDRL, cytology, IgG CSF index -Follow-up paraneoplastic panel -If unrevealing she will need outpatient EMG/NCS   Odynophagia and esophagitis -Patient underwent EGD on 03/29/2021 which showed mild nonsevere esophagitis in the proximal and distal esophagus as well as scattered mild inflammation with erythema in the entire stomach -Biopsy from stomach shows reactive gastropathy negative H. pylori. -No metaplasia dysplasia or pregnancy. -Biopsy from esophagus showed mild esophagitis, negative for fungus.  No dysplasia or metaplasia. -GI recommended continuing with Protonix twice daily and Carafate 1 g p.o. 3 times daily -Patient to follow-up with GI in the outpatient setting   Febrile neutropenia -Patient completed 7 days course of cefepime, cefepime was stopped on 04/07/2021 -No further fever noted   Pancytopenia -Stable  Hypokalemia -Replete  Hypomagnesemia -replete  Severe protein calorie malnutrition -Dietitian was consulted -Patient has started to eat, will need to monitor for refeeding syndrome -Monitor electrolytes  Metastatic uterine cancer -Followed by oncology, holding of further chemotherapy at this time -Oncology has signed off -Follow-up oncology as outpatient in about 1 month  Hypertension -Blood pressure is stable -Continue Coreg  Diabetes mellitus type 2 -CBG well controlled with -Hemoglobin A1c 6.1 -Follow-up PCP as outpatient  Malignant pleural effusion -CT scan showed loculated right-sided pleural effusion with 6.5 x 3.6 cm axial dimension as compared to 8.1x 3.9 cm. Basilar volume loss and signs of pleural thickening with decreased pleural thickening, markedly diminished since the prior PET and with continued decreased pleural thickening particularly in the inferior RIGHT chest even since the recent CT of the chest.  -Over the RIGHT hemidiaphragm pleural thickness approximately 7 mm as compared to 12 mm greatest thickness. LEFT chest is  clear. Airways are patent."  with the impression being "Decreased loculated pleural fluid and pleural thickening in the chest follows diminishing perihepatic soft tissue and capsular hepatic implants as described" -No Respiratory Distress Follow outpatient with oncology   Scheduled medications:    carvedilol  12.5 mg Oral BID WC   chlorhexidine  15 mL Mouth/Throat QID   Chlorhexidine Gluconate Cloth  6 each Topical Daily   feeding supplement  237 mL Oral TID BM   FLUoxetine  20 mg Oral Daily   folic acid  1 mg Oral Daily   gabapentin  200 mg Oral BID   Gerhardt's butt cream   Topical BID   magnesium oxide  800 mg Oral Daily   mouth rinse  15 mL Mouth Rinse q12n4p   melatonin  10 mg Oral QHS   mirtazapine  15 mg Oral QHS   multivitamin with minerals  1 tablet Oral Daily   nutrition supplement (JUVEN)  1 packet Oral BID WC   pantoprazole  40 mg Oral BID   polyethylene glycol  17 g Oral Daily   potassium chloride  40 mEq Oral Daily   protein supplement  1 Scoop Oral TID WC   sucralfate  1 g Oral TID WC & HS   thiamine  100 mg Oral Daily         Data Reviewed:   CBG:  Recent Labs  Lab 04/21/21 0728 04/21/21 1140 04/21/21 1643 04/22/21 0709 04/22/21 1145  GLUCAP 104* 117* 127* 95 119*    SpO2: 100 % O2 Flow Rate (L/min): 2 L/min    Vitals:   04/21/21 1430 04/21/21 2239 04/22/21 0513 04/22/21 1320  BP: (!) 136/93 (!) 129/92 134/90 122/78  Pulse: 92 88 98 (!) 110  Resp: 18 14 18 18   Temp: 97.7 F (36.5 C) (!) 97.5 F (36.4 C) (!) 97.5 F (36.4 C) 98.5 F (36.9 C)  TempSrc: Oral Oral Oral Oral  SpO2: 100% 96% 94% 100%  Weight:      Height:         Intake/Output Summary (Last 24 hours) at 04/22/2021 1335 Last data filed at 04/22/2021 0900 Gross per 24 hour  Intake 735.22 ml  Output 850 ml  Net -114.78 ml    06/24 1901 - 06/26 0700 In: 618.2 [P.O.:618; I.V.:0.2] Out: 1550 [Urine:1550]  Filed Weights   04/16/21 1720  Weight: 75.5 kg     CBC:  Recent Labs  Lab 04/16/21 0623 04/17/21 0639 04/18/21 0557  WBC 3.1* 2.2* 3.4*  HGB 8.9* 8.5* 8.8*  HCT 27.1* 26.2* 27.3*  PLT 195 189 194  MCV 89.1 89.4 89.5  MCH 29.3 29.0 28.9  MCHC 32.8 32.4 32.2  RDW 18.2* 18.2* 18.3*  LYMPHSABS 1.3 1.0 1.1  MONOABS 0.4 0.3 0.6  EOSABS 0.0 0.0 0.1  BASOSABS 0.0 0.0 0.0    Complete metabolic panel:  Recent Labs  Lab 04/16/21 0623 04/17/21 0639 04/18/21 0557 04/19/21 0450 04/20/21 0511 04/21/21 0500 04/22/21 0500  NA 137 134* 134* 133*  --   --  135  K 3.5 3.4* 3.4* 3.8  --   --  3.6  CL 102 100 99 99  --   --  98  CO2 29 27 29 29   --   --  30  GLUCOSE 116* 104* 106* 110*  --   --  114*  BUN 9 8 6* 10  --   --  14  CREATININE 0.37* 0.37* 0.39* 0.41*  --   --  0.42*  CALCIUM 9.2  9.0 9.2 9.1  --   --  9.4  AST 26 24 22   --   --   --  18  ALT 26 25 24   --   --   --  18  ALKPHOS 48 46 47  --   --   --  48  BILITOT 0.4 0.7 0.6  --   --   --  0.5  ALBUMIN 2.7* 2.5* 2.6*  --   --   --  2.5*  MG 1.4* 1.3* 1.3* 1.7 1.6* 1.5* 1.9    No results for input(s): LIPASE, AMYLASE in the last 168 hours.  No results for input(s): CRP, DDIMER, BNP, PROCALCITON, SARSCOV2NAA in the last 168 hours.  Invalid input(s): LACTICACID   ------------------------------------------------------------------------------------------------------------------ No results for input(s): CHOL, HDL, LDLCALC, TRIG, CHOLHDL, LDLDIRECT in the last 72 hours.  Lab Results  Component Value Date   HGBA1C 6.1 (H) 03/27/2021   ------------------------------------------------------------------------------------------------------------------ No results for input(s): TSH, T4TOTAL, T3FREE, THYROIDAB in the last 72 hours.  Invalid input(s): FREET3 ------------------------------------------------------------------------------------------------------------------ Recent Labs    04/20/21 1744  FOLATE 4.4*    Coagulation profile No results for input(s): INR,  PROTIME in the last 168 hours. No results for input(s): DDIMER in the last 72 hours.  Cardiac Enzymes No results for input(s): CKTOTAL, CKMB, CKMBINDEX, TROPONINI in the last 168 hours.   ------------------------------------------------------------------------------------------------------------------    Component Value Date/Time   BNP 75.8 02/25/2021 1625     Antibiotics: Anti-infectives (From admission, onward)    Start     Dose/Rate Route Frequency Ordered Stop   04/22/21 1200  acyclovir (ZOVIRAX) 750 mg in dextrose 5 % 150 mL IVPB        750 mg 165 mL/hr over 60 Minutes Intravenous Every 8 hours 04/22/21 1030     04/01/21 0930  ceFEPIme (MAXIPIME) 2 g in sodium chloride 0.9 % 100 mL IVPB  Status:  Discontinued        2 g 200 mL/hr over 30 Minutes Intravenous Every 8 hours 04/01/21 0839 04/07/21 1345   03/27/21 1800  fluconazole (DIFLUCAN) IVPB 200 mg  Status:  Discontinued        200 mg 100 mL/hr over 60 Minutes Intravenous Daily 03/27/21 1734 03/29/21 1633        Radiology Reports  DG FL GUIDED LUMBAR PUNCTURE  Result Date: 04/21/2021 CLINICAL DATA:  Weakness. EXAM: DIAGNOSTIC LUMBAR PUNCTURE UNDER FLUOROSCOPIC GUIDANCE COMPARISON:  Fluoroscopically guided lumbar puncture 04/16/2021. Lumbar spine MRI 04/11/2021. FLUOROSCOPY TIME:  Fluoroscopy Time:  12 seconds Radiation Exposure Index (if provided by the fluoroscopic device): 0.7 mGy Number of Acquired Spot Images: 0 PROCEDURE: Informed consent was obtained from the patient's son prior to the procedure, including potential complications of headache, allergy, and pain. With the patient prone, the lower back was prepped with Betadine. 1% Lidocaine was used for local anesthesia. Lumbar puncture was performed at the L4-5 level using a 3.5 inch 20 gauge needle via a right interlaminar approach with return of clear CSF. 6.5 mL of CSF were obtained for laboratory studies. The patient tolerated the procedure well and there were no  apparent complications. IMPRESSION: Technically successful fluoroscopically guided lumbar puncture. Electronically Signed   By: Logan Bores M.D.   On: 04/21/2021 14:41      DVT prophylaxis: Lovenox  Code Status: Full code  Family Communication: No family at bedside   Consultants: Neurology  Procedures: Lumbar puncture    Objective    Physical Examination:  General-appears in no acute distress  Heart-S1-S2, regular, no murmur auscultated Lungs-clear to auscultation bilaterally, no wheezing or crackles auscultated Abdomen-soft, nontender, no organomegaly Extremities-no edema in the lower extremities Neuro-alert, oriented to self only, pleasantly confused, motor strength 1/5 in lower extremities   Status is: Inpatient  Dispo: The patient is from: Home              Anticipated d/c is to: Skilled nursing facility              Anticipated d/c date is: 04/24/2021              Patient currently not stable for discharge  Barrier to discharge-getting IVIG, ongoing evaluation for lower extremity weakness  COVID-19 Labs  No results for input(s): DDIMER, FERRITIN, LDH, CRP in the last 72 hours.  Lab Results  Component Value Date   [place] NEGATIVE 04/14/2021   [place] NEGATIVE 04/09/2021   [place] NEGATIVE 03/28/2021   [place] NEGATIVE 11/23/2020    Microbiology  Recent Results (from the past 240 hour(s))  SARS CORONAVIRUS 2 (TAT 6-24 HRS) Nasopharyngeal Nasopharyngeal Swab     Status: None   Collection Time: 04/14/21  1:08 PM   Specimen: Nasopharyngeal Swab  Result Value Ref Range Status   SARS Coronavirus 2 NEGATIVE NEGATIVE Final    Comment: (NOTE) SARS-CoV-2 target nucleic acids are NOT DETECTED.  The SARS-CoV-2 RNA is generally detectable in upper and lower respiratory specimens during the acute phase of infection. Negative results do not preclude SARS-CoV-2 infection, do not rule out co-infections with other pathogens, and should not be used  as the sole basis for treatment or other patient management decisions. Negative results must be combined with clinical observations, patient history, and epidemiological information. The expected result is Negative.  Fact Sheet for Patients: SugarRoll.be  Fact Sheet for Healthcare Providers: https://www.woods-mathews.com/  This test is not yet approved or cleared by the [place] FDA and  has been authorized for detection and/or diagnosis of SARS-CoV-2 by FDA under an Emergency Use Authorization (EUA). This EUA will remain  in effect (meaning this test can be used) for the duration of the COVID-19 declaration under Se ction 564(b)(1) of the Act, 21 U.S.C. section 360bbb-3(b)(1), unless the authorization is terminated or revoked sooner.  Performed at [facility], [place] [address] [place], [place] [address]   CSF culture w Gram Stain     Status: None   Collection Time: 04/16/21  9:30 AM   Specimen: PATH Cytology CSF; Cerebrospinal Fluid  Result Value Ref Range Status   Specimen Description   Final    CSF Performed at [facility] [address] [place], [place] [address]    Special Requests   Final    NONE Performed at [facility], [place] [address] [place], [place] [address]    Gram Stain   Final    NO ORGANISMS SEEN Gram Stain Report Called to,Read Back By and Verified With: MELISSA RN AT 0630 ON 04/16/21 BY S.VANHOORNE Performed at [facility], [place] [address], [place], [place] [address]    Culture   Final    NO GROWTH 3 DAYS Performed at [facility], [place] [address], [place], [place] [address]    Report Status 04/19/2021 FINAL  Final    Pressure Injury 04/05/21 Sacrum Posterior;Medial;Lower Stage 2 -  Partial thickness loss of dermis presenting as a shallow open injury with a red, pink wound bed without slough. Appears to be stage two, bleeding right inside  of buttocks and upper part of (Active)  04/05/21 2030  Location: Sacrum  Location Orientation: Posterior;Medial;Lower  Staging: Stage 2 -  Partial thickness loss of dermis presenting as a shallow open injury with a red, pink wound bed without slough.  Wound Description (Comments): Appears to be stage two, bleeding right inside of buttocks and upper part of bottom  Present on Admission: No          [place]   [facility] If 7PM-7AM, please contact night-coverage at [email or web address], Office  [phone]   04/22/2021, 1:35 PM  LOS: 26 days

## 2021-04-23 LAB — BASIC METABOLIC PANEL
Anion gap: 7 (ref 5–15)
BUN: 14 mg/dL (ref 8–23)
CO2: 29 mmol/L (ref 22–32)
Calcium: 9.8 mg/dL (ref 8.9–10.3)
Chloride: 102 mmol/L (ref 98–111)
Creatinine, Ser: 0.59 mg/dL (ref 0.44–1.00)
GFR, Estimated: >60 mL/min (ref >60)
Glucose, Bld: 146 mg/dL — ABNORMAL HIGH (ref 70–99)
Potassium: 3.7 mmol/L (ref 3.5–5.1)
Sodium: 138 mmol/L (ref 135–145)

## 2021-04-23 LAB — HSV 1/2 PCR, CSF
HSV-1 DNA: NEGATIVE
HSV-2 DNA: NEGATIVE

## 2021-04-23 NOTE — Progress Notes (Signed)
[facility]  PROGRESS NOTE  Yvette LEHMKUHL IEP:[id number] DOB: 18-Dec-1955 DOA: 03/27/2021 PCP: [name], FNP   Brief HPI:   65 year old African-American female with past medical history of metastatic uterine cancer, diabetes mellitus type 2, hypertension, malignant pleural effusion who was admitted with odynophagia and weight loss.  She reported 50 pound weight loss in past 3 months.  Also underwent further work-up had a EGD and found to have nonsevere esophagitis in proximal and distal esophagus as well as scattered mild inflammation with erythema in the entire stomach.  She was started on Protonix and sucralfate.  Hospitalization was complicated by febrile neutropenia and Pseudomonas UTI.  Patient is s/p antibiotics treatment.    Subjective   Patient seen and examined, continues to be confused intermittently.   Assessment/Plan:    Bilateral lower extremity weakness -Unclear etiology -MRI lumbar spine showed no regional metastatic disease, showed small focus of edema and enhancement at the inferior endplate of Y60 favored to be discogenic in nature.  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  Completed IVIG, no improvement in weakness.  -S/p LP.  Which showed 1 WBC, protein WNL, glucose WNL, RBC 0.  Gram stain showed no organisms.  Culture is pending. -Oligoclonal bands were zero -HSV type I/II antibody IgG CSF were elevated at 2.54.  Discussed with neurology and ID, they recommended to repeat LP to obtain CSF HSV type I/II PCR.  IR was consulted, patient underwent repeat lumbar puncture on 04/22/2021 -Follow HSV type I/II PCR results -Empirically started on needle acyclovir per pharmacy   -Follow HSV, VDRL,  cytology, IgG CSF index -Follow-up paraneoplastic panel -If unrevealing she will need outpatient EMG/NCS   Odynophagia and esophagitis -Patient underwent EGD on 03/29/2021 which showed mild nonsevere esophagitis in the proximal and distal esophagus as well as scattered mild inflammation with erythema in the entire stomach -Biopsy from stomach shows reactive gastropathy negative H. pylori. -No metaplasia dysplasia or pregnancy. -Biopsy from esophagus showed mild esophagitis, negative for fungus.  No dysplasia or metaplasia. -GI recommended continuing with Protonix twice daily and Carafate 1 g p.o. 3 times daily -Patient to follow-up with GI in the outpatient setting   Febrile neutropenia -Patient completed 7 days course of cefepime, cefepime was stopped on 04/07/2021 -No further fever noted   Pancytopenia -Stable  Hypokalemia -Replete  Hypomagnesemia -replete  Severe protein calorie malnutrition -Dietitian was consulted -Patient has started to eat, will need to monitor for refeeding syndrome -Monitor electrolytes  Metastatic uterine cancer -Followed by oncology, holding of further chemotherapy at this time -Oncology has signed off -Follow-up oncology as outpatient in about 1 month  Hypertension -Blood pressure is stable -Continue Coreg  Diabetes mellitus type 2 -CBG well controlled with -Hemoglobin A1c 6.1 -Follow-up PCP as outpatient  Malignant pleural effusion -CT scan showed loculated right-sided pleural effusion with 6.5 x 3.6 cm axial dimension as compared to 8.1x 3.9 cm. Basilar volume loss and signs of pleural thickening with decreased pleural thickening, markedly diminished since the prior PET and with continued decreased pleural thickening particularly in the inferior RIGHT chest even since the recent CT of the chest.  -Over the RIGHT hemidiaphragm pleural thickness approximately 7 mm as compared to 12 mm greatest thickness. LEFT chest is clear. Airways are  patent." with the impression being "Decreased  loculated pleural fluid and pleural thickening in the chest follows diminishing perihepatic soft tissue and capsular hepatic implants as described" -No Respiratory Distress Follow outpatient with oncology   Scheduled medications:    carvedilol  12.5 mg Oral BID WC   chlorhexidine  15 mL Mouth/Throat QID   Chlorhexidine Gluconate Cloth  6 each Topical Daily   feeding supplement  237 mL Oral TID BM   FLUoxetine  20 mg Oral Daily   folic acid  1 mg Oral Daily   gabapentin  200 mg Oral BID   Gerhardt's butt cream   Topical BID   magnesium oxide  800 mg Oral Daily   mouth rinse  15 mL Mouth Rinse q12n4p   melatonin  10 mg Oral QHS   mirtazapine  15 mg Oral QHS   multivitamin with minerals  1 tablet Oral Daily   nutrition supplement (JUVEN)  1 packet Oral BID WC   pantoprazole  40 mg Oral BID   polyethylene glycol  17 g Oral Daily   potassium chloride  40 mEq Oral Daily   protein supplement  1 Scoop Oral TID WC   sucralfate  1 g Oral TID WC & HS   thiamine  100 mg Oral Daily         Data Reviewed:   CBG:  Recent Labs  Lab 04/21/21 1140 04/21/21 1643 04/22/21 0709 04/22/21 1145 04/22/21 1623  GLUCAP 117* 127* 95 119* 151*    SpO2: 96 % O2 Flow Rate (L/min): 2 L/min    Vitals:   04/22/21 1320 04/22/21 2131 04/23/21 0517 04/23/21 1433  BP: 122/78 126/85 128/82 (!) 134/94  Pulse: (!) 110 98 (!) 103 (!) 101  Resp: 18 18 20 16   Temp: 98.5 F (36.9 C) 98.9 F (37.2 C) 98.2 F (36.8 C) 97.9 F (36.6 C)  TempSrc: Oral Oral Oral Oral  SpO2: 100% 98% 96% 96%  Weight:      Height:         Intake/Output Summary (Last 24 hours) at 04/23/2021 1643 Last data filed at 04/23/2021 1433 Gross per 24 hour  Intake 1550.14 ml  Output 1750 ml  Net -199.86 ml    06/25 1901 - 06/27 0700 In: 1885.1 [P.O.:453; I.V.:937.1] Out: 1600 [Urine:1600]  Filed Weights   04/16/21 1720  Weight: 75.5 kg    CBC:  Recent Labs  Lab  04/17/21 0639 04/18/21 0557  WBC 2.2* 3.4*  HGB 8.5* 8.8*  HCT 26.2* 27.3*  PLT 189 194  MCV 89.4 89.5  MCH 29.0 28.9  MCHC 32.4 32.2  RDW 18.2* 18.3*  LYMPHSABS 1.0 1.1  MONOABS 0.3 0.6  EOSABS 0.0 0.1  BASOSABS 0.0 0.0    Complete metabolic panel:  Recent Labs  Lab 04/17/21 0639 04/18/21 0557 04/19/21 0450 04/20/21 0511 04/21/21 0500 04/22/21 0500 04/23/21 0452  NA 134* 134* 133*  --   --  135 138  K 3.4* 3.4* 3.8  --   --  3.6 3.7  CL 100 99 99  --   --  98 102  CO2 27 29 29   --   --  30 29  GLUCOSE 104* 106* 110*  --   --  114* 146*  BUN 8 6* 10  --   --  14 14  CREATININE 0.37* 0.39* 0.41*  --   --  0.42* 0.59  CALCIUM 9.0 9.2 9.1  --   --  9.4 9.8  AST 24 22  --   --   --  18  --   ALT 25 24  --   --   --  18  --   ALKPHOS 46 47  --   --   --  48  --   BILITOT 0.7 0.6  --   --   --  0.5  --   ALBUMIN 2.5* 2.6*  --   --   --  2.5*  --   MG 1.3* 1.3* 1.7 1.6* 1.5* 1.9  --     No results for input(s): LIPASE, AMYLASE in the last 168 hours.  No results for input(s): CRP, DDIMER, BNP, PROCALCITON, SARSCOV2NAA in the last 168 hours.  Invalid input(s): LACTICACID   ------------------------------------------------------------------------------------------------------------------ No results for input(s): CHOL, HDL, LDLCALC, TRIG, CHOLHDL, LDLDIRECT in the last 72 hours.  Lab Results  Component Value Date   HGBA1C 6.1 (H) 03/27/2021   ------------------------------------------------------------------------------------------------------------------ No results for input(s): TSH, T4TOTAL, T3FREE, THYROIDAB in the last 72 hours.  Invalid input(s): FREET3 ------------------------------------------------------------------------------------------------------------------ Recent Labs    04/20/21 1744  FOLATE 4.4*    Coagulation profile No results for input(s): INR, PROTIME in the last 168 hours. No results for input(s): DDIMER in the last 72 hours.  Cardiac  Enzymes No results for input(s): CKTOTAL, CKMB, CKMBINDEX, TROPONINI in the last 168 hours.   ------------------------------------------------------------------------------------------------------------------    Component Value Date/Time   BNP 75.8 02/25/2021 1625     Antibiotics: Anti-infectives (From admission, onward)    Start     Dose/Rate Route Frequency Ordered Stop   04/22/21 1200  acyclovir (ZOVIRAX) 750 mg in dextrose 5 % 150 mL IVPB        750 mg 165 mL/hr over 60 Minutes Intravenous Every 8 hours 04/22/21 1030     04/01/21 0930  ceFEPIme (MAXIPIME) 2 g in sodium chloride 0.9 % 100 mL IVPB  Status:  Discontinued        2 g 200 mL/hr over 30 Minutes Intravenous Every 8 hours 04/01/21 0839 04/07/21 1345   03/27/21 1800  fluconazole (DIFLUCAN) IVPB 200 mg  Status:  Discontinued        200 mg 100 mL/hr over 60 Minutes Intravenous Daily 03/27/21 1734 03/29/21 1633        Radiology Reports  No results found.    DVT prophylaxis: Lovenox  Code Status: Full code  Family Communication: No family at bedside   Consultants: Neurology  Procedures: Lumbar puncture    Objective    Physical Examination:  General-appears in no acute distress, confused Heart-S1-S2, regular, no murmur auscultated Lungs-clear to auscultation bilaterally, no wheezing or crackles auscultated Abdomen-soft, nontender, no organomegaly Extremities-no edema in the lower extremities Neuro-alert, oriented x2, motor strength 1/5 in lower extremities   Status is: Inpatient  Dispo: The patient is from: Home              Anticipated d/c is to: Skilled nursing facility              Anticipated d/c date is: 04/24/2021              Patient currently not stable for discharge  Barrier to discharge-getting IVIG, ongoing evaluation for lower extremity weakness  COVID-19 Labs  No results for input(s): DDIMER, FERRITIN, LDH, CRP in the last 72 hours.  Lab Results  Component Value Date    [place] NEGATIVE 04/14/2021   [place] NEGATIVE 04/09/2021   [place] NEGATIVE 03/28/2021   [place] NEGATIVE 11/23/2020    Microbiology  Recent Results (from the past 240 hour(s))  SARS  CORONAVIRUS 2 (TAT 6-24 HRS) Nasopharyngeal Nasopharyngeal Swab     Status: None   Collection Time: 04/14/21  1:08 PM   Specimen: Nasopharyngeal Swab  Result Value Ref Range Status   SARS Coronavirus 2 NEGATIVE NEGATIVE Final    Comment: (NOTE) SARS-CoV-2 target nucleic acids are NOT DETECTED.  The SARS-CoV-2 RNA is generally detectable in upper and lower respiratory specimens during the acute phase of infection. Negative results do not preclude SARS-CoV-2 infection, do not rule out co-infections with other pathogens, and should not be used as the sole basis for treatment or other patient management decisions. Negative results must be combined with clinical observations, patient history, and epidemiological information. The expected result is Negative.  Fact Sheet for Patients: SugarRoll.be  Fact Sheet for Healthcare Providers: https://www.woods-mathews.com/  This test is not yet approved or cleared by the [place] FDA and  has been authorized for detection and/or diagnosis of SARS-CoV-2 by FDA under an Emergency Use Authorization (EUA). This EUA will remain  in effect (meaning this test can be used) for the duration of the COVID-19 declaration under Se ction 564(b)(1) of the Act, 21 U.S.C. section 360bbb-3(b)(1), unless the authorization is terminated or revoked sooner.  Performed at [facility], [place] [address], [place], [place] [address]   CSF culture w Gram Stain     Status: None   Collection Time: 04/16/21  9:30 AM   Specimen: PATH Cytology CSF; Cerebrospinal Fluid  Result Value Ref Range Status   Specimen Description   Final    CSF Performed at [place] [address], [place], [place]  [address]    Special Requests   Final    NONE Performed at [facility], [place] [address] [place], [place] [address]    Gram Stain   Final    NO ORGANISMS SEEN Gram Stain Report Called to,Read Back By and Verified With: MELISSA RN AT 1601 ON 04/16/21 BY S.VANHOORNE Performed at [facility], [place] [address] [place], [place] [address]    Culture   Final    NO GROWTH 3 DAYS Performed at [facility], [place] [address], [place], [place] [address]    Report Status 04/19/2021 FINAL  Final    Pressure Injury 04/05/21 Sacrum Posterior;Medial;Lower Stage 2 -  Partial thickness loss of dermis presenting as a shallow open injury with a red, pink wound bed without slough. Appears to be stage two, bleeding right inside of buttocks and upper part of (Active)  04/05/21 2030  Location: Sacrum  Location Orientation: Posterior;Medial;Lower  Staging: Stage 2 -  Partial thickness loss of dermis presenting as a shallow open injury with a red, pink wound bed without slough.  Wound Description (Comments): Appears to be stage two, bleeding right inside of buttocks and upper part of bottom  Present on Admission: No          [place]   [facility] If 7PM-7AM, please contact night-coverage at [email or web address], Office  [phone]   04/23/2021, 4:43 PM  LOS: 27 days

## 2021-04-23 NOTE — Progress Notes (Signed)
Physical Therapy Treatment Patient Details Name: AGNESS SIBRIAN MRN: [id number] DOB: [birth year] Today's Date: 04/23/2021    History of Present Illness ANNELI BING is a 65 year old female presents with severe odynophagia the resultant minimal intake and profound weight loss. EGD on 03/29/21 which showed mild nonsevere esophagitis. PMH: hypertension, COPD, diabetes, stage IV uterine cancer, history of malignant pleural effusion diagnosed in 2/22. Profound weakness.    PT Comments    General Comments: AxO x 1.5 intermittent confusion and following one step commands 50% of time with repeat VC's to stay on task. Pt stated she got into an argument with her Doctor this morning about wanting to see her brother.  "I have not seen him in a long while". Assisted pt to EOB to perform sitting static/dynamic activities. General bed mobility comments: pt required Total Assist + 2 to transition from supine to EOB.  Pt <5% self initiated.  Performed static and dynamic sitting balance activity which consisted of cross midline reaching, trunk felex and rotation.  Pt tolerated sitting EOB unsupported but with close MinGuard Assist to prevent LOB x 10 min.  Using a bright yellow sock balled up, had pt reaching and grasping with first R UE then L.  Pt had difficulty self controlling (gross motor control) B  UE's while maintaining upright sitting balance.  Pt also had difficulty with grasping (fine motor) her drink from bedside table which was directly in front of her to her mouth.  Required hand held assist to complete.  After sitting EOB x 10 min pt c/o MAX fatigue and was assisted back to supine + 2 Total Assist.   Follow Up Recommendations  SNF     Equipment Recommendations  None recommended by PT    Recommendations for Other Services       Precautions / Restrictions Precautions Precautions: Fall Precaution Comments: B LE  paresis (profound weakness)    Mobility  Bed Mobility Overal bed mobility: Needs  Assistance Bed Mobility: Supine to Sit;Sit to Supine     Supine to sit: Total assist;+2 for physical assistance;+2 for safety/equipment Sit to supine: Total assist;+2 for physical assistance;+2 for safety/equipment   General bed mobility comments: pt required Total Assist + 2 to transition from supine to EOB.  Pt <5% self initiated.  Performed static and dynamic sitting balance activity which consisted of cross midline reaching, trunk felex and rotation.  Pt tolerated sitting EOB unsupported but with close MinGuard Assist to prevent LOB x 10 min.  Using a bright yellow sock balled up, had pt reaching and grasping with first R UE then L.  Pt had difficulty self controlling (gross motor control) B  UE's while maintaining upright sitting balance.  Pt also had difficulty with grasping (fine motor) her drink from bedside table which was directly in front of her to her mouth.  Required hand held assist to complete.  After sitting EOB x 10 min pt c/o MAX fatigue and was assisted back to supine + 2 Total Assist.    Transfers                 General transfer comment: did not attempt transfers this session due to profound weakness.  Ambulation/Gait                 Stairs             Wheelchair Mobility    Modified Rankin (Stroke Patients Only)       Balance  Cognition Arousal/Alertness: Awake/alert Behavior During Therapy: Flat affect Overall Cognitive Status: Impaired/Different from baseline Area of Impairment: Following commands;Problem solving                     Memory: Decreased short-term memory Following Commands: Follows one step commands inconsistently;Follows one step commands with increased time     Problem Solving: Requires verbal cues;Requires tactile cues;Slow processing General Comments: AxO x 1.5 intermittent confusion and following one step commands 50% of time with repeat VC's to  stay on task.      Exercises      General Comments        Pertinent Vitals/Pain Pain Assessment: Faces Faces Pain Scale: Hurts a little bit Pain Location: General, no specific area Pain Descriptors / Indicators: Grimacing    Home Living                      Prior Function            PT Goals (current goals can now be found in the care plan section) Progress towards PT goals: Progressing toward goals    Frequency    Min 2X/week      PT Plan Current plan remains appropriate    Co-evaluation              AM-PAC PT "6 Clicks" Mobility   Outcome Measure  Help needed turning from your back to your side while in a flat bed without using bedrails?: Total Help needed moving from lying on your back to sitting on the side of a flat bed without using bedrails?: Total Help needed moving to and from a bed to a chair (including a wheelchair)?: Total Help needed standing up from a chair using your arms (e.g., wheelchair or bedside chair)?: Total Help needed to walk in hospital room?: Total Help needed climbing 3-5 steps with a railing? : Total 6 Click Score: 6    End of Session Equipment Utilized During Treatment: Gait belt Activity Tolerance: Patient limited by fatigue Patient left: in bed;with call bell/phone within reach;with bed alarm set Nurse Communication: Mobility status;Need for lift equipment PT Visit Diagnosis: Other abnormalities of gait and mobility (R26.89);Muscle weakness (generalized) (M62.81)     Time: 3976-7341 PT Time Calculation (min) (ACUTE ONLY): 24 min  Charges:  $Therapeutic Exercise: 8-22 mins $Therapeutic Activity: 8-22 mins                     Rica Koyanagi  PTA Acute  Rehabilitation Services Pager      [phone] Office      [phone]

## 2021-04-24 LAB — PROTEIN ELECTROPHORESIS, SERUM
A/G Ratio: 0.5 — ABNORMAL LOW (ref 0.7–1.7)
Albumin ELP: 2.4 g/dL — ABNORMAL LOW (ref 2.9–4.4)
Alpha-1-Globulin: 0.3 g/dL (ref 0.0–0.4)
Alpha-2-Globulin: 0.9 g/dL (ref 0.4–1.0)
Beta Globulin: 0.7 g/dL (ref 0.7–1.3)
Gamma Globulin: 3.3 g/dL — ABNORMAL HIGH (ref 0.4–1.8)
Globulin, Total: 5.2 g/dL — ABNORMAL HIGH (ref 2.2–3.9)
Total Protein ELP: 7.6 g/dL (ref 6.0–8.5)

## 2021-04-24 NOTE — Progress Notes (Signed)
Pt. With elevated BP, has schedule coreg bid with meals. Clarene Essex NP on called notified, awaiting response.

## 2021-04-24 NOTE — Progress Notes (Signed)
Nutrition Follow-up  INTERVENTION:   -Continue Ensure Enlive po TID, each supplement provides 350 kcal and 20 grams of protein   -Continue Juven Fruit Punch BID, each serving provides 95kcal and 2.5g of protein (amino acids glutamine and arginine)   -Continue Beneprotein TID with meals, each provides 25 kcals and 6g protein-  NUTRITION DIAGNOSIS:   Inadequate oral intake related to cancer and cancer related treatments, decreased appetite (odynophagia) as evidenced by per patient/family report.  Ongoing.  GOAL:   Patient will meet greater than or equal to 90% of their needs  Progressing  MONITOR:   PO intake, Supplement acceptance, Labs, Weight trends, I & O's  ASSESSMENT:   65 year old African-American female with a past medical history significant for but not limited to hypertension, type 2 diabetes mellitus, stage IV uterine cancer, history malignant pleural effusion diagnosed in February 2022 followed by Dr. Simeon Craft such currently on chemotherapy who had multiple symptoms most notably severe odynophagia the resultant minimal intake and profound weight loss.  6/20 s/p L3-L4 LP, 16.6ml CSF obtained for lab studies; IVIG started 6/24: IVIG completed  Patient currently consuming 0-30% of meals. Accepting protein supplements ~50% of the time. Per MD note, plan is for SNF at discharge.  Last  recorded weight 6/20: 166 lbs  Medications: Folic acid, MAG-OX, Remeron, Multivitamin with minerals daily, Miralax, KLOR-CON, Carafate, Thiamine  Labs reviewed:  CBGs: 95-151  Diet Order:   Diet Order             DIET SOFT           Diet - low sodium heart healthy           DIET SOFT Room service appropriate? Yes; Fluid consistency: Thin  Diet effective now                   EDUCATION NEEDS:   No education needs have been identified at this time  Skin:  Skin Assessment: Skin Integrity Issues: Skin Integrity Issues:: Stage II Stage II: sacrum  Last BM:   6/18  Height:   Ht Readings from Last 1 Encounters:  04/10/21 5\' 6"  (1.676 m)    Weight:   Wt Readings from Last 1 Encounters:  04/16/21 75.5 kg    BMI:  Body mass index is 26.87 kg/m.  Estimated Nutritional Needs:   Kcal:  2100-2300  Protein:  105-115g  Fluid:  2.1L/day  Clayton Bibles, MS, RD, LDN Inpatient Clinical Dietitian Contact information available via Amion

## 2021-04-24 NOTE — Progress Notes (Signed)
[facility]  PROGRESS NOTE  Yvette Roberts EQA:[id number] DOB: 14-Jul-1956 DOA: 03/27/2021 PCP: [name], FNP   Brief HPI:   65 year old African-American female with past medical history of metastatic uterine cancer, diabetes mellitus type 2, hypertension, malignant pleural effusion who was admitted with odynophagia and weight loss.  She reported 50 pound weight loss in past 3 months.  Also underwent further work-up had a EGD and found to have nonsevere esophagitis in proximal and distal esophagus as well as scattered mild inflammation with erythema in the entire stomach.  She was started on Protonix and sucralfate.  Hospitalization was complicated by febrile neutropenia and Pseudomonas UTI.  Patient is s/p antibiotics treatment.    Subjective   Patient seen and examined, continues to be confused.  HSV type I/II PCR is negative.   Assessment/Plan:    Bilateral lower extremity weakness -Unclear etiology -MRI lumbar spine showed no regional metastatic disease, showed small focus of edema and enhancement at the inferior endplate of L79 favored to be discogenic in nature.  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  Completed IVIG, no improvement in weakness.  -S/p LP.  Which showed 1 WBC, protein WNL, glucose WNL, RBC 0.  Gram stain showed no organisms.  Culture is pending. -Oligoclonal bands were zero -HSV type I/II antibody IgG CSF were elevated at 2.54.  Discussed with neurology and ID, they recommended to repeat LP to obtain CSF HSV type I/II PCR.  IR was consulted, patient underwent repeat lumbar puncture on 04/22/2021 -Patient started on empiric acyclovir; will discontinue acyclovir as HSV type I/II PCR is  negative  -Follow  VDRL, cytology, IgG CSF index, vitamin B6, protein electrophoresis -Folate is 4.4, will start supplementation -Follow-up paraneoplastic panel -If unrevealing she will need outpatient EMG/NCS   Odynophagia and esophagitis -Patient underwent EGD on 03/29/2021 which showed mild nonsevere esophagitis in the proximal and distal esophagus as well as scattered mild inflammation with erythema in the entire stomach -Biopsy from stomach shows reactive gastropathy negative H. pylori. -No metaplasia dysplasia or pregnancy. -Biopsy from esophagus showed mild esophagitis, negative for fungus.  No dysplasia or metaplasia. -GI recommended continuing with Protonix twice daily and Carafate 1 g p.o. 3 times daily -Patient to follow-up with GI in the outpatient setting   Febrile neutropenia -Patient completed 7 days course of cefepime, cefepime was stopped on 04/07/2021 -No further fever noted   Pancytopenia -Stable  Hypokalemia -Replete  Hypomagnesemia -replete  Severe protein calorie malnutrition -Dietitian was consulted -Patient has started to eat, will need to monitor for refeeding syndrome -Monitor electrolytes  Metastatic uterine cancer -Followed by oncology, holding of further chemotherapy at this time -Oncology has signed off -Follow-up oncology as outpatient in about 1 month  Hypertension -Blood pressure is stable -Continue Coreg  Diabetes mellitus type 2 -CBG well controlled with -Hemoglobin A1c 6.1 -Follow-up PCP as outpatient  Malignant pleural effusion -CT scan showed loculated right-sided pleural effusion with 6.5 x 3.6 cm axial dimension as compared to 8.1x 3.9 cm. Basilar volume loss and signs of pleural thickening with decreased pleural thickening, markedly diminished since the prior PET and with continued decreased pleural thickening particularly in the inferior RIGHT chest even since the recent CT of the chest.  -Over the RIGHT hemidiaphragm pleural  thickness approximately 7 mm as compared  to 12 mm greatest thickness. LEFT chest is clear. Airways are patent." with the impression being "Decreased loculated pleural fluid and pleural thickening in the chest follows diminishing perihepatic soft tissue and capsular hepatic implants as described" -No Respiratory Distress Follow outpatient with oncology  Goals of care -Family requested palliative care consultation; palliative care consulted -We will follow the recommendations   Scheduled medications:    carvedilol  12.5 mg Oral BID WC   chlorhexidine  15 mL Mouth/Throat QID   Chlorhexidine Gluconate Cloth  6 each Topical Daily   feeding supplement  237 mL Oral TID BM   FLUoxetine  20 mg Oral Daily   folic acid  1 mg Oral Daily   gabapentin  200 mg Oral BID   Gerhardt's butt cream   Topical BID   magnesium oxide  800 mg Oral Daily   mouth rinse  15 mL Mouth Rinse q12n4p   melatonin  10 mg Oral QHS   mirtazapine  15 mg Oral QHS   multivitamin with minerals  1 tablet Oral Daily   nutrition supplement (JUVEN)  1 packet Oral BID WC   pantoprazole  40 mg Oral BID   polyethylene glycol  17 g Oral Daily   potassium chloride  40 mEq Oral Daily   protein supplement  1 Scoop Oral TID WC   sucralfate  1 g Oral TID WC & HS   thiamine  100 mg Oral Daily         Data Reviewed:   CBG:  Recent Labs  Lab 04/21/21 1140 04/21/21 1643 04/22/21 0709 04/22/21 1145 04/22/21 1623  GLUCAP 117* 127* 95 119* 151*    SpO2: 98 % O2 Flow Rate (L/min): 2 L/min    Vitals:   04/23/21 1433 04/23/21 2200 04/24/21 0510 04/24/21 0513  BP: (!) 134/94 136/89 (!) 136/101 (!) 143/90  Pulse: (!) 101 95 (!) 106 (!) 106  Resp: 16 17 17 16   Temp: 97.9 F (36.6 C) 99.4 F (37.4 C) 99 F (37.2 C) 98.9 F (37.2 C)  TempSrc: Oral Oral Oral Oral  SpO2: 96% 97% 97% 98%  Weight:      Height:         Intake/Output Summary (Last 24 hours) at 04/24/2021 1412 Last data filed at 04/24/2021 1020 Gross per  24 hour  Intake 100 ml  Output 1150 ml  Net -1050 ml    06/26 1901 - 06/28 0700 In: 1432.1 [I.V.:937.1] Out: 1900 [Urine:1900]  Filed Weights   04/16/21 1720  Weight: 75.5 kg    CBC:  Recent Labs  Lab 04/18/21 0557  WBC 3.4*  HGB 8.8*  HCT 27.3*  PLT 194  MCV 89.5  MCH 28.9  MCHC 32.2  RDW 18.3*  LYMPHSABS 1.1  MONOABS 0.6  EOSABS 0.1  BASOSABS 0.0    Complete metabolic panel:  Recent Labs  Lab 04/18/21 0557 04/19/21 0450 04/20/21 0511 04/21/21 0500 04/22/21 0500 04/23/21 0452  NA 134* 133*  --   --  135 138  K 3.4* 3.8  --   --  3.6 3.7  CL 99 99  --   --  98 102  CO2 29 29  --   --  30 29  GLUCOSE 106* 110*  --   --  114* 146*  BUN 6* 10  --   --  14 14  CREATININE 0.39* 0.41*  --   --  0.42* 0.59  CALCIUM 9.2 9.1  --   --  9.4  9.8  AST 22  --   --   --  18  --   ALT 24  --   --   --  18  --   ALKPHOS 47  --   --   --  48  --   BILITOT 0.6  --   --   --  0.5  --   ALBUMIN 2.6*  --   --   --  2.5*  --   MG 1.3* 1.7 1.6* 1.5* 1.9  --     No results for input(s): LIPASE, AMYLASE in the last 168 hours.  No results for input(s): CRP, DDIMER, BNP, PROCALCITON, SARSCOV2NAA in the last 168 hours.  Invalid input(s): LACTICACID   ------------------------------------------------------------------------------------------------------------------ No results for input(s): CHOL, HDL, LDLCALC, TRIG, CHOLHDL, LDLDIRECT in the last 72 hours.  Lab Results  Component Value Date   HGBA1C 6.1 (H) 03/27/2021   ------------------------------------------------------------------------------------------------------------------ No results for input(s): TSH, T4TOTAL, T3FREE, THYROIDAB in the last 72 hours.  Invalid input(s): FREET3 ------------------------------------------------------------------------------------------------------------------ No results for input(s): VITAMINB12, FOLATE, FERRITIN, TIBC, IRON, RETICCTPCT in the last 72 hours.   Coagulation  profile No results for input(s): INR, PROTIME in the last 168 hours. No results for input(s): DDIMER in the last 72 hours.  Cardiac Enzymes No results for input(s): CKTOTAL, CKMB, CKMBINDEX, TROPONINI in the last 168 hours.   ------------------------------------------------------------------------------------------------------------------    Component Value Date/Time   BNP 75.8 02/25/2021 1625     Antibiotics: Anti-infectives (From admission, onward)    Start     Dose/Rate Route Frequency Ordered Stop   04/22/21 1200  acyclovir (ZOVIRAX) 750 mg in dextrose 5 % 150 mL IVPB  Status:  Discontinued        750 mg 165 mL/hr over 60 Minutes Intravenous Every 8 hours 04/22/21 1030 04/24/21 1040   04/01/21 0930  ceFEPIme (MAXIPIME) 2 g in sodium chloride 0.9 % 100 mL IVPB  Status:  Discontinued        2 g 200 mL/hr over 30 Minutes Intravenous Every 8 hours 04/01/21 0839 04/07/21 1345   03/27/21 1800  fluconazole (DIFLUCAN) IVPB 200 mg  Status:  Discontinued        200 mg 100 mL/hr over 60 Minutes Intravenous Daily 03/27/21 1734 03/29/21 1633        Radiology Reports  No results found.    DVT prophylaxis: Lovenox  Code Status: Full code  Family Communication: No family at bedside   Consultants: Neurology  Procedures: Lumbar puncture    Objective    Physical Examination:  General-appears in no acute distress, continues to be confused Heart-S1-S2, regular, no murmur auscultated Lungs-clear to auscultation bilaterally, no wheezing or crackles auscultated Abdomen-soft, nontender, no organomegaly Extremities-no edema in the lower extremities Neuro-alert, oriented x3, motor strength 1/5 in lower extremities   Status is: Inpatient  Dispo: The patient is from: Home              Anticipated d/c is to: Skilled nursing facility              Anticipated d/c date is: 04/25/2021              Patient currently not stable for discharge  Barrier to discharge-getting  IVIG, ongoing evaluation for lower extremity weakness  COVID-19 Labs  No results for input(s): DDIMER, FERRITIN, LDH, CRP in the last 72 hours.  Lab Results  Component Value Date   SARSCOV2NAA NEGATIVE 04/14/2021   [place] NEGATIVE 04/09/2021   [place] NEGATIVE 03/28/2021  [place] NEGATIVE 11/23/2020    Microbiology  Recent Results (from the past 240 hour(s))  CSF culture w Gram Stain     Status: None   Collection Time: 04/16/21  9:30 AM   Specimen: PATH Cytology CSF; Cerebrospinal Fluid  Result Value Ref Range Status   Specimen Description   Final    CSF Performed at [facility], [place] [address], [place], [place] [address]    Special Requests   Final    NONE Performed at [facility], [place] [address] [place], [place] [address]    Gram Stain   Final    NO ORGANISMS SEEN Gram Stain Report Called to,Read Back By and Verified With: MELISSA RN AT 3094 ON 04/16/21 BY S.VANHOORNE Performed at [facility], [place] [address], [place], [place] [address]    Culture   Final    NO GROWTH 3 DAYS Performed at [facility], [place] [address], [place], [place] [address]    Report Status 04/19/2021 FINAL  Final    Pressure Injury 04/05/21 Sacrum Posterior;Medial;Lower Stage 2 -  Partial thickness loss of dermis presenting as a shallow open injury with a red, pink wound bed without slough. Appears to be stage two, bleeding right inside of buttocks and upper part of (Active)  04/05/21 2030  Location: Sacrum  Location Orientation: Posterior;Medial;Lower  Staging: Stage 2 -  Partial thickness loss of dermis presenting as a shallow open injury with a red, pink wound bed without slough.  Wound Description (Comments): Appears to be stage two, bleeding right inside of buttocks and upper part of bottom  Present on Admission: No          [place]   [facility] If 7PM-7AM, please contact night-coverage at  [email or web address], Office  [phone]   04/24/2021, 2:12 PM  LOS: 28 days

## 2021-04-25 DIAGNOSIS — Z515 Encounter for palliative care: Secondary | ICD-10-CM

## 2021-04-25 DIAGNOSIS — Z7189 Other specified counseling: Secondary | ICD-10-CM

## 2021-04-25 NOTE — Progress Notes (Signed)
Occupational Therapy Treatment Patient Details Name: Yvette Roberts MRN: [id number] DOB: January 08, 1956 Today's Date: 04/25/2021    History of present illness Yvette Roberts is a 65 year old female presents with severe odynophagia the resultant minimal intake and profound weight loss. EGD on 03/29/21 which showed mild nonsevere esophagitis. PMH: hypertension, COPD, diabetes, stage IV uterine cancer, history of malignant pleural effusion diagnosed in 2/22. Profound weakness.   OT comments  Patient semi-supine in bed upon arrival, much more confused this session that previous OT session. Patient often pointing to things in room asking "I need that" or "what's that" however unable to specify what she is referring to/pointing at. Patient also making statements like "I need to go see grandma" and is unaware she is in the hospital "I won't go back there." Patient distractible and not initiating bed mobility needing total A x2. Initial posterior lean needing mod A and repositioning of hips. Patient progress to fair static balance however decompensates to poor balance with minor challenges. Attempt to have patient drink from modified cup with handle however patient keeps dropping and refusing to try and grasp cup with L hand therefore total A to drink iced tea. Due to cognition did not attempt sit to stand with stedy today, total A to return to bed.   Per chart review patient's family requesting palliative consult. Patient with inconsistent mentation and progress with acute OT services, will follow for few more sessions for [place] and may need to D/C services pending any progress.    Follow Up Recommendations  SNF    Equipment Recommendations  Other (comment) (defer to next venue)       Precautions / Restrictions Precautions Precautions: Fall Precaution Comments: B LE  paresis (profound weakness)       Mobility Bed Mobility Overal bed mobility: Needs Assistance Bed Mobility: Supine to Sit;Sit to Supine      Supine to sit: Total assist;+2 for physical assistance Sit to supine: Total assist;+2 for physical assistance   General bed mobility comments: provide cues however patient does not initiate needing total A x2 for LEs and trunk control to sit edge of bed. initially patient needing up to mod A for trunk support however with positioning of hips patient able to maintain static sitting with close supervision    Transfers                 General transfer comment: deferred for safety as patient is very distractible not following commands consistently and having trouble maintaining sitting posture with any minor challenge    Balance Overall balance assessment: History of Falls;Needs assistance Sitting-balance support: Feet supported Sitting balance-Leahy Scale: Fair Sitting balance - Comments: initial poor then progress to fair with repositioning                                   ADL either performed or assessed with clinical judgement   ADL   Eating/Feeding: Sitting;Total assistance Eating/Feeding Details (indicate cue type and reason): note that red foam handles are missing from patient's room, RN states has not seen them all shift. tried adaptive cup with handle and sip lid however patient kept dropping and would not try to hold onto despite encouragement needing total A to bring cup to mouth to drink tea  Cognition Arousal/Alertness: Awake/alert Behavior During Therapy: Restless Overall Cognitive Status: Impaired/Different from baseline Area of Impairment: Orientation;Attention;Memory;Following commands;Safety/judgement;Awareness;Problem solving                 Orientation Level: Disoriented to;Place;Time;Situation Current Attention Level: Focused Memory: Decreased short-term memory Following Commands: Follows one step commands inconsistently Safety/Judgement: Decreased awareness of  safety;Decreased awareness of deficits Awareness: Intellectual Problem Solving: Requires verbal cues;Requires tactile cues;Slow processing General Comments: keeps asking to go see her grandma, will point randomly to places in room asking "whats that" or "I need that over there" however not pointing to specific items. very distractible needing max cues to redirect to tasks. when OT states shes in the hospital patient replies "I'm not going back to the hospital"                   Pertinent Vitals/ Pain       Pain Assessment: Faces Faces Pain Scale: Hurts little more Pain Location: hands, legs Pain Descriptors / Indicators: Grimacing Pain Intervention(s): Monitored during session         Frequency  Min 2X/week        Progress Toward Goals  OT Goals(current goals can now be found in the care plan section)  Progress towards OT goals: Not progressing toward goals - comment (fluctuating cognition)  Acute Rehab OT Goals Patient Stated Goal: "go see grandma" OT Goal Formulation: With patient Time For Goal Achievement: 04/28/21 Potential to Achieve Goals: Fair ADL Goals Pt Will Transfer to Toilet: with transfer board;squat pivot transfer;with mod assist Pt/caregiver will Perform Home Exercise Program: Increased ROM;Increased strength;Both right and left upper extremity;With minimal assist  Plan Discharge plan remains appropriate       AM-PAC OT "6 Clicks" Daily Activity     Outcome Measure   Help from another person eating meals?: Total Help from another person taking care of personal grooming?: Total Help from another person toileting, which includes using toliet, bedpan, or urinal?: Total Help from another person bathing (including washing, rinsing, drying)?: Total Help from another person to put on and taking off regular upper body clothing?: Total Help from another person to put on and taking off regular lower body clothing?: Total 6 Click Score: 6    End of Session     OT Visit Diagnosis: Hemiplegia and hemiparesis;History of falling (Z91.81);Muscle weakness (generalized) (M62.81);Other symptoms and signs involving cognitive function Hemiplegia - caused by: Unspecified   Activity Tolerance Patient limited by fatigue (Treatment limited 2* cognition)   Patient Left in bed;with call bell/phone within reach;with bed alarm set   Nurse Communication Other (comment) (patient did not swallow 1 pill)        Time: 1660-6004 OT Time Calculation (min): 21 min  Charges: OT General Charges $OT Visit: 1 Visit OT Treatments $Self Care/Home Management : 8-22 mins  Delbert Phenix OT OT pager: Ethan 04/25/2021, 12:52 PM

## 2021-04-25 NOTE — Progress Notes (Signed)
[facility]                                                                              Patient Demographics  Yvette Roberts, is a 65 y.o. female, DOB - [birth year], FYB:[id number]  Admit date - 03/27/2021   Admitting Physician Domenic Polite, MD  Outpatient Primary MD for the patient is [name], [place]  Outpatient specialists:   LOS - 29  days   Medical records reviewed and are as summarized below:    No chief complaint on file.      Brief summary   65 year old African-American female with past medical history of metastatic uterine cancer, diabetes mellitus type 2, hypertension, malignant pleural effusion who was admitted with odynophagia and weight loss.  She reported 50 pound weight loss in past 3 months.  Also underwent further work-up had a EGD and found to have nonsevere esophagitis in proximal and distal esophagus as well as scattered mild inflammation with erythema in the entire stomach.  She was started on Protonix and sucralfate.  Hospitalization was complicated by febrile neutropenia and Pseudomonas UTI.  Patient is s/p antibiotics treatment.  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 -Completed IVIG on 6/20-6/24 for 5 days with no improvement in the weakness -Status post LP x2, oligoclonal bands 0.,  Unremarkable -HSV type I/II antibody IgG CSF were elevated to 2.54.  Neurology and ID recommended repeat LP for PCR. -LP repeated on 6/26, patient was started on empiric acyclovir to be discontinued if PCR negative.   Acyclovir discontinued on 6/28 -Follow VDRL, cytology, IgG CSF -Folate 4.4, was placed on supplementation, B6 pending -Serum protein electrophoresis did not show any M spike, has nonspecific polyclonal increase in gamma globulin (can be seen in autoimmune disease states) -Per neurology, presentation likely due to nutritional deficiency versus potential CIDP, recommended outpatient follow-up with Dr. Narda Amber with [facility] neurology.  May need outpatient EMG/NCS -Palliative medicine consult pending.  PT OT evaluation recommended SNF   Active problems Odynophagia, esophagitis  -EGD 6/2 showed mild nonsevere esophagitis in the proximal and distal esophagus, scattered mild inflammation with erythema in the entire stomach -Biopsy showed reactive gastropathy, negative H. pylori.  No metaplasia or dysplasia or malignancy -GI recommended continue Protonix twice daily, Carafate 1 g p.o. 3 times daily -Outpatient follow-up with GI  Febrile neutropenia -Patient has completed 7-day course of cefepime, discontinued on 6/11 -No further fevers    Pancytopenia -No recent CBC, recheck in a.m.  Metastatic uterine cancer -Followed by oncology, currently holding off chemotherapy -Outpatient follow-up with oncology -Palliative medicine consult pending  Essential hypertension -Stable, continue Coreg  Diabetes mellitus type 2, NIDDM, appears to be diet controlled -CBGs currently stable, hemoglobin A1c 6.1 -Outpatient follow-up with PCP  Malignant pleural effusion -CT scan showed no PE, loculated right  pleural effusion is noted, not appreciably changed since the prior PET scan  -Currently no hypoxia or respiratory distress, outpatient follow-up with oncology  Pressure injury Posterior sacrum, medial lower stage II -Wound care per nursing  Severe protein calorie malnutrition secondary to poor p.o. intake and malignancy -Continue nutritional supplements Estimated body mass index is 26.87 kg/m  as calculated from the following:   Height as of this encounter: 5\' 6"  (1.676 m).   Weight as of this encounter: 75.5 kg.  Code Status: Full CODE STATUS DVT Prophylaxis:  Place and maintain sequential compression device Start: 04/15/21 0822   Level of Care: Level of care: Med-Surg Family Communication: Discussed all imaging results, lab results, explained to the patient   Disposition Plan:     Status is: Inpatient  Remains inpatient appropriate because:Inpatient level of care appropriate due to severity of illness  Dispo: The patient is from: Home              Anticipated d/c is to: SNF              Patient currently is not medically stable to d/c.   Difficult to place patient No      Time Spent in minutes   35 mins    Procedures:  LP x2  Consultants:   Neurology Infectious disease   Antimicrobials:   Anti-infectives (From admission, onward)    Start     Dose/Rate Route Frequency Ordered Stop   04/22/21 1200  acyclovir (ZOVIRAX) 750 mg in dextrose 5 % 150 mL IVPB  Status:  Discontinued        750 mg 165 mL/hr over 60 Minutes Intravenous Every 8 hours 04/22/21 1030 04/24/21 1040   04/01/21 0930  ceFEPIme (MAXIPIME) 2 g in sodium chloride 0.9 % 100 mL IVPB  Status:  Discontinued        2 g 200 mL/hr over 30 Minutes Intravenous Every 8 hours 04/01/21 0839 04/07/21 1345   03/27/21 1800  fluconazole (DIFLUCAN) IVPB 200 mg  Status:  Discontinued        200 mg 100 mL/hr over 60 Minutes Intravenous Daily 03/27/21 1734 03/29/21 1633          Medications  Scheduled Meds:  carvedilol  12.5 mg Oral BID WC   chlorhexidine  15 mL Mouth/Throat QID   Chlorhexidine Gluconate Cloth  6 each Topical Daily   feeding supplement  237 mL Oral TID BM   FLUoxetine  20 mg Oral Daily   folic acid  1 mg Oral Daily   gabapentin  200 mg Oral BID   Gerhardt's butt cream   Topical BID   magnesium oxide  800 mg Oral Daily   mouth rinse  15 mL Mouth Rinse q12n4p   melatonin  10 mg  Oral QHS   mirtazapine  15 mg Oral QHS   multivitamin with minerals  1 tablet Oral Daily   nutrition supplement (JUVEN)  1 packet Oral BID WC   pantoprazole  40 mg Oral BID   polyethylene glycol  17 g Oral Daily   potassium chloride  40 mEq Oral Daily   protein supplement  1 Scoop Oral TID WC   sucralfate  1 g Oral TID WC & HS   thiamine  100 mg Oral Daily   Continuous Infusions: PRN Meds:.acetaminophen **OR** acetaminophen, lip balm, naLOXone (NARCAN)  injection, ondansetron **OR** ondansetron (ZOFRAN) IV, oxyCODONE, phenol, sodium chloride flush      Subjective:   Anaissa Macfadden was seen  and examined today.  Per patient no significant improvement of the lower extremity weakness.  Denies any dizziness chest pain, shortness of breath, abdominal pain, nausea or vomiting.  Objective:   Vitals:   04/24/21 0513 04/24/21 1601 04/24/21 2101 04/25/21 0453  BP: (!) 143/90 (!) 150/93 (!) 135/97 (!) 159/96  Pulse: (!) 106 100 95 92  Resp: 16 17 16 16   Temp: 98.9 F (37.2 C) 98.6 F (37 C) (!) 97.5 F (36.4 C) 98.7 F (37.1 C)  TempSrc: Oral Oral Oral Oral  SpO2: 98% 94% 99% 96%  Weight:      Height:        Intake/Output Summary (Last 24 hours) at 04/25/2021 1555 Last data filed at 04/25/2021 0454 Gross per 24 hour  Intake --  Output 1200 ml  Net -1200 ml     Wt Readings from Last 3 Encounters:  04/16/21 75.5 kg  03/22/21 76.9 kg  03/01/21 84.4 kg     Exam General: Alert and oriented x self, appears to be somewhat confused Cardiovascular: S1 S2 auscultated, no murmurs, RRR Respiratory: Diminished breath sound at the bases Gastrointestinal: Soft, nontender, nondistended, + bowel sounds Ext: no pedal edema bilaterally Neuro: motor strength 1/5 in lower extremities  Psych: Normal affect and demeanor, alert and oriented x3    Data Reviewed:  I have personally reviewed following labs and imaging studies  Micro Results Recent Results (from the past 240 hour(s))  CSF  culture w Gram Stain     Status: None   Collection Time: 04/16/21  9:30 AM   Specimen: PATH Cytology CSF; Cerebrospinal Fluid  Result Value Ref Range Status   Specimen Description   Final    CSF Performed at [facility], [place] [address] [place], [place] [address]    Special Requests   Final    NONE Performed at [facility], [place] [address] [place], [place] [address]    Gram Stain   Final    NO ORGANISMS SEEN Gram Stain Report Called to,Read Back By and Verified With: MELISSA RN AT 5573 ON 04/16/21 BY S.VANHOORNE Performed at [facility], [place] [address] [place], [place] [address]    Culture   Final    NO GROWTH 3 DAYS Performed at [facility], [place] [address] [place], [place] [address]    Report Status 04/19/2021 FINAL  Final    Radiology Reports DG Chest 2 View  Result Date: 03/27/2021 CLINICAL DATA:  65 year old female with hypertension and diabetes. Stage IV uterine cancer. EXAM: CHEST - 2 VIEW COMPARISON:  Chest radiograph dated 02/25/2021. FINDINGS: Right-sided Port-A-Cath similar position. Small right pleural effusion similar to prior radiograph. Interval decrease in the size of the opacity seen in the right mid lung field. The left lung is clear. No pneumothorax. Stable cardiomediastinal silhouette. Degenerative changes of the spine. No acute osseous pathology. IMPRESSION: Decrease in the size of opacity in the right mid lung field compared to prior radiograph. Unchanged small right pleural effusion. Electronically Signed   By: Anner Crete M.D.   On: 03/27/2021 19:23   MR BRAIN W WO CONTRAST  Result Date: 04/13/2021 CLINICAL DATA:  bilateral lower extremity weakness. EXAM: MRI HEAD WITHOUT AND WITH CONTRAST TECHNIQUE: Multiplanar, multiecho pulse sequences of the brain and surrounding structures were obtained without and with intravenous contrast. CONTRAST:  7.80mL GADAVIST GADOBUTROL 1 MMOL/ML IV SOLN  COMPARISON:  None. FINDINGS: Brain: No acute infarct, mass effect or extra-axial collection. No acute  or chronic hemorrhage. There is multifocal hyperintense T2-weighted signal within the white matter. Generalized volume loss without a clear lobar predilection. Hyperintense T2-weighted signal within the brainstem is likely due to chronic small vessel disease. The midline structures are normal. There is no abnormal contrast enhancement. Vascular: Major flow voids are preserved. Skull and upper cervical spine: Normal calvarium and skull base. Visualized upper cervical spine and soft tissues are normal. Sinuses/Orbits:No paranasal sinus fluid levels or advanced mucosal thickening. No mastoid or middle ear effusion. Normal orbits. IMPRESSION: 1. No acute intracranial abnormality. 2. Chronic small vessel disease and generalized volume loss. Electronically Signed   By: Ulyses Jarred M.D.   On: 04/13/2021 23:43   MR CERVICAL SPINE W WO CONTRAST  Result Date: 04/13/2021 CLINICAL DATA:  Bilateral lower extremity weakness EXAM: MRI CERVICAL AND THORACIC SPINE WITHOUT AND WITH CONTRAST TECHNIQUE: Multiplanar and multiecho pulse sequences of the cervical spine, to include the craniocervical junction and cervicothoracic junction, and the thoracic spine, were obtained without and with intravenous contrast. CONTRAST:  7.32mL GADAVIST GADOBUTROL 1 MMOL/ML IV SOLN COMPARISON:  None. FINDINGS: MRI CERVICAL SPINE FINDINGS Alignment: Physiologic. Vertebrae: No fracture, evidence of discitis, or bone lesion. Cord: Normal signal and morphology. No abnormal contrast enhancement. Posterior Fossa, vertebral arteries, paraspinal tissues: Negative. Disc levels: C1-2: Unremarkable. C2-3: Normal disc space and facet joints. There is no spinal canal stenosis. No neural foraminal stenosis. C3-4: Small right asymmetric disc bulge with uncovertebral hypertrophy. There is no spinal canal stenosis. Mild right neural foraminal stenosis. C4-5: Small  disc bulge. There is no spinal canal stenosis. No neural foraminal stenosis. C5-6: Normal disc space and facet joints. There is no spinal canal stenosis. No neural foraminal stenosis. C6-7: Normal disc space and facet joints. There is no spinal canal stenosis. No neural foraminal stenosis. C7-T1: Normal disc space and facet joints. There is no spinal canal stenosis. No neural foraminal stenosis. MRI THORACIC SPINE FINDINGS Alignment:  Physiologic. Vertebrae: No fracture, evidence of discitis, or bone lesion. Cord: Normal signal and morphology. No abnormal contrast enhancement. Paraspinal and other soft tissues: Loculated fluid collection in the posterior right mid chest. Disc levels: T4-5: Small central disc protrusion without stenosis. T5-6: Small central disc protrusion without stenosis. T6-7: Small central disc protrusion effacing the ventral thecal sac. No spinal canal stenosis. T7-8: Minimal disc bulge without stenosis. T10-11: Small right subarticular disc protrusion with mild right foraminal narrowing. No spinal canal stenosis. The other thoracic levels are unremarkable. IMPRESSION: 1. No acute abnormality of the cervical or thoracic spine. 2. Mild cervical degenerative disc disease without spinal canal stenosis. 3. Mild right C3-4 and right T10-11 neural foraminal stenosis. 4. Loculated fluid collection in the posterior right mid chest, as previously seen on PET-CT. Electronically Signed   By: Ulyses Jarred M.D.   On: 04/13/2021 23:54   MR THORACIC SPINE W WO CONTRAST  Result Date: 04/13/2021 CLINICAL DATA:  Bilateral lower extremity weakness EXAM: MRI CERVICAL AND THORACIC SPINE WITHOUT AND WITH CONTRAST TECHNIQUE: Multiplanar and multiecho pulse sequences of the cervical spine, to include the craniocervical junction and cervicothoracic junction, and the thoracic spine, were obtained without and with intravenous contrast. CONTRAST:  7.8mL GADAVIST GADOBUTROL 1 MMOL/ML IV SOLN COMPARISON:  None.  FINDINGS: MRI CERVICAL SPINE FINDINGS Alignment: Physiologic. Vertebrae: No fracture, evidence of discitis, or bone lesion. Cord: Normal signal and morphology. No abnormal contrast enhancement. Posterior Fossa, vertebral arteries, paraspinal tissues: Negative. Disc levels: C1-2: Unremarkable. C2-3: Normal disc space and facet joints. There is no spinal  canal stenosis. No neural foraminal stenosis. C3-4: Small right asymmetric disc bulge with uncovertebral hypertrophy. There is no spinal canal stenosis. Mild right neural foraminal stenosis. C4-5: Small disc bulge. There is no spinal canal stenosis. No neural foraminal stenosis. C5-6: Normal disc space and facet joints. There is no spinal canal stenosis. No neural foraminal stenosis. C6-7: Normal disc space and facet joints. There is no spinal canal stenosis. No neural foraminal stenosis. C7-T1: Normal disc space and facet joints. There is no spinal canal stenosis. No neural foraminal stenosis. MRI THORACIC SPINE FINDINGS Alignment:  Physiologic. Vertebrae: No fracture, evidence of discitis, or bone lesion. Cord: Normal signal and morphology. No abnormal contrast enhancement. Paraspinal and other soft tissues: Loculated fluid collection in the posterior right mid chest. Disc levels: T4-5: Small central disc protrusion without stenosis. T5-6: Small central disc protrusion without stenosis. T6-7: Small central disc protrusion effacing the ventral thecal sac. No spinal canal stenosis. T7-8: Minimal disc bulge without stenosis. T10-11: Small right subarticular disc protrusion with mild right foraminal narrowing. No spinal canal stenosis. The other thoracic levels are unremarkable. IMPRESSION: 1. No acute abnormality of the cervical or thoracic spine. 2. Mild cervical degenerative disc disease without spinal canal stenosis. 3. Mild right C3-4 and right T10-11 neural foraminal stenosis. 4. Loculated fluid collection in the posterior right mid chest, as previously seen on  PET-CT. Electronically Signed   By: Ulyses Jarred M.D.   On: 04/13/2021 23:54   MR Lumbar Spine W Wo Contrast  Result Date: 04/11/2021 CLINICAL DATA:  Metastatic uterine cancer. Unable to move the lower extremities. EXAM: MRI LUMBAR SPINE WITHOUT AND WITH CONTRAST TECHNIQUE: Multiplanar and multiecho pulse sequences of the lumbar spine were obtained without and with intravenous contrast. CONTRAST:  36mL GADAVIST GADOBUTROL 1 MMOL/ML IV SOLN COMPARISON:  None. FINDINGS: Segmentation:  5 lumbar type vertebral bodies assumed. Alignment:  No malalignment. Vertebrae: No evidence of regional metastatic disease. A small focus of edema and enhancement at the inferior endplate of U54 is favored to be discogenic. Conus medullaris and cauda equina: Conus extends to the L1 level. Conus and cauda equina appear normal. Paraspinal and other soft tissues: Enlarged uterus. Disc levels: Minimal non-compressive disc bulges from T11-12 through L1-2. L2-3: Endplate osteophytes and moderate bulging of the disc. Mild facet and ligamentous hypertrophy. Moderate canal stenosis, but without definite focal neural compression. Foraminal stenosis on the left primarily due to encroachment by facet arthropathy could affect the exiting left L2 nerve. L3-4: Endplate osteophytes and bulging of the disc. Facet and ligamentous hypertrophy. Moderate canal stenosis. Bilateral lateral recess stenosis could cause neural compression on either or both sides. Foraminal narrowing worse on the right could possibly affect the exiting right L3 nerve. L4-5: Endplate osteophytes and bulging of the disc. Facet and ligamentous hypertrophy. Mild stenosis of both lateral recesses but without definite neural compression. L5-S1: Mild bulging of the disc. No compressive canal or foraminal stenosis. IMPRESSION: No evidence of regional metastatic disease. Small focus of edema and enhancement at the inferior endplate of Y70 favored to be discogenic in nature. Even if  this did represent a small bone metastasis, it would not relate to the clinical presentation. Multilevel degenerative disc disease and degenerative facet disease throughout the lumbar region as above. Some potential for neural compression as outlined at each level above, but I would not expect these abnormalities to present with an inability to move the lower extremities. Electronically Signed   By: Nelson Chimes M.D.   On: 04/11/2021 16:41  CT CHEST ABDOMEN PELVIS W CONTRAST  Result Date: 03/28/2021 CLINICAL DATA:  Ovarian cancer assess treatment response. EXAM: CT CHEST, ABDOMEN, AND PELVIS WITH CONTRAST TECHNIQUE: Multidetector CT imaging of the chest, abdomen and pelvis was performed following the standard protocol during bolus administration of intravenous contrast. CONTRAST:  143mL OMNIPAQUE IOHEXOL 300 MG/ML  SOLN COMPARISON:  PET-CT from December 08, 2020 and CT of the chest of Feb 25, 2021. FINDINGS: CT CHEST FINDINGS Cardiovascular: RIGHT-sided Port-A-Cath terminates in the lower RIGHT atrium similar to recent CT imaging, perhaps influenced by arm position as on the chest x-ray the tip is at the caval to atrial junction. Heart size is normal without substantial pericardial effusion. Normal caliber of the thoracic aorta. Normal caliber of the central pulmonary vasculature. Mediastinum/Nodes: No thoracic inlet adenopathy. No axillary adenopathy. No mediastinal lymphadenopathy. Esophagus mildly patulous. Lungs/Pleura: Loculated RIGHT-sided pleural fluid with 6.5 x 3.6 cm axial dimension as compared to 8.1 x 3.9 cm. Basilar volume loss and signs of pleural thickening with decreased pleural thickening, markedly diminished since the prior PET and with continued decreased pleural thickening particularly in the inferior RIGHT chest even since the recent CT of the chest. Over the RIGHT hemidiaphragm pleural thickness approximately 7 mm as compared to 12 mm greatest thickness. LEFT chest is clear. Airways are  patent. Musculoskeletal: See below for full musculoskeletal details. CT ABDOMEN PELVIS FINDINGS Hepatobiliary: Low-density lesions along the cephalad margin of the RIGHT hemi liver and across the top of the RIGHT hemidiaphragm and diminished in size considerably compared to the PET exam of February of 2022. Pleural thickening across the surface of the liver also with decreased thickness (image 47/2) 7 mm greatest thickness as compared to 14 mm greatest thickness. Area of low attenuation along the capsule of the liver (image 45/2) overlying hepatic subsegment Roman numeral 8 measuring 18 mm as compared to 20 mm on the most recent comparison and is much as 43 mm on the prior CT evaluation. Capsular implant with extension into hepatic parenchyma much less conspicuous today than on the previous PET-CT from February and the CT from Feb 25, 2021. This area measuring approximately 1.8 as compared to 2.4 cm based on comparison with the most recent study. Much smaller than on the study of February of 2022 where it measured approximately 41 mm. Cystic area along the gallbladder fossa and in the inferior aspect of the RIGHT hemi liver with stable appearance largest area measuring 3 cm. Portal vein is patent. Hepatic veins are patent. Cholelithiasis without pericholecystic stranding. No sign of biliary duct distension. Pancreas: Normal, without mass, inflammation or ductal dilatation. Spleen: Spleen normal size and contour. Adrenals/Urinary Tract: Adrenal glands are normal. Symmetric renal enhancement. No hydronephrosis. Smooth contour the urinary bladder. Stomach/Bowel: No acute gastrointestinal process soft tissue adjacent to the appendix in the RIGHT lower quadrant without signs of adjacent stranding is an unchanged caliber of the appendix dating back to February 2021 in the setting of known peritoneal disease with diminished fluid in the pelvis and omental and serosal nodularity throughout the abdomen. Vascular/Lymphatic:  Normal caliber abdominal aorta and IVC. There is no gastrohepatic or hepatoduodenal ligament lymphadenopathy. No retroperitoneal or mesenteric lymphadenopathy. Reproductive: Decreased bulky appearance of the uterus. RIGHT adnexal cystic and solid area measuring 4.5 x 2.5 cm as compared to 5.9 x 3.1 cm. Decreased ascites in the pelvis. Other: Decreased ascites as above. Decreased signs of omental nodularity and serosal nodularity. Liver capsular disease as described above. Discrete nodules in the LEFT omentum persist,  largest approximately 6 mm (image 88/2) previously 6-7 mm. Focal thickening of the LEFT rectus muscle is diminished no measurable lesion in this area, thickness of the LEFT rectus muscle on image 103 of series 2 is 13 mm as compared to 24 mm. The adjacent omental and peritoneal nodularity and stranding is subjectively diminished since the prior study. Musculoskeletal: Spinal degenerative changes. New sclerotic focus in the RIGHT proximal femur in the femoral neck and head junction measuring 14 mm. New sclerotic focus at the T12 level (image 60/2) 13 mm. Sclerosis at the T12 level is present however on the study of May of 2022. Not seen on the PET exam of February 2022. Spinal degenerative changes. IMPRESSION: 1. Decreased loculated pleural fluid and pleural thickening in the chest follows diminishing perihepatic soft tissue and capsular hepatic implants as described. 2. No adenopathy by size criteria in the abdomen or pelvis. Minimal soft tissue in the intra-aortocaval groove is all that remains on image 77 of series 2 in the retroperitoneum. 3. Decreased bulk of the uterus subjectively and of RIGHT adnexal structures as described. 4. New areas of bony sclerosis compatible with skeletal metastasis. No signs of increased FDG uptake and no visible sclerosis on the previous PET. In the context of improvement of other disease it is possible that this represents treated metastasis. Would suggest attention on  follow-up. 5. Loculated RIGHT-sided pleural fluid with signs of pleural thickening in the lower RIGHT chest otherwise with similar appearance. 6. Cholelithiasis without evidence of acute cholecystitis. 7. Aortic atherosclerosis. Electronically Signed   By: Zetta Bills M.D.   On: 03/28/2021 15:52   DG CHEST PORT 1 VIEW  Result Date: 04/01/2021 CLINICAL DATA:  Neutropenic fever EXAM: PORTABLE CHEST 1 VIEW COMPARISON:  March 31, 2021 FINDINGS: Stable small right-sided loculated pleural effusion. Hazy opacification of the right hemithorax with fluid in the minor fissure likely representing a small free-flowing pleural effusion. Atelectasis in the right lung base. Left lung is clear. Right chest wall Port-A-Cath with tip overlying the right atrium. The heart size and mediastinal contours are unchanged. Thoracic spondylosis with degenerative changes shoulders. IMPRESSION: Stable right-sided pleural fluid with adjacent atelectasis. Left lung is clear. Electronically Signed   By: Dahlia Bailiff MD   On: 04/01/2021 11:37   DG CHEST PORT 1 VIEW  Result Date: 03/31/2021 CLINICAL DATA:  Shortness of breath.  History of ovarian carcinoma EXAM: PORTABLE CHEST 1 VIEW COMPARISON:  Chest radiograph Mar 27, 2021; chest CT March 28, 2021 FINDINGS: Apparent loculated pleural effusion on the right remains. There is also an apparent free-flowing small right pleural effusion, not appreciably changed from recent CT. Atelectasis right base. Left lung clear. Heart is upper normal in size with pulmonary vascularity normal. Port-A-Cath tip is in the right atrium slightly beyond the superior vena cava-right atrium junction. No adenopathy appreciable. Degenerative change in each shoulder noted. No pneumothorax. IMPRESSION: Pleural effusion on the right, primarily loculated, essentially stable compared to recent CT examination. Right base atelectasis noted. Left lung clear. No new opacity evident. Stable cardiac silhouette. Port-A-Cath  present on the right. Electronically Signed   By: Lowella Grip III M.D.   On: 03/31/2021 08:12   DG FL GUIDED LUMBAR PUNCTURE  Result Date: 04/21/2021 CLINICAL DATA:  Weakness. EXAM: DIAGNOSTIC LUMBAR PUNCTURE UNDER FLUOROSCOPIC GUIDANCE COMPARISON:  Fluoroscopically guided lumbar puncture 04/16/2021. Lumbar spine MRI 04/11/2021. FLUOROSCOPY TIME:  Fluoroscopy Time:  12 seconds Radiation Exposure Index (if provided by the fluoroscopic device): 0.7 mGy Number of Acquired Spot  Images: 0 PROCEDURE: Informed consent was obtained from the patient's son prior to the procedure, including potential complications of headache, allergy, and pain. With the patient prone, the lower back was prepped with Betadine. 1% Lidocaine was used for local anesthesia. Lumbar puncture was performed at the L4-5 level using a 3.5 inch 20 gauge needle via a right interlaminar approach with return of clear CSF. 6.5 mL of CSF were obtained for laboratory studies. The patient tolerated the procedure well and there were no apparent complications. IMPRESSION: Technically successful fluoroscopically guided lumbar puncture. Electronically Signed   By: Logan Bores M.D.   On: 04/21/2021 14:41   DG FLUORO GUIDE LUMBAR PUNCTURE  Result Date: 04/16/2021 CLINICAL DATA:  Weakness. EXAM: DIAGNOSTIC LUMBAR PUNCTURE UNDER FLUOROSCOPIC GUIDANCE COMPARISON:  Lumbar spine MRI 04/11/2021. FLUOROSCOPY TIME:  Fluoroscopy Time:  24 seconds Radiation Exposure Index (if provided by the fluoroscopic device): 3 mGy Number of Acquired Spot Images: None PROCEDURE: Informed consent was obtained from the patient prior to the procedure, including a discussion of potential complications including but not limited to headache, allergy, pain, bleeding, infection, spinal cord/nerve root damage. With the patient prone, the lower back was prepped with Betadine. 1% Lidocaine was used for local anesthesia. Lumbar puncture was performed at the L3-L4 level using a 20 gauge  needle with return of clear CSF. 16.5 ml of CSF were obtained for laboratory studies. The patient tolerated the procedure well without immediate postprocedure complication. IMPRESSION: Technically successful fluoroscopically guided L3-L4 lumbar puncture. 16.5 mL of CSF obtained for laboratory studies. No immediate postprocedure complication. Electronically Signed   By: Kellie Simmering DO   On: 04/16/2021 09:33    Lab Data:  CBC: No results for input(s): WBC, NEUTROABS, HGB, HCT, MCV, PLT in the last 168 hours. Basic Metabolic Panel: Recent Labs  Lab 04/19/21 0450 04/20/21 0511 04/21/21 0500 04/22/21 0500 04/23/21 0452  NA 133*  --   --  135 138  K 3.8  --   --  3.6 3.7  CL 99  --   --  98 102  CO2 29  --   --  30 29  GLUCOSE 110*  --   --  114* 146*  BUN 10  --   --  14 14  CREATININE 0.41*  --   --  0.42* 0.59  CALCIUM 9.1  --   --  9.4 9.8  MG 1.7 1.6* 1.5* 1.9  --   PHOS  --   --   --  3.5  --    GFR: Estimated Creatinine Clearance: 72.8 mL/min (by C-G formula based on SCr of 0.59 mg/dL). Liver Function Tests: Recent Labs  Lab 04/22/21 0500  AST 18  ALT 18  ALKPHOS 48  BILITOT 0.5  PROT 8.8*  ALBUMIN 2.5*   No results for input(s): LIPASE, AMYLASE in the last 168 hours. No results for input(s): AMMONIA in the last 168 hours. Coagulation Profile: No results for input(s): INR, PROTIME in the last 168 hours. Cardiac Enzymes: No results for input(s): CKTOTAL, CKMB, CKMBINDEX, TROPONINI in the last 168 hours. BNP (last 3 results) No results for input(s): PROBNP in the last 8760 hours. HbA1C: No results for input(s): HGBA1C in the last 72 hours. CBG: Recent Labs  Lab 04/21/21 1140 04/21/21 1643 04/22/21 0709 04/22/21 1145 04/22/21 1623  GLUCAP 117* 127* 95 119* 151*   Lipid Profile: No results for input(s): CHOL, HDL, LDLCALC, TRIG, CHOLHDL, LDLDIRECT in the last 72 hours. Thyroid Function Tests: No results  for input(s): TSH, T4TOTAL, FREET4, T3FREE, THYROIDAB  in the last 72 hours. Anemia Panel: No results for input(s): VITAMINB12, FOLATE, FERRITIN, TIBC, IRON, RETICCTPCT in the last 72 hours. Urine analysis:    Component Value Date/Time   COLORURINE AMBER (A) 04/01/2021 0856   APPEARANCEUR CLEAR 04/01/2021 0856   LABSPEC 1.016 04/01/2021 0856   PHURINE 6.0 04/01/2021 0856   GLUCOSEU NEGATIVE 04/01/2021 0856   HGBUR NEGATIVE 04/01/2021 0856   BILIRUBINUR NEGATIVE 04/01/2021 0856   BILIRUBINUR n 03/06/2015 1138   KETONESUR 5 (A) 04/01/2021 0856   PROTEINUR 30 (A) 04/01/2021 0856   UROBILINOGEN 1.0 03/06/2015 1138   NITRITE NEGATIVE 04/01/2021 0856   LEUKOCYTESUR NEGATIVE 04/01/2021 0856     Palma Buster M.D. [facility] 04/25/2021, 3:55 PM  Available via Epic secure chat 7am-7pm After 7 pm, please refer to night coverage provider listed on amion.

## 2021-04-26 DIAGNOSIS — R1010 Upper abdominal pain, unspecified: Secondary | ICD-10-CM

## 2021-04-26 LAB — COMPREHENSIVE METABOLIC PANEL
ALT: 15 U/L (ref 0–44)
AST: 19 U/L (ref 15–41)
Albumin: 2.6 g/dL — ABNORMAL LOW (ref 3.5–5.0)
Alkaline Phosphatase: 41 U/L (ref 38–126)
Anion gap: 6 (ref 5–15)
BUN: 22 mg/dL (ref 8–23)
CO2: 31 mmol/L (ref 22–32)
Calcium: 10 mg/dL (ref 8.9–10.3)
Chloride: 102 mmol/L (ref 98–111)
Creatinine, Ser: 0.35 mg/dL — ABNORMAL LOW (ref 0.44–1.00)
GFR, Estimated: >60 mL/min (ref >60)
Glucose, Bld: 120 mg/dL — ABNORMAL HIGH (ref 70–99)
Potassium: 3.3 mmol/L — ABNORMAL LOW (ref 3.5–5.1)
Sodium: 139 mmol/L (ref 135–145)
Total Bilirubin: 0.5 mg/dL (ref 0.3–1.2)
Total Protein: 7.6 g/dL (ref 6.5–8.1)

## 2021-04-26 LAB — CBC
HCT: 27.5 % — ABNORMAL LOW (ref 36.0–46.0)
Hemoglobin: 8.7 g/dL — ABNORMAL LOW (ref 12.0–15.0)
MCH: 29.2 pg (ref 26.0–34.0)
MCHC: 31.6 g/dL (ref 30.0–36.0)
MCV: 92.3 fL (ref 80.0–100.0)
Platelets: 184 10*3/uL (ref 150–400)
RBC: 2.98 MIL/uL — ABNORMAL LOW (ref 3.87–5.11)
RDW: 18.3 % — ABNORMAL HIGH (ref 11.5–15.5)
WBC: 5.1 10*3/uL (ref 4.0–10.5)
nRBC: 0 % (ref 0.0–0.2)

## 2021-04-26 NOTE — Progress Notes (Signed)
[facility]                                                                              Patient Demographics  Yvette Roberts, is a 65 y.o. female, DOB - 05-Mar-1956, KGY:[id number]  Admit date - 03/27/2021   Admitting Physician Domenic Polite, MD  Outpatient Primary MD for the patient is [name], [place]  Outpatient specialists:   LOS - 30  days   Medical records reviewed and are as summarized below:    No chief complaint on file.      Brief summary   65 year old African-American female with past medical history of metastatic uterine cancer, diabetes mellitus type 2, hypertension, malignant pleural effusion who was admitted with odynophagia and weight loss.  She reported 50 pound weight loss in past 3 months.  Also underwent further work-up had a EGD and found to have nonsevere esophagitis in proximal and distal esophagus as well as scattered mild inflammation with erythema in the entire stomach.  She was started on Protonix and sucralfate.  Hospitalization was complicated by febrile neutropenia and Pseudomonas UTI.  Patient is s/p antibiotics treatment.  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 -Completed IVIG on 6/20-6/24 for 5 days with no improvement in the weakness -Status post LP x2, oligoclonal bands 0.,  Unremarkable -HSV type I/II antibody IgG CSF were elevated to 2.54.  Neurology and ID recommended repeat LP for PCR. -LP repeated on 6/26, patient was started on empiric acyclovir to be discontinued if PCR negative.   Acyclovir discontinued on 6/28 -Follow VDRL, cytology, IgG CSF -Folate 4.4, was placed on supplementation, B6 pending -Serum protein electrophoresis did not show any M spike, has nonspecific polyclonal increase in gamma globulin (can be seen in autoimmune disease states) -Per neurology, presentation likely due to nutritional deficiency versus potential CIDP, recommended outpatient follow-up with Dr. Narda Amber with [facility] neurology.  May need outpatient EMG/NCS -Palliative medicine following for GOC   Active problems Odynophagia, esophagitis  -EGD 6/2 showed mild nonsevere esophagitis in the proximal and distal esophagus, scattered mild inflammation with erythema in the entire stomach -Biopsy showed reactive gastropathy, negative H. pylori.  No metaplasia or dysplasia or malignancy -GI recommended continue Protonix twice daily, Carafate 1 g p.o. 3 times daily -Outpatient follow-up with GI  Febrile neutropenia -Patient has completed 7-day course of cefepime, discontinued on 6/11 -Afebrile  Hypokalemia -K3.3, on daily potassium replacement    Pancytopenia -H&H stable  Metastatic uterine cancer -Followed by oncology, currently holding off chemotherapy -Outpatient follow-up with oncology -Palliative medicine consult pending  Essential hypertension -Stable, continue Coreg  Diabetes mellitus type 2, NIDDM, appears to be diet controlled -CBGs currently stable, hemoglobin A1c 6.1 -Outpatient follow-up with PCP  Malignant pleural effusion -CT scan showed no PE, loculated right pleural effusion is noted,  not appreciably changed since the prior PET scan  -Currently no hypoxia or respiratory distress, outpatient follow-up with oncology  Pressure injury Posterior sacrum, medial lower stage II -Wound care per nursing  Severe protein calorie malnutrition secondary to poor p.o. intake and malignancy -Continue nutritional supplements Estimated body mass index is 26.87 kg/m as  calculated from the following:   Height as of this encounter: 5\' 6"  (1.676 m).   Weight as of this encounter: 75.5 kg.  Code Status: Full CODE STATUS DVT Prophylaxis:  Place and maintain sequential compression device Start: 04/15/21 0822   Level of Care: Level of care: Med-Surg Family Communication: Discussed all imaging results, lab results, explained to the patient.  Awaiting family for [place]  Disposition Plan:     Status is: Inpatient  Remains inpatient appropriate because:Inpatient level of care appropriate due to severity of illness  Dispo: The patient is from: Home              Anticipated d/c is to: SNF              Patient currently is not medically stable to d/c.   Difficult to place patient No      Time Spent in minutes   25 minutes  Procedures:  LP x2  Consultants:   Neurology Infectious disease Palliative medicine  Antimicrobials:   Anti-infectives (From admission, onward)    Start     Dose/Rate Route Frequency Ordered Stop   04/22/21 1200  acyclovir (ZOVIRAX) 750 mg in dextrose 5 % 150 mL IVPB  Status:  Discontinued        750 mg 165 mL/hr over 60 Minutes Intravenous Every 8 hours 04/22/21 1030 04/24/21 1040   04/01/21 0930  ceFEPIme (MAXIPIME) 2 g in sodium chloride 0.9 % 100 mL IVPB  Status:  Discontinued        2 g 200 mL/hr over 30 Minutes Intravenous Every 8 hours 04/01/21 0839 04/07/21 1345   03/27/21 1800  fluconazole (DIFLUCAN) IVPB 200 mg  Status:  Discontinued        200 mg 100 mL/hr over 60 Minutes Intravenous Daily 03/27/21 1734 03/29/21 1633          Medications  Scheduled Meds:  carvedilol  12.5 mg Oral BID WC   chlorhexidine  15 mL Mouth/Throat QID   Chlorhexidine Gluconate Cloth  6 each Topical Daily   feeding supplement  237 mL Oral TID BM   FLUoxetine  20 mg Oral Daily   folic acid  1 mg Oral Daily   gabapentin  200 mg Oral BID   Gerhardt's butt cream   Topical BID   magnesium oxide  800 mg Oral Daily   mouth rinse  15 mL  Mouth Rinse q12n4p   melatonin  10 mg Oral QHS   mirtazapine  15 mg Oral QHS   multivitamin with minerals  1 tablet Oral Daily   nutrition supplement (JUVEN)  1 packet Oral BID WC   pantoprazole  40 mg Oral BID   polyethylene glycol  17 g Oral Daily   potassium chloride  40 mEq Oral Daily   protein supplement  1 Scoop Oral TID WC   sucralfate  1 g Oral TID WC & HS   thiamine  100 mg Oral Daily   Continuous Infusions: PRN Meds:.acetaminophen **OR** acetaminophen, lip balm, naLOXone (NARCAN)  injection, ondansetron **OR** ondansetron (ZOFRAN) IV, oxyCODONE, phenol, sodium chloride flush      Subjective:   Yvette Roberts was seen and  examined today.  Somewhat confused, oriented to herself.  Pleasant.  No acute events overnight.  No fevers or chills, nausea or vomiting.  No significant improvement of the lower extremity weakness.    Objective:   Vitals:   04/25/21 1621 04/25/21 2041 04/26/21 0448 04/26/21 1354  BP: 127/87 (!) 110/94 (!) 143/87 131/90  Pulse: 93 96 90 90  Resp: 17 16 16 16   Temp: 99 F (37.2 C) 98.3 F (36.8 C) 98.5 F (36.9 C) 98.3 F (36.8 C)  TempSrc: Oral Oral Oral Oral  SpO2: 99% 94% 98% 100%  Weight:      Height:        Intake/Output Summary (Last 24 hours) at 04/26/2021 1540 Last data filed at 04/26/2021 1400 Gross per 24 hour  Intake 300 ml  Output 1650 ml  Net -1350 ml     Wt Readings from Last 3 Encounters:  04/16/21 75.5 kg  03/22/21 76.9 kg  03/01/21 84.4 kg   Physical Exam General: Alert and oriented x self, NAD Cardiovascular: S1 S2 clear, RRR. No pedal edema b/l Respiratory: CTAB, no wheezing, rales or rhonchi Gastrointestinal: Soft, nontender, nondistended, NBS Ext: no pedal edema bilaterally Neuro: motor strength 1/5 in lower extremities Musculoskeletal: No cyanosis, clubbing Skin: No rashes Psych: oriented to self, otherwise confused   Data Reviewed:  I have personally reviewed following labs and imaging studies  Micro  Results No results found for this or any previous visit (from the past 240 hour(s)).   Radiology Reports DG Chest 2 View  Result Date: 03/27/2021 CLINICAL DATA:  65 year old female with hypertension and diabetes. Stage IV uterine cancer. EXAM: CHEST - 2 VIEW COMPARISON:  Chest radiograph dated 02/25/2021. FINDINGS: Right-sided Port-A-Cath similar position. Small right pleural effusion similar to prior radiograph. Interval decrease in the size of the opacity seen in the right mid lung field. The left lung is clear. No pneumothorax. Stable cardiomediastinal silhouette. Degenerative changes of the spine. No acute osseous pathology. IMPRESSION: Decrease in the size of opacity in the right mid lung field compared to prior radiograph. Unchanged small right pleural effusion. Electronically Signed   By: Anner Crete M.D.   On: 03/27/2021 19:23   MR BRAIN W WO CONTRAST  Result Date: 04/13/2021 CLINICAL DATA:  bilateral lower extremity weakness. EXAM: MRI HEAD WITHOUT AND WITH CONTRAST TECHNIQUE: Multiplanar, multiecho pulse sequences of the brain and surrounding structures were obtained without and with intravenous contrast. CONTRAST:  7.11mL GADAVIST GADOBUTROL 1 MMOL/ML IV SOLN COMPARISON:  None. FINDINGS: Brain: No acute infarct, mass effect or extra-axial collection. No acute or chronic hemorrhage. There is multifocal hyperintense T2-weighted signal within the white matter. Generalized volume loss without a clear lobar predilection. Hyperintense T2-weighted signal within the brainstem is likely due to chronic small vessel disease. The midline structures are normal. There is no abnormal contrast enhancement. Vascular: Major flow voids are preserved. Skull and upper cervical spine: Normal calvarium and skull base. Visualized upper cervical spine and soft tissues are normal. Sinuses/Orbits:No paranasal sinus fluid levels or advanced mucosal thickening. No mastoid or middle ear effusion. Normal orbits.  IMPRESSION: 1. No acute intracranial abnormality. 2. Chronic small vessel disease and generalized volume loss. Electronically Signed   By: Ulyses Jarred M.D.   On: 04/13/2021 23:43   MR CERVICAL SPINE W WO CONTRAST  Result Date: 04/13/2021 CLINICAL DATA:  Bilateral lower extremity weakness EXAM: MRI CERVICAL AND THORACIC SPINE WITHOUT AND WITH CONTRAST TECHNIQUE: Multiplanar and multiecho pulse sequences of the cervical spine, to include  the craniocervical junction and cervicothoracic junction, and the thoracic spine, were obtained without and with intravenous contrast. CONTRAST:  7.88mL GADAVIST GADOBUTROL 1 MMOL/ML IV SOLN COMPARISON:  None. FINDINGS: MRI CERVICAL SPINE FINDINGS Alignment: Physiologic. Vertebrae: No fracture, evidence of discitis, or bone lesion. Cord: Normal signal and morphology. No abnormal contrast enhancement. Posterior Fossa, vertebral arteries, paraspinal tissues: Negative. Disc levels: C1-2: Unremarkable. C2-3: Normal disc space and facet joints. There is no spinal canal stenosis. No neural foraminal stenosis. C3-4: Small right asymmetric disc bulge with uncovertebral hypertrophy. There is no spinal canal stenosis. Mild right neural foraminal stenosis. C4-5: Small disc bulge. There is no spinal canal stenosis. No neural foraminal stenosis. C5-6: Normal disc space and facet joints. There is no spinal canal stenosis. No neural foraminal stenosis. C6-7: Normal disc space and facet joints. There is no spinal canal stenosis. No neural foraminal stenosis. C7-T1: Normal disc space and facet joints. There is no spinal canal stenosis. No neural foraminal stenosis. MRI THORACIC SPINE FINDINGS Alignment:  Physiologic. Vertebrae: No fracture, evidence of discitis, or bone lesion. Cord: Normal signal and morphology. No abnormal contrast enhancement. Paraspinal and other soft tissues: Loculated fluid collection in the posterior right mid chest. Disc levels: T4-5: Small central disc protrusion without  stenosis. T5-6: Small central disc protrusion without stenosis. T6-7: Small central disc protrusion effacing the ventral thecal sac. No spinal canal stenosis. T7-8: Minimal disc bulge without stenosis. T10-11: Small right subarticular disc protrusion with mild right foraminal narrowing. No spinal canal stenosis. The other thoracic levels are unremarkable. IMPRESSION: 1. No acute abnormality of the cervical or thoracic spine. 2. Mild cervical degenerative disc disease without spinal canal stenosis. 3. Mild right C3-4 and right T10-11 neural foraminal stenosis. 4. Loculated fluid collection in the posterior right mid chest, as previously seen on PET-CT. Electronically Signed   By: Ulyses Jarred M.D.   On: 04/13/2021 23:54   MR THORACIC SPINE W WO CONTRAST  Result Date: 04/13/2021 CLINICAL DATA:  Bilateral lower extremity weakness EXAM: MRI CERVICAL AND THORACIC SPINE WITHOUT AND WITH CONTRAST TECHNIQUE: Multiplanar and multiecho pulse sequences of the cervical spine, to include the craniocervical junction and cervicothoracic junction, and the thoracic spine, were obtained without and with intravenous contrast. CONTRAST:  7.28mL GADAVIST GADOBUTROL 1 MMOL/ML IV SOLN COMPARISON:  None. FINDINGS: MRI CERVICAL SPINE FINDINGS Alignment: Physiologic. Vertebrae: No fracture, evidence of discitis, or bone lesion. Cord: Normal signal and morphology. No abnormal contrast enhancement. Posterior Fossa, vertebral arteries, paraspinal tissues: Negative. Disc levels: C1-2: Unremarkable. C2-3: Normal disc space and facet joints. There is no spinal canal stenosis. No neural foraminal stenosis. C3-4: Small right asymmetric disc bulge with uncovertebral hypertrophy. There is no spinal canal stenosis. Mild right neural foraminal stenosis. C4-5: Small disc bulge. There is no spinal canal stenosis. No neural foraminal stenosis. C5-6: Normal disc space and facet joints. There is no spinal canal stenosis. No neural foraminal stenosis.  C6-7: Normal disc space and facet joints. There is no spinal canal stenosis. No neural foraminal stenosis. C7-T1: Normal disc space and facet joints. There is no spinal canal stenosis. No neural foraminal stenosis. MRI THORACIC SPINE FINDINGS Alignment:  Physiologic. Vertebrae: No fracture, evidence of discitis, or bone lesion. Cord: Normal signal and morphology. No abnormal contrast enhancement. Paraspinal and other soft tissues: Loculated fluid collection in the posterior right mid chest. Disc levels: T4-5: Small central disc protrusion without stenosis. T5-6: Small central disc protrusion without stenosis. T6-7: Small central disc protrusion effacing the ventral thecal sac. No spinal  canal stenosis. T7-8: Minimal disc bulge without stenosis. T10-11: Small right subarticular disc protrusion with mild right foraminal narrowing. No spinal canal stenosis. The other thoracic levels are unremarkable. IMPRESSION: 1. No acute abnormality of the cervical or thoracic spine. 2. Mild cervical degenerative disc disease without spinal canal stenosis. 3. Mild right C3-4 and right T10-11 neural foraminal stenosis. 4. Loculated fluid collection in the posterior right mid chest, as previously seen on PET-CT. Electronically Signed   By: Ulyses Jarred M.D.   On: 04/13/2021 23:54   MR Lumbar Spine W Wo Contrast  Result Date: 04/11/2021 CLINICAL DATA:  Metastatic uterine cancer. Unable to move the lower extremities. EXAM: MRI LUMBAR SPINE WITHOUT AND WITH CONTRAST TECHNIQUE: Multiplanar and multiecho pulse sequences of the lumbar spine were obtained without and with intravenous contrast. CONTRAST:  66mL GADAVIST GADOBUTROL 1 MMOL/ML IV SOLN COMPARISON:  None. FINDINGS: Segmentation:  5 lumbar type vertebral bodies assumed. Alignment:  No malalignment. Vertebrae: No evidence of regional metastatic disease. A small focus of edema and enhancement at the inferior endplate of E70 is favored to be discogenic. Conus medullaris and cauda  equina: Conus extends to the L1 level. Conus and cauda equina appear normal. Paraspinal and other soft tissues: Enlarged uterus. Disc levels: Minimal non-compressive disc bulges from T11-12 through L1-2. L2-3: Endplate osteophytes and moderate bulging of the disc. Mild facet and ligamentous hypertrophy. Moderate canal stenosis, but without definite focal neural compression. Foraminal stenosis on the left primarily due to encroachment by facet arthropathy could affect the exiting left L2 nerve. L3-4: Endplate osteophytes and bulging of the disc. Facet and ligamentous hypertrophy. Moderate canal stenosis. Bilateral lateral recess stenosis could cause neural compression on either or both sides. Foraminal narrowing worse on the right could possibly affect the exiting right L3 nerve. L4-5: Endplate osteophytes and bulging of the disc. Facet and ligamentous hypertrophy. Mild stenosis of both lateral recesses but without definite neural compression. L5-S1: Mild bulging of the disc. No compressive canal or foraminal stenosis. IMPRESSION: No evidence of regional metastatic disease. Small focus of edema and enhancement at the inferior endplate of J50 favored to be discogenic in nature. Even if this did represent a small bone metastasis, it would not relate to the clinical presentation. Multilevel degenerative disc disease and degenerative facet disease throughout the lumbar region as above. Some potential for neural compression as outlined at each level above, but I would not expect these abnormalities to present with an inability to move the lower extremities. Electronically Signed   By: Nelson Chimes M.D.   On: 04/11/2021 16:41   CT CHEST ABDOMEN PELVIS W CONTRAST  Result Date: 03/28/2021 CLINICAL DATA:  Ovarian cancer assess treatment response. EXAM: CT CHEST, ABDOMEN, AND PELVIS WITH CONTRAST TECHNIQUE: Multidetector CT imaging of the chest, abdomen and pelvis was performed following the standard protocol during bolus  administration of intravenous contrast. CONTRAST:  18mL OMNIPAQUE IOHEXOL 300 MG/ML  SOLN COMPARISON:  PET-CT from December 08, 2020 and CT of the chest of Feb 25, 2021. FINDINGS: CT CHEST FINDINGS Cardiovascular: RIGHT-sided Port-A-Cath terminates in the lower RIGHT atrium similar to recent CT imaging, perhaps influenced by arm position as on the chest x-ray the tip is at the caval to atrial junction. Heart size is normal without substantial pericardial effusion. Normal caliber of the thoracic aorta. Normal caliber of the central pulmonary vasculature. Mediastinum/Nodes: No thoracic inlet adenopathy. No axillary adenopathy. No mediastinal lymphadenopathy. Esophagus mildly patulous. Lungs/Pleura: Loculated RIGHT-sided pleural fluid with 6.5 x 3.6 cm axial dimension  as compared to 8.1 x 3.9 cm. Basilar volume loss and signs of pleural thickening with decreased pleural thickening, markedly diminished since the prior PET and with continued decreased pleural thickening particularly in the inferior RIGHT chest even since the recent CT of the chest. Over the RIGHT hemidiaphragm pleural thickness approximately 7 mm as compared to 12 mm greatest thickness. LEFT chest is clear. Airways are patent. Musculoskeletal: See below for full musculoskeletal details. CT ABDOMEN PELVIS FINDINGS Hepatobiliary: Low-density lesions along the cephalad margin of the RIGHT hemi liver and across the top of the RIGHT hemidiaphragm and diminished in size considerably compared to the PET exam of February of 2022. Pleural thickening across the surface of the liver also with decreased thickness (image 47/2) 7 mm greatest thickness as compared to 14 mm greatest thickness. Area of low attenuation along the capsule of the liver (image 45/2) overlying hepatic subsegment Roman numeral 8 measuring 18 mm as compared to 20 mm on the most recent comparison and is much as 43 mm on the prior CT evaluation. Capsular implant with extension into hepatic  parenchyma much less conspicuous today than on the previous PET-CT from February and the CT from Feb 25, 2021. This area measuring approximately 1.8 as compared to 2.4 cm based on comparison with the most recent study. Much smaller than on the study of February of 2022 where it measured approximately 41 mm. Cystic area along the gallbladder fossa and in the inferior aspect of the RIGHT hemi liver with stable appearance largest area measuring 3 cm. Portal vein is patent. Hepatic veins are patent. Cholelithiasis without pericholecystic stranding. No sign of biliary duct distension. Pancreas: Normal, without mass, inflammation or ductal dilatation. Spleen: Spleen normal size and contour. Adrenals/Urinary Tract: Adrenal glands are normal. Symmetric renal enhancement. No hydronephrosis. Smooth contour the urinary bladder. Stomach/Bowel: No acute gastrointestinal process soft tissue adjacent to the appendix in the RIGHT lower quadrant without signs of adjacent stranding is an unchanged caliber of the appendix dating back to February 2021 in the setting of known peritoneal disease with diminished fluid in the pelvis and omental and serosal nodularity throughout the abdomen. Vascular/Lymphatic: Normal caliber abdominal aorta and IVC. There is no gastrohepatic or hepatoduodenal ligament lymphadenopathy. No retroperitoneal or mesenteric lymphadenopathy. Reproductive: Decreased bulky appearance of the uterus. RIGHT adnexal cystic and solid area measuring 4.5 x 2.5 cm as compared to 5.9 x 3.1 cm. Decreased ascites in the pelvis. Other: Decreased ascites as above. Decreased signs of omental nodularity and serosal nodularity. Liver capsular disease as described above. Discrete nodules in the LEFT omentum persist, largest approximately 6 mm (image 88/2) previously 6-7 mm. Focal thickening of the LEFT rectus muscle is diminished no measurable lesion in this area, thickness of the LEFT rectus muscle on image 103 of series 2 is 13 mm  as compared to 24 mm. The adjacent omental and peritoneal nodularity and stranding is subjectively diminished since the prior study. Musculoskeletal: Spinal degenerative changes. New sclerotic focus in the RIGHT proximal femur in the femoral neck and head junction measuring 14 mm. New sclerotic focus at the T12 level (image 60/2) 13 mm. Sclerosis at the T12 level is present however on the study of May of 2022. Not seen on the PET exam of February 2022. Spinal degenerative changes. IMPRESSION: 1. Decreased loculated pleural fluid and pleural thickening in the chest follows diminishing perihepatic soft tissue and capsular hepatic implants as described. 2. No adenopathy by size criteria in the abdomen or pelvis. Minimal soft tissue in  the intra-aortocaval groove is all that remains on image 77 of series 2 in the retroperitoneum. 3. Decreased bulk of the uterus subjectively and of RIGHT adnexal structures as described. 4. New areas of bony sclerosis compatible with skeletal metastasis. No signs of increased FDG uptake and no visible sclerosis on the previous PET. In the context of improvement of other disease it is possible that this represents treated metastasis. Would suggest attention on follow-up. 5. Loculated RIGHT-sided pleural fluid with signs of pleural thickening in the lower RIGHT chest otherwise with similar appearance. 6. Cholelithiasis without evidence of acute cholecystitis. 7. Aortic atherosclerosis. Electronically Signed   By: Zetta Bills M.D.   On: 03/28/2021 15:52   DG CHEST PORT 1 VIEW  Result Date: 04/01/2021 CLINICAL DATA:  Neutropenic fever EXAM: PORTABLE CHEST 1 VIEW COMPARISON:  March 31, 2021 FINDINGS: Stable small right-sided loculated pleural effusion. Hazy opacification of the right hemithorax with fluid in the minor fissure likely representing a small free-flowing pleural effusion. Atelectasis in the right lung base. Left lung is clear. Right chest wall Port-A-Cath with tip overlying the  right atrium. The heart size and mediastinal contours are unchanged. Thoracic spondylosis with degenerative changes shoulders. IMPRESSION: Stable right-sided pleural fluid with adjacent atelectasis. Left lung is clear. Electronically Signed   By: Dahlia Bailiff MD   On: 04/01/2021 11:37   DG CHEST PORT 1 VIEW  Result Date: 03/31/2021 CLINICAL DATA:  Shortness of breath.  History of ovarian carcinoma EXAM: PORTABLE CHEST 1 VIEW COMPARISON:  Chest radiograph Mar 27, 2021; chest CT March 28, 2021 FINDINGS: Apparent loculated pleural effusion on the right remains. There is also an apparent free-flowing small right pleural effusion, not appreciably changed from recent CT. Atelectasis right base. Left lung clear. Heart is upper normal in size with pulmonary vascularity normal. Port-A-Cath tip is in the right atrium slightly beyond the superior vena cava-right atrium junction. No adenopathy appreciable. Degenerative change in each shoulder noted. No pneumothorax. IMPRESSION: Pleural effusion on the right, primarily loculated, essentially stable compared to recent CT examination. Right base atelectasis noted. Left lung clear. No new opacity evident. Stable cardiac silhouette. Port-A-Cath present on the right. Electronically Signed   By: Lowella Grip III M.D.   On: 03/31/2021 08:12   DG FL GUIDED LUMBAR PUNCTURE  Result Date: 04/21/2021 CLINICAL DATA:  Weakness. EXAM: DIAGNOSTIC LUMBAR PUNCTURE UNDER FLUOROSCOPIC GUIDANCE COMPARISON:  Fluoroscopically guided lumbar puncture 04/16/2021. Lumbar spine MRI 04/11/2021. FLUOROSCOPY TIME:  Fluoroscopy Time:  12 seconds Radiation Exposure Index (if provided by the fluoroscopic device): 0.7 mGy Number of Acquired Spot Images: 0 PROCEDURE: Informed consent was obtained from the patient's son prior to the procedure, including potential complications of headache, allergy, and pain. With the patient prone, the lower back was prepped with Betadine. 1% Lidocaine was used for  local anesthesia. Lumbar puncture was performed at the L4-5 level using a 3.5 inch 20 gauge needle via a right interlaminar approach with return of clear CSF. 6.5 mL of CSF were obtained for laboratory studies. The patient tolerated the procedure well and there were no apparent complications. IMPRESSION: Technically successful fluoroscopically guided lumbar puncture. Electronically Signed   By: Logan Bores M.D.   On: 04/21/2021 14:41   DG FLUORO GUIDE LUMBAR PUNCTURE  Result Date: 04/16/2021 CLINICAL DATA:  Weakness. EXAM: DIAGNOSTIC LUMBAR PUNCTURE UNDER FLUOROSCOPIC GUIDANCE COMPARISON:  Lumbar spine MRI 04/11/2021. FLUOROSCOPY TIME:  Fluoroscopy Time:  24 seconds Radiation Exposure Index (if provided by the fluoroscopic device): 3 mGy Number of  Acquired Spot Images: None PROCEDURE: Informed consent was obtained from the patient prior to the procedure, including a discussion of potential complications including but not limited to headache, allergy, pain, bleeding, infection, spinal cord/nerve root damage. With the patient prone, the lower back was prepped with Betadine. 1% Lidocaine was used for local anesthesia. Lumbar puncture was performed at the L3-L4 level using a 20 gauge needle with return of clear CSF. 16.5 ml of CSF were obtained for laboratory studies. The patient tolerated the procedure well without immediate postprocedure complication. IMPRESSION: Technically successful fluoroscopically guided L3-L4 lumbar puncture. 16.5 mL of CSF obtained for laboratory studies. No immediate postprocedure complication. Electronically Signed   By: Kellie Simmering DO   On: 04/16/2021 09:33    Lab Data:  CBC: Recent Labs  Lab 04/26/21 0552  WBC 5.1  HGB 8.7*  HCT 27.5*  MCV 92.3  PLT 825   Basic Metabolic Panel: Recent Labs  Lab 04/20/21 0511 04/21/21 0500 04/22/21 0500 04/23/21 0452 04/26/21 0552  NA  --   --  135 138 139  K  --   --  3.6 3.7 3.3*  CL  --   --  98 102 102  CO2  --   --  30 29  31   GLUCOSE  --   --  114* 146* 120*  BUN  --   --  14 14 22   CREATININE  --   --  0.42* 0.59 0.35*  CALCIUM  --   --  9.4 9.8 10.0  MG 1.6* 1.5* 1.9  --   --   PHOS  --   --  3.5  --   --    GFR: Estimated Creatinine Clearance: 72.8 mL/min (A) (by C-G formula based on SCr of 0.35 mg/dL (L)). Liver Function Tests: Recent Labs  Lab 04/22/21 0500 04/26/21 0552  AST 18 19  ALT 18 15  ALKPHOS 48 41  BILITOT 0.5 0.5  PROT 8.8* 7.6  ALBUMIN 2.5* 2.6*   No results for input(s): LIPASE, AMYLASE in the last 168 hours. No results for input(s): AMMONIA in the last 168 hours. Coagulation Profile: No results for input(s): INR, PROTIME in the last 168 hours. Cardiac Enzymes: No results for input(s): CKTOTAL, CKMB, CKMBINDEX, TROPONINI in the last 168 hours. BNP (last 3 results) No results for input(s): PROBNP in the last 8760 hours. HbA1C: No results for input(s): HGBA1C in the last 72 hours. CBG: Recent Labs  Lab 04/21/21 1140 04/21/21 1643 04/22/21 0709 04/22/21 1145 04/22/21 1623  GLUCAP 117* 127* 95 119* 151*   Lipid Profile: No results for input(s): CHOL, HDL, LDLCALC, TRIG, CHOLHDL, LDLDIRECT in the last 72 hours. Thyroid Function Tests: No results for input(s): TSH, T4TOTAL, FREET4, T3FREE, THYROIDAB in the last 72 hours. Anemia Panel: No results for input(s): VITAMINB12, FOLATE, FERRITIN, TIBC, IRON, RETICCTPCT in the last 72 hours. Urine analysis:    Component Value Date/Time   COLORURINE AMBER (A) 04/01/2021 0856   APPEARANCEUR CLEAR 04/01/2021 0856   LABSPEC 1.016 04/01/2021 0856   PHURINE 6.0 04/01/2021 0856   GLUCOSEU NEGATIVE 04/01/2021 0856   HGBUR NEGATIVE 04/01/2021 0856   BILIRUBINUR NEGATIVE 04/01/2021 0856   BILIRUBINUR n 03/06/2015 1138   KETONESUR 5 (A) 04/01/2021 0856   PROTEINUR 30 (A) 04/01/2021 0856   UROBILINOGEN 1.0 03/06/2015 1138   NITRITE NEGATIVE 04/01/2021 0856   LEUKOCYTESUR NEGATIVE 04/01/2021 0856     Jernie Schutt M.D. [facility] 04/26/2021, 3:40 PM  Available via Epic secure chat  7am-7pm After 7 pm, please refer to night coverage provider listed on amion.

## 2021-04-26 NOTE — Progress Notes (Signed)
Consultation Note Date: 04/26/2021   Patient Name: Yvette Roberts  DOB: 1956/07/26  MRN: [id number]  Age / Sex: 65 y.o., female  PCP: [name], [place] Referring Physician: Mendel Corning, MD  Reason for Consultation: Establishing goals of care  HPI/Patient Profile: 65 y.o. female  with past medical history of metastatic uterine cancer, diabetes mellitus, hypertension, William pleural effusion admitted with a diagnosis and weight loss.   Her hospital course has been further complicated by bilateral lower extremity weakness of unclear etiology.  Presentation may be most consistent with CIDP and recommendation is for outpatient follow-up with Dr. Posey Pronto from of our neurology.  Her functional status remains poor and palliative consulted for goals of care.  Clinical Assessment and Goals of Care: Palliative care consult received.  Chart reviewed including personal view of pertinent labs and imaging.  I met today with Yvette Roberts.  She was lying in bed in no distress.    I introduced palliative care as specialized medical care for people living with serious illness. It focuses on providing relief from the symptoms and stress of a serious illness. The goal is to improve quality of life for both the patient and the family.  I asked her about what is most important to her and she did not really provide an answer.  I then asked her about her understanding of her situation and what brought her into the hospital.  She noted that she had some back pain, but she is not really able to tell me anything else about her current situation.  I asked about the weakness and she reports that it is "about the same" but cannot really tell me anything else about what she has been told regarding this.   SUMMARY OF RECOMMENDATIONS   -Full code/full scope -Attempted to meet with patient today to discuss goals of care.  She appeared  to be confused and also is difficult to understand due to component of dysarthria. -Await return call from family to set up family meeting to further discuss. Code Status/Advance Care Planning: Full code  Palliative Prophylaxis:  Delirium Protocol  Additional Recommendations (Limitations, Scope, Preferences): Full Scope Treatment  Psycho-social/Spiritual:  Desire for further Chaplaincy support: Not addressed today Additional Recommendations: Caregiving  Support/Resources  Prognosis:  Unable to determine  Discharge Planning: To Be Determined      Primary Diagnoses: Present on Admission:  Hypertension  Protein-calorie malnutrition, severe ([place])  Uterine cancer ([place])  Malignant pleural effusion  Esophagitis   I have reviewed the medical record, interviewed the patient and family, and examined the patient. The following aspects are pertinent.  Past Medical History:  Diagnosis Date   Allergy    Arthritis    Cancer ([place])    COPD (chronic obstructive pulmonary disease) ([place])    Depression    Esophageal reflux 07/21/2014   Frozen shoulder 05/10/2015   Injected 05/10/2015    Obesity, unspecified 07/21/2014   Resistant hypertension 07/21/2014   Type 2 diabetes mellitus without complication ([place]) 11/15/1476   Social  History   Socioeconomic History   Marital status: Divorced    Spouse name: Not on file   Number of children: Not on file   Years of education: Not on file   Highest education level: Not on file  Occupational History   Not on file  Tobacco Use   Smoking status: Never   Smokeless tobacco: Never  Substance and Sexual Activity   Alcohol use: Not on file   Drug use: Not on file   Sexual activity: Not on file  Other Topics Concern   Not on file  Social History Narrative   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 age of onset   Breast cancer Maternal Aunt    Breast cancer Maternal Aunt    Breast cancer Maternal Aunt    Scheduled Meds:  carvedilol  12.5 mg Oral BID WC   chlorhexidine  15 mL Mouth/Throat QID   Chlorhexidine Gluconate Cloth  6 each Topical Daily   feeding supplement  237 mL Oral TID BM   FLUoxetine  20 mg Oral Daily   folic acid  1 mg Oral Daily   gabapentin  200 mg Oral BID   Gerhardt's butt cream   Topical BID   magnesium oxide  800 mg Oral Daily   mouth rinse  15 mL Mouth Rinse q12n4p   melatonin  10 mg Oral QHS   mirtazapine  15 mg Oral QHS   multivitamin with minerals  1 tablet Oral Daily   nutrition supplement (JUVEN)  1 packet Oral BID WC   pantoprazole  40 mg Oral BID   polyethylene glycol  17 g Oral Daily   potassium chloride  40 mEq Oral Daily   protein supplement  1 Scoop Oral TID WC   sucralfate  1 g Oral TID WC & HS   thiamine  100 mg Oral Daily   Continuous Infusions: PRN Meds:.acetaminophen **OR** acetaminophen, lip balm, naLOXone (NARCAN)  injection, ondansetron **OR** ondansetron (ZOFRAN) IV, oxyCODONE, phenol, sodium chloride flush Medications Prior to Admission:  Prior to Admission medications   Medication Sig Start Date End Date Taking? Authorizing Provider  amLODipine (NORVASC) 5 MG tablet Take 5 mg by mouth daily. 03/16/21  Yes [provider]  carvedilol (COREG) 12.5 MG tablet Take 12.5 mg by mouth 2 (two) times daily with a meal.   Yes [provider]  Cholecalciferol (VITAMIN D) 125 MCG (5000 UT) CAPS Take 5,000 Units by mouth daily.   Yes [provider]  dexamethasone (DECADRON) 4 MG tablet TAKE 2 TABLETS BY  MOUTH NIGHT BEFORE AND 2 TABLETS IN THE MORNING OF CHEMO EVERY 3 WEEKS FOR 6 CYCLES 03/22/21 03/22/22 Yes Gorsuch, Ni, MD  famotidine (PEPCID) 20 MG tablet Take 20 mg by mouth 2 (two) times daily. 03/19/21  Yes [provider]  HYDROmorphone (DILAUDID) 4 MG tablet TAKE 1 TABLET BY MOUTH  EVERY 4 HOURS AS NEEDED FOR SEVERE PAIN. Patient taking differently: Take 4 mg by mouth every 4 (four) hours as needed for severe pain. 03/13/21 09/09/21 Yes Gorsuch, Ni, MD  lidocaine (LIDODERM) 5 % Place 1 patch onto the skin daily. Remove & Discard patch within 12 hours or as directed by MD 03/01/21  Yes Alvy Bimler, Ni, MD  omeprazole (PRILOSEC) 40 MG capsule Take 40 mg by mouth daily. 03/13/21  Yes [provider]  ondansetron (ZOFRAN) 8 MG tablet TAKE 1 TABLET BY MOUTH EVERY 8 HOURS AS NEEDED START ON THE THIRD DAY AFTER CHEMO 01/08/21 01/08/22 Yes Gorsuch, Ni, MD  prochlorperazine (COMPAZINE) 10 MG tablet TAKE 1 TABLET BY MOUTH EVERY 6 HOURS AS NEEDED FOR NAUSEA OR VOMITING 01/08/21 01/08/22 Yes Gorsuch, Ni, MD  famotidine (PEPCID) 40 MG/5ML suspension Take 2.5 mLs (20 mg total) by mouth 2 (two) times daily as needed (Heartburn). Patient not taking: Reported on 03/27/2021 03/12/21     FLUoxetine (PROZAC) 20 MG/5ML solution Take 5 mLs (20 mg total) by mouth daily. 04/11/21 05/11/21  Elodia Florence., MD  gabapentin (NEURONTIN) 100 MG capsule Take 2 capsules (200 mg total) by mouth 2 (two) times daily. 04/10/21   Georgette Shell, MD  lidocaine-prilocaine (EMLA) cream APPLY TO AFFECTED AREA ONCE AS DIRECTED Patient not taking: Reported on 03/27/2021 01/08/21 01/08/22  [name], MD  magnesium oxide (MAG-OX) 400 (240 Mg) MG tablet Take 2 tablets (800 mg total) by mouth daily. 04/11/21   Georgette Shell, MD  mirtazapine (REMERON SOL-TAB) 15 MG disintegrating tablet Dissolve  1 tablet (15 mg total) in mouth at bedtime. 03/22/21   [name], MD  Multiple Vitamin (MULTIVITAMIN WITH MINERALS) TABS tablet  Take 1 tablet by mouth daily. 04/11/21   Georgette Shell, MD  Nystatin (GERHARDT'S BUTT CREAM) CREA Apply 1 application topically 3 (three) times daily. 04/10/21   Georgette Shell, MD  pantoprazole sodium (PROTONIX) 40 mg/20 mL PACK Take 20 mLs (40 mg total) by mouth 2 (two) times daily. 04/11/21 06/10/21  Elodia Florence., MD  potassium chloride SA (KLOR-CON) 20 MEQ tablet Take 2 tablets (40 mEq total) by mouth daily. 04/11/21   Georgette Shell, MD  sucralfate (CARAFATE) 1 g tablet Take 1 tablet (1 g total) by mouth 4 (four) times daily -  with meals and at bedtime. 04/10/21   Georgette Shell, MD   Allergies  Allergen Reactions   Eggs Or Egg-Derived Products Hives   Influenza Vaccines Hives   Peanut-Containing Drug Products Hives   Penicillins Hives   Sulfa Antibiotics Hives   Codeine Rash   Review of Systems Unable to obtain due to mental status  Physical Exam General: Alert, awake, in no acute distress.  Somewhat confused. HEENT: No bruits, no goiter, no JVD Heart: Regular rate and rhythm. No murmur appreciated. Lungs: Good air movement, clear Abdomen: Soft, nontender, nondistended, positive bowel sounds.   Ext: No significant edema Skin: Warm and dry  Vital Signs: BP (!) 143/87 (BP Location: Left Arm)   Pulse 90   Temp 98.5 F (36.9 C) (Oral)   Resp 16   Ht $R'5\' 6"'Qz$  (1.676 m)   Wt 75.5 kg   SpO2 98%   BMI 26.87 kg/m  Pain Scale: 0-10 POSS *See Group Information*: 1-Acceptable,Awake and alert Pain Score: 0-No pain   SpO2: SpO2: 98 % O2 Device:SpO2: 98 % O2 Flow Rate: .O2 Flow Rate (L/min): 2 L/min  IO: Intake/output summary:  Intake/Output Summary (  Last 24 hours) at 04/26/2021 0755 Last data filed at 04/26/2021 0449 Gross per 24 hour  Intake 440 ml  Output 1250 ml  Net -810 ml    LBM: Last BM Date: 04/14/21 Baseline Weight: Weight: 75.5 kg Most recent weight: Weight: 75.5 kg     Palliative Assessment/Data:   Flowsheet Rows    Flowsheet  Row Most Recent Value  Intake Tab   Referral Department Hospitalist  Unit at Time of Referral Med/Surg Unit  Palliative Care Primary Diagnosis Cancer  Date Notified 04/24/21  Palliative Care Type New Palliative care  Reason for referral Clarify Goals of Care  Date of Admission 03/27/21  Date first seen by Palliative Care 04/25/21  # of days Palliative referral response time 1 Day(s)  # of days IP prior to Palliative referral 28  Clinical Assessment   Palliative Performance Scale Score 30%  Psychosocial & Spiritual Assessment   Palliative Care Outcomes   Patient/Family meeting held? No       Time In: 1600 Time Out: 1650 Time Total: 50 Greater than 50%  of this time was spent counseling and coordinating care related to the above assessment and plan.  Signed by: Micheline Rough, MD   Please contact Palliative Medicine Team phone at [phone] for questions and concerns.  For individual provider: See Shea Evans

## 2021-04-27 ENCOUNTER — Encounter: Payer: Self-pay | Admitting: Hematology and Oncology

## 2021-04-27 LAB — RESP PANEL BY RT-PCR (FLU A&B, COVID) ARPGX2
Influenza A by PCR: NEGATIVE
Influenza B by PCR: NEGATIVE
SARS Coronavirus 2 by RT PCR: NEGATIVE

## 2021-04-27 NOTE — TOC Progression Note (Signed)
Transition of Care [facility]) - Progression Note    Patient Details  Name: Yvette Roberts MRN: [id number] Date of Birth: [birth year]  [facility] CM/SW Contact  Kei Mcelhiney, Marjie Skiff, RN Phone Number: 04/27/2021, 3:37 PM  Clinical Narrative:   PMT has met with pt and family and plan continues to be for SNF will outpatient palliative follow up. Contacted Kelly at [facility] to make sure bed is still available for her there. They will have a bed for her tomorrow. MD asked to order a covid test today. TOC will continue to follow.  Expected Discharge Plan: Skilled Nursing Facility Barriers to Discharge: Continued Medical Work up  Expected Discharge Plan and Services Expected Discharge Plan: [place]   Discharge Planning Services: CM Consult   Living arrangements for the past 2 months: Apartment Expected Discharge Date: 04/11/21                    Social Determinants of Health (SDOH) Interventions    Readmission Risk Interventions Readmission Risk Prevention Plan 04/05/2021  Transportation Screening Complete  Medication Review Press photographer) Complete  PCP or Specialist appointment within 3-5 days of discharge Complete  HRI or Home Care Consult Complete  SW Recovery Care/Counseling Consult Complete  Palliative Care Screening Not [place] Complete  Some recent data might be hidden

## 2021-04-27 NOTE — Progress Notes (Signed)
  Notified by [facility] manager of patient/family request for [facility] Palliative services at [facility] after discharge.              [place] Palliative team will follow up with patient after discharge.         Please call with any hospice or palliative related questions.         Thank you for the opportunity to participate in this patient's care.     Domenic Moras, BSN, RN [facility] Liaison (listed on [place] under Hospice/[facility])    [phone] (24h on call)

## 2021-04-27 NOTE — Progress Notes (Signed)
Daily Progress Note   Patient Name: Yvette Roberts       Date: 04/27/2021 DOB: May 29, 1956  Age: 65 y.o. MRN#: [id number] Attending Physician: Mendel Corning, MD Primary Care Physician: [name], FNP Admit Date: 03/27/2021  Reason for Consultation/Follow-up: Establishing goals of care  Subjective: I met today with Ms. Krawczyk in conjunction with her sister, brother-in-law, and her son (via phone).  We discussed her clinical course this admission and the concern that her weakness has not improved and this may be her new baseline moving forward.  We discussed overall decline in her nutrition and functional status and how this is related to multiple comorbidities including neurologic problem as well as uterine cancer.  Discussed that treatment for uterine cancer is currently on hold and I am concerned if she will ever regain enough functional status to resume.  She is able to engage somewhat in conversation, however, I am not sure if she really truly has insight into her overall condition.  I also discussed with her sister outside of the room regarding potential discharge and options for care moving forward.  We discussed potential for trial of rehab versus consideration for hospice.  We reviewed potential options for discharge being 1) trial of rehab to see if she can regain functional status, 2) transition home with hospice support (this is not an option as family would not be able to meet her care needs at home), 3) transition to long-term care with hospice support (she does not have a payer source for room and board so this would be out-of-pocket for family), or 4) residential hospice (sister inquired about this, however, at this point I do not think that she would be accepted to residential  hospice as her prognosis is likely longer than 2 weeks).  Length of Stay: 31  Current Medications: Scheduled Meds:   carvedilol  12.5 mg Oral BID WC   chlorhexidine  15 mL Mouth/Throat QID   Chlorhexidine Gluconate Cloth  6 each Topical Daily   feeding supplement  237 mL Oral TID BM   FLUoxetine  20 mg Oral Daily   folic acid  1 mg Oral Daily   gabapentin  200 mg Oral BID   Gerhardt's butt cream   Topical BID   magnesium oxide  800 mg Oral Daily  mouth rinse  15 mL Mouth Rinse q12n4p   melatonin  10 mg Oral QHS   mirtazapine  15 mg Oral QHS   multivitamin with minerals  1 tablet Oral Daily   nutrition supplement (JUVEN)  1 packet Oral BID WC   pantoprazole  40 mg Oral BID   polyethylene glycol  17 g Oral Daily   potassium chloride  40 mEq Oral Daily   protein supplement  1 Scoop Oral TID WC   sucralfate  1 g Oral TID WC & HS   thiamine  100 mg Oral Daily    Continuous Infusions:   PRN Meds: acetaminophen **OR** acetaminophen, lip balm, naLOXone (NARCAN)  injection, ondansetron **OR** ondansetron (ZOFRAN) IV, oxyCODONE, phenol, sodium chloride flush  Physical Exam        General: Alert, awake, in no acute distress.  Somewhat confused (requesting me to send her an email of some sort). HEENT: No bruits, no goiter, no JVD Heart: Regular rate and rhythm. No murmur appreciated. Lungs: Good air movement, clear Abdomen: Soft, nontender, nondistended, positive bowel sounds.   Ext: No significant edema Skin: Warm and dry   Vital Signs: BP (!) 132/97 (BP Location: Right Arm)   Pulse 87   Temp 97.6 F (36.4 C) (Oral)   Resp 18   Ht _0  (1.676 m)   Wt 75.5 kg   SpO2 100%   BMI 26.87 kg/m  SpO2: SpO2: 100 % O2 Device: O2 Device: Room Air O2 Flow Rate: O2 Flow Rate (L/min): 2 L/min  Intake/output summary:  Intake/Output Summary (Last 24 hours) at 04/27/2021 1730 Last data filed at 04/27/2021 0844 Gross per 24 hour  Intake 120 ml  Output 625 ml  Net -505 ml    LBM:  Last BM Date: 04/14/21 Baseline Weight: Weight: 75.5 kg Most recent weight: Weight: 75.5 kg       Palliative Assessment/Data:    Flowsheet Rows    Flowsheet Row Most Recent Value  Intake Tab   Referral Department Hospitalist  Unit at Time of Referral Med/Surg Unit  Palliative Care Primary Diagnosis Cancer  Date Notified 04/24/21  Palliative Care Type New Palliative care  Reason for referral Clarify Goals of Care  Date of Admission 03/27/21  Date first seen by Palliative Care 04/25/21  # of days Palliative referral response time 1 Day(s)  # of days IP prior to Palliative referral 28  Clinical Assessment   Palliative Performance Scale Score 30%  Psychosocial & Spiritual Assessment   Palliative Care Outcomes   Patient/Family meeting held? No       Patient Active Problem List   Diagnosis Date Noted   Pressure injury of skin 04/12/2021   Decreased urine output    Protein-calorie malnutrition, severe ([place]) 03/27/2021   Odynophagia 03/27/2021   Insomnia 03/13/2021   Esophagitis 03/06/2021   Dysphagia 03/06/2021   Hypomagnesemia 03/01/2021   Hypokalemia 02/08/2021   Other constipation 01/30/2021   Abnormal weight loss 01/30/2021   Cancer associated pain 01/08/2021   Goals of care, counseling/discussion 01/08/2021   Malignant pleural effusion 01/03/2021   Metastasis to liver ([place]) 01/03/2021   Uterine cancer ([place]) 12/20/2020   Acute hypoxemic respiratory failure ([place]) 11/23/2020   Pleural effusion, right 11/23/2020   Atelectasis of right lung 11/23/2020   Hyponatremia 11/23/2020   Anxiety 05/09/2020   Trigger point of right shoulder region 03/09/2019   Cervical radiculopathy at C8 03/09/2019   Unilateral primary osteoarthritis, left knee 11/17/2017   Unilateral primary osteoarthritis, right knee  11/17/2017   Degenerative arthritis of knee, bilateral 07/14/2017   Baker cyst, left 06/16/2017   Pain and swelling of left lower extremity 05/26/2017   Degenerative joint  disease of knee, left 05/26/2017   Hyperlipemia 06/12/2016   MDD (major depressive disorder) 06/12/2016   Urticaria 06/12/2016   Type 2 diabetes mellitus without complication ([place]) 97/53/0051   Class II obesity 07/21/2014   Hypertension 07/21/2014   Esophageal reflux 07/21/2014    Palliative Care Assessment & Plan   Patient Profile: 65 year old African-American female with past medical history of metastatic uterine cancer, diabetes, hypertension, volume pleural effusion admitted with odynophagia and weight loss.  She was found to have severe esophagitis and her hospital course been further complicated by predominantly lower extremity weakness thought to be secondary to either nutritional deficiency or CIDP.  Palliative care consulted for goals of care.  Recommendations/Plan: I had another long meeting with family today to discuss options for care moving forward.  Overall, Ms. Marciel has continued decline from multiple comorbidities including neurologic decline/extremity weakness of undetermined etiology as well as underlying uterine cancer. Attempted to discuss with Ms. Cassar today to see if she understands her situation.  She is able to participate somewhat in conversation, however, I am still not sure she has full understanding of her overall situation. Following meeting, family would like for me to touch base again with transition of care team to determine realistic options for discharge leaving the hospital.  Following our conversation, we may be looking at a transition back to skilled facility to see how much functional status she can regain.  If, however, after a trial of rehab she is not able to make gains in nutrition and functional status, she would likely be well served by hospice support.  Family noted interest in residential hospice, however, at this point I do not think she would qualify with an expected prognosis of greater than 2 weeks.  Code Status:    Code Status Orders  (From  admission, onward)           Start     Ordered   03/27/21 1744  Full code  Continuous        03/27/21 1743           Code Status History     Date Active Date Inactive Code Status Order ID Comments User Context   11/23/2020 0531 11/30/2020 1819 Full Code [id number]  Renee Pain, MD ED      Advance Directive Documentation    [place] Most Recent Value  Type of Advance Directive Living will  Pre-existing out of facility DNR order (yellow form or pink MOST form) --  "MOST" Form in Place? --       Prognosis:  < 6 months  Discharge Planning: To Be Determined  Care plan was discussed with sister, son, patient, and bedside RN  Thank you for allowing the Palliative Medicine Team to assist in the care of this patient.   Time In: 1430 Time Out: 1520 Total Time 50 Prolonged Time Billed No   Greater than 50%  of this time was spent counseling and coordinating care related to the above assessment and plan.  Micheline Rough, MD  Please contact Palliative Medicine Team phone at [phone] for questions and concerns.

## 2021-04-27 NOTE — Progress Notes (Signed)
[facility]                                                                              Patient Demographics  Yvette Roberts, is a 65 y.o. female, DOB - [birth year], QJJ:[id number]  Admit date - 03/27/2021   Admitting Physician Domenic Polite, MD  Outpatient Primary MD for the patient is [name], [place]  Outpatient specialists:   LOS - 31  days   Medical records reviewed and are as summarized below:    No chief complaint on file.      Brief summary   65 year old African-American female with past medical history of metastatic uterine cancer, diabetes mellitus type 2, hypertension, malignant pleural effusion who was admitted with odynophagia and weight loss.  She reported 50 pound weight loss in past 3 months.  Also underwent further work-up had a EGD and found to have nonsevere esophagitis in proximal and distal esophagus as well as scattered mild inflammation with erythema in the entire stomach.  She was started on Protonix and sucralfate.  Hospitalization was complicated by febrile neutropenia and Pseudomonas UTI.  Patient is s/p antibiotics treatment.  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 -Completed IVIG on 6/20-6/24 for 5 days with no improvement in the weakness -Status post LP x2, oligoclonal bands 0.,  Unremarkable.  CSF cultures negative. -HSV type I/II antibody IgG CSF were elevated to 2.54.  Neurology and ID recommended repeat LP for PCR. -LP repeated on 6/26, patient was started on empiric acyclovir to be discontinued  if PCR negative.  Acyclovir discontinued on 6/28 -Follow VDRL, cytology, IgG CSF -Folate 4.4, was placed on supplementation, B6 is still pending -Serum protein electrophoresis did not show any M spike, has nonspecific polyclonal increase in gamma globulin (can be seen in autoimmune disease states) -Per neurology, presentation likely due to nutritional deficiency versus potential CIDP, recommended outpatient follow-up with Dr. Narda Amber with [facility] neurology.  May need outpatient EMG/NCS -Awaiting palliative goals of care, no significant improvement in the lower extremity weakness   Active problems Odynophagia, esophagitis  -EGD 6/2 showed mild nonsevere esophagitis in the proximal and distal esophagus, scattered mild inflammation with erythema in the entire stomach -Biopsy showed reactive gastropathy, negative H. pylori.  No metaplasia or dysplasia or malignancy -Outpatient follow-up with GI -Currently no acute issues, continue Protonix twice daily, Carafate 1 g p.o. 3 times daily per GI recommendations  Febrile neutropenia -Patient has completed 7-day course of cefepime, discontinued on 6/11 -Afebrile  Hypokalemia -On daily replacement, will recheck BMET in a.m.    Pancytopenia -H&H stable, hemoglobin 8.7  Metastatic uterine cancer -Followed by oncology, currently holding off chemotherapy -Outpatient follow-up with oncology -Awaiting palliative medicine GOC  Essential hypertension -Stable, continue Coreg  Diabetes mellitus type 2, NIDDM, appears to be diet controlled -CBGs  currently stable, hemoglobin A1c 6.1 -Outpatient follow-up with PCP  Malignant pleural effusion -CT scan showed no PE, loculated right pleural effusion is noted, not appreciably changed since the prior PET scan  -Currently no hypoxia or respiratory distress, outpatient follow-up with oncology  Pressure injury Posterior sacrum, medial lower stage II -Wound care per nursing  Severe protein calorie  malnutrition secondary to poor p.o. intake and malignancy -Continue nutritional supplements Estimated body mass index is 26.87 kg/m as calculated from the following:   Height as of this encounter: 5\' 6"  (1.676 m).   Weight as of this encounter: 75.5 kg.  Code Status: Full CODE STATUS DVT Prophylaxis:  Place and maintain sequential compression device Start: 04/15/21 0822   Level of Care: Level of care: Med-Surg Family Communication: Discussed all imaging results, lab results, explained to the patient.  Awaiting family for [place]  Disposition Plan:     Status is: Inpatient  Remains inpatient appropriate because:Inpatient level of care appropriate due to severity of illness  Dispo: The patient is from: Home              Anticipated d/c is to: SNF              Patient currently is not medically stable to d/c.  PT evaluation recommended skilled nursing facility.  No significant improvement   Difficult to place patient No      Time Spent in minutes   25 minutes  Procedures:  LP x2  Consultants:   Neurology Infectious disease Palliative medicine  Antimicrobials:   Anti-infectives (From admission, onward)    Start     Dose/Rate Route Frequency Ordered Stop   04/22/21 1200  acyclovir (ZOVIRAX) 750 mg in dextrose 5 % 150 mL IVPB  Status:  Discontinued        750 mg 165 mL/hr over 60 Minutes Intravenous Every 8 hours 04/22/21 1030 04/24/21 1040   04/01/21 0930  ceFEPIme (MAXIPIME) 2 g in sodium chloride 0.9 % 100 mL IVPB  Status:  Discontinued        2 g 200 mL/hr over 30 Minutes Intravenous Every 8 hours 04/01/21 0839 04/07/21 1345   03/27/21 1800  fluconazole (DIFLUCAN) IVPB 200 mg  Status:  Discontinued        200 mg 100 mL/hr over 60 Minutes Intravenous Daily 03/27/21 1734 03/29/21 1633          Medications  Scheduled Meds:  carvedilol  12.5 mg Oral BID WC   chlorhexidine  15 mL Mouth/Throat QID   Chlorhexidine Gluconate Cloth  6 each Topical Daily   feeding  supplement  237 mL Oral TID BM   FLUoxetine  20 mg Oral Daily   folic acid  1 mg Oral Daily   gabapentin  200 mg Oral BID   Gerhardt's butt cream   Topical BID   magnesium oxide  800 mg Oral Daily   mouth rinse  15 mL Mouth Rinse q12n4p   melatonin  10 mg Oral QHS   mirtazapine  15 mg Oral QHS   multivitamin with minerals  1 tablet Oral Daily   nutrition supplement (JUVEN)  1 packet Oral BID WC   pantoprazole  40 mg Oral BID   polyethylene glycol  17 g Oral Daily   potassium chloride  40 mEq Oral Daily   protein supplement  1 Scoop Oral TID WC   sucralfate  1 g Oral TID WC & HS   thiamine  100 mg Oral Daily  Continuous Infusions: PRN Meds:.acetaminophen **OR** acetaminophen, lip balm, naLOXone (NARCAN)  injection, ondansetron **OR** ondansetron (ZOFRAN) IV, oxyCODONE, phenol, sodium chloride flush      Subjective:   Yvette Roberts was seen and examined today.  Still somewhat confused, no significant improvement in the lower extremity weakness.  No acute events overnight.  No nausea or vomiting, chest pain or shortness of breath.  Had a BM today  Objective:   Vitals:   04/26/21 1354 04/26/21 2113 04/27/21 0542 04/27/21 1343  BP: 131/90 (!) 147/95 (!) 128/94 (!) 132/97  Pulse: 90 96 96 87  Resp: 16 16 18 18   Temp: 98.3 F (36.8 C) 98.8 F (37.1 C) 98 F (36.7 C) 97.6 F (36.4 C)  TempSrc: Oral Oral Oral Oral  SpO2: 100% 96% 97% 100%  Weight:      Height:        Intake/Output Summary (Last 24 hours) at 04/27/2021 1425 Last data filed at 04/27/2021 0844 Gross per 24 hour  Intake 120 ml  Output 625 ml  Net -505 ml     Wt Readings from Last 3 Encounters:  04/16/21 75.5 kg  03/22/21 76.9 kg  03/01/21 84.4 kg    Physical Exam General: Alert and oriented x self, pleasantly confused Cardiovascular: S1 S2 clear, RRR. No pedal edema b/l Respiratory: Fairly CTA B Gastrointestinal: Soft, nontender, nondistended, NBS Ext: no pedal edema bilaterally Neuro: 1/5 strength  in lower extremities Psych: oriented to self, confused    Data Reviewed:  I have personally reviewed following labs and imaging studies  Micro Results No results found for this or any previous visit (from the past 240 hour(s)).   Radiology Reports MR BRAIN W WO CONTRAST  Result Date: 04/13/2021 CLINICAL DATA:  bilateral lower extremity weakness. EXAM: MRI HEAD WITHOUT AND WITH CONTRAST TECHNIQUE: Multiplanar, multiecho pulse sequences of the brain and surrounding structures were obtained without and with intravenous contrast. CONTRAST:  7.26mL GADAVIST GADOBUTROL 1 MMOL/ML IV SOLN COMPARISON:  None. FINDINGS: Brain: No acute infarct, mass effect or extra-axial collection. No acute or chronic hemorrhage. There is multifocal hyperintense T2-weighted signal within the white matter. Generalized volume loss without a clear lobar predilection. Hyperintense T2-weighted signal within the brainstem is likely due to chronic small vessel disease. The midline structures are normal. There is no abnormal contrast enhancement. Vascular: Major flow voids are preserved. Skull and upper cervical spine: Normal calvarium and skull base. Visualized upper cervical spine and soft tissues are normal. Sinuses/Orbits:No paranasal sinus fluid levels or advanced mucosal thickening. No mastoid or middle ear effusion. Normal orbits. IMPRESSION: 1. No acute intracranial abnormality. 2. Chronic small vessel disease and generalized volume loss. Electronically Signed   By: Ulyses Jarred M.D.   On: 04/13/2021 23:43   MR CERVICAL SPINE W WO CONTRAST  Result Date: 04/13/2021 CLINICAL DATA:  Bilateral lower extremity weakness EXAM: MRI CERVICAL AND THORACIC SPINE WITHOUT AND WITH CONTRAST TECHNIQUE: Multiplanar and multiecho pulse sequences of the cervical spine, to include the craniocervical junction and cervicothoracic junction, and the thoracic spine, were obtained without and with intravenous contrast. CONTRAST:  7.3mL GADAVIST  GADOBUTROL 1 MMOL/ML IV SOLN COMPARISON:  None. FINDINGS: MRI CERVICAL SPINE FINDINGS Alignment: Physiologic. Vertebrae: No fracture, evidence of discitis, or bone lesion. Cord: Normal signal and morphology. No abnormal contrast enhancement. Posterior Fossa, vertebral arteries, paraspinal tissues: Negative. Disc levels: C1-2: Unremarkable. C2-3: Normal disc space and facet joints. There is no spinal canal stenosis. No neural foraminal stenosis. C3-4: Small right asymmetric disc bulge with  uncovertebral hypertrophy. There is no spinal canal stenosis. Mild right neural foraminal stenosis. C4-5: Small disc bulge. There is no spinal canal stenosis. No neural foraminal stenosis. C5-6: Normal disc space and facet joints. There is no spinal canal stenosis. No neural foraminal stenosis. C6-7: Normal disc space and facet joints. There is no spinal canal stenosis. No neural foraminal stenosis. C7-T1: Normal disc space and facet joints. There is no spinal canal stenosis. No neural foraminal stenosis. MRI THORACIC SPINE FINDINGS Alignment:  Physiologic. Vertebrae: No fracture, evidence of discitis, or bone lesion. Cord: Normal signal and morphology. No abnormal contrast enhancement. Paraspinal and other soft tissues: Loculated fluid collection in the posterior right mid chest. Disc levels: T4-5: Small central disc protrusion without stenosis. T5-6: Small central disc protrusion without stenosis. T6-7: Small central disc protrusion effacing the ventral thecal sac. No spinal canal stenosis. T7-8: Minimal disc bulge without stenosis. T10-11: Small right subarticular disc protrusion with mild right foraminal narrowing. No spinal canal stenosis. The other thoracic levels are unremarkable. IMPRESSION: 1. No acute abnormality of the cervical or thoracic spine. 2. Mild cervical degenerative disc disease without spinal canal stenosis. 3. Mild right C3-4 and right T10-11 neural foraminal stenosis. 4. Loculated fluid collection in the  posterior right mid chest, as previously seen on PET-CT. Electronically Signed   By: Ulyses Jarred M.D.   On: 04/13/2021 23:54   MR THORACIC SPINE W WO CONTRAST  Result Date: 04/13/2021 CLINICAL DATA:  Bilateral lower extremity weakness EXAM: MRI CERVICAL AND THORACIC SPINE WITHOUT AND WITH CONTRAST TECHNIQUE: Multiplanar and multiecho pulse sequences of the cervical spine, to include the craniocervical junction and cervicothoracic junction, and the thoracic spine, were obtained without and with intravenous contrast. CONTRAST:  7.86mL GADAVIST GADOBUTROL 1 MMOL/ML IV SOLN COMPARISON:  None. FINDINGS: MRI CERVICAL SPINE FINDINGS Alignment: Physiologic. Vertebrae: No fracture, evidence of discitis, or bone lesion. Cord: Normal signal and morphology. No abnormal contrast enhancement. Posterior Fossa, vertebral arteries, paraspinal tissues: Negative. Disc levels: C1-2: Unremarkable. C2-3: Normal disc space and facet joints. There is no spinal canal stenosis. No neural foraminal stenosis. C3-4: Small right asymmetric disc bulge with uncovertebral hypertrophy. There is no spinal canal stenosis. Mild right neural foraminal stenosis. C4-5: Small disc bulge. There is no spinal canal stenosis. No neural foraminal stenosis. C5-6: Normal disc space and facet joints. There is no spinal canal stenosis. No neural foraminal stenosis. C6-7: Normal disc space and facet joints. There is no spinal canal stenosis. No neural foraminal stenosis. C7-T1: Normal disc space and facet joints. There is no spinal canal stenosis. No neural foraminal stenosis. MRI THORACIC SPINE FINDINGS Alignment:  Physiologic. Vertebrae: No fracture, evidence of discitis, or bone lesion. Cord: Normal signal and morphology. No abnormal contrast enhancement. Paraspinal and other soft tissues: Loculated fluid collection in the posterior right mid chest. Disc levels: T4-5: Small central disc protrusion without stenosis. T5-6: Small central disc protrusion without  stenosis. T6-7: Small central disc protrusion effacing the ventral thecal sac. No spinal canal stenosis. T7-8: Minimal disc bulge without stenosis. T10-11: Small right subarticular disc protrusion with mild right foraminal narrowing. No spinal canal stenosis. The other thoracic levels are unremarkable. IMPRESSION: 1. No acute abnormality of the cervical or thoracic spine. 2. Mild cervical degenerative disc disease without spinal canal stenosis. 3. Mild right C3-4 and right T10-11 neural foraminal stenosis. 4. Loculated fluid collection in the posterior right mid chest, as previously seen on PET-CT. Electronically Signed   By: Ulyses Jarred M.D.   On: 04/13/2021 23:54  MR Lumbar Spine W Wo Contrast  Result Date: 04/11/2021 CLINICAL DATA:  Metastatic uterine cancer. Unable to move the lower extremities. EXAM: MRI LUMBAR SPINE WITHOUT AND WITH CONTRAST TECHNIQUE: Multiplanar and multiecho pulse sequences of the lumbar spine were obtained without and with intravenous contrast. CONTRAST:  19mL GADAVIST GADOBUTROL 1 MMOL/ML IV SOLN COMPARISON:  None. FINDINGS: Segmentation:  5 lumbar type vertebral bodies assumed. Alignment:  No malalignment. Vertebrae: No evidence of regional metastatic disease. A small focus of edema and enhancement at the inferior endplate of H37 is favored to be discogenic. Conus medullaris and cauda equina: Conus extends to the L1 level. Conus and cauda equina appear normal. Paraspinal and other soft tissues: Enlarged uterus. Disc levels: Minimal non-compressive disc bulges from T11-12 through L1-2. L2-3: Endplate osteophytes and moderate bulging of the disc. Mild facet and ligamentous hypertrophy. Moderate canal stenosis, but without definite focal neural compression. Foraminal stenosis on the left primarily due to encroachment by facet arthropathy could affect the exiting left L2 nerve. L3-4: Endplate osteophytes and bulging of the disc. Facet and ligamentous hypertrophy. Moderate canal  stenosis. Bilateral lateral recess stenosis could cause neural compression on either or both sides. Foraminal narrowing worse on the right could possibly affect the exiting right L3 nerve. L4-5: Endplate osteophytes and bulging of the disc. Facet and ligamentous hypertrophy. Mild stenosis of both lateral recesses but without definite neural compression. L5-S1: Mild bulging of the disc. No compressive canal or foraminal stenosis. IMPRESSION: No evidence of regional metastatic disease. Small focus of edema and enhancement at the inferior endplate of J69 favored to be discogenic in nature. Even if this did represent a small bone metastasis, it would not relate to the clinical presentation. Multilevel degenerative disc disease and degenerative facet disease throughout the lumbar region as above. Some potential for neural compression as outlined at each level above, but I would not expect these abnormalities to present with an inability to move the lower extremities. Electronically Signed   By: Nelson Chimes M.D.   On: 04/11/2021 16:41   DG CHEST PORT 1 VIEW  Result Date: 04/01/2021 CLINICAL DATA:  Neutropenic fever EXAM: PORTABLE CHEST 1 VIEW COMPARISON:  March 31, 2021 FINDINGS: Stable small right-sided loculated pleural effusion. Hazy opacification of the right hemithorax with fluid in the minor fissure likely representing a small free-flowing pleural effusion. Atelectasis in the right lung base. Left lung is clear. Right chest wall Port-A-Cath with tip overlying the right atrium. The heart size and mediastinal contours are unchanged. Thoracic spondylosis with degenerative changes shoulders. IMPRESSION: Stable right-sided pleural fluid with adjacent atelectasis. Left lung is clear. Electronically Signed   By: Dahlia Bailiff MD   On: 04/01/2021 11:37   DG CHEST PORT 1 VIEW  Result Date: 03/31/2021 CLINICAL DATA:  Shortness of breath.  History of ovarian carcinoma EXAM: PORTABLE CHEST 1 VIEW COMPARISON:  Chest  radiograph Mar 27, 2021; chest CT March 28, 2021 FINDINGS: Apparent loculated pleural effusion on the right remains. There is also an apparent free-flowing small right pleural effusion, not appreciably changed from recent CT. Atelectasis right base. Left lung clear. Heart is upper normal in size with pulmonary vascularity normal. Port-A-Cath tip is in the right atrium slightly beyond the superior vena cava-right atrium junction. No adenopathy appreciable. Degenerative change in each shoulder noted. No pneumothorax. IMPRESSION: Pleural effusion on the right, primarily loculated, essentially stable compared to recent CT examination. Right base atelectasis noted. Left lung clear. No new opacity evident. Stable cardiac silhouette. Port-A-Cath present on  the right. Electronically Signed   By: Lowella Grip III M.D.   On: 03/31/2021 08:12   DG FL GUIDED LUMBAR PUNCTURE  Result Date: 04/21/2021 CLINICAL DATA:  Weakness. EXAM: DIAGNOSTIC LUMBAR PUNCTURE UNDER FLUOROSCOPIC GUIDANCE COMPARISON:  Fluoroscopically guided lumbar puncture 04/16/2021. Lumbar spine MRI 04/11/2021. FLUOROSCOPY TIME:  Fluoroscopy Time:  12 seconds Radiation Exposure Index (if provided by the fluoroscopic device): 0.7 mGy Number of Acquired Spot Images: 0 PROCEDURE: Informed consent was obtained from the patient's son prior to the procedure, including potential complications of headache, allergy, and pain. With the patient prone, the lower back was prepped with Betadine. 1% Lidocaine was used for local anesthesia. Lumbar puncture was performed at the L4-5 level using a 3.5 inch 20 gauge needle via a right interlaminar approach with return of clear CSF. 6.5 mL of CSF were obtained for laboratory studies. The patient tolerated the procedure well and there were no apparent complications. IMPRESSION: Technically successful fluoroscopically guided lumbar puncture. Electronically Signed   By: Logan Bores M.D.   On: 04/21/2021 14:41   DG FLUORO  GUIDE LUMBAR PUNCTURE  Result Date: 04/16/2021 CLINICAL DATA:  Weakness. EXAM: DIAGNOSTIC LUMBAR PUNCTURE UNDER FLUOROSCOPIC GUIDANCE COMPARISON:  Lumbar spine MRI 04/11/2021. FLUOROSCOPY TIME:  Fluoroscopy Time:  24 seconds Radiation Exposure Index (if provided by the fluoroscopic device): 3 mGy Number of Acquired Spot Images: None PROCEDURE: Informed consent was obtained from the patient prior to the procedure, including a discussion of potential complications including but not limited to headache, allergy, pain, bleeding, infection, spinal cord/nerve root damage. With the patient prone, the lower back was prepped with Betadine. 1% Lidocaine was used for local anesthesia. Lumbar puncture was performed at the L3-L4 level using a 20 gauge needle with return of clear CSF. 16.5 ml of CSF were obtained for laboratory studies. The patient tolerated the procedure well without immediate postprocedure complication. IMPRESSION: Technically successful fluoroscopically guided L3-L4 lumbar puncture. 16.5 mL of CSF obtained for laboratory studies. No immediate postprocedure complication. Electronically Signed   By: Kellie Simmering DO   On: 04/16/2021 09:33    Lab Data:  CBC: Recent Labs  Lab 04/26/21 0552  WBC 5.1  HGB 8.7*  HCT 27.5*  MCV 92.3  PLT 973   Basic Metabolic Panel: Recent Labs  Lab 04/21/21 0500 04/22/21 0500 04/23/21 0452 04/26/21 0552  NA  --  135 138 139  K  --  3.6 3.7 3.3*  CL  --  98 102 102  CO2  --  30 29 31   GLUCOSE  --  114* 146* 120*  BUN  --  14 14 22   CREATININE  --  0.42* 0.59 0.35*  CALCIUM  --  9.4 9.8 10.0  MG 1.5* 1.9  --   --   PHOS  --  3.5  --   --    GFR: Estimated Creatinine Clearance: 72.8 mL/min (A) (by C-G formula based on SCr of 0.35 mg/dL (L)). Liver Function Tests: Recent Labs  Lab 04/22/21 0500 04/26/21 0552  AST 18 19  ALT 18 15  ALKPHOS 48 41  BILITOT 0.5 0.5  PROT 8.8* 7.6  ALBUMIN 2.5* 2.6*   No results for input(s): LIPASE, AMYLASE in  the last 168 hours. No results for input(s): AMMONIA in the last 168 hours. Coagulation Profile: No results for input(s): INR, PROTIME in the last 168 hours. Cardiac Enzymes: No results for input(s): CKTOTAL, CKMB, CKMBINDEX, TROPONINI in the last 168 hours. BNP (last 3 results) No results for  input(s): PROBNP in the last 8760 hours. HbA1C: No results for input(s): HGBA1C in the last 72 hours. CBG: Recent Labs  Lab 04/21/21 1140 04/21/21 1643 04/22/21 0709 04/22/21 1145 04/22/21 1623  GLUCAP 117* 127* 95 119* 151*   Lipid Profile: No results for input(s): CHOL, HDL, LDLCALC, TRIG, CHOLHDL, LDLDIRECT in the last 72 hours. Thyroid Function Tests: No results for input(s): TSH, T4TOTAL, FREET4, T3FREE, THYROIDAB in the last 72 hours. Anemia Panel: No results for input(s): VITAMINB12, FOLATE, FERRITIN, TIBC, IRON, RETICCTPCT in the last 72 hours. Urine analysis:    Component Value Date/Time   COLORURINE AMBER (A) 04/01/2021 0856   APPEARANCEUR CLEAR 04/01/2021 0856   LABSPEC 1.016 04/01/2021 0856   PHURINE 6.0 04/01/2021 0856   GLUCOSEU NEGATIVE 04/01/2021 0856   HGBUR NEGATIVE 04/01/2021 0856   BILIRUBINUR NEGATIVE 04/01/2021 0856   BILIRUBINUR n 03/06/2015 1138   KETONESUR 5 (A) 04/01/2021 0856   PROTEINUR 30 (A) 04/01/2021 0856   UROBILINOGEN 1.0 03/06/2015 1138   NITRITE NEGATIVE 04/01/2021 0856   LEUKOCYTESUR NEGATIVE 04/01/2021 0856     Yvette Roberts M.D. [facility] 04/27/2021, 2:25 PM  Available via Epic secure chat 7am-7pm After 7 pm, please refer to night coverage provider listed on amion.

## 2021-04-27 NOTE — Progress Notes (Signed)
Daily Progress Note   Patient Name: Yvette Roberts       Date: 04/27/2021 DOB: 11-May-1956  Age: 65 y.o. MRN#: [id number] Attending Physician: Mendel Corning, MD Primary Care Physician: [name], FNP Admit Date: 03/27/2021  Reason for Consultation/Follow-up: Establishing goals of care  Subjective: I saw and examined Ms. Warn today.  She was awake and alert and did seem to be able to better engage in conversation today, however, I still do not feel that she has any insight into her condition.  I called and was able to reach patient's sister and we conferenced in her son for family meeting this evening.  See below.  Length of Stay: 31  Current Medications: Scheduled Meds:   carvedilol  12.5 mg Oral BID WC   chlorhexidine  15 mL Mouth/Throat QID   Chlorhexidine Gluconate Cloth  6 each Topical Daily   feeding supplement  237 mL Oral TID BM   FLUoxetine  20 mg Oral Daily   folic acid  1 mg Oral Daily   gabapentin  200 mg Oral BID   Gerhardt's butt cream   Topical BID   magnesium oxide  800 mg Oral Daily   mouth rinse  15 mL Mouth Rinse q12n4p   melatonin  10 mg Oral QHS   mirtazapine  15 mg Oral QHS   multivitamin with minerals  1 tablet Oral Daily   nutrition supplement (JUVEN)  1 packet Oral BID WC   pantoprazole  40 mg Oral BID   polyethylene glycol  17 g Oral Daily   potassium chloride  40 mEq Oral Daily   protein supplement  1 Scoop Oral TID WC   sucralfate  1 g Oral TID WC & HS   thiamine  100 mg Oral Daily    Continuous Infusions:   PRN Meds: acetaminophen **OR** acetaminophen, lip balm, naLOXone (NARCAN)  injection, ondansetron **OR** ondansetron (ZOFRAN) IV, oxyCODONE, phenol, sodium chloride flush  Physical Exam        General: Alert, awake, in no acute  distress.  Somewhat confused (requesting me to send her an email of some sort). HEENT: No bruits, no goiter, no JVD Heart: Regular rate and rhythm. No murmur appreciated. Lungs: Good air movement, clear Abdomen: Soft, nontender, nondistended, positive bowel sounds.   Ext: No significant edema  Skin: Warm and dry   Vital Signs: BP (!) 128/94   Pulse 96   Temp 98 F (36.7 C) (Oral)   Resp 18   Ht 5\' 6"  (1.676 m)   Wt 75.5 kg   SpO2 97%   BMI 26.87 kg/m  SpO2: SpO2: 97 % O2 Device: O2 Device: Room Air O2 Flow Rate: O2 Flow Rate (L/min): 2 L/min  Intake/output summary:  Intake/Output Summary (Last 24 hours) at 04/27/2021 6712 Last data filed at 04/27/2021 0601 Gross per 24 hour  Intake 100 ml  Output 1025 ml  Net -925 ml   LBM: Last BM Date: 04/14/21 Baseline Weight: Weight: 75.5 kg Most recent weight: Weight: 75.5 kg       Palliative Assessment/Data:    Flowsheet Rows    Flowsheet Row Most Recent Value  Intake Tab   Referral Department Hospitalist  Unit at Time of Referral Med/Surg Unit  Palliative Care Primary Diagnosis Cancer  Date Notified 04/24/21  Palliative Care Type New Palliative care  Reason for referral Clarify Goals of Care  Date of Admission 03/27/21  Date first seen by Palliative Care 04/25/21  # of days Palliative referral response time 1 Day(s)  # of days IP prior to Palliative referral 28  Clinical Assessment   Palliative Performance Scale Score 30%  Psychosocial & Spiritual Assessment   Palliative Care Outcomes   Patient/Family meeting held? No       Patient Active Problem List   Diagnosis Date Noted   Pressure injury of skin 04/12/2021   Decreased urine output    Protein-calorie malnutrition, severe ([place]) 03/27/2021   Odynophagia 03/27/2021   Insomnia 03/13/2021   Esophagitis 03/06/2021   Dysphagia 03/06/2021   Hypomagnesemia 03/01/2021   Hypokalemia 02/08/2021   Other constipation 01/30/2021   Abnormal weight loss 01/30/2021    Cancer associated pain 01/08/2021   Goals of care, counseling/discussion 01/08/2021   Malignant pleural effusion 01/03/2021   Metastasis to liver ([place]) 01/03/2021   Uterine cancer ([place]) 12/20/2020   Acute hypoxemic respiratory failure ([place]) 11/23/2020   Pleural effusion, right 11/23/2020   Atelectasis of right lung 11/23/2020   Hyponatremia 11/23/2020   Anxiety 05/09/2020   Trigger point of right shoulder region 03/09/2019   Cervical radiculopathy at C8 03/09/2019   Unilateral primary osteoarthritis, left knee 11/17/2017   Unilateral primary osteoarthritis, right knee 11/17/2017   Degenerative arthritis of knee, bilateral 07/14/2017   Baker cyst, left 06/16/2017   Pain and swelling of left lower extremity 05/26/2017   Degenerative joint disease of knee, left 05/26/2017   Hyperlipemia 06/12/2016   MDD (major depressive disorder) 06/12/2016   Urticaria 06/12/2016   Type 2 diabetes mellitus without complication ([place]) 45/80/9983   Class II obesity 07/21/2014   Hypertension 07/21/2014   Esophageal reflux 07/21/2014    Palliative Care Assessment & Plan   Patient Profile: 65 year old African-American female with past medical history of metastatic uterine cancer, diabetes, hypertension, volume pleural effusion admitted with odynophagia and weight loss.  She was found to have severe esophagitis and her hospital course been further complicated by predominantly lower extremity weakness thought to be secondary to either nutritional deficiency or CIDP.  Palliative care consulted for goals of care.  Recommendations/Plan: Full code/full scope Long discussion today with patient's son and sister.  Reviewed her clinical course and concern that she has multiple comorbidities that are not going to go away.  Family had been hopeful for recovery of functional status with further rehabilitation, however, we discussed today that  I am concerned this is a new permanent baseline and she will have further  functional decline as her cancer progresses.  Her sister is a retired [employer] scientist and she tells me that she has been concerned that her sister may be more on a trajectory to consider hospice support.  We discussed potential options moving forward including trial of rehab with plan to transition to hospice if she is not able to regain functional status versus consideration for hospice support at this time.  Her son reports that he does not believe she is aware of this fact and thinks that she needs to be involved in another discussion regarding her current clinical situation and likely pathways forward for her care.  In meeting with her the past couple of days, I did not feel that she is really has insight into her condition, however, her mental status may improve with familiar faces such as family present. Plan is for another family meeting with Ms. Rossie and her sister tomorrow at 2:30 PM.  Her son will also likely join via phone.   Code Status:    Code Status Orders  (From admission, onward)           Start     Ordered   03/27/21 1744  Full code  Continuous        03/27/21 1743           Code Status History     Date Active Date Inactive Code Status Order ID Comments User Context   11/23/2020 0531 11/30/2020 1819 Full Code [id number]  Renee Pain, MD ED      Advance Directive Documentation    [place] Most Recent Value  Type of Advance Directive Living will  Pre-existing out of facility DNR order (yellow form or pink MOST form) --  "MOST" Form in Place? --       Prognosis:  < 6 months  Discharge Planning: To Be Determined  Care plan was discussed with sister, son, patient, and bedside RN  Thank you for allowing the Palliative Medicine Team to assist in the care of this patient.   Time In: 1840 Time Out: 1925 Total Time 45 Prolonged Time Billed No      Greater than 50%  of this time was spent counseling and coordinating care related to the above assessment and  plan.  Micheline Rough, MD  Please contact Palliative Medicine Team phone at [phone] for questions and concerns.

## 2021-04-28 LAB — VITAMIN B1: Vitamin B1 (Thiamine): 36 nmol/L — ABNORMAL LOW (ref 66.5–200.0)

## 2021-04-28 LAB — VITAMIN B6: Vitamin B6: 3.6 ug/L (ref 3.4–65.2)

## 2021-04-28 MED ORDER — HEPARIN SOD (PORK) LOCK FLUSH 100 UNIT/ML IV SOLN: 500.0000 [IU] | INTRAVENOUS | Status: DC | PRN

## 2021-04-28 MED ORDER — THIAMINE HCL 100 MG PO TABS: 100.0000 mg | ORAL_TABLET | Freq: Every day | ORAL | Status: DC

## 2021-04-28 MED ORDER — FOLIC ACID 1 MG PO TABS: 1.0000 mg | ORAL_TABLET | Freq: Every day | ORAL | Status: DC

## 2021-04-28 MED ORDER — TAMSULOSIN HCL 0.4 MG PO CAPS: 0.4000 mg | ORAL_CAPSULE | Freq: Every day | ORAL | Status: AC

## 2021-04-28 MED ORDER — POLYETHYLENE GLYCOL 3350 17 G PO PACK: 17.0000 g | PACK | Freq: Every day | ORAL | 0 refills | Status: DC

## 2021-04-28 NOTE — TOC Transition Note (Signed)
Transition of Care [facility]) - CM/SW Discharge Note   Patient Details  Name: Yvette Roberts MRN: [id number] Date of Birth: 30-Mar-1956  [facility] CM/SW Contact:  Ross Ludwig, LCSW Phone Number: 04/28/2021, 11:28 AM   Clinical Narrative:     Patient to be d/c'ed today to [facility] room 126.  Patient and family agreeable to plans will transport via ems RN to call report to [phone].  CSW notified patient's son [name] that she is discharging today.  Final next level of care: Skilled Nursing Facility Barriers to Discharge: Barriers Resolved   Patient Goals and CMS Choice Patient states their goals for this hospitalization and ongoing recovery are:: To go to SNF for short term rehab, then return back home. CMS Medicare.gov Compare Post Acute Care list provided to:: Patient Represenative (must comment) Choice offered to / list presented to : Adult Children  Discharge Placement Patient going to SNF for short term rehab, plan to return back home after SNF placement.   Existing PASRR number confirmed : 04/27/21          Patient chooses bed at: [facility] Patient to be transferred to facility by: [facility] Name of family member notified: Patient's son [name] Patient and family notified of of transfer: 04/28/21  Discharge Plan and Services   Discharge Planning Services: CM Consult                                 Social Determinants of Health ([place]) Interventions     Readmission Risk Interventions Readmission Risk Prevention Plan 04/05/2021  Transportation Screening Complete  Medication Review Press photographer) Complete  PCP or Specialist appointment within 3-5 days of discharge Complete  HRI or Home Care Consult Complete  SW Recovery Care/Counseling Consult Complete  [place] Complete  Some recent data might be hidden

## 2021-04-28 NOTE — Discharge Summary (Addendum)
Physician Discharge Summary   Patient ID: Yvette Roberts MRN: [id number] DOB/AGE: November 08, 1955 65 y.o.  Admit date: 03/27/2021 Discharge date: 04/28/2021  Primary Care Physician:  [name], FNP   Recommendations for Outpatient Follow-up:  Follow up with PCP in 1-2 weeks Needs palliative medicine to follow at the skilled nursing facility Outpatient follow-up with [facility] neurolog[facility], Dr. Narda Amber.  Referral has been sent.  Please call for an appointment. Continue Foley catheter, outpatient voiding trial in 3 days.  Recommend urology follow-up if patient has recurrence of urinary retention.  Home Health: Needs PT OT Equipment/Devices:   Discharge Condition: stable  CODE STATUS: FULL  Diet recommendation: Heart healthy diet   Discharge Diagnoses:     Bilateral lower extremity weakness, unclear etiology, possibly CIDP versus nutritional deficiency Febrile neutropenia with Pseudomonas UTI Esophagitis with odynophagia Urinary retention  Hypertension  Protein-calorie malnutrition, severe (HCC)  Uterine cancer (HCC)  Malignant pleural effusion  Diabetes mellitus type 2, NIDDM Severe protein calorie malnutrition Pressure injury, posterior, medial sacrum stage II   Consults:   Neurology Infectious disease Palliative medicine   Allergies:   Allergies  Allergen Reactions   Eggs Or Egg-Derived Products Hives   Influenza Vaccines Hives   Peanut-Containing Drug Products Hives   Penicillins Hives   Sulfa Antibiotics Hives   Codeine Rash     DISCHARGE MEDICATIONS: Allergies as of 04/28/2021       Reactions   Eggs Or Egg-derived Products Hives   Influenza Vaccines Hives   Peanut-containing Drug Products Hives   Penicillins Hives   Sulfa Antibiotics Hives   Codeine Rash        Medication List     STOP taking these medications    amLODipine 5 MG tablet Commonly known as: NORVASC   famotidine 20 MG tablet Commonly known as: PEPCID   famotidine  40 MG/5ML suspension Commonly known as: PEPCID   HYDROmorphone 4 MG tablet Commonly known as: DILAUDID   lidocaine 5 % Commonly known as: LIDODERM   lidocaine-prilocaine cream Commonly known as: EMLA   omeprazole 40 MG capsule Commonly known as: PRILOSEC       TAKE these medications    carvedilol 12.5 MG tablet Commonly known as: COREG Take 12.5 mg by mouth 2 (two) times daily with a meal.   dexamethasone 4 MG tablet Commonly known as: DECADRON TAKE 2 TABLETS BY MOUTH NIGHT BEFORE AND 2 TABLETS IN THE MORNING OF CHEMO EVERY 3 WEEKS FOR 6 CYCLES   FLUoxetine 20 MG/5ML solution Commonly known as: PROZAC Take 5 mLs (20 mg total) by mouth daily. What changed:  how much to take how to take this when to take this   folic acid 1 MG tablet Commonly known as: FOLVITE Take 1 tablet (1 mg total) by mouth daily.   gabapentin 100 MG capsule Commonly known as: NEURONTIN Take 2 capsules (200 mg total) by mouth 2 (two) times daily.   Gerhardt's butt cream Crea Apply 1 application topically 3 (three) times daily.   magnesium oxide 400 (240 Mg) MG tablet Commonly known as: MAG-OX Take 2 tablets (800 mg total) by mouth daily.   mirtazapine 15 MG disintegrating tablet Commonly known as: REMERON SOL-TAB Dissolve  1 tablet (15 mg total) in mouth at bedtime.   multivitamin with minerals Tabs tablet Take 1 tablet by mouth daily.   ondansetron 8 MG tablet Commonly known as: ZOFRAN TAKE 1 TABLET BY MOUTH EVERY 8 HOURS AS NEEDED START ON THE THIRD DAY AFTER CHEMO  What changed:  how much to take how to take this when to take this reasons to take this   pantoprazole sodium 40 mg/20 mL Pack Commonly known as: PROTONIX Take 20 mLs (40 mg total) by mouth 2 (two) times daily.   polyethylene glycol 17 g packet Commonly known as: MIRALAX / GLYCOLAX Take 17 g by mouth daily.   potassium chloride SA 20 MEQ tablet Commonly known as: KLOR-CON Take 2 tablets (40 mEq total) by  mouth daily.   prochlorperazine 10 MG tablet Commonly known as: COMPAZINE TAKE 1 TABLET BY MOUTH EVERY 6 HOURS AS NEEDED FOR NAUSEA OR VOMITING What changed:  how much to take reasons to take this   sucralfate 1 g tablet Commonly known as: CARAFATE Take 1 tablet (1 g total) by mouth 4 (four) times daily -  with meals and at bedtime.   tamsulosin 0.4 MG Caps capsule Commonly known as: Flomax Take 1 capsule (0.4 mg total) by mouth daily for 7 days.   thiamine 100 MG tablet Take 1 tablet (100 mg total) by mouth daily.   Vitamin D 125 MCG (5000 UT) Caps Take 5,000 Units by mouth daily.               Discharge Care Instructions  (From admission, onward)           Start     Ordered   04/28/21 0000  Discharge wound care:       Comments: Silicone foam dressings to the gluteal cleft and buttocks change every 3 days. Assess under dressings each shift for any acute changes in the wounds.  Frequent turns.   04/28/21 0847   04/11/21 0000  Discharge wound care:       Comments: Silicone foam dressings to the gluteal cleft and buttocks change every 3 days. Assess under dressings each shift for any acute changes in the wounds.   04/11/21 1210   04/10/21 0000  Discharge wound care:       Comments: Silicone foam dressing to the gluteal cleft and buttocks   04/10/21 1058             Brief H and P: For complete details please refer to admission H and P, but in brief 65 year old African-American female with past medical history of metastatic uterine cancer, diabetes mellitus type 2, hypertension, malignant pleural effusion who was admitted with odynophagia and weight loss.  She reported 50 pound weight loss in past 3 months.  Also underwent further work-up had a EGD and found to have nonsevere esophagitis in proximal and distal esophagus as well as scattered mild inflammation with erythema in the entire stomach.  She was started on Protonix and sucralfate.  Hospitalization was  complicated by febrile neutropenia and Pseudomonas UTI.    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 -Completed IVIG on 6/20-6/24 for 5 days with no improvement in the weakness -Status post LP x2, oligoclonal bands 0.,  Unremarkable.  CSF cultures negative. -HSV type I/II antibody IgG CSF were elevated to 2.54.  Neurology and ID recommended repeat LP for PCR. -LP repeated on 6/26, patient was  started on empiric acyclovir to be discontinued if PCR negative.  Acyclovir discontinued on 6/28 -Vit B6 level is still pending. Folate 4.4, was placed on supplementation, B6 is still pending -Serum protein electrophoresis did not show any M spike, has nonspecific polyclonal increase in gamma globulin (can be seen in autoimmune disease states) -Per neurology, presentation likely due to nutritional deficiency versus potential CIDP, recommended outpatient follow-up with Dr. Narda Amber with [facility] neurolog[facility].  May need outpatient EMG/NCS -Patient was seen by palliative medicine, recommended palliative medicine to follow at the skilled nursing 35      Odynophagia, esophagitis  -EGD 6/2 showed mild nonsevere esophagitis in the proximal and distal esophagus, scattered mild inflammation with erythema in the entire stomach -Biopsy showed reactive gastropathy, negative H. pylori.  No metaplasia or dysplasia or malignancy -Recommended outpatient follow-up with GI -Currently no acute issues, continue Protonix twice  daily, Carafate 1 g p.o. 3 times daily per GI recommendations   Febrile neutropenia, Pseudomonas UTI, urinary retention -Patient has completed 7-day course of cefepime, discontinued on 6/11 -Afebrile -Foley catheter was placed for acute urinary retention x2 episodes.  Recommend voiding trial at SNF in 3 days and outpatient follow-up with urology if has retention again.  Continue tamsulosin daily for 7 days.   Hypokalemia -On daily replacement, will recheck BMET in a.m.     Pancytopenia -H&H stable, hemoglobin 8.7   Metastatic uterine cancer -Followed by oncology, currently holding off chemotherapy -Continue outpatient follow-up with oncology.  Palliative medicine to follow at the facility.    Essential hypertension -Stable, continue Coreg   Diabetes mellitus type 2, NIDDM, appears to be diet controlled -CBGs currently stable, hemoglobin A1c 6.1 -Outpatient follow-up with PCP   Malignant pleural effusion -CT scan showed no PE, loculated right pleural effusion is noted, not appreciably changed since the prior PET scan -Currently no hypoxia or respiratory distress, outpatient follow-up with oncology   Pressure injury Posterior sacrum, medial lower stage II -Wound care per nursing   Severe protein calorie malnutrition secondary to poor p.o. intake and malignancy -Continue nutritional supplements Estimated body mass index is 26.87 kg/m as calculated from the following:   Height as of this encounter: 5\' 6"  (1.676 m).   Weight as of this encounter: 75.5 kg.   Day of Discharge S: No acute complaints.  No fevers or chills, no significant improvement in the lower extremity weakness.  Overnight no acute acute issues.  BP (!) 140/105   Pulse 89   Temp 98 F (36.7 C) (Oral)   Resp 14   Ht 5\' 6"  (1.676 m)   Wt 75.5 kg   SpO2 100%   BMI 26.87 kg/m   Physical Exam: General: Alert and awake oriented x to self, NAD CVS: S1-S2 clear no murmur rubs or gallops Chest: clear to  auscultation bilaterally, no wheezing rales or rhonchi Abdomen: soft nontender, nondistended, normal bowel sounds Extremities: no cyanosis, clubbing or edema noted bilaterally Neuro: 1/5 strength in lower extremities    Get Medicines reviewed and adjusted: Please take all your medications with you for your next visit with your Primary MD  Please request your Primary MD to go over all hospital tests and procedure/radiological results at the follow up. Please ask your Primary MD to get all Hospital records sent to his/her office.  If you experience worsening of your admission symptoms, develop shortness of breath, life threatening emergency, suicidal or homicidal thoughts you must seek medical attention immediately by calling 911 or calling your MD immediately  if symptoms less severe.  You must read complete instructions/literature along with all the possible adverse reactions/side effects for all the Medicines you take and that have been prescribed to you. Take any new Medicines after you have completely understood and accept all the possible adverse reactions/side effects.   Do not drive when taking pain medications.   Do not take more than prescribed Pain, Sleep and Anxiety Medications  Special Instructions: If you have smoked or chewed Tobacco  in the last 2 yrs please stop smoking, stop any regular Alcohol  and or any Recreational drug use.  Wear Seat belts while driving.  Please note  You were cared for by a hospitalist during your hospital stay. Once you are discharged, your primary care physician will handle any further medical issues. Please note that NO REFILLS for any discharge medications will be authorized once you are discharged, as it is imperative that you return to your primary care physician (or establish a relationship with a primary care physician if you do not have one) for your aftercare needs so that they can reassess your need for medications and monitor your lab  values.   The results of significant diagnostics from this hospitalization (including imaging, microbiology, ancillary and laboratory) are listed below for reference.      Procedures/Studies:  MR BRAIN W WO CONTRAST  Result Date: 04/13/2021 CLINICAL DATA:  bilateral lower extremity weakness. EXAM: MRI HEAD WITHOUT AND WITH CONTRAST TECHNIQUE: Multiplanar, multiecho pulse sequences of the brain and surrounding structures were obtained without and with intravenous contrast. CONTRAST:  7.60mL GADAVIST GADOBUTROL 1 MMOL/ML IV SOLN COMPARISON:  None. FINDINGS: Brain: No acute infarct, mass effect or extra-axial collection. No acute or chronic hemorrhage. There is multifocal hyperintense T2-weighted signal within the white matter. Generalized volume loss without a clear lobar predilection. Hyperintense T2-weighted signal within the brainstem is likely due to chronic small vessel disease. The midline structures are normal. There is no abnormal contrast enhancement. Vascular: Major flow voids are preserved. Skull and upper cervical spine: Normal calvarium and skull base. Visualized upper cervical spine and soft tissues are normal. Sinuses/Orbits:No paranasal sinus fluid levels or advanced mucosal thickening. No mastoid or middle ear effusion. Normal orbits. IMPRESSION: 1. No acute intracranial abnormality. 2. Chronic small vessel disease and generalized volume loss. Electronically Signed   By: Ulyses Jarred M.D.   On: 04/13/2021 23:43   MR CERVICAL SPINE W WO CONTRAST  Result Date: 04/13/2021 CLINICAL DATA:  Bilateral lower extremity weakness EXAM: MRI CERVICAL AND THORACIC SPINE WITHOUT AND WITH CONTRAST TECHNIQUE: Multiplanar and multiecho pulse sequences of the cervical spine, to include the craniocervical junction and cervicothoracic junction, and the thoracic spine, were obtained without and with intravenous contrast. CONTRAST:  7.52mL GADAVIST GADOBUTROL 1 MMOL/ML IV SOLN COMPARISON:  None. FINDINGS: MRI  CERVICAL SPINE FINDINGS Alignment: Physiologic. Vertebrae: No fracture, evidence of discitis, or bone lesion. Cord: Normal signal and morphology. No abnormal contrast enhancement. Posterior Fossa, vertebral arteries, paraspinal tissues: Negative. Disc levels: C1-2: Unremarkable. C2-3: Normal disc space and facet joints. There is no spinal canal stenosis. No neural foraminal stenosis. C3-4: Small right asymmetric disc bulge with uncovertebral hypertrophy. There is no spinal canal stenosis. Mild right neural foraminal stenosis. C4-5: Small disc bulge. There is no spinal canal stenosis. No neural foraminal stenosis. C5-6: Normal disc space and facet joints. There is no spinal canal stenosis. No neural foraminal stenosis. C6-7: Normal disc space and facet joints. There is no spinal canal stenosis. No neural foraminal stenosis. C7-T1:  Normal disc space and facet joints. There is no spinal canal stenosis. No neural foraminal stenosis. MRI THORACIC SPINE FINDINGS Alignment:  Physiologic. Vertebrae: No fracture, evidence of discitis, or bone lesion. Cord: Normal signal and morphology. No abnormal contrast enhancement. Paraspinal and other soft tissues: Loculated fluid collection in the posterior right mid chest. Disc levels: T4-5: Small central disc protrusion without stenosis. T5-6: Small central disc protrusion without stenosis. T6-7: Small central disc protrusion effacing the ventral thecal sac. No spinal canal stenosis. T7-8: Minimal disc bulge without stenosis. T10-11: Small right subarticular disc protrusion with mild right foraminal narrowing. No spinal canal stenosis. The other thoracic levels are unremarkable. IMPRESSION: 1. No acute abnormality of the cervical or thoracic spine. 2. Mild cervical degenerative disc disease without spinal canal stenosis. 3. Mild right C3-4 and right T10-11 neural foraminal stenosis. 4. Loculated fluid collection in the posterior right mid chest, as previously seen on PET-CT.  Electronically Signed   By: Ulyses Jarred M.D.   On: 04/13/2021 23:54   MR THORACIC SPINE W WO CONTRAST  Result Date: 04/13/2021 CLINICAL DATA:  Bilateral lower extremity weakness EXAM: MRI CERVICAL AND THORACIC SPINE WITHOUT AND WITH CONTRAST TECHNIQUE: Multiplanar and multiecho pulse sequences of the cervical spine, to include the craniocervical junction and cervicothoracic junction, and the thoracic spine, were obtained without and with intravenous contrast. CONTRAST:  7.18mL GADAVIST GADOBUTROL 1 MMOL/ML IV SOLN COMPARISON:  None. FINDINGS: MRI CERVICAL SPINE FINDINGS Alignment: Physiologic. Vertebrae: No fracture, evidence of discitis, or bone lesion. Cord: Normal signal and morphology. No abnormal contrast enhancement. Posterior Fossa, vertebral arteries, paraspinal tissues: Negative. Disc levels: C1-2: Unremarkable. C2-3: Normal disc space and facet joints. There is no spinal canal stenosis. No neural foraminal stenosis. C3-4: Small right asymmetric disc bulge with uncovertebral hypertrophy. There is no spinal canal stenosis. Mild right neural foraminal stenosis. C4-5: Small disc bulge. There is no spinal canal stenosis. No neural foraminal stenosis. C5-6: Normal disc space and facet joints. There is no spinal canal stenosis. No neural foraminal stenosis. C6-7: Normal disc space and facet joints. There is no spinal canal stenosis. No neural foraminal stenosis. C7-T1: Normal disc space and facet joints. There is no spinal canal stenosis. No neural foraminal stenosis. MRI THORACIC SPINE FINDINGS Alignment:  Physiologic. Vertebrae: No fracture, evidence of discitis, or bone lesion. Cord: Normal signal and morphology. No abnormal contrast enhancement. Paraspinal and other soft tissues: Loculated fluid collection in the posterior right mid chest. Disc levels: T4-5: Small central disc protrusion without stenosis. T5-6: Small central disc protrusion without stenosis. T6-7: Small central disc protrusion effacing  the ventral thecal sac. No spinal canal stenosis. T7-8: Minimal disc bulge without stenosis. T10-11: Small right subarticular disc protrusion with mild right foraminal narrowing. No spinal canal stenosis. The other thoracic levels are unremarkable. IMPRESSION: 1. No acute abnormality of the cervical or thoracic spine. 2. Mild cervical degenerative disc disease without spinal canal stenosis. 3. Mild right C3-4 and right T10-11 neural foraminal stenosis. 4. Loculated fluid collection in the posterior right mid chest, as previously seen on PET-CT. Electronically Signed   By: Ulyses Jarred M.D.   On: 04/13/2021 23:54   MR Lumbar Spine W Wo Contrast  Result Date: 04/11/2021 CLINICAL DATA:  Metastatic uterine cancer. Unable to move the lower extremities. EXAM: MRI LUMBAR SPINE WITHOUT AND WITH CONTRAST TECHNIQUE: Multiplanar and multiecho pulse sequences of the lumbar spine were obtained without and with intravenous contrast. CONTRAST:  51mL GADAVIST GADOBUTROL 1 MMOL/ML IV SOLN COMPARISON:  None. FINDINGS:  Segmentation:  5 lumbar type vertebral bodies assumed. Alignment:  No malalignment. Vertebrae: No evidence of regional metastatic disease. A small focus of edema and enhancement at the inferior endplate of W11 is favored to be discogenic. Conus medullaris and cauda equina: Conus extends to the L1 level. Conus and cauda equina appear normal. Paraspinal and other soft tissues: Enlarged uterus. Disc levels: Minimal non-compressive disc bulges from T11-12 through L1-2. L2-3: Endplate osteophytes and moderate bulging of the disc. Mild facet and ligamentous hypertrophy. Moderate canal stenosis, but without definite focal neural compression. Foraminal stenosis on the left primarily due to encroachment by facet arthropathy could affect the exiting left L2 nerve. L3-4: Endplate osteophytes and bulging of the disc. Facet and ligamentous hypertrophy. Moderate canal stenosis. Bilateral lateral recess stenosis could cause neural  compression on either or both sides. Foraminal narrowing worse on the right could possibly affect the exiting right L3 nerve. L4-5: Endplate osteophytes and bulging of the disc. Facet and ligamentous hypertrophy. Mild stenosis of both lateral recesses but without definite neural compression. L5-S1: Mild bulging of the disc. No compressive canal or foraminal stenosis. IMPRESSION: No evidence of regional metastatic disease. Small focus of edema and enhancement at the inferior endplate of B14 favored to be discogenic in nature. Even if this did represent a small bone metastasis, it would not relate to the clinical presentation. Multilevel degenerative disc disease and degenerative facet disease throughout the lumbar region as above. Some potential for neural compression as outlined at each level above, but I would not expect these abnormalities to present with an inability to move the lower extremities. Electronically Signed   By: Nelson Chimes M.D.   On: 04/11/2021 16:41   DG CHEST PORT 1 VIEW  Result Date: 04/01/2021 CLINICAL DATA:  Neutropenic fever EXAM: PORTABLE CHEST 1 VIEW COMPARISON:  March 31, 2021 FINDINGS: Stable small right-sided loculated pleural effusion. Hazy opacification of the right hemithorax with fluid in the minor fissure likely representing a small free-flowing pleural effusion. Atelectasis in the right lung base. Left lung is clear. Right chest wall Port-A-Cath with tip overlying the right atrium. The heart size and mediastinal contours are unchanged. Thoracic spondylosis with degenerative changes shoulders. IMPRESSION: Stable right-sided pleural fluid with adjacent atelectasis. Left lung is clear. Electronically Signed   By: Dahlia Bailiff MD   On: 04/01/2021 11:37   DG CHEST PORT 1 VIEW  Result Date: 03/31/2021 CLINICAL DATA:  Shortness of breath.  History of ovarian carcinoma EXAM: PORTABLE CHEST 1 VIEW COMPARISON:  Chest radiograph Mar 27, 2021; chest CT March 28, 2021 FINDINGS: Apparent  loculated pleural effusion on the right remains. There is also an apparent free-flowing small right pleural effusion, not appreciably changed from recent CT. Atelectasis right base. Left lung clear. Heart is upper normal in size with pulmonary vascularity normal. Port-A-Cath tip is in the right atrium slightly beyond the superior vena cava-right atrium junction. No adenopathy appreciable. Degenerative change in each shoulder noted. No pneumothorax. IMPRESSION: Pleural effusion on the right, primarily loculated, essentially stable compared to recent CT examination. Right base atelectasis noted. Left lung clear. No new opacity evident. Stable cardiac silhouette. Port-A-Cath present on the right. Electronically Signed   By: Lowella Grip III M.D.   On: 03/31/2021 08:12   DG FL GUIDED LUMBAR PUNCTURE  Result Date: 04/21/2021 CLINICAL DATA:  Weakness. EXAM: DIAGNOSTIC LUMBAR PUNCTURE UNDER FLUOROSCOPIC GUIDANCE COMPARISON:  Fluoroscopically guided lumbar puncture 04/16/2021. Lumbar spine MRI 04/11/2021. FLUOROSCOPY TIME:  Fluoroscopy Time:  12 seconds Radiation Exposure  Index (if provided by the fluoroscopic device): 0.7 mGy Number of Acquired Spot Images: 0 PROCEDURE: Informed consent was obtained from the patient's son prior to the procedure, including potential complications of headache, allergy, and pain. With the patient prone, the lower back was prepped with Betadine. 1% Lidocaine was used for local anesthesia. Lumbar puncture was performed at the L4-5 level using a 3.5 inch 20 gauge needle via a right interlaminar approach with return of clear CSF. 6.5 mL of CSF were obtained for laboratory studies. The patient tolerated the procedure well and there were no apparent complications. IMPRESSION: Technically successful fluoroscopically guided lumbar puncture. Electronically Signed   By: Logan Bores M.D.   On: 04/21/2021 14:41   DG FLUORO GUIDE LUMBAR PUNCTURE  Result Date: 04/16/2021 CLINICAL DATA:   Weakness. EXAM: DIAGNOSTIC LUMBAR PUNCTURE UNDER FLUOROSCOPIC GUIDANCE COMPARISON:  Lumbar spine MRI 04/11/2021. FLUOROSCOPY TIME:  Fluoroscopy Time:  24 seconds Radiation Exposure Index (if provided by the fluoroscopic device): 3 mGy Number of Acquired Spot Images: None PROCEDURE: Informed consent was obtained from the patient prior to the procedure, including a discussion of potential complications including but not limited to headache, allergy, pain, bleeding, infection, spinal cord/nerve root damage. With the patient prone, the lower back was prepped with Betadine. 1% Lidocaine was used for local anesthesia. Lumbar puncture was performed at the L3-L4 level using a 20 gauge needle with return of clear CSF. 16.5 ml of CSF were obtained for laboratory studies. The patient tolerated the procedure well without immediate postprocedure complication. IMPRESSION: Technically successful fluoroscopically guided L3-L4 lumbar puncture. 16.5 mL of CSF obtained for laboratory studies. No immediate postprocedure complication. Electronically Signed   By: Kellie Simmering DO   On: 04/16/2021 09:33      LAB RESULTS: Basic Metabolic Panel: Recent Labs  Lab 04/22/21 0500 04/23/21 0452 04/26/21 0552  NA 135 138 139  K 3.6 3.7 3.3*  CL 98 102 102  CO2 30 29 31   GLUCOSE 114* 146* 120*  BUN 14 14 22   CREATININE 0.42* 0.59 0.35*  CALCIUM 9.4 9.8 10.0  MG 1.9  --   --   PHOS 3.5  --   --    Liver Function Tests: Recent Labs  Lab 04/22/21 0500 04/26/21 0552  AST 18 19  ALT 18 15  ALKPHOS 48 41  BILITOT 0.5 0.5  PROT 8.8* 7.6  ALBUMIN 2.5* 2.6*   No results for input(s): LIPASE, AMYLASE in the last 168 hours. No results for input(s): AMMONIA in the last 168 hours. CBC: Recent Labs  Lab 04/26/21 0552  WBC 5.1  HGB 8.7*  HCT 27.5*  MCV 92.3  PLT 184   Cardiac Enzymes: No results for input(s): CKTOTAL, CKMB, CKMBINDEX, TROPONINI in the last 168 hours. BNP: Invalid input(s): POCBNP CBG: Recent Labs   Lab 04/22/21 1145 04/22/21 1623  GLUCAP 119* 151*       Disposition and Follow-up: Discharge Instructions     Ambulatory referral to Neurology   Complete by: As directed    An appointment is requested in approximately: 2-3 weeks, LE weakness of unclear etiology   Call MD for:  difficulty breathing, headache or visual disturbances   Complete by: As directed    Call MD for:  extreme fatigue   Complete by: As directed    Call MD for:  hives   Complete by: As directed    Call MD for:  persistant dizziness or light-headedness   Complete by: As directed    Call  MD for:  persistant nausea and vomiting   Complete by: As directed    Call MD for:  redness, tenderness, or signs of infection (pain, swelling, redness, odor or green/yellow discharge around incision site)   Complete by: As directed    Call MD for:  severe uncontrolled pain   Complete by: As directed    Call MD for:  temperature >100.4   Complete by: As directed    DIET SOFT   Complete by: As directed    Diet - low sodium heart healthy   Complete by: As directed    Discharge instructions   Complete by: As directed    You were seen for pain with swallowing and inflammation of the esophagus.  You had an EGD that showed evidence of esophagitis.  You've been treated with protonix twice daily and sucralfate.  Follow up with gastroenterology as an outpatient.  You had a fever with low white blood count.  You also had a UTI which was treated with cefepime.    Please follow outpatient with oncology for your metastatic uterine cancer.  Follow up for your malignant pleural effusion as well.  Please follow up with gastroenterology outpatient for your esophagitis.  You have a pressure ulcer which needs continued wound care outpatient.  Return for new, recurrent, or worsening symptoms.  Please ask your PCP to request records from this hospitalization so they know what was done and what the next steps will be.   Discharge  instructions   Complete by: As directed    You were seen for odynophagia and esophagitis (pain with swallowing and inflammation of the esophagus).  You're being treated with protonix and sucralfate.  Please follow up with gastroenterology as an outpatient.  You were treated for a UTI and fever and low white blood cell count with antibiotics.  Please follow up with Dr. Alvy Bimler regarding your metastatic uterine cancer as an outpatient.  Follow up your malignant pleural effusion.    You have a pressure ulcer which should be followed with wound care outpatient.  Return for new, recurrent, or worsening symptoms.  Please ask your PCP to request records from this hospitalization so they know what was done and what the next steps will be.   Discharge wound care:   Complete by: As directed    Silicone foam dressing to the gluteal cleft and buttocks   Discharge wound care:   Complete by: As directed    Silicone foam dressings to the gluteal cleft and buttocks change every 3 days. Assess under dressings each shift for any acute changes in the wounds.  Frequent turns.   Discharge wound care:   Complete by: As directed    Silicone foam dressings to the gluteal cleft and buttocks change every 3 days. Assess under dressings each shift for any acute changes in the wounds.   Increase activity slowly   Complete by: As directed    Increase activity slowly   Complete by: As directed    Increase activity slowly   Complete by: As directed         DISPOSITION: [place] information for follow-up providers     [name], FNP Follow up.   Specialty: Family Medicine Contact information: [address] [place] [address] [phone]         [name], MD Follow up.   Specialty: Hematology and Oncology Contact information: [place] [address]  [place], Donika K,  DO Follow up.   Specialty: Neurology Why: for hospital follow-up Contact information: [place] [address] [phone]              Contact information for after-discharge care     [facility] .   Service: Skilled Nursing Contact information: [address] [place] [phone]                      Time coordinating discharge:  35mins   Signed:   Estill Cotta M.D. [facility] 04/28/2021, 11:55 AM

## 2021-04-29 NOTE — Progress Notes (Signed)
Daily Progress Note   Patient Name: Yvette Roberts       Date: 04/29/2021 DOB: 21-Mar-1956  Age: 65 y.o. MRN#: [id number] Attending Physician: No att. providers found Primary Care Physician: Yvette Hatchet, FNP Admit Date: 03/27/2021  Reason for Consultation/Follow-up: Establishing goals of care  Subjective: I saw and examined Yvette Roberts today.  She was lying in bed in no distress.  Her granddaughter was at the bedside.  We discussed clinical course as well as options for care moving forward.  I talked with her about complex situation with neurologic pathology in addition to her cancer.  I then called and spoke with patient's sister, [name].  [name] and I discussed that the hospital can be useful as long as her sister is getting well enough from care she receives at the hospital to enjoy time outside of the hospital, but there is going to come a time in the near future where she may be better served to plan on bringing care to her rather repeated trips to the hospital. We discussed hospice as a tool that may be beneficial in this goal as she reaches the point where we are trying to fix problems that are not fixable.  She is in agreement that a good plan would be to plan to transition to rehab as they have previously been arranging.  If she does well at home and continues to thrive, I encouraged they continue with this plan. If, however she is unable to regain function and she continues to decline, I recommended that she be followed by outpatient palliative care to determine if she may be better served by focusing care on comfort with support of organization such as hospice.   Length of Stay: 32  Physical Exam        General: Alert, awake, in no acute distress.  Somewhat confused (requesting me  to send her an email of some sort). HEENT: No bruits, no goiter, no JVD Heart: Regular rate and rhythm. No murmur appreciated. Lungs: Good air movement, clear Abdomen: Soft, nontender, nondistended, positive bowel sounds.   Ext: No significant edema Skin: Warm and dry   Vital Signs: BP (!) 128/94 (BP Location: Left Arm)   Pulse 89   Temp 98.7 F (37.1 C) (Oral)   Resp 16   Ht 5\' 6"  (1.676  m)   Wt 75.5 kg   SpO2 96%   BMI 26.87 kg/m  SpO2: SpO2: 96 % O2 Device: O2 Device: Room Air O2 Flow Rate: O2 Flow Rate (L/min): 2 L/min  Intake/output summary: No intake or output data in the 24 hours ending 04/29/21 1523  LBM: Last BM Date: 04/27/21 Baseline Weight: Weight: 75.5 kg Most recent weight: Weight: 75.5 kg       Palliative Assessment/Data:    Flowsheet Rows    Flowsheet Row Most Recent Value  Intake Tab   Referral Department Hospitalist  Unit at Time of Referral Med/Surg Unit  Palliative Care Primary Diagnosis Cancer  Date Notified 04/24/21  Palliative Care Type New Palliative care  Reason for referral Clarify Goals of Care  Date of Admission 03/27/21  Date first seen by Palliative Care 04/25/21  # of days Palliative referral response time 1 Day(s)  # of days IP prior to Palliative referral 28  Clinical Assessment   Palliative Performance Scale Score 30%  Psychosocial & Spiritual Assessment   Palliative Care Outcomes   Patient/Family meeting held? No       Patient Active Problem List   Diagnosis Date Noted   Pressure injury of skin 04/12/2021   Decreased urine output    Protein-calorie malnutrition, severe ([place]) 03/27/2021   Odynophagia 03/27/2021   Insomnia 03/13/2021   Esophagitis 03/06/2021   Dysphagia 03/06/2021   Hypomagnesemia 03/01/2021   Hypokalemia 02/08/2021   Other constipation 01/30/2021   Abnormal weight loss 01/30/2021   Cancer associated pain 01/08/2021   Goals of care, counseling/discussion 01/08/2021   Malignant pleural effusion  01/03/2021   Metastasis to liver ([place]) 01/03/2021   Uterine cancer ([place]) 12/20/2020   Acute hypoxemic respiratory failure ([place]) 11/23/2020   Pleural effusion, right 11/23/2020   Atelectasis of right lung 11/23/2020   Hyponatremia 11/23/2020   Anxiety 05/09/2020   Trigger point of right shoulder region 03/09/2019   Cervical radiculopathy at C8 03/09/2019   Unilateral primary osteoarthritis, left knee 11/17/2017   Unilateral primary osteoarthritis, right knee 11/17/2017   Degenerative arthritis of knee, bilateral 07/14/2017   Baker cyst, left 06/16/2017   Pain and swelling of left lower extremity 05/26/2017   Degenerative joint disease of knee, left 05/26/2017   Hyperlipemia 06/12/2016   MDD (major depressive disorder) 06/12/2016   Urticaria 06/12/2016   Type 2 diabetes mellitus without complication ([place]) 73/41/9379   Class II obesity 07/21/2014   Hypertension 07/21/2014   Esophageal reflux 07/21/2014    Palliative Care Assessment & Plan   Patient Profile: 65 year old African-American female with past medical history of metastatic uterine cancer, diabetes, hypertension, volume pleural effusion admitted with odynophagia and weight loss.  She was found to have severe esophagitis and her hospital course been further complicated by predominantly lower extremity weakness thought to be secondary to either nutritional deficiency or CIDP.  Palliative care consulted for goals of care.  Recommendations/Plan: Plan for transition to skilled facility for trial of rehab.. Recommend outpatient palliative care follow.  Code Status:    Code Status Orders  (From admission, onward)           Start     Ordered   03/27/21 1744  Full code  Continuous        03/27/21 1743           Code Status History     Date Active Date Inactive Code Status Order ID Comments User Context   11/23/2020 0531 11/30/2020 1819 Full Code [id number]  Carson Myrtle,  Ricard Dillon, MD ED      Advance Directive  Documentation    [place] Most Recent Value  Type of Advance Directive Living will  Pre-existing out of facility DNR order (yellow form or pink MOST form) --  "MOST" Form in Place? --       Prognosis:  < 6 months  Discharge Planning: To Be Determined  Care plan was discussed with sister, granddaughter  Thank you for allowing the Palliative Medicine Team to assist in the care of this patient.   Time In: 1230 Time Out: 1320 Total Time 50 Prolonged Time Billed No  Greater than 50%  of this time was spent counseling and coordinating care related to the above assessment and plan.   Micheline Rough, MD  Please contact Palliative Medicine Team phone at [phone] for questions and concerns.

## 2021-05-03 ENCOUNTER — Ambulatory Visit: Payer: Medicare Other

## 2021-05-03 ENCOUNTER — Other Ambulatory Visit: Payer: Medicare Other

## 2021-05-03 ENCOUNTER — Ambulatory Visit: Payer: Medicare Other | Admitting: Hematology and Oncology

## 2021-05-10 ENCOUNTER — Other Ambulatory Visit (HOSPITAL_BASED_OUTPATIENT_CLINIC_OR_DEPARTMENT_OTHER): Payer: Self-pay

## 2021-05-11 ENCOUNTER — Other Ambulatory Visit: Payer: Self-pay

## 2021-05-11 ENCOUNTER — Non-Acute Institutional Stay: Payer: Medicare Other | Admitting: Hospice

## 2021-05-11 ENCOUNTER — Encounter: Payer: Self-pay | Admitting: Hematology and Oncology

## 2021-05-11 DIAGNOSIS — C540 Malignant neoplasm of isthmus uteri: Secondary | ICD-10-CM

## 2021-05-11 DIAGNOSIS — Z515 Encounter for palliative care: Secondary | ICD-10-CM

## 2021-05-11 DIAGNOSIS — K209 Esophagitis, unspecified without bleeding: Secondary | ICD-10-CM

## 2021-05-11 DIAGNOSIS — E43 Unspecified severe protein-calorie malnutrition: Secondary | ICD-10-CM

## 2021-05-11 LAB — MISC LABCORP TEST (SEND OUT): Labcorp test code: 9985

## 2021-05-11 NOTE — Progress Notes (Addendum)
[facility] Consult Note Telephone: [phone]  Fax: [phone]  Roberts NAME: Yvette Roberts [address] [place] [address] [phone] (home)  DOB: Oct 09, 1956 MRN: [id number]  PRIMARY CARE PROVIDER:    [place], Yvette Roberts, Yvette Roberts,  [address] [place] [address] [phone]  REFERRING PROVIDER:   Vincenza, Yvette Roberts, [place] [address] [place],  [place] [address] [phone]  RESPONSIBLE PARTY:  Morene Crocker Emergency contact: [facility] Information     Name Relation Home Work [address]   Yvette, Roberts Relative [phone]  [phone]   Yvette Roberts, [name] [phone]  [phone]   [place],Yvette Roberts Brother [phone]  [phone]   Yvette Roberts Granddaughter [phone]  [phone]        I met face to face with Roberts at facility. Palliative Care was asked to follow this Roberts by consultation request of  Yvette Miller, NP   to address advance care planning, complex medical decision making and goals of care clarification. NP later called and updated Yvette Roberts on visit. She endorsed palliative service and provided additional history. This is the initial visit.    ASSESSMENT AND / RECOMMENDATIONS:   Advance Care Planning: Our advance care planning conversation included a discussion about:    The value and importance of advance care planning  Difference between Hospice and Palliative care Exploration of goals of care in the event of a sudden injury or illness  Identification and preparation of a healthcare agent  Review and updating or creation of an  advance directive document . Decision not to resuscitate or to de-escalate disease focused treatments due to poor prognosis.  CODE STATUS: Extensive discussion with Roberts and later with Yvette Roberts on the ramifications and implications of code status. Roberts elects to deescalate disease focused treatments due to  poor prognosis. She is now a Do Not Resuscitate.    NP called and left messages for social worker Yvette Roberts and Yvette Roberts to update the records to reflect Roberts's wishes. Yvette Roberts said she was going to the facility this evening and will make sure Roberts's wishes are reflected in her records.   Goals of Care: Comfort measures only. Roberts does not want anymore aggressive treatments. Family is interested in hospice service  I spent  46 minutes providing this initial consultation. More than 50% of the time in this consultation was spent on counseling Roberts and coordinating communication. --------------------------------------------------------------------------------------------------------------------------------------  Symptom Management/Plan: Metastatic uterine cancer: Followed by oncology, chemo on hold at this time. Roberts said she does not want chemo/radiation. Comfort measures only. Initiate  Morphine Sulphate concentrate 5mg  every 6 hours as needed for pain, Lorazepam .5mg  every 8 hours as needed for anxiety.  Esophagitis: Continue sucralfate, Protonix, mechanical soft diet as ordered.  ST consult as needed.  Severe protein caloric malnutrition: Offer 4-6 small meals a day and offer assistance as needed to adequate ensure oral intake Weakness: PT OT is ongoing for strengthening Routine CBC BMP Follow up: Palliative care will continue to follow for complex medical decision making, advance care planning, and clarification of goals. Return 6 weeks or prn.Encouraged to call provider sooner with any concerns.   Family /Caregiver/Community Supports:   HOSPICE ELIGIBILITY/DIAGNOSIS: Yes  Chief Complaint: Initial Palliative care visit  HISTORY OF PRESENT ILLNESS:  Yvette Roberts is a 65 y.o. year old female  with multiple medical conditions including  metastatic uterine cancer which was diagnosed January 2022. Roberts is a poor historian due to not remembering details  of her health conditions;  Yvette Roberts helped with history. Metastasis to liver, lung and the breast,  Yvette Roberts said chemo resulted in esophagitis, dysphagia and fatigue impairing activities of daily living and quality of life.  Roberts reports recent chemo has not helped but has worsened her health condition.  She has decided no more chemo.  Recent hospitalization 5/31 - 04/28/2021 for weakness, esophagitis, discharged to SNF for ongoing care.  History of Severe protein caloric malnutrition: Type 2 diabetes mellitus, hypertension. History obtained from review of EMR, discussion with primary team, caregiver, family and/or Yvette Roberts.  Review and summarization of Epic records shows history from other than Roberts. Rest of 10 point [name] asked and negative.     Review of lab tests/diagnostics   Results for Yvette, Roberts (MRN [id number]) as of 05/11/2021 11:22  Ref. Range 04/26/2021 05:52  Sodium Latest Ref Range: 135 - 145 mmol/L 139  Potassium Latest Ref Range: 3.5 - 5.1 mmol/L 3.3 (L)  Chloride Latest Ref Range: 98 - 111 mmol/L 102  CO2 Latest Ref Range: 22 - 32 mmol/L 31  Glucose Latest Ref Range: 70 - 99 mg/dL 120 (H)  BUN Latest Ref Range: 8 - 23 mg/dL 22  Creatinine Latest Ref Range: 0.44 - 1.00 mg/dL 0.35 (L)  Calcium Latest Ref Range: 8.9 - 10.3 mg/dL 10.0  Anion gap Latest Ref Range: 5 - 15  6  Alkaline Phosphatase Latest Ref Range: 38 - 126 U/L 41  Albumin Latest Ref Range: 3.5 - 5.0 g/dL 2.6 (L)  AST Latest Ref Range: 15 - 41 U/L 19  ALT Latest Ref Range: 0 - 44 U/L 15  Total Protein Latest Ref Range: 6.5 - 8.1 g/dL 7.6  Total Bilirubin Latest Ref Range: 0.3 - 1.2 mg/dL 0.5  GFR, Estimated Latest Ref Range: >60 mL/min >60     [name] General: NAD EYES: denies vision changes ENMT: denies dysphagia Cardiovascular: denies chest pain/discomfort Pulmonary: denies cough, denies SOB Abdomen: endorses good appetite, denies constipation/diarrhea GU: denies dysuria, urinary frequency MSK:  endorses weakness,  no falls  reported Skin: denies rashes or wounds Neurological: denies pain, denies insomnia Psych: Endorses positive mood Heme/lymph/immuno: denies bruises, abnormal bleeding  Physical Exam: Height/Weight 5 feet 6 inches/166.4 Ibs down from 241 Ibs six months ago.  Constitutional: NAD General: Well groomed, cooperative EYES: anicteric sclera, lids intact, no discharge  ENMT: Moist mucous membrane CV: S1 S2, RRR, no LE edema Pulmonary: LCTA, no increased work of breathing, no cough, Abdomen: active BS + 4 quadrants, soft and non tender GU: no suprapubic tenderness MSK: weakness, sarcopenia, limited ROM, non ambulatory, bil foot drop Skin: warm and dry, no rashes or wounds on visible skin Neuro:  weakness, otherwise non focal Psych: non-anxious affect Hem/lymph/immuno: no widespread bruising   PAST MEDICAL HISTORY:  Active Ambulatory Problems    Diagnosis Date Noted   Type 2 diabetes mellitus without complication ([place]) 36/09/2448   Class II obesity 07/21/2014   Hypertension 07/21/2014   Esophageal reflux 07/21/2014   Hyperlipemia 06/12/2016   MDD (major depressive disorder) 06/12/2016   Urticaria 06/12/2016   Pain and swelling of left lower extremity 05/26/2017   Degenerative joint disease of knee, left 05/26/2017   Baker cyst, left 06/16/2017   Degenerative arthritis of knee, bilateral 07/14/2017   Unilateral primary osteoarthritis, left knee 11/17/2017   Unilateral primary osteoarthritis, right knee 11/17/2017   Trigger point of right shoulder region 03/09/2019   Cervical radiculopathy at C8 03/09/2019   Acute hypoxemic respiratory failure ([place])  11/23/2020   Pleural effusion, right 11/23/2020   Atelectasis of right lung 11/23/2020   Hyponatremia 11/23/2020   Uterine cancer ([place]) 12/20/2020   Anxiety 05/09/2020   Malignant pleural effusion 01/03/2021   Metastasis to liver ([place]) 01/03/2021   Cancer associated pain 01/08/2021   Goals of care, counseling/discussion 01/08/2021    Other constipation 01/30/2021   Abnormal weight loss 01/30/2021   Hypokalemia 02/08/2021   Hypomagnesemia 03/01/2021   Esophagitis 03/06/2021   Dysphagia 03/06/2021   Insomnia 03/13/2021   Protein-calorie malnutrition, severe ([place]) 03/27/2021   Odynophagia 03/27/2021   Decreased urine output    Pressure injury of skin 04/12/2021   Resolved Ambulatory Problems    Diagnosis Date Noted   Right arm pain 07/21/2014   Depression 07/21/2014   Frozen shoulder 05/10/2015   Bilateral lower extremity edema 01/30/2021   Past Medical History:  Diagnosis Date   Allergy    Arthritis    Cancer ([place])    COPD (chronic obstructive pulmonary disease) ([place])    Obesity, unspecified 07/21/2014   Resistant hypertension 07/21/2014    SOCIAL HX:  Social History   Tobacco Use   Smoking status: Never   Smokeless tobacco: Never  Substance Use Topics   Alcohol use: Not on file     FAMILY HX:  Family History  Problem Relation Age of Onset   Breast cancer Sister        ? age of onset   Breast cancer Maternal Aunt    Breast cancer Maternal Aunt    Breast cancer Maternal Aunt       ALLERGIES:  Allergies  Allergen Reactions   Eggs Or Egg-Derived Products Hives   Influenza Vaccines Hives   Peanut-Containing Drug Products Hives   Penicillins Hives   Sulfa Antibiotics Hives   Codeine Rash      PERTINENT MEDICATIONS:  Outpatient Encounter Medications as of 05/11/2021  Medication Sig   carvedilol (COREG) 12.5 MG tablet Take 12.5 mg by mouth 2 (two) times daily with a meal.   Cholecalciferol (VITAMIN D) 125 MCG (5000 UT) CAPS Take 5,000 Units by mouth daily.   dexamethasone (DECADRON) 4 MG tablet TAKE 2 TABLETS BY MOUTH NIGHT BEFORE AND 2 TABLETS IN THE MORNING OF CHEMO EVERY 3 WEEKS FOR 6 CYCLES   FLUoxetine (PROZAC) 20 MG/5ML solution Take 5 mLs (20 mg total) by mouth daily.   folic acid (FOLVITE) 1 MG tablet Take 1 tablet (1 mg total) by mouth daily.   gabapentin (NEURONTIN) 100 MG capsule  Take 2 capsules (200 mg total) by mouth 2 (two) times daily.   magnesium oxide (MAG-OX) 400 (240 Mg) MG tablet Take 2 tablets (800 mg total) by mouth daily.   mirtazapine (REMERON SOL-TAB) 15 MG disintegrating tablet Dissolve  1 tablet (15 mg total) in mouth at bedtime.   Multiple Vitamin (MULTIVITAMIN WITH MINERALS) TABS tablet Take 1 tablet by mouth daily.   Nystatin (GERHARDT'S BUTT CREAM) CREA Apply 1 application topically 3 (three) times daily.   ondansetron (ZOFRAN) 8 MG tablet TAKE 1 TABLET BY MOUTH EVERY 8 HOURS AS NEEDED START ON THE THIRD DAY AFTER CHEMO   pantoprazole sodium (PROTONIX) 40 mg/20 mL PACK Take 20 mLs (40 mg total) by mouth 2 (two) times daily.   polyethylene glycol (MIRALAX / GLYCOLAX) 17 g packet Take 17 g by mouth daily.   potassium chloride SA (KLOR-CON) 20 MEQ tablet Take 2 tablets (40 mEq total) by mouth daily.   prochlorperazine (COMPAZINE) 10 MG tablet TAKE 1  TABLET BY MOUTH EVERY 6 HOURS AS NEEDED FOR NAUSEA OR VOMITING   sucralfate (CARAFATE) 1 g tablet Take 1 tablet (1 g total) by mouth 4 (four) times daily -  with meals and at bedtime.   thiamine 100 MG tablet Take 1 tablet (100 mg total) by mouth daily.   No facility-administered encounter medications on file as of 05/11/2021.     Thank you for the opportunity to participate in the care of Ms. Wheatley.  The palliative care team will continue to follow. Please call our office at [phone] if we can be of additional assistance.   Note: Portions of this note were generated with [employer]. Dictation errors may occur despite best attempts at proofreading.  Teodoro Spray, NP

## 2021-05-12 NOTE — Progress Notes (Addendum)
[facility] paraneoplastic panel came back negative for all tests:  Anti-ampiphysin Ab: Negative ANA: Negative ANNA-2: Negative ANNA-3: Negative CRMP-5 IgG: Negative P-Q channel Ab: Negative PCA-1: Negative PCA-2: Negative PCA-TR: Negative  Electronically signed: Dr. Kerney Elbe

## 2021-05-14 ENCOUNTER — Other Ambulatory Visit: Payer: Self-pay

## 2021-05-14 ENCOUNTER — Non-Acute Institutional Stay: Payer: Medicare Other | Admitting: Hospice

## 2021-05-14 DIAGNOSIS — Z515 Encounter for palliative care: Secondary | ICD-10-CM

## 2021-05-14 DIAGNOSIS — C540 Malignant neoplasm of isthmus uteri: Secondary | ICD-10-CM

## 2021-05-14 DIAGNOSIS — E43 Unspecified severe protein-calorie malnutrition: Secondary | ICD-10-CM

## 2021-05-14 NOTE — Progress Notes (Signed)
[facility] Consult Note Telephone: [phone]  Fax: [phone]  PATIENT NAME: Yvette Roberts DOB: 1956/05/28 MRN: [id number]  PRIMARY CARE PROVIDER:   [name], [place], [place], FNP [address] [place],  [place] [address]  REFERRING PROVIDER: Martinique Miller NP  RESPONSIBLE PARTY:  Morene Crocker Emergency contact: Traci Sermon Information     Name Relation Home Work [address]   Ailey, Wessling Relative [phone]  [phone]   [name], [name] [phone]  [phone]   Currey,Robert Brother [phone]  [phone]   [place] Granddaughter [phone]  [phone]       Visit is to build trust and highlight Palliative Medicine as specialized medical care for people living with serious illness, aimed at facilitating better quality of life through symptoms relief, assisting with advance care planning and complex medical decision making. NP called [name] and updated her on visit. This is a follow up visit.  RECOMMENDATIONS/PLAN:   Advance Care Planning/Code Status: Patient is a DO NOT RESUSCITATE.  NP signed DNR form for patient for facility records; same document uploaded to epic today.  Goals of Care: Per discussion with [name] 05/11/2021, patient is comfort measures only.  Family is interested in hospice service in the future.  Visit consisted of counseling and education dealing with the complex and emotionally intense issues of symptom management and palliative care in the setting of serious and potentially life-threatening illness. Palliative care team will continue to support patient, patient's family, and medical team.  Symptom management/Plan:  Severe protein caloric malnutrition: Nursing reports improvement in patient's intake, from 0% to up to 25%.  Continue to offer 4-6 small meals a day and offer assistance as needed to adequate ensure oral intake.  Patient on regular  diet.  Continue Ensure and Prosource plus liquid.  ST consult as needed.  Check weight weekly x3. Metastatic uterine cancer: Followed by oncology, chemo on hold at this time. Patient said she does not want chemo/radiation. Comfort measures only. Initiate Morphine Sulphate concentrate 5mg  every 6 hours as needed for pain, Lorazepam .5mg  every 8 hours as needed for anxiety.  Esophagitis: Continue sucralfate, Protonix.. Weakness: PT OT is ongoing for strengthening Follow up: Palliative care will continue to follow for complex medical decision making, advance care planning, and clarification of goals. Return 6 weeks or prn. Encouraged to call provider sooner with any concerns.  CHIEF COMPLAINT: Palliative follow up  HISTORY OF PRESENT ILLNESS:  Yvette Roberts a 65 y.o. female with multiple medical problems including Severe protein caloric malnutrition in the context of metastatic uterine cancer; metastases to liver and lung and breast.  History of Type 2 diabetes mellitus, hypertensionHistory obtained from review of EMR, discussion with primary team, family and/or patient. Records reviewed and summarized above. All 10 point systems reviewed and are negative except as documented in history of present illness above  Review and summarization of Epic records shows history from other than patient.   Palliative Care was asked to follow this patient o help address complex decision making in the context of advance care planning and goals of care clarification.   PHYSICAL EXAM  Height/Weight 5 feet 6 inches/166.4 Ibs down from 241 Ibs six months ago.  Constitutional: NAD General: Well groomed, cooperative EYES: anicteric sclera, lids intact, no discharge ENMT: Moist mucous membrane CV: S1 S2, RRR, no LE edema Pulmonary: LCTA, no increased work of breathing, no cough, Abdomen: active BS + 4 quadrants, soft and non tender  GU: no suprapubic tenderness MSK: weakness, sarcopenia, limited ROM, non  ambulatory Skin: warm and dry, no rashes or wounds on visible skin Neuro:  weakness, otherwise non focal Psych: non-anxious affect Hem/lymph/immuno: no widespread bruising  PERTINENT MEDICATIONS:  Outpatient Encounter Medications as of 05/14/2021  Medication Sig   carvedilol (COREG) 12.5 MG tablet Take 12.5 mg by mouth 2 (two) times daily with a meal.   Cholecalciferol (VITAMIN D) 125 MCG (5000 UT) CAPS Take 5,000 Units by mouth daily.   dexamethasone (DECADRON) 4 MG tablet TAKE 2 TABLETS BY MOUTH NIGHT BEFORE AND 2 TABLETS IN THE MORNING OF CHEMO EVERY 3 WEEKS FOR 6 CYCLES   FLUoxetine (PROZAC) 20 MG/5ML solution Take 5 mLs (20 mg total) by mouth daily.   folic acid (FOLVITE) 1 MG tablet Take 1 tablet (1 mg total) by mouth daily.   gabapentin (NEURONTIN) 100 MG capsule Take 2 capsules (200 mg total) by mouth 2 (two) times daily.   magnesium oxide (MAG-OX) 400 (240 Mg) MG tablet Take 2 tablets (800 mg total) by mouth daily.   mirtazapine (REMERON SOL-TAB) 15 MG disintegrating tablet Dissolve  1 tablet (15 mg total) in mouth at bedtime.   Multiple Vitamin (MULTIVITAMIN WITH MINERALS) TABS tablet Take 1 tablet by mouth daily.   Nystatin (GERHARDT'S BUTT CREAM) CREA Apply 1 application topically 3 (three) times daily.   ondansetron (ZOFRAN) 8 MG tablet TAKE 1 TABLET BY MOUTH EVERY 8 HOURS AS NEEDED START ON THE THIRD DAY AFTER CHEMO   pantoprazole sodium (PROTONIX) 40 mg/20 mL PACK Take 20 mLs (40 mg total) by mouth 2 (two) times daily.   polyethylene glycol (MIRALAX / GLYCOLAX) 17 g packet Take 17 g by mouth daily.   potassium chloride SA (KLOR-CON) 20 MEQ tablet Take 2 tablets (40 mEq total) by mouth daily.   prochlorperazine (COMPAZINE) 10 MG tablet TAKE 1 TABLET BY MOUTH EVERY 6 HOURS AS NEEDED FOR NAUSEA OR VOMITING   sucralfate (CARAFATE) 1 g tablet Take 1 tablet (1 g total) by mouth 4 (four) times daily -  with meals and at bedtime.   thiamine 100 MG tablet Take 1 tablet (100 mg total) by  mouth daily.   No facility-administered encounter medications on file as of 05/14/2021.    HOSPICE ELIGIBILITY/DIAGNOSIS: TBD  PAST MEDICAL HISTORY:  Past Medical History:  Diagnosis Date   Allergy    Arthritis    Cancer ([place])    COPD (chronic obstructive pulmonary disease) ([place])    Depression    Esophageal reflux 07/21/2014   Frozen shoulder 05/10/2015   Injected 05/10/2015    Obesity, unspecified 07/21/2014   Resistant hypertension 07/21/2014   Type 2 diabetes mellitus without complication ([place]) 8/84/1660     Review lab tests/diagnostics No results for input(s): WBC, HGB, HCT, PLT, MCV in the last 168 hours. No results for input(s): NA, K, CL, CO2, BUN, CREATININE, GLUCOSE in the last 168 hours. CrCl cannot be calculated (Unknown ideal weight.).  ALLERGIES:  Allergies  Allergen Reactions   Eggs Or Egg-Derived Products Hives   Influenza Vaccines Hives   Peanut-Containing Drug Products Hives   Penicillins Hives   Sulfa Antibiotics Hives   Codeine Rash      I spent 50 minutes providing this consultation; this includes time spent with patient/family, chart review and documentation. More than 50% of the time in this consultation was spent on counseling and coordinating communication   Thank you for the opportunity to participate in the care of Yvette Roberts  Please call our office at [phone] if we can be of additional assistance.  Note: Portions of this note were generated with [employer]. Dictation errors may occur despite best attempts at proofreading.  Teodoro Spray, NP

## 2021-05-30 ENCOUNTER — Telehealth: Payer: Self-pay | Admitting: Oncology

## 2021-05-30 NOTE — Telephone Encounter (Signed)
Orene Desanctis regarding request for a follow up appointment with Dr. Alvy Bimler.  She said Grisela is not able to walk, can't feed herself and is not able to answer the phone.  She understands that Lexus does not need to follow up with Dr. Alvy Bimler because she is receiving comfort care and is not strong enough to resume treatment.  She thinks the facility where Sharma is staying - [facility] probably called for the appointment.  [place] and spoke to [place].  She said she did call because the follow up was noted on Khai's hospital discharge instructions.  Advised her that Kierstyn does not need to follow up since she is on palliative/comfort care.  She verbalized understanding.

## 2021-10-28 DEATH — deceased

## 2021-11-08 ENCOUNTER — Other Ambulatory Visit: Payer: Self-pay

## 2022-02-08 ENCOUNTER — Other Ambulatory Visit: Payer: Self-pay

## 2022-04-10 ENCOUNTER — Other Ambulatory Visit: Payer: Self-pay

## 2022-04-28 IMAGING — CT CT CHEST W/O CM
2 of 4 series · 15 of 36 positions shown, 18 images · non-contrast
Comparison: None.

CLINICAL DATA: Abnormal chest x-ray, hypoxia

EXAM:
CT CHEST WITHOUT CONTRAST
TECHNIQUE: Multidetector CT imaging of the chest was performed following the
standard protocol without IV contrast.

[Series 3: chest w/o 2mm st · axial · non-contrast · 0.82mm/px · z∈[-534,-294]mm · 12 of 140 slices shown, 15 images]
[im 10/140  mediastinal]
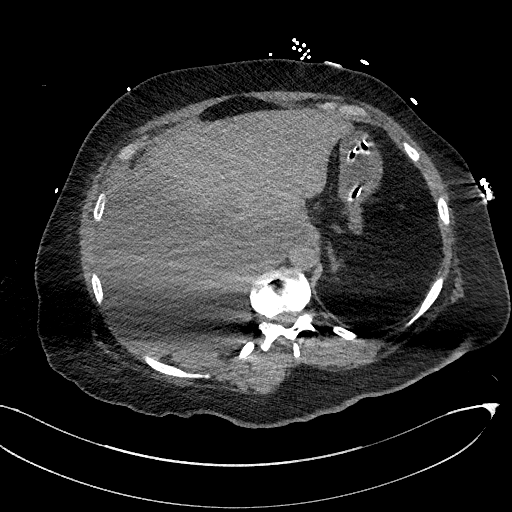
[im 10/140  lung]
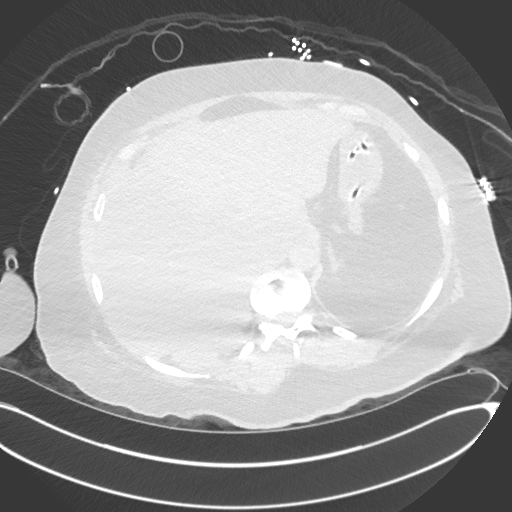
[im 19/140  lung]
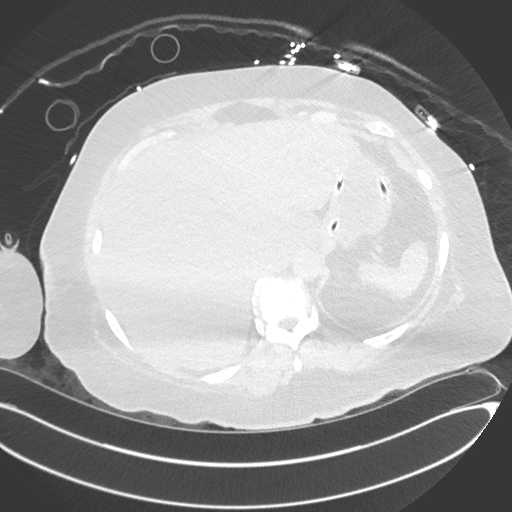
[im 28/140  lung]
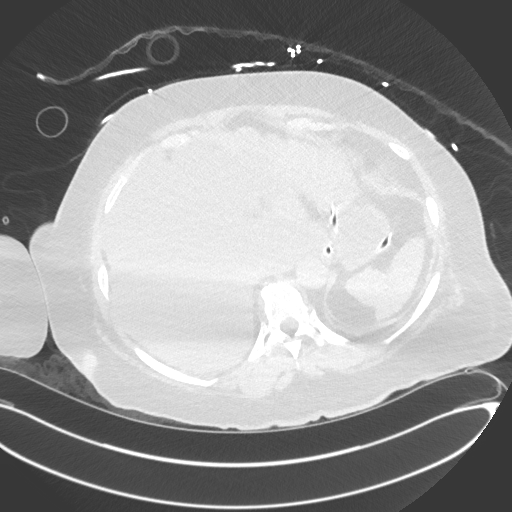
[im 47/140  lung]
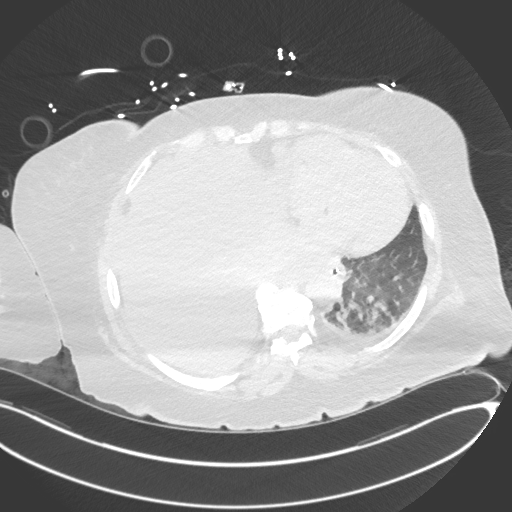
[im 56/140  mediastinal]
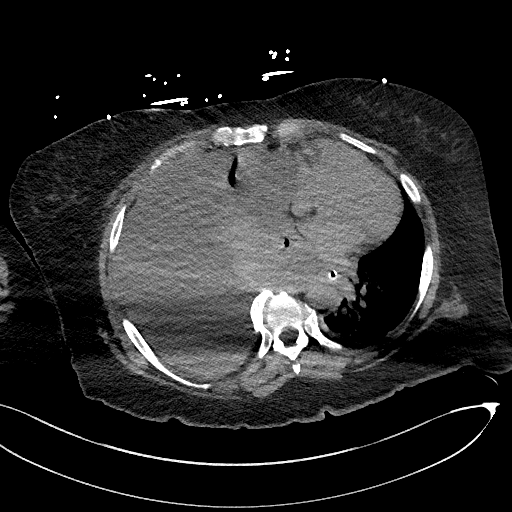
[im 56/140  lung]
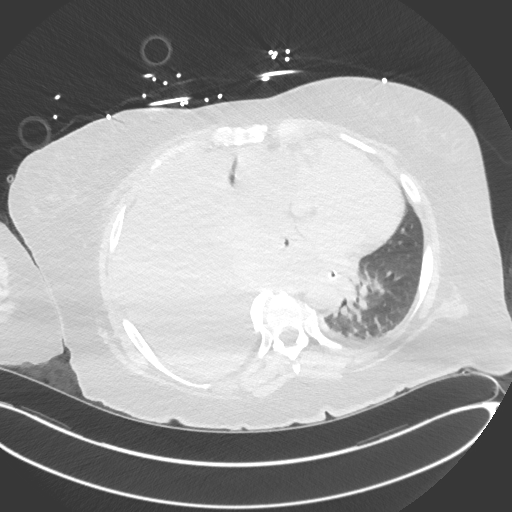
[im 65/140  lung]
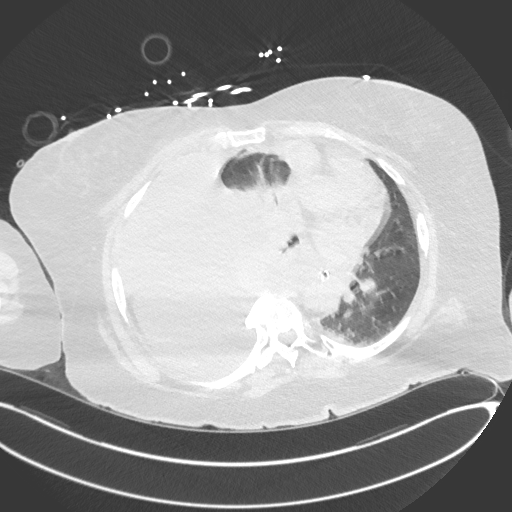
[im 75/140  lung]
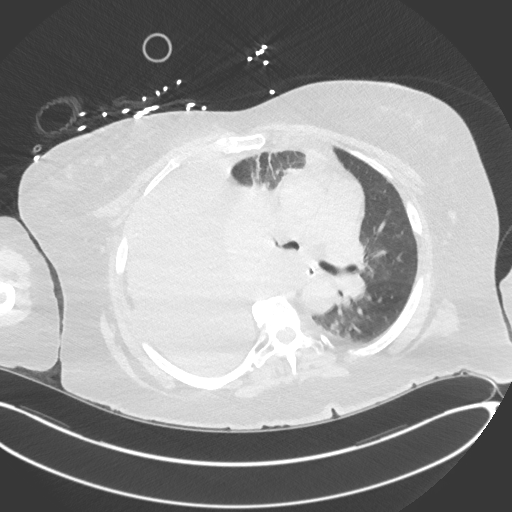
[im 84/140  lung]
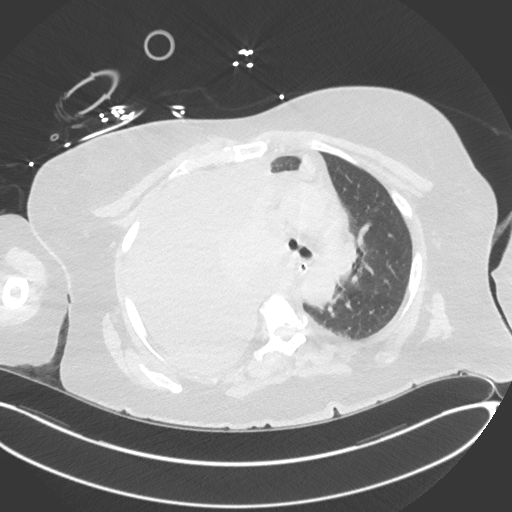
[im 93/140  mediastinal]
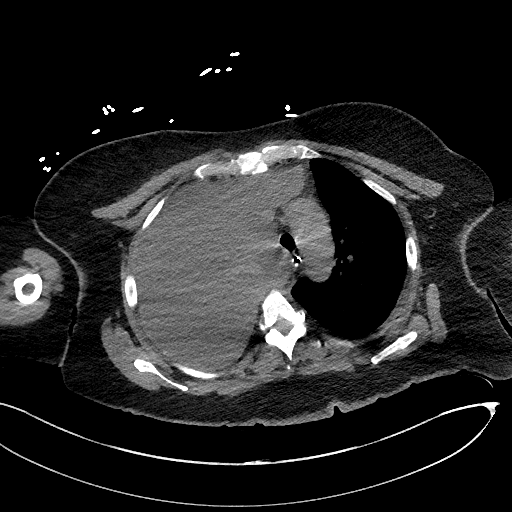
[im 93/140  lung]
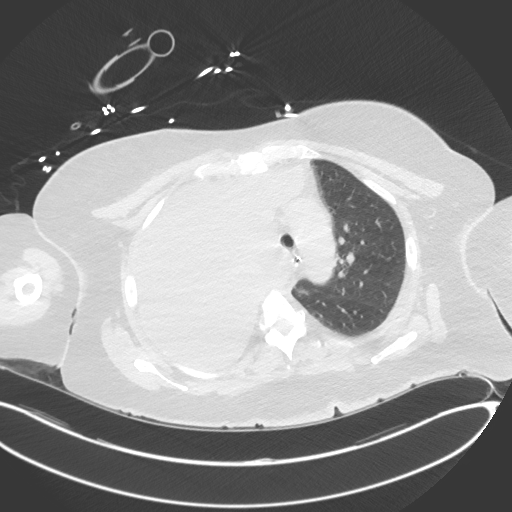
[im 112/140  lung]
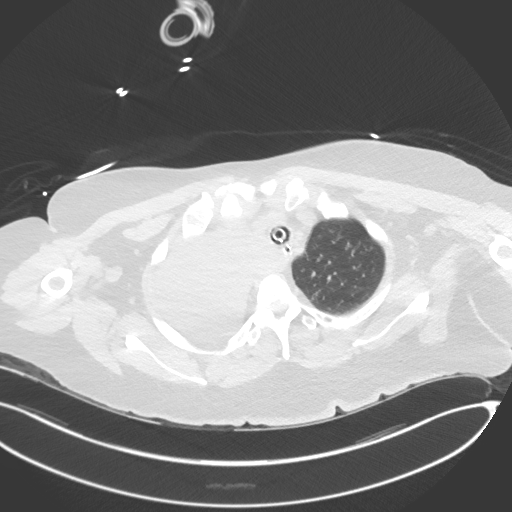
[im 121/140  lung]
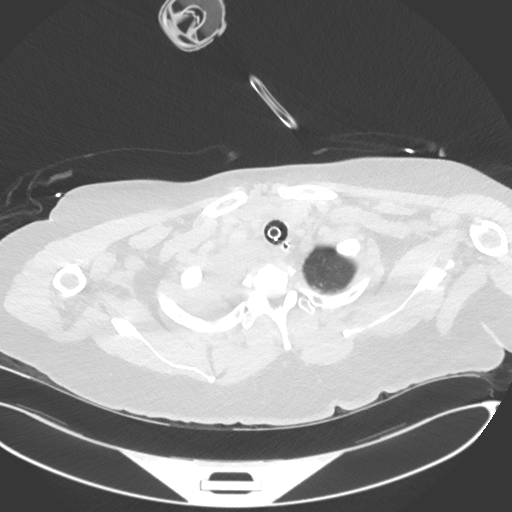
[im 130/140  lung]
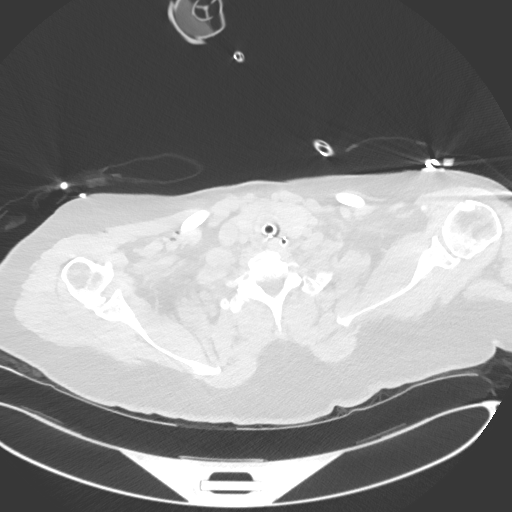

[Series 9: chest w/o 2mm st cor · coronal · non-contrast · 0.59mm/px · 3 of 151 slices shown]
[im 31/151  lung]
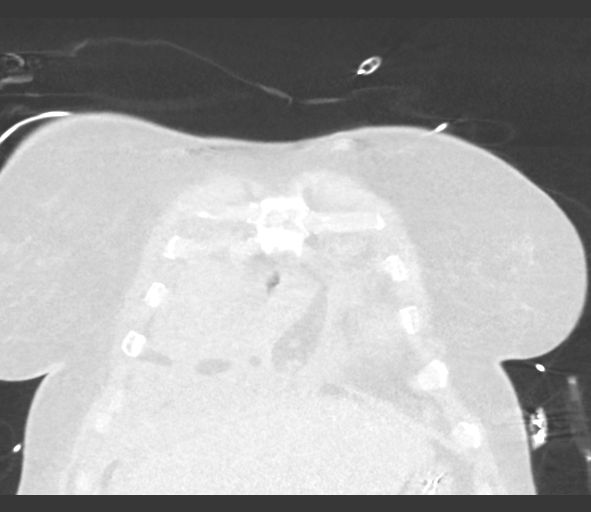
[im 61/151  lung]
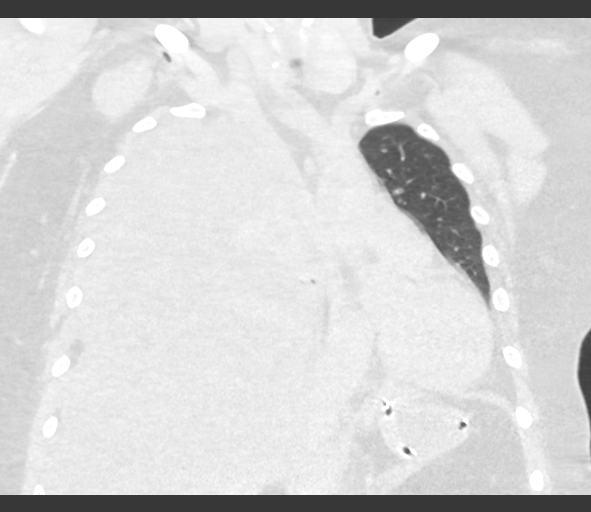
[im 91/151  lung]
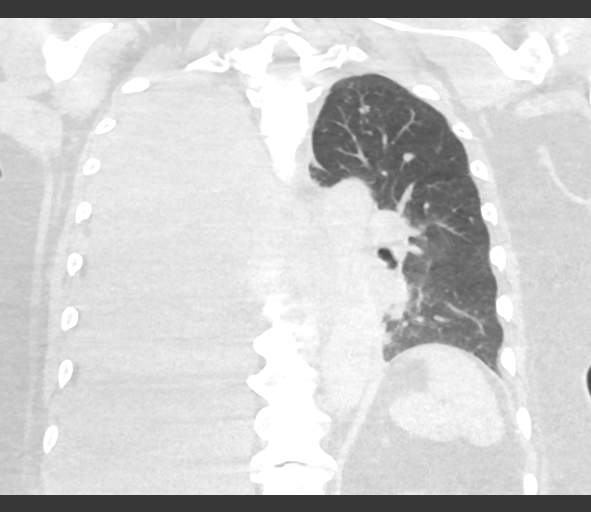

[15 of 36 positions shown; findings below may reference images not displayed]

FINDINGS: Cardiovascular: Cardiac size within normal limits. No significant
coronary artery calcification. No pericardial effusion. Central
pulmonary arteries are of normal caliber. The thoracic aorta is
unremarkable.

Mediastinum/Nodes: There is mediastinal shift to the left secondary
to the large right pleural effusion. Thyroid unremarkable. No
pathologic mediastinal adenopathy. Nasogastric tube extends into the
abdomen and is a seen looped within the visualized stomach.

Lungs/Pleura: The right hemithorax is not fully included on this
examination and the inferior pleural space is excluded. However, a
large right pleural effusion is present with near complete collapse
of the right lung. The pleural fluid is relatively low attenuation
suggesting a transudative effusion. No definite central obstructing
mass is identified though imaging of the decompressed pulmonary
parenchyma is limited in absence of contrast administration.
Endotracheal tube seen within the trachea approximately 2.8 cm above
the carina. Mild left basilar atelectasis. No pneumothorax.

Upper Abdomen: No acute abnormality.

Musculoskeletal: Osseous structures are age-appropriate. No lytic or
blastic bone lesions are identified.
IMPRESSION: Large right pleural effusion with near complete collapse of the
right lung and mediastinal shift to the left.

## 2022-04-29 IMAGING — DX DG CHEST 1V PORT
1 series · 1 of 1 positions shown · non-contrast
Comparison: 11/23/2020

CLINICAL DATA: Pleural effusion, respiratory failure

EXAM:
PORTABLE CHEST 1 VIEW

[chest ap]
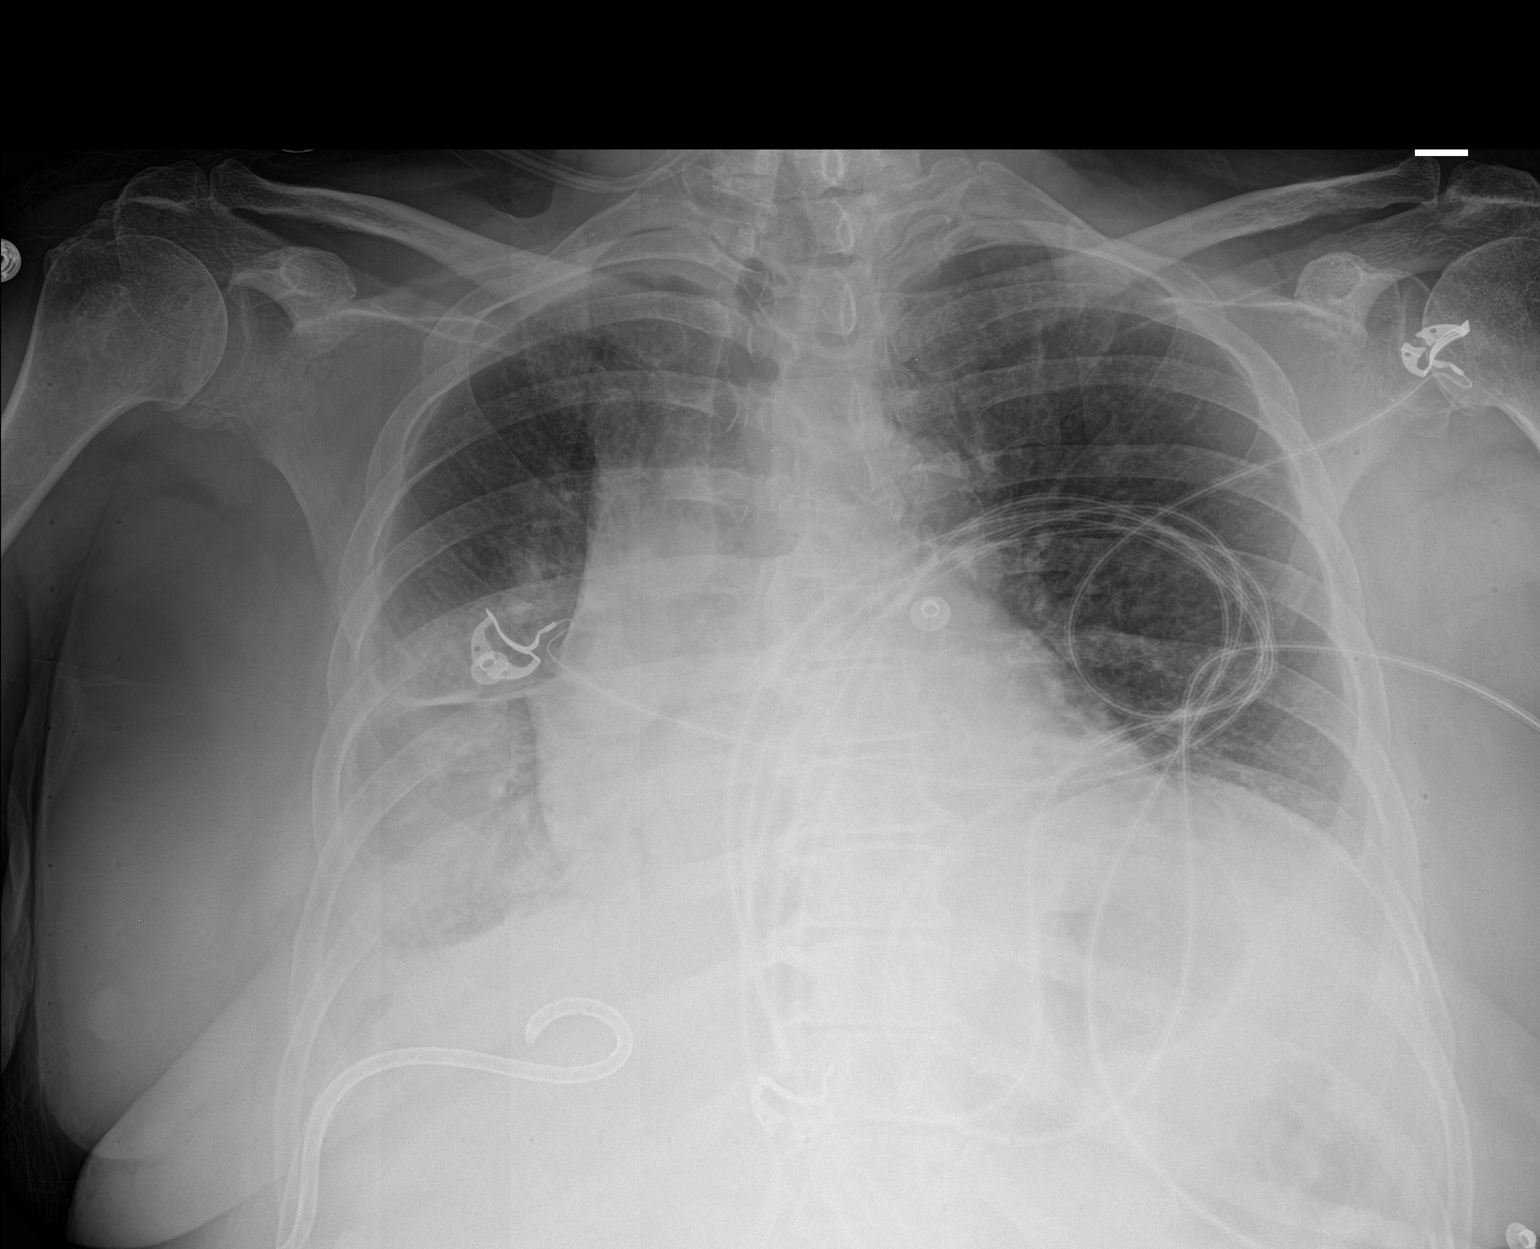

[1 of 1 positions shown; findings below may reference images not displayed]

FINDINGS: Interval extubation. Pulmonary insufflation has diminished slightly
in the interval. Right basilar pigtail chest tube is again
identified. Previously noted large right pleural effusion has been
largely evacuated though small pleural fluid persists. Right basilar
consolidation is noted. Additional airspace infiltrate within the a
right upper lobe. Mild left basilar atelectasis. No pneumothorax.
Cardiac size within normal limits.
IMPRESSION: Interval extubation with slight interval decrease in pulmonary
insufflation.

Right basilar pigtail chest tube placement with near complete
evacuation of right pleural effusion.

Persistent right basilar consolidation and right upper lobe airspace
infiltrate, likely infectious.

## 2022-04-30 IMAGING — DX DG CHEST 1V PORT
1 series · 1 of 1 positions shown · non-contrast
Comparison: 11/24/2020

CLINICAL DATA: Right pleural effusion

EXAM:
PORTABLE CHEST 1 VIEW

[chest]
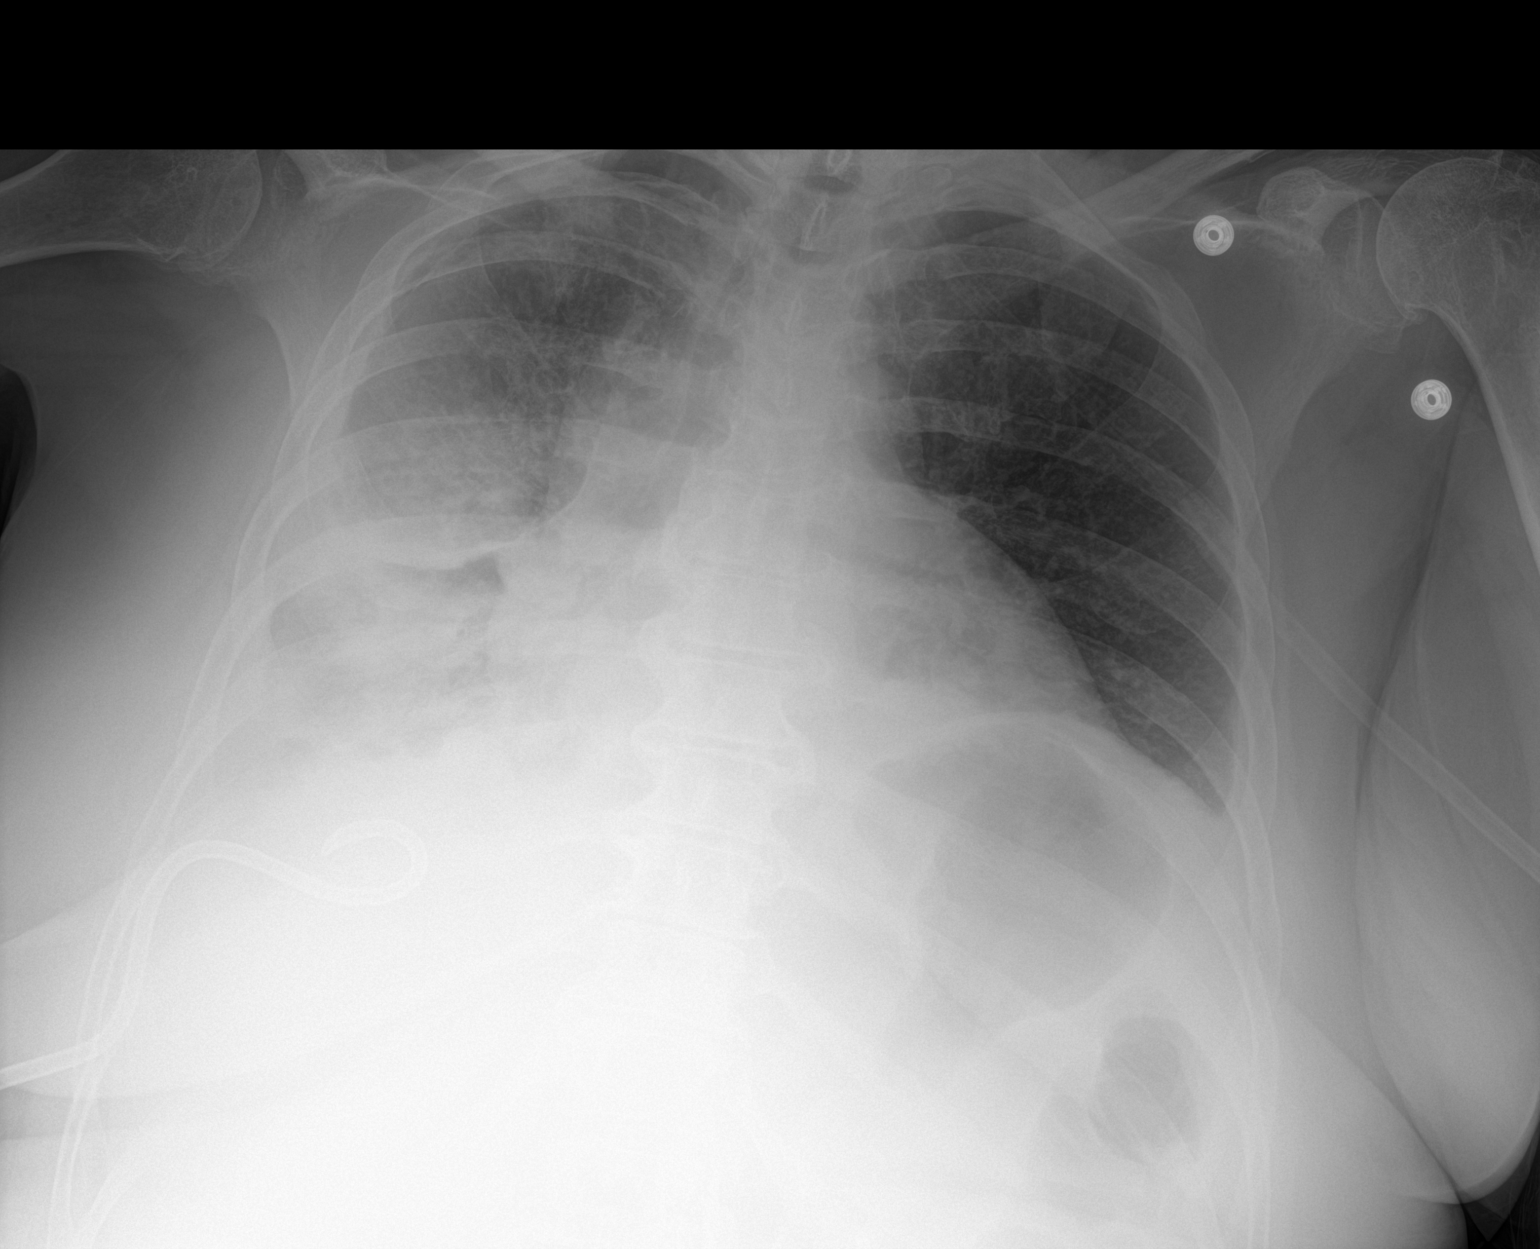

[1 of 1 positions shown; findings below may reference images not displayed]

FINDINGS: Right basilar pigtail chest tube unchanged. Interval development of
right pneumothorax approximately 10 mm.

Progression of airspace disease throughout the right lung. Mild to
moderate right pleural effusion with mild progression.

Mild left lower lobe atelectasis unchanged.  Small left effusion.
IMPRESSION: Interval development of right apical pneumothorax approximately 10
mm

Progression of right lower lobe airspace disease and right pleural
effusion.

## 2022-05-01 IMAGING — DX DG CHEST 1V PORT
1 series · 1 of 1 positions shown · non-contrast
Comparison: Chest x-ray 11/25/2020, CT chest 11/23/2020

CLINICAL DATA: Right pleural effusion

EXAM:
PORTABLE CHEST 1 VIEW

[chest]
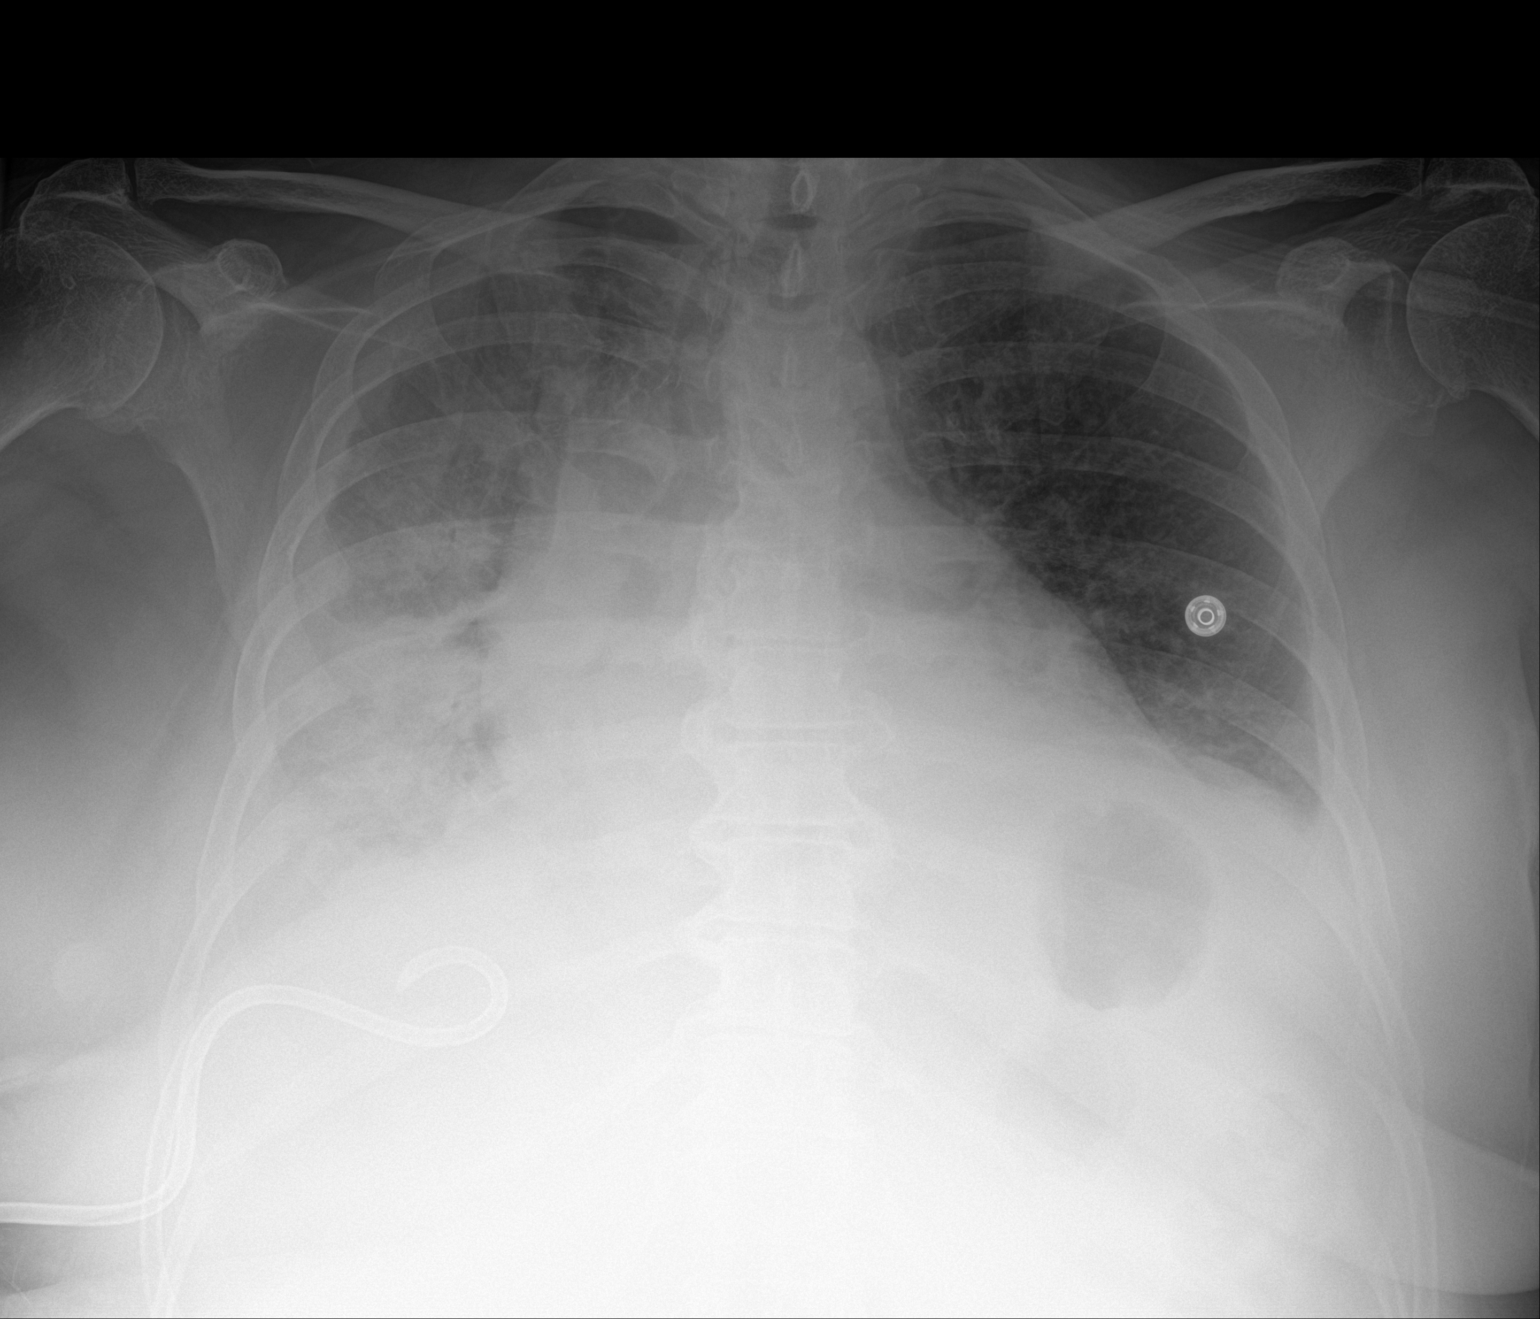

[1 of 1 positions shown; findings below may reference images not displayed]

FINDINGS: Right chest tube pigtail overlying the right base unchanged.

The heart size and mediastinal contours are unchanged.

Right lung airspace disease again noted. Left base streaky airspace
opacity likely representing atelectasis. Redemonstration of small
right apical pneumothorax as well as small to moderate right pleural
effusion. Suggestion of a trace to small volume left pleural
effusion. No pneumothorax on the left.

No acute osseous abnormality.
IMPRESSION: 1. Stable small right apical pneumothorax and small to moderate
right pleural effusion. Unchanged position of a right chest tube
pigtail. Persistent underlying right lung airspace disease. Followup
PA and lateral chest X-ray is recommended in 3-4 weeks to ensure
resolution and exclude underlying malignancy.
2. Persistent trace to small volume left pleural effusion.
Underlying atelectasis with superimposed infection/inflammation not
excluded.

## 2022-05-03 IMAGING — DX DG CHEST 1V PORT
1 series · 1 of 1 positions shown · non-contrast
Comparison: 11/28/2020

CLINICAL DATA: Chest tube removal.

EXAM:
PORTABLE CHEST 1 VIEW

[chest ap]
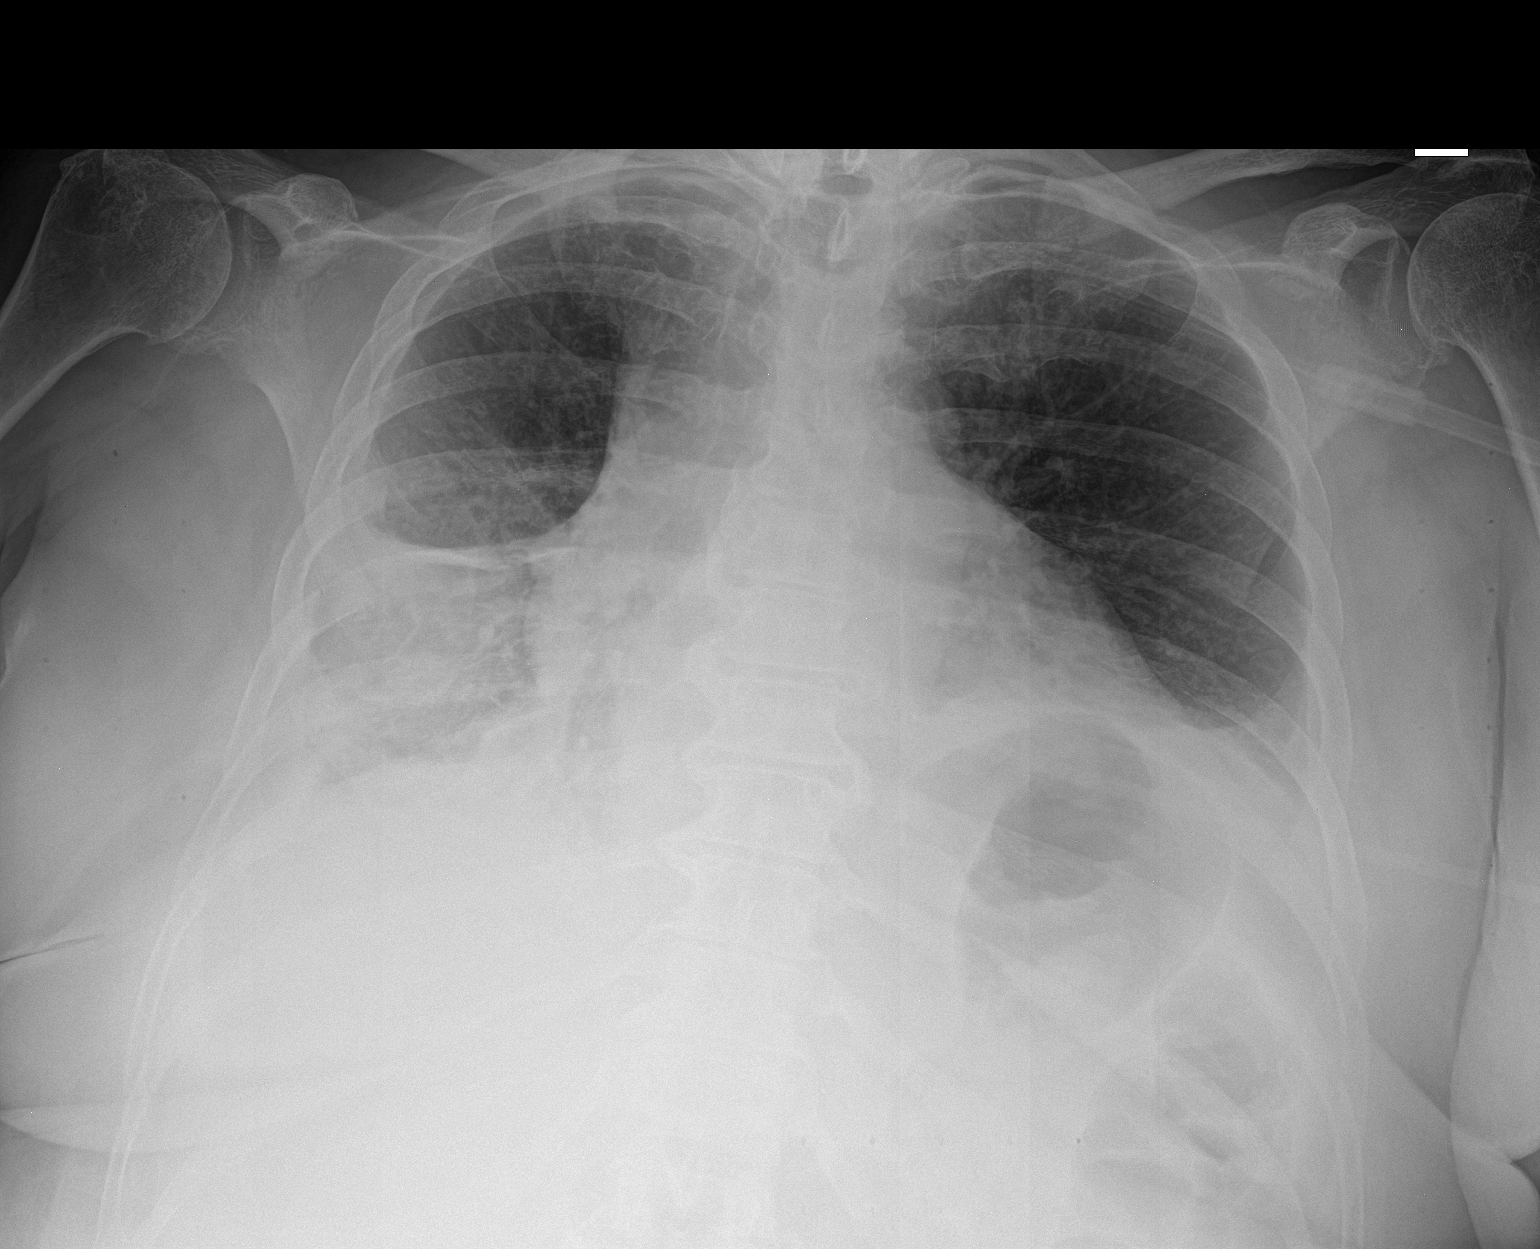

[1 of 1 positions shown; findings below may reference images not displayed]

FINDINGS: Right percutaneous pleural drain has been removed in the interval.
There is persistent right base consolidative opacity without
evidence for residual pneumothorax. Right pleural thickening is
similar to prior. Probable left basilar atelectasis with a small
left pleural effusion noted. Cardiopericardial silhouette is at
upper limits of normal for size. The visualized bony structures of
the thorax show no acute abnormality.
IMPRESSION: Right chest tube removal without evidence for pneumothorax.
